# Patient Record
Sex: Female | Born: 1945 | Race: White | Hispanic: No | Marital: Married | Smoking: Never smoker
Health system: Southern US, Community
[De-identification: ages and names within clinical notes are randomized; demographics above are authoritative.]

## PROBLEM LIST (undated history)

## (undated) DIAGNOSIS — M1990 Unspecified osteoarthritis, unspecified site: Secondary | ICD-10-CM

## (undated) DIAGNOSIS — F0390 Unspecified dementia without behavioral disturbance: Secondary | ICD-10-CM

## (undated) DIAGNOSIS — I5031 Acute diastolic (congestive) heart failure: Secondary | ICD-10-CM

## (undated) DIAGNOSIS — T7840XA Allergy, unspecified, initial encounter: Secondary | ICD-10-CM

## (undated) DIAGNOSIS — R600 Localized edema: Secondary | ICD-10-CM

## (undated) DIAGNOSIS — J309 Allergic rhinitis, unspecified: Secondary | ICD-10-CM

## (undated) DIAGNOSIS — I10 Essential (primary) hypertension: Secondary | ICD-10-CM

## (undated) DIAGNOSIS — E079 Disorder of thyroid, unspecified: Secondary | ICD-10-CM

## (undated) DIAGNOSIS — I351 Nonrheumatic aortic (valve) insufficiency: Secondary | ICD-10-CM

## (undated) DIAGNOSIS — J9602 Acute respiratory failure with hypercapnia: Principal | ICD-10-CM

## (undated) DIAGNOSIS — H40009 Preglaucoma, unspecified, unspecified eye: Secondary | ICD-10-CM

## (undated) DIAGNOSIS — J9601 Acute respiratory failure with hypoxia: Principal | ICD-10-CM

## (undated) DIAGNOSIS — E669 Obesity, unspecified: Secondary | ICD-10-CM

## (undated) DIAGNOSIS — E119 Type 2 diabetes mellitus without complications: Secondary | ICD-10-CM

## (undated) DIAGNOSIS — M25569 Pain in unspecified knee: Secondary | ICD-10-CM

## (undated) DIAGNOSIS — I359 Nonrheumatic aortic valve disorder, unspecified: Secondary | ICD-10-CM

## (undated) DIAGNOSIS — I509 Heart failure, unspecified: Secondary | ICD-10-CM

## (undated) DIAGNOSIS — I428 Other cardiomyopathies: Secondary | ICD-10-CM

## (undated) DIAGNOSIS — R7303 Prediabetes: Secondary | ICD-10-CM

## (undated) DIAGNOSIS — I739 Peripheral vascular disease, unspecified: Secondary | ICD-10-CM

## (undated) DIAGNOSIS — R0609 Other forms of dyspnea: Secondary | ICD-10-CM

## (undated) DIAGNOSIS — C801 Malignant (primary) neoplasm, unspecified: Secondary | ICD-10-CM

## (undated) HISTORY — DX: Nonrheumatic aortic (valve) insufficiency: I35.1

## (undated) HISTORY — DX: Type 2 diabetes mellitus without complications: E11.9

## (undated) HISTORY — DX: Unspecified dementia without behavioral disturbance: F03.90

## (undated) HISTORY — DX: Essential (primary) hypertension: I10

## (undated) HISTORY — DX: Acute respiratory failure with hypercapnia: J96.02

## (undated) HISTORY — DX: Allergy, unspecified, initial encounter: T78.40XA

## (undated) HISTORY — DX: Localized edema: R60.0

## (undated) HISTORY — DX: Disorder of thyroid, unspecified: E07.9

## (undated) HISTORY — DX: Nonrheumatic aortic valve disorder, unspecified: I35.9

## (undated) HISTORY — DX: Acute respiratory failure with hypoxia: J96.01

## (undated) HISTORY — DX: Allergic rhinitis, unspecified: J30.9

## (undated) HISTORY — DX: Other forms of dyspnea: R06.09

## (undated) HISTORY — DX: Heart failure, unspecified: I50.9

## (undated) HISTORY — DX: Obesity, unspecified: E66.9

## (undated) HISTORY — DX: Pain in unspecified knee: M25.569

## (undated) HISTORY — DX: Acute diastolic (congestive) heart failure: I50.31

## (undated) HISTORY — DX: Prediabetes: R73.03

## (undated) HISTORY — DX: Preglaucoma, unspecified, unspecified eye: H40.009

---

## 2001-06-12 ENCOUNTER — Other Ambulatory Visit: Admission: RE | Admit: 2001-06-12 | Discharge: 2001-06-12 | Payer: Self-pay | Admitting: Family Medicine

## 2005-01-03 ENCOUNTER — Ambulatory Visit: Payer: Self-pay | Admitting: Family Medicine

## 2005-02-15 ENCOUNTER — Ambulatory Visit: Payer: Self-pay | Admitting: Family Medicine

## 2005-02-22 ENCOUNTER — Ambulatory Visit: Payer: Self-pay | Admitting: Family Medicine

## 2005-02-22 ENCOUNTER — Other Ambulatory Visit: Admission: RE | Admit: 2005-02-22 | Discharge: 2005-02-22 | Payer: Self-pay | Admitting: Family Medicine

## 2005-02-26 ENCOUNTER — Ambulatory Visit: Payer: Self-pay

## 2005-03-06 ENCOUNTER — Ambulatory Visit: Payer: Self-pay

## 2006-04-17 ENCOUNTER — Ambulatory Visit: Payer: Self-pay | Admitting: Family Medicine

## 2006-04-24 ENCOUNTER — Encounter: Payer: Self-pay | Admitting: Family Medicine

## 2006-04-24 ENCOUNTER — Other Ambulatory Visit: Admission: RE | Admit: 2006-04-24 | Discharge: 2006-04-24 | Payer: Self-pay | Admitting: Family Medicine

## 2006-04-24 ENCOUNTER — Ambulatory Visit: Payer: Self-pay | Admitting: Family Medicine

## 2007-06-24 ENCOUNTER — Ambulatory Visit: Payer: Self-pay | Admitting: Family Medicine

## 2007-06-24 LAB — CONVERTED CEMR LAB
BUN: 14 mg/dL (ref 6–23)
Basophils Relative: 0.2 % (ref 0.0–1.0)
Bilirubin Urine: NEGATIVE
Bilirubin, Direct: 0.2 mg/dL (ref 0.0–0.3)
CO2: 30 meq/L (ref 19–32)
Cholesterol: 176 mg/dL (ref 0–200)
Creatinine,U: 63.5 mg/dL
GFR calc Af Amer: 109 mL/min
Glucose, Bld: 122 mg/dL — ABNORMAL HIGH (ref 70–99)
HDL: 37 mg/dL — ABNORMAL LOW (ref >39.0)
Hemoglobin: 14.3 g/dL (ref 12.0–15.0)
Ketones, urine, test strip: NEGATIVE
Lymphocytes Relative: 21.6 % (ref 12.0–46.0)
Microalb Creat Ratio: 3.1 mg/g (ref 0.0–30.0)
Microalb, Ur: 0.2 mg/dL (ref 0.0–1.9)
Monocytes Absolute: 0.4 10*3/uL (ref 0.2–0.7)
Monocytes Relative: 8.7 % (ref 3.0–11.0)
Neutro Abs: 3.3 10*3/uL (ref 1.4–7.7)
Neutrophils Relative %: 65.8 % (ref 43.0–77.0)
Nitrite: NEGATIVE
Potassium: 4.1 meq/L (ref 3.5–5.1)
Sodium: 145 meq/L (ref 135–145)
Total Protein: 6.9 g/dL (ref 6.0–8.3)
VLDL: 30 mg/dL (ref 0–40)
pH: 5.5

## 2007-07-01 ENCOUNTER — Encounter: Payer: Self-pay | Admitting: Family Medicine

## 2007-07-01 ENCOUNTER — Other Ambulatory Visit: Admission: RE | Admit: 2007-07-01 | Discharge: 2007-07-01 | Payer: Self-pay | Admitting: Family Medicine

## 2007-07-01 ENCOUNTER — Ambulatory Visit: Payer: Self-pay | Admitting: Family Medicine

## 2007-07-01 DIAGNOSIS — I10 Essential (primary) hypertension: Secondary | ICD-10-CM

## 2007-07-01 DIAGNOSIS — J309 Allergic rhinitis, unspecified: Secondary | ICD-10-CM | POA: Insufficient documentation

## 2007-07-01 DIAGNOSIS — E669 Obesity, unspecified: Secondary | ICD-10-CM

## 2007-07-01 DIAGNOSIS — I359 Nonrheumatic aortic valve disorder, unspecified: Secondary | ICD-10-CM

## 2007-07-01 DIAGNOSIS — E0590 Thyrotoxicosis, unspecified without thyrotoxic crisis or storm: Secondary | ICD-10-CM | POA: Insufficient documentation

## 2007-07-01 HISTORY — DX: Nonrheumatic aortic valve disorder, unspecified: I35.9

## 2007-07-01 HISTORY — DX: Obesity, unspecified: E66.9

## 2007-07-01 HISTORY — DX: Essential (primary) hypertension: I10

## 2007-07-01 HISTORY — DX: Allergic rhinitis, unspecified: J30.9

## 2007-07-03 ENCOUNTER — Telehealth: Payer: Self-pay | Admitting: Family Medicine

## 2009-01-04 ENCOUNTER — Telehealth: Payer: Self-pay | Admitting: Family Medicine

## 2009-02-08 ENCOUNTER — Ambulatory Visit: Payer: Self-pay | Admitting: Family Medicine

## 2009-02-08 LAB — CONVERTED CEMR LAB
ALT: 17 units/L (ref 0–35)
AST: 22 units/L (ref 0–37)
Alkaline Phosphatase: 74 units/L (ref 39–117)
BUN: 13 mg/dL (ref 6–23)
Basophils Absolute: 0 10*3/uL (ref 0.0–0.1)
Blood in Urine, dipstick: NEGATIVE
Calcium: 9.2 mg/dL (ref 8.4–10.5)
Creatinine,U: 37 mg/dL
Eosinophils Relative: 3.3 % (ref 0.0–5.0)
GFR calc non Af Amer: 106.93 mL/min (ref >60)
HCT: 39.5 % (ref 36.0–46.0)
HDL: 43.8 mg/dL (ref >39.00)
Hemoglobin: 13.9 g/dL (ref 12.0–15.0)
LDL Cholesterol: 115 mg/dL — ABNORMAL HIGH (ref 0–99)
Lymphocytes Relative: 22.6 % (ref 12.0–46.0)
Lymphs Abs: 1 10*3/uL (ref 0.7–4.0)
Microalb Creat Ratio: 8.1 mg/g (ref 0.0–30.0)
Monocytes Relative: 7.9 % (ref 3.0–12.0)
Platelets: 156 10*3/uL (ref 150.0–400.0)
Potassium: 4 meq/L (ref 3.5–5.1)
Protein, U semiquant: NEGATIVE
RDW: 12.4 % (ref 11.5–14.6)
Sodium: 142 meq/L (ref 135–145)
TSH: 0.98 microintl units/mL (ref 0.35–5.50)
Total Bilirubin: 0.8 mg/dL (ref 0.3–1.2)
Urobilinogen, UA: 0.2
VLDL: 20.6 mg/dL (ref 0.0–40.0)
WBC: 4.4 10*3/uL — ABNORMAL LOW (ref 4.5–10.5)

## 2009-02-17 ENCOUNTER — Ambulatory Visit: Payer: Self-pay | Admitting: Family Medicine

## 2009-02-17 ENCOUNTER — Encounter: Payer: Self-pay | Admitting: Family Medicine

## 2009-02-17 ENCOUNTER — Other Ambulatory Visit: Admission: RE | Admit: 2009-02-17 | Discharge: 2009-02-17 | Payer: Self-pay | Admitting: Family Medicine

## 2010-03-09 ENCOUNTER — Telehealth: Payer: Self-pay | Admitting: Family Medicine

## 2010-04-06 ENCOUNTER — Other Ambulatory Visit: Admission: RE | Admit: 2010-04-06 | Discharge: 2010-04-06 | Payer: Self-pay | Admitting: Family Medicine

## 2010-04-06 ENCOUNTER — Ambulatory Visit: Payer: Self-pay | Admitting: Family Medicine

## 2010-04-06 DIAGNOSIS — M25569 Pain in unspecified knee: Secondary | ICD-10-CM

## 2010-04-06 HISTORY — DX: Pain in unspecified knee: M25.569

## 2010-09-16 LAB — CONVERTED CEMR LAB
Albumin: 4.6 g/dL (ref 3.5–5.2)
Alkaline Phosphatase: 73 units/L (ref 39–117)
BUN: 14 mg/dL (ref 6–23)
Basophils Absolute: 0 10*3/uL (ref 0.0–0.1)
CO2: 29 meq/L (ref 19–32)
Calcium: 9.9 mg/dL (ref 8.4–10.5)
Cholesterol: 187 mg/dL (ref 0–200)
Creatinine, Ser: 0.6 mg/dL (ref 0.4–1.2)
Eosinophils Absolute: 0 10*3/uL (ref 0.0–0.7)
GFR calc non Af Amer: 106.54 mL/min (ref >60)
Glucose, Bld: 122 mg/dL — ABNORMAL HIGH (ref 70–99)
HDL: 42.5 mg/dL (ref >39.00)
Hemoglobin: 15.1 g/dL — ABNORMAL HIGH (ref 12.0–15.0)
Lymphocytes Relative: 14.6 % (ref 12.0–46.0)
MCHC: 35.2 g/dL (ref 30.0–36.0)
Monocytes Relative: 7.5 % (ref 3.0–12.0)
Neutro Abs: 5.1 10*3/uL (ref 1.4–7.7)
Platelets: 204 10*3/uL (ref 150.0–400.0)
RDW: 13.3 % (ref 11.5–14.6)
TSH: 0.99 microintl units/mL (ref 0.35–5.50)
Total Bilirubin: 0.7 mg/dL (ref 0.3–1.2)
Triglycerides: 176 mg/dL — ABNORMAL HIGH (ref 0.0–149.0)
VLDL: 35.2 mg/dL (ref 0.0–40.0)

## 2010-09-18 NOTE — Assessment & Plan Note (Signed)
Summary: emp/pt fasting/cjr   Vital Signs:  Patient profile:   65 year old female Menstrual status:  postmenopausal Height:      63 inches Weight:      198 pounds BMI:     35.20 Temp:     98.4 degrees F oral BP sitting:   170 / 100  (left arm) Cuff size:   regular  Vitals Entered By: Kern Reap CMA Duncan Dull) (April 06, 2010 10:36 AM) CC: cpx Is Patient Diabetic? No Pain Assessment Patient in pain? no        CC:  cpx.  History of Present Illness: Charlene Rubio is a 65 year old, married female, nonsmoker, who comes in today for evaluation of multiple issues.  She has underlying hypertension, for which she takes enalapril 80 mg daily, hydrochlorothiazide, 25 mg daily.  She also takes an 81-mg baby aspirin.it's very difficult to get a coherent history.  She keeps jumping from one topic to the next  She takes Claritin 10 mg daily for allergic rhinitis and uses Premarin vaginal cream twice weekly for vaginal dryness.  Tetanus 2001.  Booster today....... decline seasonal flu shot, Pneumovax 2010, colonoscopy 2005.  She declines any further screening colonoscopies because the friend had a perforation.  She gets routine eye care.  Dental care.  Checks her breasts monthly, but declines any mammography.  Last mammogram 2002.  She is now having trouble with swelling of her left leg more than her right. she states that she fell 6 weeks ago on a piece of concrete and injured her knee.  No history of DVT.  Her husband is also concerned about her hearing.  She declines any hearing evaluation.  BP at home 125/75.  BP here 170 over hundred.  She states she hates to coming here.  She gets nervous and her blood pressure goes up.  Here for Medicare AWV:  1.   Risk factors based on Past M, S, F history:reviewed.  No change except for noted above 2.   Physical Activities: ...Marland KitchenMarland KitchenMarland Kitchenno routine exercise 3.   Depression/mood: ........mood good.  No depression 4.   Hearing: .........Marland Kitchenhusband says she can hear and  she declines.  Hearing evaluation 5.   ADL's: .......unchanged 6.   Fall Risk: reviewed none 7.   Home Safety: reviewed normal 8.   Height, weight, &visual acuity:height weight, normal 9.   Counseling: diet, exercise, weight loss audiogram.  Annual mammography 10.   Labs ordered based on risk factors: done today 11.           Referral Coordination.........none indicated 12.           Care Plan......Marland Kitchenreviewed in detail 13.            Cognitive Assessment .......normal mentation  Allergies: No Known Drug Allergies  Past History:  Past medical, surgical, family and social histories (including risk factors) reviewed, and no changes noted (except as noted below).  Past Medical History: Reviewed history from 07/01/2007 and no changes required. Allergic rhinitis Hyperthyroidism aortic insufficiency obesity postmenopausal vaginal dryness childbirth x 3  Family History: Reviewed history from 07/01/2007 and no changes required. Family History of CAD Female 1st degree relative <60 Family History Hypertension  Social History: Reviewed history from 07/01/2007 and no changes required. Married Never Smoked Alcohol use-no Drug use-no Regular exercise-no  Review of Systems      See HPI  Physical Exam  General:  Well-developed,well-nourished,in no acute distress; alert,appropriate and cooperative throughout examination Head:  Normocephalic and atraumatic without obvious abnormalities. No  apparent alopecia or balding. Eyes:  No corneal or conjunctival inflammation noted. EOMI. Perrla. Funduscopic exam benign, without hemorrhages, exudates or papilledema. Vision grossly normal. Ears:  External ear exam shows no significant lesions or deformities.  Otoscopic examination reveals clear canals, tympanic membranes are intact bilaterally without bulging, retraction, inflammation or discharge. Hearing is grossly normal bilaterally. Nose:  External nasal examination shows no deformity or  inflammation. Nasal mucosa are pink and moist without lesions or exudates. Mouth:  Oral mucosa and oropharynx without lesions or exudates.  Teeth in good repair. Neck:  No deformities, masses, or tenderness noted. Chest Wall:  No deformities, masses, or tenderness noted. Breasts:  No mass, nodules, thickening, tenderness, bulging, retraction, inflamation, nipple discharge or skin changes noted.   Lungs:  cardiac exam negative, except for slight AI murmur, grade 2 Heart:  Normal rate and regular rhythm. S1 and S2 normal without gallop, murmur, click, rub or other extra sounds. Abdomen:  Bowel sounds positive,abdomen soft and non-tender without masses, organomegaly or hernias noted. Rectal:  No external abnormalities noted. Normal sphincter tone. No rectal masses or tenderness. Genitalia:  Pelvic Exam:        External: normal female genitalia without lesions or masses        Vagina: normal without lesions or masses        Cervix: normal without lesions or masses        Adnexa: normal bimanual exam without masses or fullness        Uterus: normal by palpation        Pap smear: performed Msk:  No deformity or scoliosis noted of thoracic or lumbar spine.   Pulses:  arterial pulses normal.  There is 2+ edema left leg, trace in the right.  Unable to bend her left knee Extremities:  No clubbing, cyanosis, edema, or deformity noted with normal full range of motion of all joints.   Neurologic:  No cranial nerve deficits noted. Station and gait are normal. Plantar reflexes are down-going bilaterally. DTRs are symmetrical throughout. Sensory, motor and coordinative functions appear intact. Skin:  Intact without suspicious lesions or rashes Cervical Nodes:  No lymphadenopathy noted Axillary Nodes:  No palpable lymphadenopathy Inguinal Nodes:  No significant adenopathy Psych:  difficulty to get a coherent history.  She keeps jumping from one topic to the neck   Impression & Recommendations:  Problem #  1:  OBESITY, UNSPECIFIED (ICD-278.00) Assessment Unchanged  Orders: Venipuncture (14782) TLB-Lipid Panel (80061-LIPID) TLB-BMP (Basic Metabolic Panel-BMET) (80048-METABOL) TLB-CBC Platelet - w/Differential (85025-CBCD) TLB-Hepatic/Liver Function Pnl (80076-HEPATIC) TLB-TSH (Thyroid Stimulating Hormone) (95621-HYQ) Prescription Created Electronically [phone]) Medicare -1st Annual Wellness Visit [phone]) Urinalysis-dipstick only (Medicare patient) (52841LK)  Problem # 2:  HYPERTENSION NEC (ICD-997.91) Assessment: Improved  Orders: Venipuncture (44010) TLB-Lipid Panel (80061-LIPID) TLB-BMP (Basic Metabolic Panel-BMET) (80048-METABOL) TLB-CBC Platelet - w/Differential (85025-CBCD) TLB-Hepatic/Liver Function Pnl (80076-HEPATIC) TLB-TSH (Thyroid Stimulating Hormone) (27253-GUY) Prescription Created Electronically [phone]) Medicare -1st Annual Wellness Visit [phone]) Urinalysis-dipstick only (Medicare patient) (63875IE)  Problem # 3:  AORTIC INSUFFICIENCY (ICD-424.1) Assessment: Unchanged  Her updated medication list for this problem includes:    Aspir-low 81 Mg Tbec (Aspirin) ..... Once daily  Orders: Venipuncture (33295) TLB-Lipid Panel (80061-LIPID) TLB-BMP (Basic Metabolic Panel-BMET) (80048-METABOL) TLB-CBC Platelet - w/Differential (85025-CBCD) TLB-Hepatic/Liver Function Pnl (80076-HEPATIC) TLB-TSH (Thyroid Stimulating Hormone) (18841-YSA) Prescription Created Electronically [phone]) Medicare -1st Annual Wellness Visit [phone]) Urinalysis-dipstick only (Medicare patient) ([id number])  Problem # 4:  PHYSICAL EXAMINATION (ICD-V70.0) Assessment: Unchanged  Orders: Venipuncture (20254) TLB-Lipid Panel (80061-LIPID) TLB-BMP (Basic Metabolic Panel-BMET) (80048-METABOL)  TLB-CBC Platelet - w/Differential (85025-CBCD) TLB-Hepatic/Liver Function Pnl (80076-HEPATIC) TLB-TSH (Thyroid Stimulating Hormone) ([phone]) Prescription Created Electronically [phone]) Medicare -1st Annual  Wellness Visit [phone]) Urinalysis-dipstick only (Medicare patient) (78295AO)  Problem # 5:  KNEE PAIN, LEFT (ICD-719.46) Assessment: New  Her updated medication list for this problem includes:    Aspir-low 81 Mg Tbec (Aspirin) ..... Once daily  Orders: Venipuncture (13086) TLB-Lipid Panel (80061-LIPID) TLB-BMP (Basic Metabolic Panel-BMET) (80048-METABOL) TLB-CBC Platelet - w/Differential (85025-CBCD) TLB-Hepatic/Liver Function Pnl (80076-HEPATIC) TLB-TSH (Thyroid Stimulating Hormone) (57846-NGE) Prescription Created Electronically [phone]) Medicare -1st Annual Wellness Visit [phone]) Urinalysis-dipstick only (Medicare patient) (01027OZ)  Complete Medication List: 1)  Quinapril Hcl 40 Mg Tabs (Quinapril hcl) .... 2 by mouth qam 2)  Hydrochlorothiazide 25 Mg Tabs (Hydrochlorothiazide) .... Once daily 3)  Premarin 0.625 Mg/gm Crea (Estrogens, conjugated) .... Q wk 4)  Aspir-low 81 Mg Tbec (Aspirin) .... Once daily 5)  Claritin 10 Mg Tabs (Loratadine) .... Once daily as needed  Patient Instructions: 1)  call today to set up for your mammogram. 2)  Call Dr. Derrill Kay for evaluation of your left knee. 3)  Return to see me in one month for follow-up. 4)  Check your blood pressure weekly. 5)  Walk 30 minutes daily 6)  Take an Aspirin every day. Prescriptions: PREMARIN 0.625 MG/GM  CREA (ESTROGENS, CONJUGATED) Q WK  #3 tubes x 4   Entered and Authorized by:   Roderick Pee MD   Signed by:   Roderick Pee MD on 04/06/2010   Method used:   Electronically to        [facility]. [phone]* (retail)       [address]       [place], [place]  [address]       Ph: [phone]       Fax: [phone]   RxID:   [id number] HYDROCHLOROTHIAZIDE 25 MG  TABS (HYDROCHLOROTHIAZIDE) once daily  #100 x 3   Entered and Authorized by:   Roderick Pee MD   Signed by:   Roderick Pee MD on 04/06/2010   Method used:   Electronically to        [facility]. [phone]* (retail)       [address]       [place], [place]  [address]       Ph: [phone]       Fax: [phone]   RxID:   [id number] QUINAPRIL HCL 40 MG  TABS (QUINAPRIL HCL) 2 by mouth qam  #200 x 3   Entered and Authorized by:   Roderick Pee MD   Signed by:   Roderick Pee MD on 04/06/2010   Method used:   Electronically to        [facility]. [phone]* (retail)       [address]       [place], [place]  [address]       Ph: [phone]       Fax: [phone]   RxID:   [id number]     Appended Document: Orders Update    Clinical Lists Changes  Orders: Added new Service order of Venipuncture (44315) - Signed Added new Service order of Specimen Handling (40086) - Signed      Appended Document: emp/pt fasting/cjr     Allergies: No Known Drug Allergies  Complete Medication List: 1)  Quinapril Hcl 40 Mg Tabs (Quinapril hcl) .... 2 by mouth qam 2)  Hydrochlorothiazide 25 Mg Tabs (Hydrochlorothiazide) .... Once daily 3)  Premarin 0.625 Mg/gm Crea (Estrogens, conjugated) .... Q wk 4)  Aspir-low 81 Mg Tbec (Aspirin) .... Once daily 5)  Claritin 10 Mg Tabs (Loratadine) .... Once daily as needed  Other Orders: Tdap => 13yrs IM (16109) Admin 1st Vaccine (60454) EKG w/ Interpretation (93000)    Immunizations Administered:  Tetanus Vaccine:    Vaccine Type: Tdap    Site: right deltoid    Mfr: GlaxoSmithKline    Dose: 0.5 ml    Route: IM    Given by: Kern Reap CMA (AAMA)    Exp. Date: 06/07/2012    Lot #: [id number]    VIS given: 07/07/07 version given April 06, 2010.    Physician counseled: yes  Appended Document: emp/pt fasting/cjr     Allergies: No Known Drug Allergies   Complete Medication List: 1)  Quinapril Hcl 40 Mg Tabs (Quinapril hcl) .... 2 by mouth qam 2)  Hydrochlorothiazide 25 Mg Tabs (Hydrochlorothiazide) .... Once daily 3)  Premarin 0.625  Mg/gm Crea (Estrogens, conjugated) .... Q wk 4)  Aspir-low 81 Mg Tbec (Aspirin) .... Once daily 5)  Claritin 10 Mg Tabs (Loratadine) .... Once daily as needed   Laboratory Results   Urine Tests  Date/Time Received: April 06, 2010   Routine Urinalysis   Color: yellow Appearance: Clear Glucose: negative   (Normal Range: Negative) Bilirubin: negative   (Normal Range: Negative) Ketone: negative   (Normal Range: Negative) Spec. Gravity: 1.015   (Normal Range: 1.003-1.035) Blood: trace-lysed   (Normal Range: Negative) pH: 5.0   (Normal Range: 5.0-8.0) Protein: negative   (Normal Range: Negative) Urobilinogen: 0.2   (Normal Range: 0-1) Nitrite: negative   (Normal Range: Negative) Leukocyte Esterace: moderate   (Normal Range: Negative)    Comments: Kern Reap CMA Duncan Dull)  April 06, 2010 5:12 PM

## 2010-09-18 NOTE — Progress Notes (Signed)
Summary: Pt sch cpx for 04/06/10. Req partial refill Quinapril and HCTZ  Phone Note Call from Patient Call back at Home Phone [phone]   Caller: Patient Summary of Call: Pt has a cpx sch for 04/06/10 at 10:30am and is req partial refill of Quinapril 50mg  and HCTZ 25mg , to last until her appt date. Pls call in partial to [facility] [address]. Pls do this today. Pt out of meds.     Initial call taken by: Lucy Antigua,  March 09, 2010 10:39 AM    Prescriptions: HYDROCHLOROTHIAZIDE 25 MG  TABS (HYDROCHLOROTHIAZIDE) once daily  #30 x 0   Entered by:   Kern Reap CMA (AAMA)   Authorized by:   Roderick Pee MD   Signed by:   Kern Reap CMA (AAMA) on 03/09/2010   Method used:   Electronically to        [facility] [phone]* (retail)       [address]       [place], [place]  [address]       Ph: [phone]       Fax: [phone]   RxID:   [id number] QUINAPRIL HCL 40 MG  TABS (QUINAPRIL HCL) 2 by mouth qam  #60 x 0   Entered by:   Kern Reap CMA (AAMA)   Authorized by:   Roderick Pee MD   Signed by:   Kern Reap CMA (AAMA) on 03/09/2010   Method used:   Electronically to        [facility] [phone]* (retail)       [address]       [place], [place]  [address]       Ph: [phone]       Fax: [phone]   RxID:   [id number]

## 2011-06-09 ENCOUNTER — Other Ambulatory Visit: Payer: Self-pay | Admitting: Family Medicine

## 2011-06-10 ENCOUNTER — Telehealth: Payer: Self-pay | Admitting: Family Medicine

## 2011-06-10 MED ORDER — QUINAPRIL HCL 40 MG PO TABS: 40.0000 mg | ORAL_TABLET | Freq: Every day | ORAL | Status: DC

## 2011-06-10 MED ORDER — HYDROCHLOROTHIAZIDE 25 MG PO TABS: 25.0000 mg | ORAL_TABLET | Freq: Every day | ORAL | Status: DC

## 2011-06-10 NOTE — Telephone Encounter (Signed)
Pt is scheduled for her physical on 08/19/11 and will be out of meds before then. Pt requesting refill on quinapril (ACCUPRIL) 40 MG tablet and hydrochlorothiazide (HYDRODIURIL) 25 MG tablet.  [facility] and [address]

## 2011-08-08 ENCOUNTER — Encounter: Payer: Self-pay | Admitting: Family Medicine

## 2011-08-16 ENCOUNTER — Encounter: Payer: Self-pay | Admitting: Family Medicine

## 2011-08-19 ENCOUNTER — Ambulatory Visit (INDEPENDENT_AMBULATORY_CARE_PROVIDER_SITE_OTHER): Payer: Medicare Other | Admitting: Family Medicine

## 2011-08-19 ENCOUNTER — Other Ambulatory Visit (HOSPITAL_COMMUNITY)
Admission: RE | Admit: 2011-08-19 | Discharge: 2011-08-19 | Disposition: A | Discharge status: Home / Self Care | Payer: Medicare Other | Source: Ambulatory Visit | Attending: Family Medicine | Admitting: Family Medicine

## 2011-08-19 ENCOUNTER — Encounter: Payer: Self-pay | Admitting: Family Medicine

## 2011-08-19 DIAGNOSIS — IMO0002 Reserved for concepts with insufficient information to code with codable children: Secondary | ICD-10-CM

## 2011-08-19 DIAGNOSIS — J309 Allergic rhinitis, unspecified: Secondary | ICD-10-CM

## 2011-08-19 DIAGNOSIS — Z0000 Encounter for general adult medical examination without abnormal findings: Secondary | ICD-10-CM

## 2011-08-19 DIAGNOSIS — Z23 Encounter for immunization: Secondary | ICD-10-CM

## 2011-08-19 DIAGNOSIS — Z01419 Encounter for gynecological examination (general) (routine) without abnormal findings: Secondary | ICD-10-CM | POA: Insufficient documentation

## 2011-08-19 DIAGNOSIS — I10 Essential (primary) hypertension: Secondary | ICD-10-CM

## 2011-08-19 DIAGNOSIS — E0590 Thyrotoxicosis, unspecified without thyrotoxic crisis or storm: Secondary | ICD-10-CM

## 2011-08-19 DIAGNOSIS — Z136 Encounter for screening for cardiovascular disorders: Secondary | ICD-10-CM

## 2011-08-19 LAB — TSH: TSH: 1.45 u[IU]/mL (ref 0.35–5.50)

## 2011-08-19 LAB — POCT URINALYSIS DIPSTICK
Glucose, UA: NEGATIVE
Ketones, UA: NEGATIVE
Protein, UA: NEGATIVE
Urobilinogen, UA: 0.2

## 2011-08-19 LAB — CBC WITH DIFFERENTIAL/PLATELET
Basophils Relative: 0.7 % (ref 0.0–3.0)
Eosinophils Absolute: 0.1 10*3/uL (ref 0.0–0.7)
Eosinophils Relative: 1.8 % (ref 0.0–5.0)
Lymphocytes Relative: 18 % (ref 12.0–46.0)
MCV: 93 fl (ref 78.0–100.0)
Monocytes Absolute: 0.4 10*3/uL (ref 0.1–1.0)
Neutrophils Relative %: 72 % (ref 43.0–77.0)
Platelets: 200 10*3/uL (ref 150.0–400.0)
RBC: 4.75 Mil/uL (ref 3.87–5.11)
WBC: 5.9 10*3/uL (ref 4.5–10.5)

## 2011-08-19 LAB — BASIC METABOLIC PANEL
BUN: 21 mg/dL (ref 6–23)
CO2: 24 mEq/L (ref 19–32)
Chloride: 103 mEq/L (ref 96–112)
Creatinine, Ser: 0.7 mg/dL (ref 0.4–1.2)
Potassium: 3.6 mEq/L (ref 3.5–5.1)

## 2011-08-19 LAB — HEPATIC FUNCTION PANEL
ALT: 19 U/L (ref 0–35)
AST: 23 U/L (ref 0–37)
Total Bilirubin: 0.7 mg/dL (ref 0.3–1.2)
Total Protein: 7.8 g/dL (ref 6.0–8.3)

## 2011-08-19 LAB — LIPID PANEL
Cholesterol: 193 mg/dL (ref 0–200)
HDL: 49 mg/dL (ref >39.00)
Triglycerides: 127 mg/dL (ref 0.0–149.0)
VLDL: 25.4 mg/dL (ref 0.0–40.0)

## 2011-08-19 MED ORDER — HYDROCHLOROTHIAZIDE 25 MG PO TABS: 25.0000 mg | ORAL_TABLET | Freq: Every day | ORAL | Status: DC

## 2011-08-19 MED ORDER — ESTROGENS, CONJUGATED 0.625 MG/GM VA CREA: 1.0000 g | TOPICAL_CREAM | Freq: Every day | VAGINAL | Status: DC

## 2011-08-19 MED ORDER — QUINAPRIL HCL 40 MG PO TABS: 40.0000 mg | ORAL_TABLET | Freq: Every day | ORAL | Status: DC

## 2011-08-19 NOTE — Patient Instructions (Signed)
Continue your current medications.  Check your blood pressure daily in the morning and return in two weeks with the data and the device.  Remember to stay on a salt free diet and walk 30 minutes daily

## 2011-08-19 NOTE — Progress Notes (Signed)
Subjective:    Patient ID: Charlene Rubio, female    DOB: [birth year], 65 y.o.   MRN: [id number]  HPI Jakaylah is a 65 year old, married female, nonsmoker, who comes in today for a Medicare wellness examination because of a history of hypertension, allergic rhinitis postmenopausal vaginal dryness, and a number of other issues.  Her blood pressure today is 200 over hundred.  Her pulse is a hundred and regular.  She states that when she comes in the office.  She gets nervous.  Her blood pressure goes up and her blood pressure at home is normal.  I then asked her what her blood pressure was at home.  She says she hasn't checked it in over a year.  She states she takes her medications on a regular basis.  She uses Premarin vaginal cream once or twice monthly for vaginal dryness.  Her antihypertensive medications are Hyzaar thiazide 25 mg daily, Accupril 40 mg daily.  She takes Claritin OTC for allergic rhinitis and a baby aspirin.  She states she has soreness on left side of her face and she had dental work done a year ago.  Referred back to her dentist for reevaluation.  Seasonal flu shot today, Pneumovax 2010, tetanus, 2011, information given on shingles.  She does not check her breasts on a monthly basis as and a mammogram in over 10 years.  She states she does not get mammograms because she doesn't feel lump.  I explained to her that screening mammography.  Will find breast cancer before, you can feel it and that we still recommend a yearly mammogram.  It is very difficult to try to get her history because when we ask her a question she Jumps  from one topic to another  She does not get regular eye care, states her hearing is normal, gets regular dental care, activities of daily living, sedentary.  She does not exercise on a regular basis.  Cognitive function and her husband does all her finances.  Home health safety reviewed.  New issues identified, no guns in the house, she does not have a  healthcare power of attorney.  No living will.  I suggested that they do that   Review of Systems  Constitutional: Negative.   HENT: Negative.   Eyes: Negative.   Respiratory: Negative.   Cardiovascular: Negative.   Gastrointestinal: Negative.   Genitourinary: Negative.   Musculoskeletal: Negative.   Neurological: Negative.   Hematological: Negative.   Psychiatric/Behavioral: Negative.        Objective:   Physical Exam  Constitutional: She appears well-developed and well-nourished.  HENT:  Head: Normocephalic and atraumatic.  Right Ear: External ear normal.  Left Ear: External ear normal.  Nose: Nose normal.  Mouth/Throat: Oropharynx is clear and moist.  Eyes: EOM are normal. Pupils are equal, round, and reactive to light.  Neck: Normal range of motion. Neck supple. No thyromegaly present.  Cardiovascular: Normal rate, regular rhythm, normal heart sounds and intact distal pulses.  Exam reveals no gallop and no friction rub.   No murmur heard. Pulmonary/Chest: Effort normal and breath sounds normal.  Abdominal: Soft. Bowel sounds are normal. She exhibits no distension and no mass. There is no tenderness. There is no rebound.  Genitourinary: Vagina normal and uterus normal. Guaiac negative stool. No vaginal discharge found.  Musculoskeletal: Normal range of motion.  Lymphadenopathy:    She has no cervical adenopathy.  Neurological: She is alert. She has normal reflexes. No cranial nerve deficit. She exhibits normal muscle tone.  Coordination normal.  Skin: Skin is warm and dry.  Psychiatric: She has a normal mood and affect. Her behavior is normal. Judgment and thought content normal.       Difficult to obtain a history because she jumps from one topic to another          Assessment & Plan:  Healthy female.  Hypertension, not at goal continue current medication.  BP check.  Q.a.m. Follow-up in two weeks.  Allergic rhinitis.  Continue current  medication.  Postmenopausal vaginal dryness.  Use the Premarin vaginal cream twice weekly.  I recommend she get routine eye care, we do see her dentist, get a mammogram on her regular basis, begin an exercise program

## 2011-10-30 ENCOUNTER — Other Ambulatory Visit (INDEPENDENT_AMBULATORY_CARE_PROVIDER_SITE_OTHER): Payer: Medicare Other

## 2011-10-30 DIAGNOSIS — E119 Type 2 diabetes mellitus without complications: Secondary | ICD-10-CM

## 2011-10-30 LAB — BASIC METABOLIC PANEL
BUN: 15 mg/dL (ref 6–23)
CO2: 27 mEq/L (ref 19–32)
Calcium: 10 mg/dL (ref 8.4–10.5)
GFR: 81.96 mL/min (ref >60.00)
Glucose, Bld: 156 mg/dL — ABNORMAL HIGH (ref 70–99)
Potassium: 3.5 mEq/L (ref 3.5–5.1)

## 2012-08-07 ENCOUNTER — Other Ambulatory Visit: Payer: Self-pay | Admitting: *Deleted

## 2012-08-07 DIAGNOSIS — IMO0002 Reserved for concepts with insufficient information to code with codable children: Secondary | ICD-10-CM

## 2012-08-07 MED ORDER — HYDROCHLOROTHIAZIDE 25 MG PO TABS: 25.0000 mg | ORAL_TABLET | Freq: Every day | ORAL | Status: DC

## 2012-09-30 ENCOUNTER — Other Ambulatory Visit: Payer: Self-pay | Admitting: *Deleted

## 2012-09-30 DIAGNOSIS — IMO0002 Reserved for concepts with insufficient information to code with codable children: Secondary | ICD-10-CM

## 2012-09-30 MED ORDER — QUINAPRIL HCL 40 MG PO TABS: 40.0000 mg | ORAL_TABLET | Freq: Every day | ORAL | Status: DC

## 2012-09-30 MED ORDER — HYDROCHLOROTHIAZIDE 25 MG PO TABS: 25.0000 mg | ORAL_TABLET | Freq: Every day | ORAL | Status: DC

## 2012-10-21 ENCOUNTER — Encounter: Payer: Medicare Other | Admitting: Family Medicine

## 2012-12-11 ENCOUNTER — Other Ambulatory Visit: Payer: Self-pay | Admitting: *Deleted

## 2012-12-11 DIAGNOSIS — IMO0002 Reserved for concepts with insufficient information to code with codable children: Secondary | ICD-10-CM

## 2012-12-11 MED ORDER — QUINAPRIL HCL 40 MG PO TABS: 40.0000 mg | ORAL_TABLET | Freq: Every day | ORAL | Status: DC

## 2012-12-23 ENCOUNTER — Ambulatory Visit (INDEPENDENT_AMBULATORY_CARE_PROVIDER_SITE_OTHER): Payer: Medicare Other | Admitting: Family Medicine

## 2012-12-23 ENCOUNTER — Encounter: Payer: Self-pay | Admitting: Family Medicine

## 2012-12-23 VITALS — BP 160/110 | Temp 98.4°F | Ht 62.5 in | Wt 194.0 lb

## 2012-12-23 DIAGNOSIS — IMO0002 Reserved for concepts with insufficient information to code with codable children: Secondary | ICD-10-CM

## 2012-12-23 DIAGNOSIS — Z0000 Encounter for general adult medical examination without abnormal findings: Secondary | ICD-10-CM

## 2012-12-23 DIAGNOSIS — I359 Nonrheumatic aortic valve disorder, unspecified: Secondary | ICD-10-CM

## 2012-12-23 DIAGNOSIS — H918X9 Other specified hearing loss, unspecified ear: Secondary | ICD-10-CM

## 2012-12-23 DIAGNOSIS — H6120 Impacted cerumen, unspecified ear: Secondary | ICD-10-CM

## 2012-12-23 DIAGNOSIS — I10 Essential (primary) hypertension: Secondary | ICD-10-CM

## 2012-12-23 DIAGNOSIS — Z23 Encounter for immunization: Secondary | ICD-10-CM

## 2012-12-23 DIAGNOSIS — H6123 Impacted cerumen, bilateral: Secondary | ICD-10-CM

## 2012-12-23 DIAGNOSIS — E669 Obesity, unspecified: Secondary | ICD-10-CM

## 2012-12-23 DIAGNOSIS — J309 Allergic rhinitis, unspecified: Secondary | ICD-10-CM

## 2012-12-23 LAB — BASIC METABOLIC PANEL
BUN: 15 mg/dL (ref 6–23)
CO2: 24 mEq/L (ref 19–32)
Calcium: 9.9 mg/dL (ref 8.4–10.5)
Creatinine, Ser: 0.6 mg/dL (ref 0.4–1.2)

## 2012-12-23 LAB — CBC WITH DIFFERENTIAL/PLATELET
Basophils Absolute: 0 10*3/uL (ref 0.0–0.1)
Eosinophils Absolute: 0 10*3/uL (ref 0.0–0.7)
MCHC: 34.5 g/dL (ref 30.0–36.0)
MCV: 91.4 fl (ref 78.0–100.0)
Monocytes Absolute: 0.5 10*3/uL (ref 0.1–1.0)
Neutrophils Relative %: 79.6 % — ABNORMAL HIGH (ref 43.0–77.0)
Platelets: 188 10*3/uL (ref 150.0–400.0)
RDW: 12.6 % (ref 11.5–14.6)
WBC: 6.7 10*3/uL (ref 4.5–10.5)

## 2012-12-23 LAB — POCT URINALYSIS DIPSTICK
Urobilinogen, UA: 0.2
pH, UA: 5

## 2012-12-23 LAB — TSH: TSH: 1.01 u[IU]/mL (ref 0.35–5.50)

## 2012-12-23 MED ORDER — HYDROCHLOROTHIAZIDE 25 MG PO TABS: 25.0000 mg | ORAL_TABLET | Freq: Every day | ORAL | Status: DC

## 2012-12-23 MED ORDER — ESTROGENS, CONJUGATED 0.625 MG/GM VA CREA: 1.0000 g | TOPICAL_CREAM | Freq: Every day | VAGINAL | Status: DC

## 2012-12-23 MED ORDER — QUINAPRIL HCL 40 MG PO TABS: 40.0000 mg | ORAL_TABLET | Freq: Every day | ORAL | Status: DC

## 2012-12-23 NOTE — Patient Instructions (Signed)
Do a thorough breast exam monthly and call and get set up for your mammogram  Walk 30 minutes daily  We will set you up for a screening colonoscopy  Get a digital pump up blood pressure cuff,,,,,,,.omron........Marland Kitchenyou can purchase this off the Internet.......Marland Kitchen [employer]  Check your blood pressure daily in the morning  Return in 3 weeks for removal of the lesion we discussed and at that juncture bring a record of all your blood pressure readings and the new blood pressure device

## 2012-12-23 NOTE — Progress Notes (Signed)
  Subjective:    Patient ID: Charlene Rubio, female    DOB: 04-02-1946, 67 y.o.   MRN: [id number]  HPI Charlene Rubio is a 67 year old married female nonsmoker who comes in today for a Medicare wellness examination because of a history of allergic rhinitis, post menopausal vaginal dryness, mild hypertension  Her medications reviewed the been no changes. She takes over-the-counter Zyrtec or Claritin for allergic rhinitis  Her last Pap smear was 2 years ago normal. She uses Premarin vaginal cream for postmenopausal vaginal dryness. Recommend Pap smear every 3 years  She gets routine eye care, dental care, she states she checks her breasts monthly, never had a mammogram, never had a colonoscopy. She said years ago Dr. Idell Pickles used a short scope in the office. I explained her that that's a flexible sigmoidoscopy and not a colonoscopy. She denies any change in bowel habits rectal bleeding etc. Etc.  She gets a seasonal flu shot, Pneumovax 2010 booster today, tetanus 2011, information given on shingles.  She does not exercise on regular basis, home health safety reviewed no issues identified, no guns in the house, she does have a health care power of attorney and living well   Review of Systems  Constitutional: Negative.   HENT: Negative.   Eyes: Negative.   Respiratory: Negative.   Cardiovascular: Negative.   Gastrointestinal: Negative.   Genitourinary: Negative.   Musculoskeletal: Negative.   Neurological: Negative.   Psychiatric/Behavioral: Negative.        Objective:   Physical Exam  Constitutional: She appears well-developed and well-nourished.  HENT:  Head: Normocephalic and atraumatic.  Right Ear: External ear normal.  Left Ear: External ear normal.  Nose: Nose normal.  Mouth/Throat: Oropharynx is clear and moist.  Bilateral cerumen impactions  Eyes: EOM are normal. Pupils are equal, round, and reactive to light.  Neck: Normal range of motion. Neck supple. No thyromegaly present.   Cardiovascular: Normal rate, regular rhythm, normal heart sounds and intact distal pulses.  Exam reveals no gallop and no friction rub.   No murmur heard. No audible murmur  Pulmonary/Chest: Effort normal and breath sounds normal.  Abdominal: Soft. Bowel sounds are normal. She exhibits no distension and no mass. There is no tenderness. There is no rebound.  Genitourinary: Guaiac negative stool.  Rectal exam shows normal rectum. Digital rectal exam shows no couple masses brown stool guaiac negative  Bilateral breast exam normal  Musculoskeletal: Normal range of motion.  Lymphadenopathy:    She has no cervical adenopathy.  Neurological: She is alert. She has normal reflexes. No cranial nerve deficit. She exhibits normal muscle tone. Coordination normal.  Skin: Skin is warm and dry.  Total body skin exam normal except for a black lesion abdomen right upper quadrant return for removal  Psychiatric: She has a normal mood and affect. Her behavior is normal. Judgment and thought content normal.          Assessment & Plan:  Healthy female  Hypertension question at goal plan BP check daily followup in 2 weeks  Postmenopausal vaginal dryness continue Premarin vaginal cream  Allergic rhinitis Zyrtec 10 mg each bedtime  Recommend she get an annual mammogram and a colonoscopy

## 2013-01-12 ENCOUNTER — Encounter: Payer: Self-pay | Admitting: *Deleted

## 2013-12-23 ENCOUNTER — Other Ambulatory Visit: Payer: Self-pay | Admitting: Family Medicine

## 2014-06-27 ENCOUNTER — Encounter: Payer: Medicare Other | Admitting: Family Medicine

## 2014-06-27 DIAGNOSIS — Z0289 Encounter for other administrative examinations: Secondary | ICD-10-CM

## 2017-04-25 ENCOUNTER — Emergency Department (HOSPITAL_COMMUNITY): Payer: Medicare Other

## 2017-04-25 ENCOUNTER — Encounter: Payer: Self-pay | Admitting: Internal Medicine

## 2017-04-25 ENCOUNTER — Inpatient Hospital Stay (HOSPITAL_COMMUNITY)
Admission: EM | Admit: 2017-04-25 | Discharge: 2017-05-01 | DRG: 291 | Disposition: A | Discharge status: Skilled Nursing Facility | Payer: Medicare Other | Attending: Family Medicine | Admitting: Family Medicine

## 2017-04-25 ENCOUNTER — Ambulatory Visit (INDEPENDENT_AMBULATORY_CARE_PROVIDER_SITE_OTHER): Payer: Medicare Other | Admitting: Internal Medicine

## 2017-04-25 VITALS — BP 168/80 | HR 116 | Temp 98.2°F | Resp 20 | Ht 60.0 in | Wt 169.0 lb

## 2017-04-25 DIAGNOSIS — I359 Nonrheumatic aortic valve disorder, unspecified: Secondary | ICD-10-CM | POA: Diagnosis not present

## 2017-04-25 DIAGNOSIS — H40009 Preglaucoma, unspecified, unspecified eye: Secondary | ICD-10-CM | POA: Diagnosis not present

## 2017-04-25 DIAGNOSIS — Z7401 Bed confinement status: Secondary | ICD-10-CM | POA: Diagnosis not present

## 2017-04-25 DIAGNOSIS — E669 Obesity, unspecified: Secondary | ICD-10-CM | POA: Diagnosis present

## 2017-04-25 DIAGNOSIS — N179 Acute kidney failure, unspecified: Secondary | ICD-10-CM | POA: Diagnosis not present

## 2017-04-25 DIAGNOSIS — M21959 Unspecified acquired deformity of unspecified thigh: Secondary | ICD-10-CM | POA: Diagnosis not present

## 2017-04-25 DIAGNOSIS — I351 Nonrheumatic aortic (valve) insufficiency: Secondary | ICD-10-CM | POA: Diagnosis not present

## 2017-04-25 DIAGNOSIS — I110 Hypertensive heart disease with heart failure: Secondary | ICD-10-CM | POA: Diagnosis not present

## 2017-04-25 DIAGNOSIS — R609 Edema, unspecified: Secondary | ICD-10-CM | POA: Diagnosis not present

## 2017-04-25 DIAGNOSIS — I471 Supraventricular tachycardia: Secondary | ICD-10-CM | POA: Diagnosis not present

## 2017-04-25 DIAGNOSIS — J9811 Atelectasis: Secondary | ICD-10-CM | POA: Diagnosis not present

## 2017-04-25 DIAGNOSIS — R0609 Other forms of dyspnea: Secondary | ICD-10-CM | POA: Diagnosis not present

## 2017-04-25 DIAGNOSIS — R600 Localized edema: Secondary | ICD-10-CM

## 2017-04-25 DIAGNOSIS — I509 Heart failure, unspecified: Secondary | ICD-10-CM | POA: Diagnosis not present

## 2017-04-25 DIAGNOSIS — Z7189 Other specified counseling: Secondary | ICD-10-CM

## 2017-04-25 DIAGNOSIS — R918 Other nonspecific abnormal finding of lung field: Secondary | ICD-10-CM | POA: Diagnosis not present

## 2017-04-25 DIAGNOSIS — R627 Adult failure to thrive: Secondary | ICD-10-CM | POA: Diagnosis present

## 2017-04-25 DIAGNOSIS — E118 Type 2 diabetes mellitus with unspecified complications: Secondary | ICD-10-CM

## 2017-04-25 DIAGNOSIS — J9601 Acute respiratory failure with hypoxia: Secondary | ICD-10-CM | POA: Diagnosis present

## 2017-04-25 DIAGNOSIS — I340 Nonrheumatic mitral (valve) insufficiency: Secondary | ICD-10-CM | POA: Diagnosis not present

## 2017-04-25 DIAGNOSIS — F0390 Unspecified dementia without behavioral disturbance: Secondary | ICD-10-CM | POA: Diagnosis present

## 2017-04-25 DIAGNOSIS — E1122 Type 2 diabetes mellitus with diabetic chronic kidney disease: Secondary | ICD-10-CM | POA: Diagnosis present

## 2017-04-25 DIAGNOSIS — R0602 Shortness of breath: Secondary | ICD-10-CM | POA: Diagnosis not present

## 2017-04-25 DIAGNOSIS — E860 Dehydration: Secondary | ICD-10-CM | POA: Insufficient documentation

## 2017-04-25 DIAGNOSIS — N182 Chronic kidney disease, stage 2 (mild): Secondary | ICD-10-CM | POA: Diagnosis present

## 2017-04-25 DIAGNOSIS — I130 Hypertensive heart and chronic kidney disease with heart failure and stage 1 through stage 4 chronic kidney disease, or unspecified chronic kidney disease: Principal | ICD-10-CM | POA: Diagnosis present

## 2017-04-25 DIAGNOSIS — I5031 Acute diastolic (congestive) heart failure: Secondary | ICD-10-CM | POA: Diagnosis not present

## 2017-04-25 DIAGNOSIS — R410 Disorientation, unspecified: Secondary | ICD-10-CM | POA: Diagnosis not present

## 2017-04-25 DIAGNOSIS — E1165 Type 2 diabetes mellitus with hyperglycemia: Secondary | ICD-10-CM | POA: Diagnosis not present

## 2017-04-25 DIAGNOSIS — Z6833 Body mass index (BMI) 33.0-33.9, adult: Secondary | ICD-10-CM

## 2017-04-25 DIAGNOSIS — Z66 Do not resuscitate: Secondary | ICD-10-CM | POA: Diagnosis not present

## 2017-04-25 DIAGNOSIS — R262 Difficulty in walking, not elsewhere classified: Secondary | ICD-10-CM | POA: Diagnosis not present

## 2017-04-25 DIAGNOSIS — I5041 Acute combined systolic (congestive) and diastolic (congestive) heart failure: Secondary | ICD-10-CM | POA: Diagnosis present

## 2017-04-25 DIAGNOSIS — I10 Essential (primary) hypertension: Secondary | ICD-10-CM

## 2017-04-25 DIAGNOSIS — J9602 Acute respiratory failure with hypercapnia: Secondary | ICD-10-CM | POA: Diagnosis not present

## 2017-04-25 DIAGNOSIS — J9690 Respiratory failure, unspecified, unspecified whether with hypoxia or hypercapnia: Secondary | ICD-10-CM

## 2017-04-25 DIAGNOSIS — Z9119 Patient's noncompliance with other medical treatment and regimen: Secondary | ICD-10-CM

## 2017-04-25 DIAGNOSIS — Z9114 Patient's other noncompliance with medication regimen: Secondary | ICD-10-CM | POA: Diagnosis not present

## 2017-04-25 DIAGNOSIS — E876 Hypokalemia: Secondary | ICD-10-CM | POA: Diagnosis not present

## 2017-04-25 DIAGNOSIS — F064 Anxiety disorder due to known physiological condition: Secondary | ICD-10-CM | POA: Diagnosis present

## 2017-04-25 DIAGNOSIS — E079 Disorder of thyroid, unspecified: Secondary | ICD-10-CM | POA: Insufficient documentation

## 2017-04-25 DIAGNOSIS — R488 Other symbolic dysfunctions: Secondary | ICD-10-CM | POA: Diagnosis not present

## 2017-04-25 DIAGNOSIS — N6320 Unspecified lump in the left breast, unspecified quadrant: Secondary | ICD-10-CM | POA: Diagnosis not present

## 2017-04-25 DIAGNOSIS — R404 Transient alteration of awareness: Secondary | ICD-10-CM | POA: Insufficient documentation

## 2017-04-25 DIAGNOSIS — R0600 Dyspnea, unspecified: Secondary | ICD-10-CM

## 2017-04-25 DIAGNOSIS — Z23 Encounter for immunization: Secondary | ICD-10-CM

## 2017-04-25 DIAGNOSIS — R4589 Other symptoms and signs involving emotional state: Secondary | ICD-10-CM | POA: Insufficient documentation

## 2017-04-25 DIAGNOSIS — R51 Headache: Secondary | ICD-10-CM | POA: Diagnosis not present

## 2017-04-25 DIAGNOSIS — F418 Other specified anxiety disorders: Secondary | ICD-10-CM

## 2017-04-25 DIAGNOSIS — M6281 Muscle weakness (generalized): Secondary | ICD-10-CM | POA: Diagnosis not present

## 2017-04-25 DIAGNOSIS — Z91199 Patient's noncompliance with other medical treatment and regimen due to unspecified reason: Secondary | ICD-10-CM | POA: Insufficient documentation

## 2017-04-25 DIAGNOSIS — E119 Type 2 diabetes mellitus without complications: Secondary | ICD-10-CM

## 2017-04-25 DIAGNOSIS — D696 Thrombocytopenia, unspecified: Secondary | ICD-10-CM | POA: Diagnosis present

## 2017-04-25 DIAGNOSIS — I129 Hypertensive chronic kidney disease with stage 1 through stage 4 chronic kidney disease, or unspecified chronic kidney disease: Secondary | ICD-10-CM | POA: Diagnosis not present

## 2017-04-25 DIAGNOSIS — I82502 Chronic embolism and thrombosis of unspecified deep veins of left lower extremity: Secondary | ICD-10-CM | POA: Diagnosis not present

## 2017-04-25 DIAGNOSIS — R451 Restlessness and agitation: Secondary | ICD-10-CM | POA: Diagnosis not present

## 2017-04-25 HISTORY — DX: Other forms of dyspnea: R06.09

## 2017-04-25 HISTORY — DX: Heart failure, unspecified: I50.9

## 2017-04-25 HISTORY — DX: Acute respiratory failure with hypercapnia: J96.02

## 2017-04-25 HISTORY — DX: Localized edema: R60.0

## 2017-04-25 HISTORY — DX: Dyspnea, unspecified: R06.00

## 2017-04-25 HISTORY — DX: Type 2 diabetes mellitus without complications: E11.9

## 2017-04-25 HISTORY — DX: Acute respiratory failure with hypoxia: J96.01

## 2017-04-25 HISTORY — DX: Acute diastolic (congestive) heart failure: I50.31

## 2017-04-25 LAB — URINALYSIS, ROUTINE W REFLEX MICROSCOPIC
BACTERIA UA: NONE SEEN
Bilirubin Urine: NEGATIVE
Glucose, UA: >=500 mg/dL — AB
KETONES UR: NEGATIVE mg/dL
Leukocytes, UA: NEGATIVE
Nitrite: NEGATIVE
Specific Gravity, Urine: 1.022 (ref 1.005–1.030)
pH: 5 (ref 5.0–8.0)

## 2017-04-25 LAB — I-STAT ARTERIAL BLOOD GAS, ED
ACID-BASE DEFICIT: 2 mmol/L (ref 0.0–2.0)
BICARBONATE: 27.2 mmol/L (ref 20.0–28.0)
O2 Saturation: 100 %
PO2 ART: 471 mmHg — AB (ref 83.0–108.0)
TCO2: 29 mmol/L (ref 22–32)
pCO2 arterial: 65.2 mmHg (ref 32.0–48.0)
pH, Arterial: 7.229 — ABNORMAL LOW (ref 7.350–7.450)

## 2017-04-25 LAB — I-STAT VENOUS BLOOD GAS, ED
ACID-BASE EXCESS: 3 mmol/L — AB (ref 0.0–2.0)
BICARBONATE: 28.8 mmol/L — AB (ref 20.0–28.0)
O2 Saturation: 68 %
PO2 VEN: 36 mmHg (ref 32.0–45.0)
TCO2: 30 mmol/L (ref 22–32)
pCO2, Ven: 46.3 mmHg (ref 44.0–60.0)
pH, Ven: 7.401 (ref 7.250–7.430)

## 2017-04-25 LAB — CBC
HEMATOCRIT: 48.5 % — AB (ref 36.0–46.0)
HEMOGLOBIN: 16.1 g/dL — AB (ref 12.0–15.0)
MCH: 31.4 pg (ref 26.0–34.0)
MCHC: 33.2 g/dL (ref 30.0–36.0)
MCV: 94.7 fL (ref 78.0–100.0)
Platelets: 105 10*3/uL — ABNORMAL LOW (ref 150–400)
RBC: 5.12 MIL/uL — ABNORMAL HIGH (ref 3.87–5.11)
RDW: 15.4 % (ref 11.5–15.5)
WBC: 9.8 10*3/uL (ref 4.0–10.5)

## 2017-04-25 LAB — I-STAT TROPONIN, ED: TROPONIN I, POC: 0.31 ng/mL — AB (ref 0.00–0.08)

## 2017-04-25 LAB — POCT URINALYSIS DIPSTICK
BILIRUBIN UA: NEGATIVE
Glucose, UA: 250
KETONES UA: NEGATIVE
Nitrite, UA: NEGATIVE
PH UA: 6 (ref 5.0–8.0)
PROTEIN UA: 300
Spec Grav, UA: >=1.03 — AB (ref 1.010–1.025)
Urobilinogen, UA: 2 E.U./dL — AB

## 2017-04-25 LAB — COMPREHENSIVE METABOLIC PANEL
ALBUMIN: 3.9 g/dL (ref 3.5–5.0)
ALK PHOS: 63 U/L (ref 38–126)
ALT: 42 U/L (ref 14–54)
ANION GAP: 15 (ref 5–15)
AST: 33 U/L (ref 15–41)
BUN: 25 mg/dL — ABNORMAL HIGH (ref 6–20)
CALCIUM: 9 mg/dL (ref 8.9–10.3)
CHLORIDE: 105 mmol/L (ref 101–111)
CO2: 23 mmol/L (ref 22–32)
Creatinine, Ser: 1.22 mg/dL — ABNORMAL HIGH (ref 0.44–1.00)
GFR calc Af Amer: 50 mL/min — ABNORMAL LOW (ref >60)
GFR calc non Af Amer: 43 mL/min — ABNORMAL LOW (ref >60)
GLUCOSE: 428 mg/dL — AB (ref 65–99)
POTASSIUM: 3.7 mmol/L (ref 3.5–5.1)
SODIUM: 143 mmol/L (ref 135–145)
Total Bilirubin: 1.8 mg/dL — ABNORMAL HIGH (ref 0.3–1.2)
Total Protein: 6.1 g/dL — ABNORMAL LOW (ref 6.5–8.1)

## 2017-04-25 LAB — BRAIN NATRIURETIC PEPTIDE

## 2017-04-25 LAB — CREATININE, SERUM
CREATININE: 1.1 mg/dL — AB (ref 0.44–1.00)
GFR calc Af Amer: 57 mL/min — ABNORMAL LOW (ref >60)
GFR, EST NON AFRICAN AMERICAN: 49 mL/min — AB (ref >60)

## 2017-04-25 LAB — GLUCOSE, CAPILLARY: GLUCOSE-CAPILLARY: 310 mg/dL — AB (ref 65–99)

## 2017-04-25 LAB — I-STAT CG4 LACTIC ACID, ED: Lactic Acid, Venous: 3.12 mmol/L (ref 0.5–1.9)

## 2017-04-25 LAB — CBG MONITORING, ED: Glucose-Capillary: 386 mg/dL — ABNORMAL HIGH (ref 65–99)

## 2017-04-25 MED ORDER — SODIUM CHLORIDE 0.9 % IV SOLN
250.0000 mL | INTRAVENOUS | Status: DC | PRN
Start: 2017-04-25 — End: 2017-04-28
  Administered 2017-04-27: 250 mL via INTRAVENOUS

## 2017-04-25 MED ORDER — INSULIN ASPART 100 UNIT/ML ~~LOC~~ SOLN: 1.0000 [IU] | SUBCUTANEOUS | Status: DC

## 2017-04-25 MED ORDER — ASPIRIN 81 MG PO CHEW: 324.0000 mg | CHEWABLE_TABLET | Freq: Once | ORAL | Status: DC

## 2017-04-25 MED ORDER — FUROSEMIDE 10 MG/ML IJ SOLN
40.0000 mg | Freq: Once | INTRAMUSCULAR | Status: AC
  Administered 2017-04-25: 40 mg via INTRAVENOUS
  Filled 2017-04-25: qty 4

## 2017-04-25 MED ORDER — HYDRALAZINE HCL 20 MG/ML IJ SOLN: 10.0000 mg | INTRAMUSCULAR | Status: DC | PRN

## 2017-04-25 MED ORDER — DEXTROSE 5 % IV SOLN
500.0000 mg | Freq: Once | INTRAVENOUS | Status: AC
  Administered 2017-04-25: 500 mg via INTRAVENOUS
  Filled 2017-04-25 (×2): qty 500

## 2017-04-25 MED ORDER — ENOXAPARIN SODIUM 30 MG/0.3ML ~~LOC~~ SOLN
30.0000 mg | SUBCUTANEOUS | Status: DC
  Administered 2017-04-26 – 2017-04-28 (×3): 30 mg via SUBCUTANEOUS
  Filled 2017-04-25 (×3): qty 0.3

## 2017-04-25 MED ORDER — NITROGLYCERIN 2 % TD OINT
1.0000 [in_us] | TOPICAL_OINTMENT | Freq: Four times a day (QID) | TRANSDERMAL | Status: DC
  Administered 2017-04-25 – 2017-04-26 (×6): 1 [in_us] via TOPICAL
  Filled 2017-04-25: qty 30
  Filled 2017-04-25: qty 1

## 2017-04-25 MED ORDER — ASPIRIN 81 MG PO CHEW: 324.0000 mg | CHEWABLE_TABLET | ORAL | Status: AC

## 2017-04-25 MED ORDER — FUROSEMIDE 10 MG/ML IJ SOLN
40.0000 mg | Freq: Two times a day (BID) | INTRAMUSCULAR | Status: AC
Start: 2017-04-25 — End: 2017-04-30
  Administered 2017-04-25 – 2017-04-30 (×11): 40 mg via INTRAVENOUS
  Filled 2017-04-25 (×11): qty 4

## 2017-04-25 MED ORDER — ASPIRIN 300 MG RE SUPP
300.0000 mg | RECTAL | Status: AC
  Administered 2017-04-25: 300 mg via RECTAL
  Filled 2017-04-25: qty 1

## 2017-04-25 MED ORDER — SODIUM CHLORIDE 0.9 % IV BOLUS (SEPSIS)
1000.0000 mL | Freq: Once | INTRAVENOUS | Status: AC
  Administered 2017-04-25: 1000 mL via INTRAVENOUS

## 2017-04-25 MED ORDER — DEXTROSE 5 % IV SOLN
1.0000 g | Freq: Once | INTRAVENOUS | Status: AC
  Administered 2017-04-25: 1 g via INTRAVENOUS
  Filled 2017-04-25: qty 10

## 2017-04-25 MED ORDER — SODIUM CHLORIDE 0.9 % IV SOLN
INTRAVENOUS | Status: DC
  Administered 2017-04-26: 2.5 [IU]/h via INTRAVENOUS
  Filled 2017-04-25: qty 1

## 2017-04-25 NOTE — ED Notes (Signed)
Upon assessment, pt oriented to self but is thrashing in bed and visibly agitated by pulse ox probe and nasal cannula. 2L Matthews placed on pt due to O2 sat fluctuating between 90% and 96% on RA.

## 2017-04-25 NOTE — ED Provider Notes (Signed)
[facility] DEPT Provider Note   CSN: [id number] Arrival date & time: 04/25/17  1237     History   Chief Complaint Chief Complaint  Patient presents with  . Altered Mental Status    HPI Charlene Rubio is a 71 y.o. female.  HPI Family members report the patient has avoided medical care for many years. They report at baseline she has been healthy and active. However, over past years and months there have been signs of suspected dementia. Patient at times becomes confused or agitated. Patient has been very paranoid about dementia because her parents had Alzheimer's. They report this seems to distress her more. There was a dramatic change over the past couple of weeks. Patient has been having episodes of shortness of breath that were sporadic and suspected by family to be secondary to anxiety or panic. Recently however this is become much worse. They deny the patient is ever complained of any pain. She has not had episodes of vomiting or diarrhea. They have noted her to have a small persistent cough. As of today she's become much more confused than her baseline with associated agitation and dyspnea. They have at times suspected congestive heart failure as her parents had that. However, due to her extreme fear of hospitals of medical care, she has not had any formal diagnoses for a number of years. Past Medical History:  Diagnosis Date  . Allergy   . Aortic insufficiency   . Borderline diabetic   . Glaucoma suspect   . Hypertension   . Obesity   . Thyroid disease     Patient Active Problem List   Diagnosis Date Noted  . DOE (dyspnea on exertion) 04/25/2017  . Transient alteration of awareness 04/25/2017  . Bilateral leg edema 04/25/2017  . Acquired leg deformity 04/25/2017  . Thyroid disease 04/25/2017  . Noncompliance 04/25/2017  . Anxiety about health 04/25/2017  . Dehydration 04/25/2017  . CHF (congestive heart failure) ([place]) 04/25/2017  . Acute respiratory failure with  hypoxia and hypercapnia ([place]) 04/25/2017  . Acute diastolic (congestive) heart failure ([place]) 04/25/2017  . Diabetes mellitus ([place]) 04/25/2017  . Hearing loss secondary to cerumen impaction 12/23/2012  . KNEE PAIN, LEFT 04/06/2010  . Obesity, unspecified 07/01/2007  . Aortic valve disorder 07/01/2007  . ALLERGIC RHINITIS 07/01/2007  . Essential hypertension 07/01/2007    No past surgical history on file.  OB History    No data available       Home Medications    Prior to Admission medications   Not on File    Family History Family History  Problem Relation Age of Onset  . Alzheimer's disease Mother 69  . Alzheimer's disease Father 33  . Congestive Heart Failure Father   . Alcoholism Brother   . Alzheimer's disease Brother   . Dementia Brother   . Alcoholism Brother   . Diabetes Brother     Social History Social History  Substance Use Topics  . Smoking status: Never Smoker  . Smokeless tobacco: Never Used  . Alcohol use No     Allergies   Patient has no known allergies.   Review of Systems Review of Systems Level V caveat cannot obtain review systems due to dementia.  Physical Exam Updated Vital Signs BP (!) 113/58   Pulse 62   Temp 98.5 F (36.9 C) (Oral)   Resp 16   Ht 5' (1.524 m)   Wt 76.7 kg (169 lb)   SpO2 99%   BMI 33.01 kg/m  Physical Exam  Constitutional: She appears well-developed and well-nourished.  Patient is alert and agitated. She has tachypnea.  HENT:  Head: Normocephalic and atraumatic.  Nose: Nose normal.  Mouth/Throat: Oropharynx is clear and moist.  Eyes: Pupils are equal, round, and reactive to light. EOM are normal.  Neck: Neck supple.  Cardiovascular:  Tachycardia. No gross rub murmur gallop.  Pulmonary/Chest:  Tachypnea. Decreased breath sounds at bases. No gross wheezes.  Abdominal: Soft. She exhibits no distension. There is no tenderness. There is no guarding.  Musculoskeletal: Normal range of motion. She  exhibits edema. She exhibits no tenderness.  Neurological: She is alert. No cranial nerve deficit. She exhibits normal muscle tone.  The patient is alert but very confused. There does seem to be an element of baseline dementia. Confusion does not seem to be somnolence related. Patient is moving all 4 extremities. She is reaching and trying to remove monitors.  Skin: Skin is warm and dry. No rash noted.  Psychiatric:  Mood is anxious and agitated.     ED Treatments / Results  Labs (all labs ordered are listed, but only abnormal results are displayed) Labs Reviewed  COMPREHENSIVE METABOLIC PANEL - Abnormal; Notable for the following:       Result Value   Glucose, Bld 428 (*)    BUN 25 (*)    Creatinine, Ser 1.22 (*)    Total Protein 6.1 (*)    Total Bilirubin 1.8 (*)    GFR calc non Af Amer 43 (*)    GFR calc Af Amer 50 (*)    All other components within normal limits  CBC - Abnormal; Notable for the following:    RBC 5.12 (*)    Hemoglobin 16.1 (*)    HCT 48.5 (*)    Platelets 105 (*)    All other components within normal limits  URINALYSIS, ROUTINE W REFLEX MICROSCOPIC - Abnormal; Notable for the following:    Color, Urine AMBER (*)    APPearance HAZY (*)    Glucose, UA >=500 (*)    Hgb urine dipstick SMALL (*)    Protein, ur >=300 (*)    Squamous Epithelial / LPF 0-5 (*)    All other components within normal limits  BRAIN NATRIURETIC PEPTIDE - Abnormal; Notable for the following:    B Natriuretic Peptide >4,500.0 (*)    All other components within normal limits  I-STAT CG4 LACTIC ACID, ED - Abnormal; Notable for the following:    Lactic Acid, Venous 3.12 (*)    All other components within normal limits  I-STAT TROPONIN, ED - Abnormal; Notable for the following:    Troponin i, poc 0.31 (*)    All other components within normal limits  I-STAT VENOUS BLOOD GAS, ED - Abnormal; Notable for the following:    Bicarbonate 28.8 (*)    Acid-Base Excess 3.0 (*)    All other  components within normal limits  CBG MONITORING, ED - Abnormal; Notable for the following:    Glucose-Capillary 386 (*)    All other components within normal limits  I-STAT ARTERIAL BLOOD GAS, ED - Abnormal; Notable for the following:    pH, Arterial 7.229 (*)    pCO2 arterial 65.2 (*)    pO2, Arterial 471.0 (*)    All other components within normal limits  CULTURE, BLOOD (ROUTINE X 2)  CULTURE, BLOOD (ROUTINE X 2)  BLOOD GAS, VENOUS  DIFFERENTIAL  LIPASE, BLOOD  CBC  CREATININE, SERUM  CBC  BASIC METABOLIC PANEL  TSH  T4, FREE  T3, FREE    EKG  EKG Interpretation  Date/Time:  Friday April 25 2017 12:51:20 EDT Ventricular Rate:  97 PR Interval:  144 QRS Duration: 102 QT Interval:  350 QTC Calculation: 444 R Axis:   100 Text Interpretation:  Sinus rhythm with Premature supraventricular complexes and Fusion complexes Rightward axis ST & T wave abnormality, consider inferolateral ischemia Abnormal ECG agree. no STEMI Confirmed by Charlesetta Shanks [id number]) on 04/25/2017 6:37:32 PM       Radiology Dg Chest Port 1 View  Result Date: 04/25/2017 CLINICAL DATA:  Altered mental status for several weeks, increased agitation. EXAM: PORTABLE CHEST 1 VIEW COMPARISON:  None. FINDINGS: Patchy bilateral airspace opacities, most prominent at the left lung base. Heart size cannot be characterized as margins are obscured by the overlying airspace opacities. Atherosclerotic changes noted at the aortic arch. No acute or suspicious osseous finding. IMPRESSION: 1. Patchy bilateral airspace opacities, most likely pulmonary edema. 2. Denser opacity at the left lung base could represent pneumonia, atelectasis and/or small pleural effusion. 3.  Aortic atherosclerosis. Electronically Signed   By: Franki Cabot M.D.   On: 04/25/2017 18:03    Procedures Procedures (including critical care time) CRITICAL CARE Performed by: Charlesetta Shanks   Total critical care time: 60 minutes  Critical care time  was exclusive of separately billable procedures and treating other patients.  Critical care was necessary to treat or prevent imminent or life-threatening deterioration.  Critical care was time spent personally by me on the following activities: development of treatment plan with patient and/or surrogate as well as nursing, discussions with consultants, evaluation of patient's response to treatment, examination of patient, obtaining history from patient or surrogate, ordering and performing treatments and interventions, ordering and review of laboratory studies, ordering and review of radiographic studies, pulse oximetry and re-evaluation of patient's condition.  Medications Ordered in ED Medications  aspirin chewable tablet 324 mg (0 mg Oral Hold 04/25/17 2107)  nitroGLYCERIN (NITROGLYN) 2 % ointment 1 inch (1 inch Topical Given 04/25/17 2031)  0.9 %  sodium chloride infusion (not administered)  enoxaparin (LOVENOX) injection 30 mg (not administered)  furosemide (LASIX) injection 40 mg (40 mg Intravenous Given 04/25/17 2207)  hydrALAZINE (APRESOLINE) injection 10 mg (not administered)  insulin aspart (novoLOG) injection 1-3 Units (not administered)  sodium chloride 0.9 % bolus 1,000 mL (0 mLs Intravenous Stopped 04/25/17 1923)  cefTRIAXone (ROCEPHIN) 1 g in dextrose 5 % 50 mL IVPB (0 g Intravenous Stopped 04/25/17 1923)  azithromycin (ZITHROMAX) 500 mg in dextrose 5 % 250 mL IVPB (0 mg Intravenous Stopped 04/25/17 2009)  furosemide (LASIX) injection 40 mg (40 mg Intravenous Given 04/25/17 2031)  aspirin chewable tablet 324 mg ( Oral See Alternative 04/25/17 2207)    Or  aspirin suppository 300 mg (300 mg Rectal Given 04/25/17 2207)     Initial Impression / Assessment and Plan / ED Course  I have reviewed the triage vital signs and the nursing notes.  Pertinent labs & imaging results that were available during my care of the patient were reviewed by me and considered in my medical decision making (see  chart for details).    Consult: Dr. Oletta Darter intensivist will see patient in the emergency department.  Final Clinical Impressions(s) / ED Diagnoses   Final diagnoses:  Acute congestive heart failure, unspecified heart failure type ([place])  Acute respiratory failure with hypoxia ([facility])  Confusion   Patient presents as outlined above. Upon initial presentation high suspicion was for  sepsis with probable pneumonia. Chest x-ray however showed significant vascular congestion as well as mildly elevated troponin and significantly elevated BNP. With this, I suspect patient has hypoxic respiratory failure with congestive heart failure. Unclear if she may have had an MI earlier in the week or several weeks preceding as that is when family members were noticing them more acute in dramatic change. Although patient has tachypnea, with supplemental oxygen she maintains normal oxygen saturation. Although she is confused, her level of alertness is good. Patient has been placed on BiPAP with Lasix and aspirin administered. She has also been empirically treated for community-acquired pneumonia. Plan for admission to ICU. New Prescriptions New Prescriptions   No medications on file     Charlesetta Shanks, MD 04/25/17 2211

## 2017-04-25 NOTE — ED Notes (Signed)
Soft bilateral wrist restraints applied. Pulses 2+ bilaterally.

## 2017-04-25 NOTE — ED Notes (Addendum)
Dr. Johnney Killian notified of I-stat CG4, I-stat trop. and venous blood gas.

## 2017-04-25 NOTE — ED Notes (Signed)
ED Provider at bedside. 

## 2017-04-25 NOTE — ED Notes (Signed)
Bolus stopped by this nurse and Dr. Johnney Killian notified of BNP.

## 2017-04-25 NOTE — Progress Notes (Signed)
Patient ID: Charlene Rubio, female   DOB: [birth year], 71 y.o.   MRN: [id number]    Location:  [facility] Place of Service: OFFICE    Advanced Directive information  FULL CODE per spouse. She does not have a living will/HCPOA  Chief Complaint  Patient presents with  . Establish Care    New Patient establish care, SOB x 5-6 weeks (worse x 3 weeks). Possible UTI- strong odor and cognitive changes (unable to perform clean catch) , no pain or discomfort. Left leg twisted and swollen. Decreased appetite and positive depression screenings . Here with daughter-in law and husband   . Immunizations    Hold off on flu vaccine until acute concerns addressed  . Medication Refill    Not currently taking any medications 2011-2012 (medications on list is what patient was previous taken)    HPI:  71 yo female seen today as a new pt. Pt has not been seen by provider since 2014. Per family, pt is afraid of seeing a doctor as she does not want to be dx with any medical problems. She stopped taking BP meds some time ago. She has a hx AI but no recent 2D echo/cardiac f/u. She was independent of ADLs and driving up until 3 weeks ago when she began to have increased confusion, aphasia (1-2 word sentences; jabbering), sleeping all the time, difficulty walking, poor appetite and reduced po intake. She has a hx anxiety and has had increased agitation/paranoia/hallucinations. Previously, pt was adament about going to the doctor but has become unaware of surroundings in the last few days. Urine has strong odor and is dark in nature. She has DOE and orthopnea. Family states she has to lean forward to breathe. Spouse noticed LLE has become hard and skin changed in last several days. She fell about 4 yrs ago and may have broken left leg but refused to go to the hospital for evaluation. She has simply been walking around on it. (+) urinary/bowel incontinence. (+) FHx Alzheimer's in both parents. No HM done in last several yrs.   Pt is  a poor historian due to acuity of situation. Hx obtained from spouse and daughter-in-law. Discussed advanced directives. Pt has no living will or designated HCPOA. Spouse prefers FULL CODE at this time.  Past Medical History:  Diagnosis Date  . Allergy   . Aortic insufficiency   . Borderline diabetic   . Glaucoma suspect   . Hypertension   . Obesity   . Thyroid disease     No past surgical history on file.  Patient Care Team: [name], DO as PCP - General (Internal Medicine)  Social History   Social History  . Marital status: Married    Spouse name: N/A  . Number of children: N/A  . Years of education: N/A   Occupational History  . Not on file.   Social History Main Topics  . Smoking status: Never Smoker  . Smokeless tobacco: Never Used  . Alcohol use No  . Drug use: No  . Sexual activity: Not Currently   Other Topics Concern  . Not on file   Social History Narrative   Social History      Diet?       Do you drink/eat things with caffeine? Yes (minimal) coke      Marital status?          married  What year were you married? 1968      Do you live in a house, apartment, assisted living, condo, trailer, etc.? house      Is it one or more stories? one      How many persons live in your home? 2      Do you have any pets in your home? (please list) none      Highest level of education completed?      Current or past profession: preschool teacher/nanny      Do you exercise?         0                             Type & how often?      Advanced Directives      Do you have a living will?      Do you have a DNR form?                                  If not, do you want to discuss one?      Do you have signed POA/HPOA for forms?       Functional Status      Do you have difficulty bathing or dressing yourself?      Do you have difficulty preparing food or eating?       Do you have difficulty managing your medications?       Do you have difficulty managing your finances?      Do you have difficulty affording your medications?        reports that she has never smoked. She has never used smokeless tobacco. She reports that she does not drink alcohol or use drugs.  Family History  Problem Relation Age of Onset  . Alzheimer's disease Mother 62  . Alzheimer's disease Father 24  . Congestive Heart Failure Father   . Alcoholism Brother   . Alzheimer's disease Brother   . Dementia Brother   . Alcoholism Brother   . Diabetes Brother    Family Status  Relation Status  . Mother Deceased  . Father Deceased  . Brother Alive  . Brother Alive  . Brother Deceased  . Son Alive  . Son Alive  . Son Alive    Immunization History  Administered Date(s) Administered  . Influenza Split 08/19/2011  . Influenza Whole 07/01/2007  . Pneumococcal Polysaccharide-23 02/17/2009, 12/23/2012  . Td 08/20/1999, 04/06/2010    No Known Allergies  Medications: Patient's Medications  New Prescriptions   No medications on file  Previous Medications   No medications on file  Modified Medications   No medications on file  Discontinued Medications   ASPIRIN 81 MG TABLET    Take 160 mg by mouth daily.     CETIRIZINE (ZYRTEC) 10 MG TABLET    Take 10 mg by mouth daily.     CONJUGATED ESTROGENS (PREMARIN) VAGINAL CREAM    Place 0.5 Applicatorfuls vaginally daily.   HYDROCHLOROTHIAZIDE (HYDRODIURIL) 25 MG TABLET    TAKE 1 TABLET BY MOUTH EVERY DAY   LORATADINE (CLARITIN) 10 MG TABLET    Take 10 mg by mouth daily.     QUINAPRIL (ACCUPRIL) 40 MG TABLET    TAKE 1 TABLET BY MOUTH EVERY DAY    Review of Systems  Unable to perform ROS: Acuity of condition  Constitutional:  Positive for appetite change (loss) and unexpected weight change (wt loss).  HENT: Positive for hearing loss and trouble swallowing.        Dry mouth  Eyes: Positive for visual disturbance.       Glaucoma  Respiratory: Positive for cough and shortness of  breath.   Cardiovascular: Positive for leg swelling.  Genitourinary:       Strong urine odor  Musculoskeletal: Positive for arthralgias and joint swelling (with stiffness).  Skin: Positive for color change (especially LLE).  Neurological: Positive for weakness and numbness.  Psychiatric/Behavioral: Positive for agitation, confusion, dysphoric mood and hallucinations. The patient is nervous/anxious.        Memory loss  per new pt packet  Vitals:   04/25/17 1107  BP: (!) 168/80  Pulse: (!) 116  Resp: 20  Temp: 98.2 F (36.8 C)  SpO2: 94%  Weight: 169 lb (76.7 kg)  Height: 5' (1.524 m)   Body mass index is 33.01 kg/m.  Physical Exam  Constitutional: She appears well-developed and well-nourished.  HENT:  MM dry; no oral thrush  Eyes: Pupils are equal, round, and reactive to light. Right eye exhibits no discharge. Left eye exhibits no discharge. No scleral icterus.  Neck: Neck supple. No JVD present. Carotid bruit is not present. Thyromegaly present.  Cardiovascular: Regular rhythm, intact distal pulses and normal pulses.  Tachycardia present.  Exam reveals distant heart sounds.   Murmur heard.  Systolic murmur is present with a grade of 1/6  +2 pitting L>RLE edema; left leg warm to touch with chronic venous stasis changes b/l; no calf TTP b/l but palpable left cord.  Pulmonary/Chest: Accessory muscle usage present. Tachypnea noted. She is in respiratory distress. She has decreased breath sounds (at base b/l). She has no wheezes. She has no rhonchi. She has rales (inspiratory).  Abdominal: Soft. Bowel sounds are normal. She exhibits distension. She exhibits no fluid wave, no abdominal bruit, no ascites and no mass. There is no hepatomegaly. There is tenderness in the right upper quadrant. There is no rigidity, no rebound, no guarding and no CVA tenderness. No hernia.  Musculoskeletal: She exhibits edema and deformity (medial left leg). She exhibits no tenderness.  Lymphadenopathy:     She has no cervical adenopathy.  Neurological: She is alert. No cranial nerve deficit.  Skin: Skin is warm and dry. Rash (b/l LE) noted.  Psychiatric: Her mood appears anxious. Her speech is rapid and/or pressured. She is agitated. Thought content is paranoid. Cognition and memory are impaired.     Labs reviewed: Office Visit on 04/25/2017  Component Date Value Ref Range Status  . Color, UA 04/25/2017 Dark Yellow   Final  . Clarity, UA 04/25/2017 Cloudy   Final  . Glucose, UA 04/25/2017 250   Final   Could be related to dark color of urine   . Bilirubin, UA 04/25/2017 Neg   Final  . Ketones, UA 04/25/2017 Neg   Final  . Spec Grav, UA 04/25/2017 >=1.030* 1.010 - 1.025 Final  . Blood, UA 04/25/2017 Moderate   Final  . pH, UA 04/25/2017 6.0  5.0 - 8.0 Final  . Protein, UA 04/25/2017 300   Final  . Urobilinogen, UA 04/25/2017 2.0* 0.2 or 1.0 E.U./dL Final  . Nitrite, UA 04/25/2017 Neg   Final  . Leukocytes, UA 04/25/2017 Moderate (2+)* Negative Final   Unable to obtain a clean catch     No results found.   Assessment/Plan   ICD-10-CM   1. DOE (dyspnea  on exertion) - ? Etiology but need to r/o pneumonia, effusion, CHF R06.09   2. Transient alteration of awareness - r/o stroke/mass R40.4 POC Urinalysis Dipstick  3. Bilateral leg edema R60.0   4. Acquired leg deformity M21.959    left; traumatic several yrs ago 2/2 fall  5. Aortic valve disorder I35.9    AI hx  6. Essential hypertension - uncontrolled I10   7. Thyroid disease E07.9   8. Noncompliance - of medication/medical advice Z91.19   9. Anxiety about health F41.8   10. Dehydration - as s/o high specific gravity on UA E86.0   11.    Advanced directives counseling/discussion   Z71.89  S/w charge nurse, Janett Billow, at [facility]. Pt sent to ED by private vehicle per family request for further eval and mx.  Pt will f/u after d/c  Marvie Calender S. Perlie Gold  [facility] [address] [place], [place] [address] [phone] Cell (Monday-Friday 8 AM - 5 PM) [phone] After 5 PM and follow prompts

## 2017-04-25 NOTE — ED Notes (Signed)
Intensivist at bedside.

## 2017-04-25 NOTE — ED Triage Notes (Signed)
Pt here from PCP for altered mental status for several weeks. Pt has had rapid decline in mental status over last 2 weeks with increased agitation. Pt has been having sx of dementia and alzheimers for several years now. Pt has been incontinent recently which is new. VSS. Pt oriented to self and place, disoriented to time.

## 2017-04-25 NOTE — ED Notes (Signed)
Sent label to main lab to add on diff and lipase.

## 2017-04-25 NOTE — ED Notes (Addendum)
Dr. Johnney Killian notified of pt condition. EDP by the bedside. Will have Katie, RN assist this RN with placing an IV due to pt agitation.

## 2017-04-25 NOTE — ED Notes (Signed)
Dr. Colvin Caroli updated to pt condition and notified of lactic and troponin value. Awaiting BNP results.

## 2017-04-25 NOTE — ED Notes (Signed)
Pt alert and able to follow certain commands. Pt alert to self and situation, able to communicate and answer appropriately. Pt with family at the bedside. Per family, pt is terrified of the doctor. Pt with nasal cannula blowing 3L O2 in her mouth. Pt breathing primarily through her mouth and becomes agitated if [place] is in her nose.

## 2017-04-25 NOTE — ED Notes (Signed)
57mls noted on bladder scan

## 2017-04-25 NOTE — H&P (Signed)
PULMONARY / CRITICAL CARE MEDICINE   Name: Charlene Rubio MRN: [id number] DOB: 03-18-1946    ADMISSION DATE:  04/25/2017 CONSULTATION DATE:  04/25/2017 REFERRING MD:  ED CHIEF COMPLAINT:  Shortness of breath  HISTORY OF PRESENT ILLNESS:   71 yr old lady with suspected dementia, HTN not treated who is coming in with shortness of breath. As per husband she has been short of breath for 3 weeks progressing with increase lower limb swelling. Over the last few days she has been bed bound and not able to walk from shortness of breath. He states that she has severe anxiety from doctors and she is worried that she will be diagnosed with dementia and his husband has noticed that she has been very forgetful. She had no history of fever, chills, cough or sputum. She was diagnosed with HTN a while ago but refused to use her medicines.  In the ED she was found to have bilateral infiltrates/pulm edema and severe hyperglycemia. Patient is being put on BIPAP. She is lethargic but is easily arousable. Not participating her history .    PAST MEDICAL HISTORY :  She  has a past medical history of Allergy; Aortic insufficiency; Borderline diabetic; Glaucoma suspect; Hypertension; Obesity; and Thyroid disease.  PAST SURGICAL HISTORY: She  has no past surgical history on file.  No Known Allergies  No current facility-administered medications on file prior to encounter.    No current outpatient prescriptions on file prior to encounter.    FAMILY HISTORY:  Her indicated that her mother is deceased. She indicated that her father is deceased. She indicated that two of her three brothers are alive. She indicated that all of her three sons are alive.    SOCIAL HISTORY: She  reports that she has never smoked. She has never used smokeless tobacco. She reports that she does not drink alcohol or use drugs.  REVIEW OF SYSTEMS:   Could not be obtained due to AMS   SUBJECTIVE:  See above   VITAL SIGNS: BP 108/62    Pulse 85   Temp 98.5 F (36.9 C) (Oral)   Resp 16   Ht 5' (1.524 m)   Wt 76.7 kg (169 lb)   SpO2 100%   BMI 33.01 kg/m   HEMODYNAMICS:    VENTILATOR SETTINGS:    INTAKE / OUTPUT: No intake/output data recorded.  PHYSICAL EXAMINATION: General:  Obese not  In distress  Neuro: lethargic, wakes up and moves all extremities  HEENT:  Moist  Cardiovascular:  Normal heart sounds no murmurs  Lungs:  Bilateral coarse crackles  Abdomen:  Soft no tenderness  Musculoskeletal:  Bilateral edema  Skin:  No rash  LABS:  BMET  Recent Labs Lab 04/25/17 1310  NA 143  K 3.7  CL 105  CO2 23  BUN 25*  CREATININE 1.22*  GLUCOSE 428*    Electrolytes  Recent Labs Lab 04/25/17 1310  CALCIUM 9.0    CBC  Recent Labs Lab 04/25/17 1310  WBC 9.8  HGB 16.1*  HCT 48.5*  PLT 105*    Coag's No results for input(s): APTT, INR in the last 168 hours.  Sepsis Markers  Recent Labs Lab 04/25/17 1825  LATICACIDVEN 3.12*    ABG  Recent Labs Lab 04/25/17 2026  PHART 7.229*  PCO2ART 65.2*  PO2ART 471.0*    Liver Enzymes  Recent Labs Lab 04/25/17 1310  AST 33  ALT 42  ALKPHOS 63  BILITOT 1.8*  ALBUMIN 3.9    Cardiac  Enzymes No results for input(s): TROPONINI, PROBNP in the last 168 hours.  Glucose  Recent Labs Lab 04/25/17 1911  GLUCAP 386*    Imaging Dg Chest Port 1 View  Result Date: 04/25/2017 CLINICAL DATA:  Altered mental status for several weeks, increased agitation. EXAM: PORTABLE CHEST 1 VIEW COMPARISON:  None. FINDINGS: Patchy bilateral airspace opacities, most prominent at the left lung base. Heart size cannot be characterized as margins are obscured by the overlying airspace opacities. Atherosclerotic changes noted at the aortic arch. No acute or suspicious osseous finding. IMPRESSION: 1. Patchy bilateral airspace opacities, most likely pulmonary edema. 2. Denser opacity at the left lung base could represent pneumonia, atelectasis and/or small  pleural effusion. 3.  Aortic atherosclerosis. Electronically Signed   By: Franki Cabot M.D.   On: 04/25/2017 18:03     STUDIES:  CXr bilateral pulm edema    ASSESSMENT / PLAN:  Acute hypoxemic hypercapnic respiratory failure requiring non invasive mechanical ventilation  Newly diagnosed DM with severe hyperglycemia  Acute kidney injury Dementia Obesity    Plan: - IV lasix q12hr - BIPAP - NPO - foley cath and in/out - DVT prophylaxis  - DNR as per discussion with the family  - start insulin sliding scale - hydralazine PRN - repeat ABg - Echo in the morning  - ASA - thyroid function      FAMILY  - Updates: discussed with husband and son bedside and agree that sh would not want CPR or life support. Patient is DNR   - Inter-disciplinary family meet or Palliative Care meeting due by: 05/02/2017    Pulmonary and [place] Pager: [phone]  04/25/2017, 8:43 PM

## 2017-04-25 NOTE — Patient Instructions (Signed)
GO TO THE ER NOW  Follow up after discharge

## 2017-04-25 NOTE — Progress Notes (Signed)
cbg 310.  ICU hyperglycemia phase 2 initiated per order for BS >250.

## 2017-04-26 ENCOUNTER — Inpatient Hospital Stay (HOSPITAL_COMMUNITY): Payer: Medicare Other

## 2017-04-26 DIAGNOSIS — I351 Nonrheumatic aortic (valve) insufficiency: Secondary | ICD-10-CM

## 2017-04-26 DIAGNOSIS — I340 Nonrheumatic mitral (valve) insufficiency: Secondary | ICD-10-CM

## 2017-04-26 DIAGNOSIS — J9602 Acute respiratory failure with hypercapnia: Secondary | ICD-10-CM

## 2017-04-26 DIAGNOSIS — I5031 Acute diastolic (congestive) heart failure: Secondary | ICD-10-CM

## 2017-04-26 DIAGNOSIS — J9601 Acute respiratory failure with hypoxia: Secondary | ICD-10-CM

## 2017-04-26 LAB — GLUCOSE, CAPILLARY
GLUCOSE-CAPILLARY: 321 mg/dL — AB (ref 65–99)
GLUCOSE-CAPILLARY: 80 mg/dL (ref 65–99)
GLUCOSE-CAPILLARY: 109 mg/dL — AB (ref 65–99)
GLUCOSE-CAPILLARY: 88 mg/dL (ref 65–99)
Glucose-Capillary: 282 mg/dL — ABNORMAL HIGH (ref 65–99)
Glucose-Capillary: 158 mg/dL — ABNORMAL HIGH (ref 65–99)
Glucose-Capillary: 115 mg/dL — ABNORMAL HIGH (ref 65–99)
Glucose-Capillary: 84 mg/dL (ref 65–99)
Glucose-Capillary: 73 mg/dL (ref 65–99)
Glucose-Capillary: 88 mg/dL (ref 65–99)
Glucose-Capillary: 100 mg/dL — ABNORMAL HIGH (ref 65–99)

## 2017-04-26 LAB — BLOOD CULTURE ID PANEL (REFLEXED)
ACINETOBACTER BAUMANNII: NOT DETECTED
CANDIDA ALBICANS: NOT DETECTED
CANDIDA KRUSEI: NOT DETECTED
CANDIDA PARAPSILOSIS: NOT DETECTED
Candida glabrata: NOT DETECTED
Candida tropicalis: NOT DETECTED
Carbapenem resistance: NOT DETECTED
ENTEROBACTER CLOACAE COMPLEX: NOT DETECTED
ENTEROBACTERIACEAE SPECIES: NOT DETECTED
ESCHERICHIA COLI: NOT DETECTED
Enterococcus species: NOT DETECTED
Haemophilus influenzae: NOT DETECTED
KLEBSIELLA OXYTOCA: NOT DETECTED
KLEBSIELLA PNEUMONIAE: NOT DETECTED
Listeria monocytogenes: NOT DETECTED
Methicillin resistance: NOT DETECTED
NEISSERIA MENINGITIDIS: NOT DETECTED
PSEUDOMONAS AERUGINOSA: NOT DETECTED
Proteus species: NOT DETECTED
STAPHYLOCOCCUS AUREUS BCID: NOT DETECTED
STAPHYLOCOCCUS SPECIES: DETECTED — AB
STREPTOCOCCUS AGALACTIAE: NOT DETECTED
STREPTOCOCCUS PNEUMONIAE: NOT DETECTED
STREPTOCOCCUS SPECIES: NOT DETECTED
Serratia marcescens: NOT DETECTED
Streptococcus pyogenes: NOT DETECTED
Vancomycin resistance: NOT DETECTED

## 2017-04-26 LAB — CBC
HCT: 47 % — ABNORMAL HIGH (ref 36.0–46.0)
HEMATOCRIT: 48.8 % — AB (ref 36.0–46.0)
Hemoglobin: 16 g/dL — ABNORMAL HIGH (ref 12.0–15.0)
Hemoglobin: 15 g/dL (ref 12.0–15.0)
MCH: 31.2 pg (ref 26.0–34.0)
MCH: 30.7 pg (ref 26.0–34.0)
MCHC: 32.8 g/dL (ref 30.0–36.0)
MCHC: 31.9 g/dL (ref 30.0–36.0)
MCV: 95.1 fL (ref 78.0–100.0)
MCV: 96.1 fL (ref 78.0–100.0)
PLATELETS: 81 10*3/uL — AB (ref 150–400)
Platelets: 86 10*3/uL — ABNORMAL LOW (ref 150–400)
RBC: 5.13 MIL/uL — ABNORMAL HIGH (ref 3.87–5.11)
RBC: 4.89 MIL/uL (ref 3.87–5.11)
RDW: 15.4 % (ref 11.5–15.5)
RDW: 15.4 % (ref 11.5–15.5)
WBC: 9.9 10*3/uL (ref 4.0–10.5)
WBC: 8.6 10*3/uL (ref 4.0–10.5)

## 2017-04-26 LAB — BASIC METABOLIC PANEL
ANION GAP: 11 (ref 5–15)
ANION GAP: 10 (ref 5–15)
BUN: 25 mg/dL — ABNORMAL HIGH (ref 6–20)
BUN: 23 mg/dL — AB (ref 6–20)
CALCIUM: 8.5 mg/dL — AB (ref 8.9–10.3)
CO2: 28 mmol/L (ref 22–32)
CO2: 32 mmol/L (ref 22–32)
CREATININE: 0.99 mg/dL (ref 0.44–1.00)
Calcium: 8.4 mg/dL — ABNORMAL LOW (ref 8.9–10.3)
Chloride: 105 mmol/L (ref 101–111)
Chloride: 103 mmol/L (ref 101–111)
Creatinine, Ser: 1.11 mg/dL — ABNORMAL HIGH (ref 0.44–1.00)
GFR calc Af Amer: 56 mL/min — ABNORMAL LOW (ref >60)
GFR calc Af Amer: >60 mL/min (ref >60)
GFR calc non Af Amer: 48 mL/min — ABNORMAL LOW (ref >60)
GFR, EST NON AFRICAN AMERICAN: 56 mL/min — AB (ref >60)
GLUCOSE: 145 mg/dL — AB (ref 65–99)
GLUCOSE: 121 mg/dL — AB (ref 65–99)
Potassium: 3.2 mmol/L — ABNORMAL LOW (ref 3.5–5.1)
Potassium: 3.5 mmol/L (ref 3.5–5.1)
SODIUM: 144 mmol/L (ref 135–145)
Sodium: 145 mmol/L (ref 135–145)

## 2017-04-26 LAB — ECHOCARDIOGRAM COMPLETE
Height: 60 in
Weight: 2927.71 oz

## 2017-04-26 LAB — HEMOGLOBIN A1C
Hgb A1c MFr Bld: 7.8 % — ABNORMAL HIGH (ref 4.8–5.6)
Mean Plasma Glucose: 177.16 mg/dL

## 2017-04-26 LAB — TROPONIN I: TROPONIN I: 0.37 ng/mL — AB (ref <0.03)

## 2017-04-26 LAB — TSH: TSH: 2.447 u[IU]/mL (ref 0.350–4.500)

## 2017-04-26 LAB — MRSA PCR SCREENING: MRSA BY PCR: NEGATIVE

## 2017-04-26 LAB — T4, FREE: Free T4: 1.09 ng/dL (ref 0.61–1.12)

## 2017-04-26 MED ORDER — INSULIN GLARGINE 100 UNIT/ML ~~LOC~~ SOLN
10.0000 [IU] | SUBCUTANEOUS | Status: DC
  Administered 2017-04-26: 10 [IU] via SUBCUTANEOUS
  Filled 2017-04-26 (×3): qty 0.1

## 2017-04-26 MED ORDER — CHLORHEXIDINE GLUCONATE 0.12 % MT SOLN
15.0000 mL | Freq: Two times a day (BID) | OROMUCOSAL | Status: DC
  Administered 2017-04-26 – 2017-04-30 (×10): 15 mL via OROMUCOSAL
  Filled 2017-04-26 (×11): qty 15

## 2017-04-26 MED ORDER — POTASSIUM CHLORIDE 20 MEQ/15ML (10%) PO SOLN: 40.0000 meq | Freq: Once | ORAL | Status: DC

## 2017-04-26 MED ORDER — POTASSIUM CHLORIDE 10 MEQ/100ML IV SOLN
10.0000 meq | INTRAVENOUS | Status: AC
  Administered 2017-04-26 (×4): 10 meq via INTRAVENOUS
  Filled 2017-04-26 (×4): qty 100

## 2017-04-26 MED ORDER — FAMOTIDINE IN NACL 20-0.9 MG/50ML-% IV SOLN
20.0000 mg | INTRAVENOUS | Status: DC
  Administered 2017-04-26 – 2017-04-28 (×3): 20 mg via INTRAVENOUS
  Filled 2017-04-26 (×3): qty 50

## 2017-04-26 MED ORDER — LIVING WELL WITH DIABETES BOOK
Freq: Once | Status: AC
  Administered 2017-04-26: 19:00:00
  Filled 2017-04-26: qty 1

## 2017-04-26 MED ORDER — ORAL CARE MOUTH RINSE
15.0000 mL | Freq: Two times a day (BID) | OROMUCOSAL | Status: DC
  Administered 2017-04-26 – 2017-04-30 (×6): 15 mL via OROMUCOSAL

## 2017-04-26 MED ORDER — INSULIN ASPART 100 UNIT/ML ~~LOC~~ SOLN
2.0000 [IU] | SUBCUTANEOUS | Status: DC
  Administered 2017-04-27: 6 [IU] via SUBCUTANEOUS
  Administered 2017-04-27 (×2): 4 [IU] via SUBCUTANEOUS

## 2017-04-26 NOTE — Progress Notes (Signed)
PHARMACY - PHYSICIAN COMMUNICATION CRITICAL VALUE ALERT - BLOOD CULTURE IDENTIFICATION (BCID)  Results for orders placed or performed during the hospital encounter of 04/25/17  Blood Culture ID Panel (Reflexed) (Collected: 04/25/2017  5:50 PM)  Result Value Ref Range   Enterococcus species NOT DETECTED NOT DETECTED   Vancomycin resistance NOT DETECTED NOT DETECTED   Listeria monocytogenes NOT DETECTED NOT DETECTED   Staphylococcus species DETECTED (A) NOT DETECTED   Staphylococcus aureus NOT DETECTED NOT DETECTED   Methicillin resistance NOT DETECTED NOT DETECTED   Streptococcus species NOT DETECTED NOT DETECTED   Streptococcus agalactiae NOT DETECTED NOT DETECTED   Streptococcus pneumoniae NOT DETECTED NOT DETECTED   Streptococcus pyogenes NOT DETECTED NOT DETECTED   Acinetobacter baumannii NOT DETECTED NOT DETECTED   Enterobacteriaceae species NOT DETECTED NOT DETECTED   Enterobacter cloacae complex NOT DETECTED NOT DETECTED   Escherichia coli NOT DETECTED NOT DETECTED   Klebsiella oxytoca NOT DETECTED NOT DETECTED   Klebsiella pneumoniae NOT DETECTED NOT DETECTED   Proteus species NOT DETECTED NOT DETECTED   Serratia marcescens NOT DETECTED NOT DETECTED   Carbapenem resistance NOT DETECTED NOT DETECTED   Haemophilus influenzae NOT DETECTED NOT DETECTED   Neisseria meningitidis NOT DETECTED NOT DETECTED   Pseudomonas aeruginosa NOT DETECTED NOT DETECTED   Candida albicans NOT DETECTED NOT DETECTED   Candida glabrata NOT DETECTED NOT DETECTED   Candida krusei NOT DETECTED NOT DETECTED   Candida parapsilosis NOT DETECTED NOT DETECTED   Candida tropicalis NOT DETECTED NOT DETECTED    Name of physician (or Provider) Contacted: Sood  Changes to prescribed antibiotics required: Possible contamination, if no clinical signs of infection would hold off on antibiotics until cultures are finalized  Disa Riedlinger, Rande Lawman 04/26/2017  2:02 PM

## 2017-04-26 NOTE — Progress Notes (Signed)
Inpatient Diabetes Program Recommendations  AACE/ADA: New Consensus Statement on Inpatient Glycemic Control (2015)  Target Ranges:  Prepandial:   less than 140 mg/dL      Peak postprandial:   less than 180 mg/dL (1-2 hours)      Critically ill patients:  140 - 180 mg/dL  Results for Charlene Rubio, Charlene Rubio (MRN [id number]) as of 04/26/2017 09:01  Ref. Range 04/25/2017 13:10  Glucose Latest Ref Range: 65 - 99 mg/dL 428 (H)    Results for Charlene Rubio, Charlene Rubio (MRN [id number]) as of 04/26/2017 09:01  Ref. Range 04/25/2017 19:11 04/25/2017 23:46 04/26/2017 01:12 04/26/2017 02:11 04/26/2017 04:05 04/26/2017 05:13 04/26/2017 06:18 04/26/2017 07:19 04/26/2017 08:22  Glucose-Capillary Latest Ref Range: 65 - 99 mg/dL 386 (H) 310 (H) 321 (H) 282 (H) 158 (H) 115 (H) 84 73 80  Results for Charlene Rubio, Charlene Rubio (MRN [id number]) as of 04/26/2017 09:01  Ref. Range 10/30/2011 08:30  Hemoglobin A1C Latest Ref Range: 4.6 - 6.5 % 6.4   Review of Glycemic Control  Diabetes history: No Outpatient Diabetes medications: NA Current orders for Inpatient glycemic control: IV insulin drip per ICU Glycemic Control Phase 2  Inpatient Diabetes Program Recommendations: Insulin - IV drip/GlucoStabilizer: Received page from Earlie Server, [place] regarding transitioning off IV insulin to SQ insulin. Discussed Phase 3 if ICU Glycemic Control order set and RN placed new orders per ICU Glycemic Control protocol. HgbA1C: Please add on an A1C to blood in lab (if available) to evaluate glycemic control over the past 2-3 months.  NOTE: Received page from RN caring for patient regarding transitioning from IV to SQ insulin. Discussed ICU Glycemic Control order set (Phase 2 and 3). Patient will be transition to Phase 3 (SQ insulin). Noted initial glucose 428 mg/dl on 04/25/17. No A1C has been ordered at this time. Request A1C be ordered. Last A1C in the chart was 6.4% on 3/132013.  Per H&P, patient is being newly dx with DM and will need to be educated by bedside RNs. Ordered: Living Well with  Diabetes booklet, DM education videos, RD consult, patient education by bedside RNs. Diabetes Coordinator is not on campus over the weekend but available by pager daily from 8am-5pm for questions or concerns.   Thanks, Barnie Alderman, RN, MSN, CDE Diabetes Coordinator Inpatient Diabetes Program [phone] (Team Pager from 8am to 5pm)

## 2017-04-26 NOTE — Progress Notes (Signed)
Notified Brian at [facility] of pt's potassium of 3.2.  Will continue to monitor.

## 2017-04-26 NOTE — ED Notes (Signed)
Hand mitts placed on pt R hand due to pt attempting to pull off O2 and monitor cables. Pt other hand left out of hand mitt, daughter requesting to hold pt's hand.

## 2017-04-26 NOTE — ED Notes (Signed)
RT called regarding ABG and BiPap

## 2017-04-26 NOTE — Progress Notes (Signed)
[place] Progress Note Patient Name: Charlene Rubio DOB: 1946/04/14 MRN: [id number]   Date of Service  04/26/2017  HPI/Events of Note  Low K  eICU Interventions  replaced     Intervention Category Minor Interventions: Electrolytes abnormality - evaluation and management  Mauri Brooklyn, P 04/26/2017, 7:03 AM

## 2017-04-26 NOTE — ED Notes (Signed)
Pt not alert enough to safely chew ordered 324 ASA. Will hold at this time.

## 2017-04-26 NOTE — ED Notes (Addendum)
Soft restraints removed by this RN. Pt no longer compromising care by attempting to remove IV/cables. Soft restraints left on the back of the stretcher in the event Anderson Malta, RN on 2H needs to use medical restraints for pt safety. No discoloration or redness noted to pt wrists. Pulses 2+ BUE.

## 2017-04-26 NOTE — Progress Notes (Signed)
  Echocardiogram 2D Echocardiogram has been performed.  Taylar Hartsough 04/26/2017, 8:46 AM

## 2017-04-26 NOTE — Progress Notes (Signed)
Initial Nutrition Assessment  DOCUMENTATION CODES:   Obesity unspecified  INTERVENTION:   -Discussed diabetic diet with husband; "Carbohydrate Counting for People with Diabetes" handout provided to pt's husband. Follow-up and provide further education as needed   -Recommend advancing diet as tolerated now that pt off BiPap  -Recommend addition of nutritional supplement once diet advanced   NUTRITION DIAGNOSIS:   Inadequate oral intake related to acute illness as evidenced by NPO status.  GOAL:   Patient will meet greater than or equal to 90% of their needs  MONITOR:   Diet advancement, PO intake, Labs, Weight trends  REASON FOR ASSESSMENT:   Consult Diet education  ASSESSMENT:   71 yo female admitted with progressive SOB over the past 3 weeks with increased LE swelling with acute respiratory failure, CHF requiring BiPap; AKI, AMS. Pt with hx of suspected dementia, HTN, borderline DM   Husband and son at bedside. Pt pleasantly confused on visit today.  Off BiPap  Husband reports pt's appetite had been poor for 2 weeks with pt barely eating anything. Prior to this, pt eating better but variable and not very healthy foods. Husband reports she would mostly only eat fast food/food out or junk food. For instance, husband would fix a broiled meat with sides and pt would not eat that but would eat some nachos. If he made chicken salad she would not eat it but would go out and get a hamburger  Family is unsure if pt has lost any weight recently  Nutrition-Focused physical exam completed. Findings are no fat depletion, no muscle depletion, and moderate edema.   Labs: HgbA1c 7.8, Creatinine 1.11, potassium 3.2 Meds: lasix, ss novolog, lantus, KCl  Diet Order:  Diet NPO time specified Except for: Sips with Meds  Skin:  Reviewed, no issues  Last BM:  9/7  Height:   Ht Readings from Last 1 Encounters:  04/25/17 5' (1.524 m)    Weight:   Wt Readings from Last 1  Encounters:  04/26/17 182 lb 15.7 oz (83 kg)    Ideal Body Weight:  45.6 kg  BMI:  Body mass index is 35.74 kg/m.  Estimated Nutritional Needs:   Kcal:  6203-5597 kcals  Protein:  83-93 g  Fluid:  >/= 1.5 L  EDUCATION NEEDS:   Education needs addressed  Kerman Passey MS, RD, LDN [phone] Pager  [phone] Weekend/On-Call Pager

## 2017-04-26 NOTE — Progress Notes (Signed)
[facility] / CRITICAL CARE MEDICINE   Name: Charlene Rubio MRN: [id number] DOB: [birth year]    ADMISSION DATE:  04/25/2017 CONSULTATION DATE: 04/25/2017   REFERRING MD:  Charlesetta Shanks  CHIEF COMPLAINT: Shortness of breath  HISTORY OF PRESENT ILLNESS:   71 yr old lady with suspected dementia, HTN not treated who is coming in with shortness of breath. As per husband she has been short of breath for 3 weeks progressing with increase lower limb swelling. Over the last few days she has been bed bound and not able to walk from shortness of breath. He states that she has severe anxiety from doctors and she is worried that she will be diagnosed with dementia and his husband has noticed that she has been very forgetful. She had no history of fever, chills, cough or sputum. She was diagnosed with HTN a while ago but refused to use her medicines.  In the ED she was found to have bilateral infiltrates/pulm edema and severe hyperglycemia. Patient is being put on BIPAP. She is lethargic but is easily arousable. Not participating her history .  PAST MEDICAL HISTORY :  She  has a past medical history of Allergy; Aortic insufficiency; Borderline diabetic; Glaucoma suspect; Hypertension; Obesity; and Thyroid disease.   SUBJECTIVE:    VITAL SIGNS: BP (!) 96/53   Pulse 63   Temp (!) 97.4 F (36.3 C) (Axillary)   Resp 16   Ht 5' (1.524 m)   Wt 182 lb 15.7 oz (83 kg)   SpO2 100%   BMI 35.74 kg/m   HEMODYNAMICS:    VENTILATOR SETTINGS: FiO2 (%):  [60 %] 60 %  INTAKE / OUTPUT: I/O last 3 completed shifts: In: 1111 [I.V.:61; IV Piggyback:1050] Out: 0   PHYSICAL EXAMINATION: General:  Obese elderly female on BiPAP, confused, not following commands, appears older than states age Neuro:  Lethargic, awakens to call of name, not following commands HEENT:  Atraumatic, normocephalic, BiPAP mask, MM pink and moist Cardiovascular: S1, S2, RRR, No MRG Lungs: Bilateral coarse crackles with wheezing noted   Abdomen:   Obese, non-tender, non-distended Musculoskeletal: Bilateral edema, no obvious deformities Skin:Thin, frail, no rash, lesions, bruising  LABS:  BMET  Recent Labs Lab 04/25/17 1310 04/25/17 2306 04/26/17 0455  NA 143  --  144  K 3.7  --  3.2*  CL 105  --  105  CO2 23  --  28  BUN 25*  --  25*  CREATININE 1.22* 1.10* 1.11*  GLUCOSE 428*  --  145*    Electrolytes  Recent Labs Lab 04/25/17 1310 04/26/17 0455  CALCIUM 9.0 8.4*    CBC  Recent Labs Lab 04/25/17 1310 04/25/17 2306 04/26/17 0455  WBC 9.8 9.9 8.6  HGB 16.1* 16.0* 15.0  HCT 48.5* 48.8* 47.0*  PLT 105* 86* 81*    Coag's No results for input(s): APTT, INR in the last 168 hours.  Sepsis Markers  Recent Labs Lab 04/25/17 1825  LATICACIDVEN 3.12*    ABG  Recent Labs Lab 04/25/17 2026  PHART 7.229*  PCO2ART 65.2*  PO2ART 471.0*    Liver Enzymes  Recent Labs Lab 04/25/17 1310  AST 33  ALT 42  ALKPHOS 63  BILITOT 1.8*  ALBUMIN 3.9    Cardiac Enzymes No results for input(s): TROPONINI, PROBNP in the last 168 hours.  Glucose  Recent Labs Lab 04/25/17 2346 04/26/17 0112 04/26/17 0211 04/26/17 0405 04/26/17 0513 04/26/17 0618  GLUCAP 310* 321* 282* 158* 115* 84    Imaging  Dg Chest Port 1 View  Result Date: 04/26/2017 CLINICAL DATA:  Short of breath EXAM: PORTABLE CHEST 1 VIEW COMPARISON:  04/25/2017 FINDINGS: Progression of diffuse bilateral airspace disease compatible with worsening pulmonary edema. Progression of left effusion and left lower lobe atelectasis. IMPRESSION: Worsening heart failure with increase in edema and left effusion. Progression of left lower lobe atelectasis. Electronically Signed   By: Franchot Gallo M.D.   On: 04/26/2017 08:11   Dg Chest Port 1 View  Result Date: 04/25/2017 CLINICAL DATA:  Altered mental status for several weeks, increased agitation. EXAM: PORTABLE CHEST 1 VIEW COMPARISON:  None. FINDINGS: Patchy bilateral airspace  opacities, most prominent at the left lung base. Heart size cannot be characterized as margins are obscured by the overlying airspace opacities. Atherosclerotic changes noted at the aortic arch. No acute or suspicious osseous finding. IMPRESSION: 1. Patchy bilateral airspace opacities, most likely pulmonary edema. 2. Denser opacity at the left lung base could represent pneumonia, atelectasis and/or small pleural effusion. 3.  Aortic atherosclerosis. Electronically Signed   By: Franki Cabot M.D.   On: 04/25/2017 18:03     STUDIES:  9/7 CXR>> Bilateral pulmonary edema  CULTURES: 9/7>> Blood x 2  ANTIBIOTICS: Azithro 9/7>> Rocephin 9/7>>  SIGNIFICANT EVENTS: 9/7 Admission  LINES/TUBES: PIV 9/7>>  DISCUSSION: 71 yr old lady with suspected dementia, HTN not treated who is coming in with shortness of breath for 3 weeks, progressing with increase lower limb swelling. Bed bound due to shortness of breath.She has severe anxiety per husband  and she is worried that she will be diagnosed with dementia as she is very forgetful. She had no history of fever, chills, cough or sputum. She was diagnosed with HTN but is non-compliant with medications.CXR on admission shows pulmonary edema. Additionally she is hyperglycemic. She was placed on BiPAP, treated with Lasix, and placed on an insulin gtt . Per her husband, she would not want CPR or life support. She was made a DNR, with hope to manage medically.CXR on admission  indicates heart failure. BNP on admission was > 4500.  ASSESSMENT / PLAN:  PULMONARY A:Acute hypoxemic hypercapnic respiratory failure requiring non invasive mechanical ventilation  CXR 9/8>> Worsening heart failure with increase in edema and left effusion. Progression of left lower lobe atelectasis. P:  Place on BiPap  Titrate FIO2 / Oxygen for saturations > 94% Titrate IPAP for volumes > 400 cc CXR daily Albuterol prn Continue Lasix 40 IV Q 12 as renal function  allows  CARDIOVASCULAR A: Acute Heart Failure ( Not previously diagnosed) SVT ( Short Runs)  P: Telemetry monitoring  Maintain MAP > 65 BNP Echo 9/8 am 12 Lead EKG prn Troponin Lasix as above as renal function allows CXR 9/9 Apresoline prn SBP> 150 Nitro Paste  RENAL A:   AKI Hypokalemia P:   Trend BMET Mag Phos Replete Electrolytes as needed ( K+ repleted 9/8) Avoid Nephrotoxic drugs Maintain renal perfusion Strict I&O   GASTROINTESTINAL A:   No Acute Issues P:   NPO for now [id number] Q 24    HEMATOLOGIC A:   Thrombocytopenia P:  Trend CBC Transfuse for HGB <7 DVT prophylaxis: PAS Hose Consider discontinuing Lovenox   INFECTIOUS A:   No Acute issues P:   Trend fever/ WBC Curve Consider  PCT/Lactate Cultures as above ABX as above Follow micro for results Narrow ABX therapy once sensitivities available  ENDOCRINE A:   Hyperglycemia Suspect new diagnosis DM   Obesity P:  Insulin gtt per Phase 3  order set Transition when able to SSI CBG Q 1 while on insulin gtt SSI per phase 3  HGB A1C  NEUROLOGIC A:   ? Dementia AMS P:   RASS goal: 0 to 1 Neuro Checks per unit protocol Minimize sedation    FAMILY  - Updates: No family at bedside to provide with an update 9/8  - Inter-disciplinary family meet or Palliative Care meeting due by:  9/14/ 2018   Magdalen Spatz, AGACNP-BC [place] Pager: [phone]  04/26/2017, 8:18 AM

## 2017-04-27 ENCOUNTER — Inpatient Hospital Stay (HOSPITAL_COMMUNITY): Payer: Medicare Other

## 2017-04-27 ENCOUNTER — Encounter (HOSPITAL_COMMUNITY): Payer: Self-pay

## 2017-04-27 LAB — BASIC METABOLIC PANEL
ANION GAP: 10 (ref 5–15)
BUN: 18 mg/dL (ref 6–20)
CALCIUM: 8.2 mg/dL — AB (ref 8.9–10.3)
CHLORIDE: 98 mmol/L — AB (ref 101–111)
CO2: 34 mmol/L — ABNORMAL HIGH (ref 22–32)
Creatinine, Ser: 0.95 mg/dL (ref 0.44–1.00)
GFR calc Af Amer: >60 mL/min (ref >60)
GFR, EST NON AFRICAN AMERICAN: 58 mL/min — AB (ref >60)
Glucose, Bld: 195 mg/dL — ABNORMAL HIGH (ref 65–99)
POTASSIUM: 3.4 mmol/L — AB (ref 3.5–5.1)
SODIUM: 142 mmol/L (ref 135–145)

## 2017-04-27 LAB — COMPREHENSIVE METABOLIC PANEL
ALBUMIN: 2.8 g/dL — AB (ref 3.5–5.0)
ALK PHOS: 46 U/L (ref 38–126)
ALT: 34 U/L (ref 14–54)
ANION GAP: 12 (ref 5–15)
AST: 30 U/L (ref 15–41)
BUN: 20 mg/dL (ref 6–20)
CALCIUM: 7.9 mg/dL — AB (ref 8.9–10.3)
CO2: 32 mmol/L (ref 22–32)
CREATININE: 0.88 mg/dL (ref 0.44–1.00)
Chloride: 101 mmol/L (ref 101–111)
GFR calc Af Amer: >60 mL/min (ref >60)
GFR calc non Af Amer: >60 mL/min (ref >60)
GLUCOSE: 89 mg/dL (ref 65–99)
Potassium: 2.9 mmol/L — ABNORMAL LOW (ref 3.5–5.1)
Sodium: 145 mmol/L (ref 135–145)
Total Bilirubin: 1.3 mg/dL — ABNORMAL HIGH (ref 0.3–1.2)
Total Protein: 4.7 g/dL — ABNORMAL LOW (ref 6.5–8.1)

## 2017-04-27 LAB — CBC
HCT: 45.8 % (ref 36.0–46.0)
HEMOGLOBIN: 14.6 g/dL (ref 12.0–15.0)
MCH: 30.7 pg (ref 26.0–34.0)
MCHC: 31.9 g/dL (ref 30.0–36.0)
MCV: 96.4 fL (ref 78.0–100.0)
Platelets: 85 10*3/uL — ABNORMAL LOW (ref 150–400)
RBC: 4.75 MIL/uL (ref 3.87–5.11)
RDW: 15.4 % (ref 11.5–15.5)
WBC: 7.6 10*3/uL (ref 4.0–10.5)

## 2017-04-27 LAB — GLUCOSE, CAPILLARY
GLUCOSE-CAPILLARY: 91 mg/dL (ref 65–99)
GLUCOSE-CAPILLARY: 84 mg/dL (ref 65–99)
GLUCOSE-CAPILLARY: 182 mg/dL — AB (ref 65–99)
Glucose-Capillary: 92 mg/dL (ref 65–99)
Glucose-Capillary: 118 mg/dL — ABNORMAL HIGH (ref 65–99)
Glucose-Capillary: 218 mg/dL — ABNORMAL HIGH (ref 65–99)
Glucose-Capillary: 174 mg/dL — ABNORMAL HIGH (ref 65–99)

## 2017-04-27 MED ORDER — POTASSIUM CHLORIDE 10 MEQ/100ML IV SOLN
10.0000 meq | INTRAVENOUS | Status: DC
  Administered 2017-04-27 (×3): 10 meq via INTRAVENOUS
  Filled 2017-04-27 (×3): qty 100

## 2017-04-27 MED ORDER — MIDAZOLAM HCL 2 MG/2ML IJ SOLN
0.5000 mg | INTRAMUSCULAR | Status: DC | PRN
  Administered 2017-04-27 – 2017-04-28 (×2): 0.5 mg via INTRAVENOUS
  Filled 2017-04-27: qty 2

## 2017-04-27 MED ORDER — NITROGLYCERIN 2 % TD OINT
0.5000 [in_us] | TOPICAL_OINTMENT | Freq: Four times a day (QID) | TRANSDERMAL | Status: DC
  Administered 2017-04-27 – 2017-04-28 (×6): 0.5 [in_us] via TOPICAL
  Filled 2017-04-27: qty 30

## 2017-04-27 MED ORDER — MIDAZOLAM HCL 2 MG/2ML IJ SOLN
INTRAMUSCULAR | Status: AC
  Filled 2017-04-27: qty 2

## 2017-04-27 MED ORDER — LISINOPRIL 2.5 MG PO TABS
2.5000 mg | ORAL_TABLET | Freq: Every day | ORAL | Status: DC
  Administered 2017-04-27 – 2017-05-01 (×5): 2.5 mg via ORAL
  Filled 2017-04-27 (×6): qty 1

## 2017-04-27 MED ORDER — PNEUMOCOCCAL VAC POLYVALENT 25 MCG/0.5ML IJ INJ
0.5000 mL | INJECTION | INTRAMUSCULAR | Status: AC
  Administered 2017-04-29: 0.5 mL via INTRAMUSCULAR
  Filled 2017-04-27: qty 0.5

## 2017-04-27 MED ORDER — HALOPERIDOL LACTATE 5 MG/ML IJ SOLN
2.0000 mg | Freq: Four times a day (QID) | INTRAMUSCULAR | Status: DC | PRN
  Administered 2017-04-27: 2 mg via INTRAVENOUS
  Filled 2017-04-27: qty 1

## 2017-04-27 MED ORDER — POTASSIUM CHLORIDE CRYS ER 20 MEQ PO TBCR
40.0000 meq | EXTENDED_RELEASE_TABLET | Freq: Two times a day (BID) | ORAL | Status: AC
  Administered 2017-04-27 (×2): 40 meq via ORAL
  Filled 2017-04-27 (×2): qty 2

## 2017-04-27 NOTE — Progress Notes (Signed)
[place] Progress Note Patient Name: Charlene Rubio DOB: 1945-08-31 MRN: [id number]   Date of Service  04/27/2017  HPI/Events of Note  Nurse reports patient now more agitated and also fearful of physicians/medical care. No known allergies. Last EKG on 9/7 w/ normal QTc but reported calculated QT 0.5 today by RN off monitor. Currently normotensive on NTG paste at 1inch q6hr. Patient continuing to pull off her [place] oxygen and desaturating significantly despite mittens. Patient has remained off BiPAP.   eICU Interventions  1. Changing NTG paste to 0.5 in 2. EKG now 3. Considering Haldol vs Precedex IV to avoid restraints      Intervention Category Major Interventions: Arrhythmia - evaluation and management  Tera Partridge 04/27/2017, 12:07 AM

## 2017-04-27 NOTE — Evaluation (Signed)
Occupational Therapy Evaluation Patient Details Name: Charlene Rubio MRN: [id number] DOB: Mar 13, 1946 Today's Date: 04/27/2017    History of Present Illness 71 yr old lady with suspected dementia, HTN not treated admitted with shortness of breath and bil LE swelling.   Clinical Impression   Pt is a poor historian but reports she was managing ADL independently PTA. Currently pt requires mod assist +2 for functional mobility, min assist for seated UB ADL, and max assist for LB ADL. Pt presenting with cognitive impairments, generalized weakness, deconditioning, decreased activity tolerance impacting her independence and safety with ADL and functional mobility. Recommending SNF for follow up to maximize independence and safety with ADL and functional mobility. Pt would benefit from continued skilled OT to address established goals.    Follow Up Recommendations  SNF;Supervision/Assistance - 24 hour    Equipment Recommendations  Other (comment) (TBD)    Recommendations for Other Services       Precautions / Restrictions Precautions Precautions: Fall Restrictions Weight Bearing Restrictions: No      Mobility Bed Mobility Overal bed mobility: Needs Assistance Bed Mobility: Supine to Sit     Supine to sit: Mod assist     General bed mobility comments: Assist for trunk elevation and scooting hips with use of bed pad. Max cues for sequencing, initiation, and technique for bed mobility.  Transfers Overall transfer level: Needs assistance Equipment used: 2 person hand held assist Transfers: Sit to/from Stand Sit to Stand: Mod assist;+2 physical assistance;+2 safety/equipment         General transfer comment: Mod assist to boost up from EOB and for balance in standing. Cues for sequencing and safety    Balance Overall balance assessment: Needs assistance Sitting-balance support: Feet supported;No upper extremity supported Sitting balance-Leahy Scale: Fair     Standing balance  support: Single extremity supported;During functional activity Standing balance-Leahy Scale: Poor Standing balance comment: Mod assist for standing balance during peri care                           ADL either performed or assessed with clinical judgement   ADL Overall ADL's : Needs assistance/impaired Eating/Feeding: Set up;Sitting   Grooming: Set up;Supervision/safety;Sitting;Brushing hair   Upper Body Bathing: Minimal assistance;Sitting   Lower Body Bathing: Maximal assistance;Sit to/from stand Lower Body Bathing Details (indicate cue type and reason): Pt assisting with washing peri area in standing but still requires max assist overall Upper Body Dressing : Minimal assistance;Sitting Upper Body Dressing Details (indicate cue type and reason): to doff/don gown Lower Body Dressing: Maximal assistance;Sit to/from stand Lower Body Dressing Details (indicate cue type and reason): to don socks Toilet Transfer: Moderate assistance;+2 for physical assistance;+2 for safety/equipment;Ambulation Toilet Transfer Details (indicate cue type and reason): Simulated by transfer EOB to chair with very short distance mobility Toileting- Clothing Manipulation and Hygiene: Maximal assistance;Sit to/from stand       Functional mobility during ADLs: Moderate assistance;+2 for physical assistance;+2 for safety/equipment [facility])       Vision         Perception     Praxis      Pertinent Vitals/Pain Pain Assessment: No/denies pain     Hand Dominance Right   Extremity/Trunk Assessment Upper Extremity Assessment Upper Extremity Assessment: Generalized weakness   Lower Extremity Assessment Lower Extremity Assessment: Defer to PT evaluation       Communication Communication Communication: No difficulties   Cognition Arousal/Alertness: Awake/alert Behavior During Therapy: Flat affect Overall Cognitive  Status: No family/caregiver present to determine baseline cognitive  functioning Area of Impairment: Orientation;Attention;Memory;Following commands;Safety/judgement;Awareness;Problem solving                 Orientation Level: Disoriented to;Time;Situation Current Attention Level: Focused Memory: Decreased short-term memory Following Commands: Follows one step commands inconsistently;Follows one step commands with increased time Safety/Judgement: Decreased awareness of safety;Decreased awareness of deficits Awareness: Intellectual Problem Solving: Slow processing;Decreased initiation;Difficulty sequencing;Requires verbal cues;Requires tactile cues General Comments: Suspected hx of dementia; no family present to confirm baseline   General Comments  HR 141 max; SpO2 91% on RA with activity-reapplied 3L O2.    Exercises     Shoulder Instructions      Home Living Family/patient expects to be discharged to:: Skilled nursing facility Living Arrangements: Spouse/significant other Available Help at Discharge: Family Type of Home: House             Bathroom Shower/Tub: Tub/shower unit             Additional Comments: Pt provided information-unsure of accuracy      Prior Functioning/Environment Level of Independence: Independent        Comments: Pt is a poor historian; unsure of accuracy of information.        OT Problem List: Decreased strength;Decreased activity tolerance;Impaired balance (sitting and/or standing);Decreased cognition;Decreased safety awareness;Decreased knowledge of use of DME or AE;Decreased knowledge of precautions;Cardiopulmonary status limiting activity;Obesity;Increased edema      OT Treatment/Interventions: Self-care/ADL training;Therapeutic exercise;Energy conservation;DME and/or AE instruction;Therapeutic activities;Cognitive remediation/compensation;Patient/family education;Balance training    OT Goals(Current goals can be found in the care plan section) Acute Rehab OT Goals Patient Stated Goal: none  stated OT Goal Formulation: With patient Time For Goal Achievement: 05/11/17 Potential to Achieve Goals: Good ADL Goals Pt Will Perform Lower Body Bathing: with min assist;sit to/from stand Pt Will Perform Lower Body Dressing: with min assist;sit to/from stand Pt Will Transfer to Toilet: with min assist;ambulating;bedside commode Pt Will Perform Toileting - Clothing Manipulation and hygiene: with min assist;sit to/from stand  OT Frequency: Min 2X/week   Barriers to D/C:            Co-evaluation PT/OT/SLP Co-Evaluation/Treatment: Yes Reason for Co-Treatment: Necessary to address cognition/behavior during functional activity;For patient/therapist safety   OT goals addressed during session: ADL's and self-care      AM-PAC PT "6 Clicks" Daily Activity     Outcome Measure Help from another person eating meals?: None Help from another person taking care of personal grooming?: A Little Help from another person toileting, which includes using toliet, bedpan, or urinal?: A Lot Help from another person bathing (including washing, rinsing, drying)?: A Lot Help from another person to put on and taking off regular upper body clothing?: A Little Help from another person to put on and taking off regular lower body clothing?: A Lot 6 Click Score: 16   End of Session Equipment Utilized During Treatment: Gait belt;Oxygen Nurse Communication: Mobility status  Activity Tolerance: Patient tolerated treatment well Patient left: in chair;with call bell/phone within reach;with chair alarm set;with nursing/sitter in room  OT Visit Diagnosis: Unsteadiness on feet (R26.81);Other abnormalities of gait and mobility (R26.89);History of falling (Z91.81);Muscle weakness (generalized) (M62.81)                Time: 9242-6834 OT Time Calculation (min): 27 min Charges:  OT General Charges $OT Visit: 1 Visit OT Evaluation $OT Eval Moderate Complexity: 1 Mod G-Codes:     Pacey Altizer A. Ulice Brilliant, M.S.,  OTR/L Pager: [phone]  Binnie Kand 04/27/2017, 12:07 PM

## 2017-04-27 NOTE — NC FL2 (Signed)
[place] LEVEL OF CARE SCREENING TOOL     IDENTIFICATION  Patient Name: Charlene Rubio Birthdate: 1945/12/05 Sex: female Admission Date (Current Location): 04/25/2017  [facility] and [place] Number:  Herbalist and Address:  [facility], [place] [address] [place], [place] [address]      Provider Number: [id number]  Attending Physician Name and Address:  Chesley Mires, MD  Relative Name and Phone Number:       Current Level of Care: Hospital Recommended Level of Care: [place] Prior Approval Number:    Date Approved/Denied:   PASRR Number: [id number]  Discharge Plan: SNF    Current Diagnoses: Patient Active Problem List   Diagnosis Date Noted  . DOE (dyspnea on exertion) 04/25/2017  . Transient alteration of awareness 04/25/2017  . Bilateral leg edema 04/25/2017  . Acquired leg deformity 04/25/2017  . Thyroid disease 04/25/2017  . Noncompliance 04/25/2017  . Anxiety about health 04/25/2017  . Dehydration 04/25/2017  . CHF (congestive heart failure) ([place]) 04/25/2017  . Acute respiratory failure with hypoxia and hypercapnia ([place]) 04/25/2017  . Acute diastolic (congestive) heart failure ([place]) 04/25/2017  . Diabetes mellitus ([place]) 04/25/2017  . Hearing loss secondary to cerumen impaction 12/23/2012  . KNEE PAIN, LEFT 04/06/2010  . Obesity, unspecified 07/01/2007  . Aortic valve disorder 07/01/2007  . ALLERGIC RHINITIS 07/01/2007  . Essential hypertension 07/01/2007    Orientation RESPIRATION BLADDER Height & Weight     Self, Place  O2 ([place] 2L) External catheter Weight: 169 lb 1.5 oz (76.7 kg) Height:  5' (152.4 cm)  BEHAVIORAL SYMPTOMS/MOOD NEUROLOGICAL BOWEL NUTRITION STATUS       (unknown) Diet (cardiac)  AMBULATORY STATUS COMMUNICATION OF NEEDS Skin   Extensive Assist Verbally Normal                       Personal Care Assistance Level of Assistance  Bathing, Dressing Bathing Assistance:  Maximum assistance   Dressing Assistance: Maximum assistance     Functional Limitations Info             SPECIAL CARE FACTORS FREQUENCY  PT (By licensed PT), OT (By licensed OT)     PT Frequency: 5x/wk OT Frequency: 5x/wk            Contractures      Additional Factors Info  Code Status, Allergies Code Status Info: DNR Allergies Info: NKA           Current Medications (04/27/2017):  This is the current hospital active medication list Current Facility-Administered Medications  Medication Dose Route Frequency Provider Last Rate Last Dose  . 0.9 %  sodium chloride infusion  250 mL Intravenous PRN Aldean Jewett, MD 20 mL/hr at 04/27/17 0820 250 mL at 04/27/17 0820  . aspirin chewable tablet 324 mg  324 mg Oral Once Charlesetta Shanks, MD   Stopped at 04/25/17 2107  . chlorhexidine (PERIDEX) 0.12 % solution 15 mL  15 mL Mouth Rinse BID Aldean Jewett, MD   15 mL at 04/27/17 0930  . enoxaparin (LOVENOX) injection 30 mg  30 mg Subcutaneous Q24H Aldean Jewett, MD   30 mg at 04/27/17 0929  . famotidine (PEPCID) IVPB 20 mg premix  20 mg Intravenous Q24H Magdalen Spatz, NP   Stopped at 04/27/17 1000  . furosemide (LASIX) injection 40 mg  40 mg Intravenous Q12H Aldean Jewett, MD   40 mg at 04/27/17 0905  .  hydrALAZINE (APRESOLINE) injection 10 mg  10 mg Intravenous Q4H PRN Aljishi, Wael Z, MD      . insulin aspart (novoLOG) injection 2-6 Units  2-6 Units Subcutaneous Q4H Aljishi, Wael Z, MD      . insulin glargine (LANTUS) injection 10 Units  10 Units Subcutaneous Q24H Aldean Jewett, MD   Stopped at 04/27/17 0910  . lisinopril (PRINIVIL,ZESTRIL) tablet 2.5 mg  2.5 mg Oral Daily Magdalen Spatz, NP   2.5 mg at 04/27/17 0929  . MEDLINE mouth rinse  15 mL Mouth Rinse q12n4p Aljishi, Virgina Norfolk, MD   15 mL at 04/27/17 1230  . midazolam (VERSED) injection 0.5 mg  0.5 mg Intravenous Q4H PRN Raylene Miyamoto, MD   0.5 mg at 04/27/17 1433  . nitroGLYCERIN (NITROGLYN) 2 % ointment 0.5  inch  0.5 inch Topical Q6H Javier Glazier, MD   0.5 inch at 04/27/17 1230  . [START ON 04/28/2017] pneumococcal 23 valent vaccine (PNU-IMMUNE) injection 0.5 mL  0.5 mL Intramuscular Tomorrow-1000 Sood, Vineet, MD      . potassium chloride SA (K-DUR,KLOR-CON) CR tablet 40 mEq  40 mEq Oral BID Magdalen Spatz, NP   40 mEq at 04/27/17 2947     Discharge Medications: Please see discharge summary for a list of discharge medications.  Relevant Imaging Results:  Relevant Lab Results:   Additional Information SS#: [id number]  Geralynn Ochs, LCSW

## 2017-04-27 NOTE — Progress Notes (Signed)
[facility] ADULT ICU REPLACEMENT PROTOCOL FOR AM LAB REPLACEMENT ONLY  The patient does apply for the [facility] Adult ICU Electrolyte Replacment Protocol based on the criteria listed below:   1. Is GFR >/= 40 ml/min? Yes.    Patient's GFR today is >60 2. Is urine output >/= 0.5 ml/kg/hr for the last 6 hours? Yes.  Patient's UOP is 2.0 ml/kg/hr 3. Is BUN < 60 mg/dL? Yes.    Patient's BUN today is 20 4. Abnormal electrolyte(s): K+2.9 5. Ordered repletion with: Protocol 6. If a panic level lab has been reported, has the CCM MD in charge been notified? No..   Physician:  Rozelle Logan, [place] 04/27/2017 5:53 AM

## 2017-04-27 NOTE — Progress Notes (Signed)
[place] Progress Note Patient Name: Charlene Rubio DOB: 1945/11/19 MRN: [id number]   Date of Service  04/27/2017  HPI/Events of Note  Unable to obtain EKG due to combativeness. Patient with QT 474ms on monitor correlating with prior EKG. No known allergies.  eICU Interventions  1. Continuing QTc monitoring on telemetry 2. Haldol 2mg  IV q6hr prn agitation      Intervention Category Major Interventions: Delirium, psychosis, severe agitation - evaluation and management  Tera Partridge 04/27/2017, 12:14 AM

## 2017-04-27 NOTE — Progress Notes (Signed)
[facility] / CRITICAL CARE MEDICINE   Name: KRYSTINE PABST MRN: [id number] DOB: 16-Sep-1945    ADMISSION DATE:  04/25/2017 CONSULTATION DATE: 04/25/2017   REFERRING MD:  Charlesetta Shanks  CHIEF COMPLAINT: Shortness of breath  HISTORY OF PRESENT ILLNESS:   71 yr old lady with suspected dementia, HTN not treated admitted with shortness of breath. Per husband she has been short of breath for 3 weeks progressing with increase lower limb swelling. Over the last few days she has been bed bound and not able to walk from shortness of breath. He states that she has severe anxiety from doctors and she is worried that she will be diagnosed with dementia and her husband has noticed that she has been very forgetful. She had no history of fever, chills, cough or sputum. She was diagnosed with HTN a while ago but refused to use her medicines.  In the ED she was found to have bilateral infiltrates/pulm edema and severe hyperglycemia. She was treated with BiPAP. She remained  lethargic but was  easily arousable.   PAST MEDICAL HISTORY :  She  has a past medical history of Allergy; Aortic insufficiency; Borderline diabetic; Glaucoma suspect; Hypertension; Obesity; and Thyroid disease.   SUBJECTIVE:  Overnight issues with agitation/combativeness. QTc per 12 Lead < 0.5: Haldol started with Qtc monitoring. Rested well after. Remained off BiPap over night.She follows commands but does not know year, place or president.  VITAL SIGNS: BP 124/67   Pulse 83   Temp 97.6 F (36.4 C) (Oral)   Resp (!) 29   Ht 5' (1.524 m)   Wt 169 lb 1.5 oz (76.7 kg)   SpO2 94%   BMI 33.02 kg/m   HEMODYNAMICS:    VENTILATOR SETTINGS:    INTAKE / OUTPUT: I/O last 3 completed shifts: In: 1461 [I.V.:61; IV Piggyback:1400] Out: 64 [Urine:4600]  PHYSICAL EXAMINATION: General:  Obese, elderly female on 4 L [place], confused,  following commands, appears older than stated age Neuro:  Awake and alert, following commands,  confused HEENT:  Atraumatic, normocephalic, [place], MM pink and moist Cardiovascular: S1, S2, RRR, No MRG Lungs: Bilateral coarse , few crackles with fine wheezing noted  Abdomen:  Obese, non-tender, non-distended Musculoskeletal: Bilateral edema, no obvious deformities Skin:Thin, frail, no rash, lesions, bruising  LABS:  BMET  Recent Labs Lab 04/26/17 0455 04/26/17 1430 04/27/17 0337  NA 144 145 145  K 3.2* 3.5 2.9*  CL 105 103 101  CO2 28 32 32  BUN 25* 23* 20  CREATININE 1.11* 0.99 0.88  GLUCOSE 145* 121* 89    Electrolytes  Recent Labs Lab 04/26/17 0455 04/26/17 1430 04/27/17 0337  CALCIUM 8.4* 8.5* 7.9*    CBC  Recent Labs Lab 04/25/17 2306 04/26/17 0455 04/27/17 0337  WBC 9.9 8.6 7.6  HGB 16.0* 15.0 14.6  HCT 48.8* 47.0* 45.8  PLT 86* 81* 85*    Coag's No results for input(s): APTT, INR in the last 168 hours.  Sepsis Markers  Recent Labs Lab 04/25/17 1825  LATICACIDVEN 3.12*    ABG  Recent Labs Lab 04/25/17 2026  PHART 7.229*  PCO2ART 65.2*  PO2ART 471.0*    Liver Enzymes  Recent Labs Lab 04/25/17 1310 04/27/17 0337  AST 33 30  ALT 42 34  ALKPHOS 63 46  BILITOT 1.8* 1.3*  ALBUMIN 3.9 2.8*    Cardiac Enzymes  Recent Labs Lab 04/26/17 0943  TROPONINI 0.37*    Glucose  Recent Labs Lab 04/26/17 1113 04/26/17 1523 04/26/17 2044 04/27/17  0104 04/27/17 0401 04/27/17 0737  GLUCAP 100* 109* 88 91 84 92    Imaging Dg Chest Port 1 View  Result Date: 04/27/2017 CLINICAL DATA:  Respiratory failure EXAM: PORTABLE CHEST 1 VIEW COMPARISON:  92,018 FINDINGS: Bilateral airspace disease unchanged. Left lower lobe atelectasis and effusion unchanged. Small right effusion. IMPRESSION: No significant interval change. Probable heart failure with pulmonary edema and pleural effusions. Electronically Signed   By: Franchot Gallo M.D.   On: 04/27/2017 07:19     STUDIES:  9/7 CXR>> Bilateral pulmonary edema 9/8 Echo>> EF 02-54%,  Systolic function severely reduced,Grade 1 diastolic dysfunction, Aortic valve >> moderate regurgitation, MV>> Mild regurg, TV>> Mild regurg, PAP>> 15mm Hg CULTURES: 9/7>> Blood x 2  ANTIBIOTICS: Azithro 9/7>> Rocephin 9/7>>  SIGNIFICANT EVENTS: 9/7 Admission  LINES/TUBES: PIV 9/7>>  DISCUSSION: 71 yr old lady with suspected dementia, HTN not treated who is coming in with shortness of breath for 3 weeks, progressing with increase lower limb swelling. Bed bound due to shortness of breath.She has severe anxiety per husband  and she is worried that she will be diagnosed with dementia as she is very forgetful. She had no history of fever, chills, cough or sputum. She was diagnosed with HTN but is non-compliant with medications.CXR on admission shows pulmonary edema. Additionally she is hyperglycemic. She was placed on BiPAP, treated with Lasix, and placed on an insulin gtt . Per her husband, she would not want CPR or life support. She was made a DNR, with hope to manage medically.CXR on admission  indicates heart failure. BNP on admission was > 4500.  ASSESSMENT / PLAN:  PULMONARY A:Acute hypoxemic hypercapnic respiratory failure requiring non invasive mechanical ventilation  CXR 9/8>> Worsening heart failure with increase in edema and left effusion. Progression of left lower lobe atelectasis. CXR 9/9>> HF with effusions P:  BiPAP prn  Titrate FIO2 / Oxygen for saturations > 94% Titrate IPAP for volumes > 400 cc CXR daily Albuterol prn Continue Lasix 40 IV Q 12 as renal function allows Mobilize Aggressive pulmonary toilet  CARDIOVASCULAR A: Acute Heart Failure ( Not previously diagnosed) SVT ( Short Runs) Echo 9/8>> EF 25-30% Net neg>> 4 L last 24 hours P: Start low dose Lisinopril for HF Consider adding BB 9/10 if tolerates ACEI Telemetry monitoring  Maintain MAP > 65 BNP 9/10 12 Lead EKG prn Troponin Lasix as above as renal function allows CXR 9/9 Apresoline prn SBP>  150 Nitro Paste QTc monitoring( Haldol)  RENAL A:   AKI Hypokalemia P:   Trend BMET Mag Phos Replete Electrolytes as needed ( K+ repleted 9/8, 9/9) Avoid Nephrotoxic drugs Maintain renal perfusion Strict I&O   GASTROINTESTINAL A:   No Acute Issues P:   NPO except sips for meds YHC:WCBJSE Q 24    HEMATOLOGIC A:   Thrombocytopenia.. Slowly improving P:  Trend CBC Transfuse for HGB <7 DVT prophylaxis: PAS Hose Consider discontinuing Lovenox   INFECTIOUS A:   No Acute issues Afebrile P:   Trend fever/ WBC Curve Consider  PCT/Lactate Cultures as above ABX as above Follow micro for results Consider discontinuing antibiotics  ENDOCRINE A:   Hyperglycemia Suspect new diagnosis DM   Obesity HGB A1C 7.8 P:  Insulin gtt d/c'd 9/8 CBG Q 4 SSI per phase 3  HGB A1C  NEUROLOGIC A:   ? Dementia AMS P:   RASS goal: 0 to 1 Neuro Checks per unit protocol Minimize sedation Haldol as needed for agitation.. QTc monitoring    FAMILY  -  Updates: No family at bedside to provide with an update 9/9  Will place PT/OT consults as patient will benefit. Social Work consult as most likely will need SNF placement once ready for DC. Husband is exhausted.  - Inter-disciplinary family meet or Palliative Care meeting due by:  9/14/ 2018   Magdalen Spatz, AGACNP-BC [place] Pager: [phone]  04/27/2017, 7:50 AM   STAFF NOTE: I, Merrie Roof, MD FACP have personally reviewed patient's available data, including medical history, events of note, physical examination and test results as part of my evaluation. I have discussed with resident/NP and other care providers such as pharmacist, RN and RRT. In addition, I personally evaluated patient and elicited key findings of: awake, smiling, mild RR elevation, crackles improved, JVD down but remains, edema present 2 plus , pcxr which I reviewed shows bilateral pulm  edema sligh better aerated today, she presents with NEW sys CHF, she responded well to lasix , renal function tolerated well, she does not need NIMV any further as she has improved resp status, dc NIMV, keep tele, keep lasix, supp k , lytes in am , kvo, would avoid haldol with new chf and risk torsades, keep tele, needs cards assessmentn in am, may need long acting insulin, I updated th ept  Lavon Paganini. Titus Mould, MD, [place] Pgr: [facility] 04/27/2017 9:44 AM

## 2017-04-27 NOTE — Progress Notes (Signed)
Dr. Ashok Cordia notified of pt's continuing to pull off nasal cannula with resulting low oxygen levels.  Pt confused and refuses EKG per order.  [place] notified.  QT calculation made visible on bedside monitor for Dr. Ashok Cordia to view.  Orders received.  Will continue to closely monitor.

## 2017-04-27 NOTE — Progress Notes (Signed)
[place] Progress Note Patient Name: Charlene Rubio DOB: 1945-10-30 MRN: [id number]   Date of Service  04/27/2017  HPI/Events of Note  Camera check on patient. Over 1 hour post IV haldol. Sleeping comfortably with mittens in place & on nasal cannula. No distress. Saturation 98% with resp rate showing normal work of breathing. BP & HR stable.  eICU Interventions  Continuing Haldol IV prn     Intervention Category Major Interventions: Delirium, psychosis, severe agitation - evaluation and management  Tera Partridge 04/27/2017, 1:57 AM

## 2017-04-27 NOTE — Evaluation (Signed)
Physical Therapy Evaluation Patient Details Name: Charlene Rubio MRN: [id number] DOB: [birth year] Today's Date: 04/27/2017   History of Present Illness  71 yr old lady with suspected dementia, HTN not treated admitted with shortness of breath and bil LE swelling.  Clinical Impression  Pt admitted with above diagnosis. Pt currently with functional limitations due to the deficits listed below (see PT Problem List). Per pt report, she was independent prior to admission; This will need to be verified, as she is a poor historian; Presents with weakness, decr functional capacity, and decr cognition;  Pt will benefit from skilled PT to increase their independence and safety with mobility to allow discharge to the venue listed below.       Follow Up Recommendations SNF    Equipment Recommendations  Rolling walker with 5" wheels;3in1 (PT)    Recommendations for Other Services       Precautions / Restrictions Precautions Precautions: Fall Restrictions Weight Bearing Restrictions: No      Mobility  Bed Mobility Overal bed mobility: Needs Assistance Bed Mobility: Supine to Sit     Supine to sit: Mod assist     General bed mobility comments: Assist for trunk elevation and scooting hips with use of bed pad. Max cues for sequencing, initiation, and technique for bed mobility.  Transfers Overall transfer level: Needs assistance Equipment used: 2 person hand held assist Transfers: Sit to/from Stand Sit to Stand: Mod assist;+2 physical assistance;+2 safety/equipment         General transfer comment: Mod assist to boost up from EOB and for balance in standing. Cues for sequencing and safety  Ambulation/Gait Ambulation/Gait assistance: Min assist;+2 safety/equipment;+2 physical assistance Ambulation Distance (Feet): 4 Feet Assistive device: 2 person hand held assist Gait Pattern/deviations: Decreased step length - right;Decreased step length - left     General Gait Details: Cues to  self-monitor for activity tolerance  Stairs            Wheelchair Mobility    Modified Rankin (Stroke Patients Only)       Balance Overall balance assessment: Needs assistance Sitting-balance support: Feet supported;No upper extremity supported Sitting balance-Leahy Scale: Fair     Standing balance support: Single extremity supported;During functional activity Standing balance-Leahy Scale: Poor Standing balance comment: Mod assist for standing balance during peri care                             Pertinent Vitals/Pain Pain Assessment: No/denies pain    Home Living Family/patient expects to be discharged to:: Skilled nursing facility Living Arrangements: Spouse/significant other Available Help at Discharge: Family Type of Home: House           Additional Comments: Pt provided information-unsure of accuracy    Prior Function Level of Independence: Independent         Comments: Pt is a poor historian; unsure of accuracy of information.     Hand Dominance   Dominant Hand: Right    Extremity/Trunk Assessment   Upper Extremity Assessment Upper Extremity Assessment: Defer to OT evaluation    Lower Extremity Assessment Lower Extremity Assessment: Generalized weakness       Communication   Communication: No difficulties  Cognition Arousal/Alertness: Awake/alert Behavior During Therapy: Flat affect Overall Cognitive Status: No family/caregiver present to determine baseline cognitive functioning Area of Impairment: Orientation;Attention;Memory;Following commands;Safety/judgement;Awareness;Problem solving                 Orientation Level: Disoriented to;Time;Situation Current  Attention Level: Focused Memory: Decreased short-term memory Following Commands: Follows one step commands inconsistently;Follows one step commands with increased time Safety/Judgement: Decreased awareness of safety;Decreased awareness of deficits Awareness:  Intellectual Problem Solving: Slow processing;Decreased initiation;Difficulty sequencing;Requires verbal cues;Requires tactile cues General Comments: Suspected hx of dementia; no family present to confirm baseline      General Comments General comments (skin integrity, edema, etc.): HR 141 max; SpO2 91% on RA with activity-reapplied 3L O2.    Exercises     Assessment/Plan    PT Assessment Patient needs continued PT services  PT Problem List Decreased strength;Decreased activity tolerance;Decreased balance;Decreased mobility;Decreased coordination;Decreased cognition;Decreased knowledge of use of DME;Decreased safety awareness;Decreased knowledge of precautions;Cardiopulmonary status limiting activity       PT Treatment Interventions DME instruction;Gait training;Functional mobility training;Therapeutic activities;Therapeutic exercise;Balance training;Neuromuscular re-education;Cognitive remediation;Patient/family education    PT Goals (Current goals can be found in the Care Plan section)  Acute Rehab PT Goals Patient Stated Goal: none stated PT Goal Formulation: Patient unable to participate in goal setting Time For Goal Achievement: 05/11/17 Potential to Achieve Goals: Good    Frequency Min 2X/week   Barriers to discharge        Co-evaluation PT/OT/SLP Co-Evaluation/Treatment: Yes Reason for Co-Treatment: Necessary to address cognition/behavior during functional activity;For patient/therapist safety PT goals addressed during session: Mobility/safety with mobility OT goals addressed during session: ADL's and self-care       AM-PAC PT "6 Clicks" Daily Activity  Outcome Measure Difficulty turning over in bed (including adjusting bedclothes, sheets and blankets)?: A Lot Difficulty moving from lying on back to sitting on the side of the bed? : A Lot Difficulty sitting down on and standing up from a chair with arms (e.g., wheelchair, bedside commode, etc,.)?: A Lot Help  needed moving to and from a bed to chair (including a wheelchair)?: A Lot Help needed walking in hospital room?: A Lot Help needed climbing 3-5 steps with a railing? : A Lot 6 Click Score: 12    End of Session Equipment Utilized During Treatment: Gait belt Activity Tolerance: Patient tolerated treatment well Patient left: in chair;with call bell/phone within reach;with chair alarm set Nurse Communication: Mobility status PT Visit Diagnosis: Unsteadiness on feet (R26.81);Other abnormalities of gait and mobility (R26.89);Muscle weakness (generalized) (M62.81)    Time: 7902-4097 PT Time Calculation (min) (ACUTE ONLY): 28 min   Charges:   PT Evaluation $PT Eval Moderate Complexity: 1 Mod     PT G Codes:        Roney Marion, PT  Acute Rehabilitation Services Pager [phone] Office [phone]   [place] 04/27/2017, 2:50 PM

## 2017-04-27 NOTE — Progress Notes (Signed)
Patient extremely agitated and getting combative.  Dr. Titus Mould made aware.  Order received for .5mg  Versed IV every 4 hours as needed for agitation.  Patient finally calm with Versed.  Will continue to monitor.

## 2017-04-27 NOTE — Progress Notes (Signed)
[name] Progress Note Patient Name: Charlene Rubio DOB: 1946/04/30 MRN: [id number]   Date of Service  04/27/2017  HPI/Events of Note  Delirium - Patient has episodes of becoming extremely combative requiring Versed IV. Bedside nurse not comfortable with transfer to telemetry bed.   eICU Interventions  Will change transfer to stepdown bed.      Intervention Category Major Interventions: Delirium, psychosis, severe agitation - evaluation and management  Sommer,Steven Eugene 04/27/2017, 4:32 PM

## 2017-04-27 NOTE — Progress Notes (Signed)
[facility] notified of pts potassium level of 2.9.  Will await orders and continue to monitor carefully.

## 2017-04-28 ENCOUNTER — Inpatient Hospital Stay (HOSPITAL_COMMUNITY): Payer: Medicare Other

## 2017-04-28 LAB — CBC
HEMATOCRIT: 47.2 % — AB (ref 36.0–46.0)
HEMOGLOBIN: 15.5 g/dL — AB (ref 12.0–15.0)
MCH: 31.3 pg (ref 26.0–34.0)
MCHC: 32.8 g/dL (ref 30.0–36.0)
MCV: 95.4 fL (ref 78.0–100.0)
Platelets: 114 10*3/uL — ABNORMAL LOW (ref 150–400)
RBC: 4.95 MIL/uL (ref 3.87–5.11)
RDW: 15.4 % (ref 11.5–15.5)
WBC: 9.3 10*3/uL (ref 4.0–10.5)

## 2017-04-28 LAB — PHOSPHORUS: PHOSPHORUS: 3.5 mg/dL (ref 2.5–4.6)

## 2017-04-28 LAB — GLUCOSE, CAPILLARY
GLUCOSE-CAPILLARY: 179 mg/dL — AB (ref 65–99)
GLUCOSE-CAPILLARY: 197 mg/dL — AB (ref 65–99)
GLUCOSE-CAPILLARY: 91 mg/dL (ref 65–99)
Glucose-Capillary: 233 mg/dL — ABNORMAL HIGH (ref 65–99)
Glucose-Capillary: 70 mg/dL (ref 65–99)
Glucose-Capillary: 105 mg/dL — ABNORMAL HIGH (ref 65–99)
Glucose-Capillary: 124 mg/dL — ABNORMAL HIGH (ref 65–99)
Glucose-Capillary: 316 mg/dL — ABNORMAL HIGH (ref 65–99)

## 2017-04-28 LAB — CULTURE, BLOOD (ROUTINE X 2): SPECIAL REQUESTS: ADEQUATE

## 2017-04-28 LAB — BASIC METABOLIC PANEL
ANION GAP: 10 (ref 5–15)
BUN: 22 mg/dL — ABNORMAL HIGH (ref 6–20)
CHLORIDE: 98 mmol/L — AB (ref 101–111)
CO2: 32 mmol/L (ref 22–32)
Calcium: 8 mg/dL — ABNORMAL LOW (ref 8.9–10.3)
Creatinine, Ser: 0.88 mg/dL (ref 0.44–1.00)
GFR calc non Af Amer: >60 mL/min (ref >60)
Glucose, Bld: 68 mg/dL (ref 65–99)
Potassium: 3.6 mmol/L (ref 3.5–5.1)
SODIUM: 140 mmol/L (ref 135–145)

## 2017-04-28 LAB — T3, FREE: T3, Free: 2.1 pg/mL (ref 2.0–4.4)

## 2017-04-28 LAB — BRAIN NATRIURETIC PEPTIDE: B NATRIURETIC PEPTIDE 5: 1411.7 pg/mL — AB (ref 0.0–100.0)

## 2017-04-28 LAB — MAGNESIUM: MAGNESIUM: 1.8 mg/dL (ref 1.7–2.4)

## 2017-04-28 MED ORDER — ASPIRIN EC 81 MG PO TBEC
81.0000 mg | DELAYED_RELEASE_TABLET | Freq: Every day | ORAL | Status: DC
  Administered 2017-04-28 – 2017-05-01 (×4): 81 mg via ORAL
  Filled 2017-04-28 (×4): qty 1

## 2017-04-28 MED ORDER — ENOXAPARIN SODIUM 40 MG/0.4ML ~~LOC~~ SOLN
40.0000 mg | SUBCUTANEOUS | Status: DC
  Administered 2017-04-29 – 2017-04-30 (×2): 40 mg via SUBCUTANEOUS
  Filled 2017-04-28 (×2): qty 0.4

## 2017-04-28 MED ORDER — METOPROLOL TARTRATE 12.5 MG HALF TABLET
12.5000 mg | ORAL_TABLET | Freq: Two times a day (BID) | ORAL | Status: DC
  Administered 2017-04-28 (×2): 12.5 mg via ORAL
  Filled 2017-04-28 (×3): qty 1

## 2017-04-28 MED ORDER — INSULIN ASPART 100 UNIT/ML ~~LOC~~ SOLN
0.0000 [IU] | Freq: Three times a day (TID) | SUBCUTANEOUS | Status: DC
  Administered 2017-04-29: 2 [IU] via SUBCUTANEOUS
  Administered 2017-04-29 – 2017-05-01 (×3): 1 [IU] via SUBCUTANEOUS
  Administered 2017-05-01: 3 [IU] via SUBCUTANEOUS

## 2017-04-28 MED ORDER — DEXTROSE 50 % IV SOLN
INTRAVENOUS | Status: AC
  Filled 2017-04-28: qty 50

## 2017-04-28 MED ORDER — ISOSORBIDE MONONITRATE ER 30 MG PO TB24
30.0000 mg | ORAL_TABLET | Freq: Every day | ORAL | Status: DC
  Administered 2017-04-28: 30 mg via ORAL
  Filled 2017-04-28 (×2): qty 1

## 2017-04-28 MED ORDER — INSULIN ASPART 100 UNIT/ML ~~LOC~~ SOLN
0.0000 [IU] | Freq: Every day | SUBCUTANEOUS | Status: DC
  Administered 2017-04-28: 4 [IU] via SUBCUTANEOUS
  Administered 2017-04-29 – 2017-04-30 (×2): 2 [IU] via SUBCUTANEOUS

## 2017-04-28 NOTE — Progress Notes (Signed)
Removed green safety mittens while patient is resting.

## 2017-04-28 NOTE — Care Management Note (Signed)
Case Management Note  Patient Details  Name: Charlene Rubio MRN: [id number] Date of Birth: 1946-06-12  Subjective/Objective:  From home with spouse,   with progressive SOB over the past 3 weeks with increased LE swelling with acute respiratory failure, CHF requiring BiPap; AKI, AMS. Pt with hx of suspected dementia, HTN, borderline DM                 Action/Plan: NCM will follow along with CSW for dc needs.  Expected Discharge Date:                  Expected Discharge Plan:  Skilled Nursing Facility  In-House Referral:  Clinical Social Work  Discharge planning Services  CM Consult  Post Acute Care Choice:    Choice offered to:     DME Arranged:    DME Agency:     HH Arranged:    [place] Agency:     Status of Service:  In process, will continue to follow  If discussed at Long Length of Stay Meetings, dates discussed:    Additional Comments:  Zenon Mayo, RN 04/28/2017, 11:23 AM

## 2017-04-28 NOTE — Progress Notes (Signed)
[facility] TEAM 1 - Stepdown/ICU TEAM  JULEY GIOVANETTI  XBM:[id number] DOB: 1945-12-12 DOA: 04/25/2017 PCP: [name], DO    Brief Narrative:  71yo F with suspected dementia and HTN who was admitted with 3 weeks of progressive shortness of breath and LE swelling.   In the ED she was found to have pulm edema and severe hyperglycemia. She was treated with BiPAP.   Significant Events: 9/7 admit 9/8 TTE - EF 25-30% - grade 1 DD - mod AoV regurg - mild MV regurg  9/10 [facility] assumed care   Subjective: Pt is alert but confused and unable to provide a reliable hx.  She doe not appear to be in acute resp distress, nor does she appear to have uncontrolled pain.    Assessment & Plan:  Acute hypoxic rep failure w/ pulmonary edema  Has required intermittent BIPAP support - wean off O2 as able - appears to be stabilizing quickly w/ diuresis   Newly diagnosed severe systolic CHF - EF 02-72% No WMA on TTE suggesting probable HTN etiology - she would not be a candidate for aggressive tx given her rapidly progressing dementia, therefore I feel Cardiology consult will not be of benefit to her - titrate meds to find optimal care - dry weight not yet established Filed Weights   04/25/17 2330 04/26/17 0402 04/27/17 0403  Weight: 83 kg (182 lb 15.7 oz) 83 kg (182 lb 15.7 oz) 76.7 kg (169 lb 1.5 oz)    Acute kidney injury  Resolved w/ diuresis and improvement in hemodynamics  Recent Labs Lab 04/26/17 0455 04/26/17 1430 04/27/17 0337 04/27/17 1517 04/28/17 0218  CREATININE 1.11* 0.99 0.88 0.95 0.88   Newly diagnosed DM A1c 7.8 - cont SSI for now - attempt to transition to orals when intake more consistent   Thrombocytopenia  Follow trend   Uncontrolled HTN BP has responded well to medical tx - follow trend w/ ongoing diuresis   Acute delirium - Progressive Dementia  Husband reports a hx of progressive cognitive decline dating back 5-6 years - 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  DVT prophylaxis: lovenox  Code Status: NO CODE - DNR Family Communication: spoke w/ husband at length  Disposition Plan: transfer to tele bed - PT/OT - pt has no SNF benefit so she will have to go home w/ husband   Consultants:  PCCM  Antimicrobials:  Azithro 9/7  Rocephin 9/7   Objective: Blood pressure 106/61, pulse 66, temperature 97.7 F (36.5 C), temperature source Oral, resp. rate (!) 25, height 5' (1.524 m), weight 76.7 kg (169 lb 1.5 oz), SpO2 96 %.  Intake/Output Summary (Last 24 hours) at 04/28/17 1153 Last data filed at 04/28/17 1054  Gross per 24 hour  Intake             1130 ml  Output             2200 ml  Net            -1070 ml   Filed Weights   04/25/17 2330 04/26/17 0402 04/27/17 0403  Weight: 83 kg (182 lb 15.7 oz) 83 kg (182 lb 15.7 oz) 76.7 kg (169 lb 1.5 oz)    Examination: General: No acute respiratory distress Lungs: fine crackles diffusely - no wheezing  Cardiovascular: Regular rate and rhythm without murmur gallop  or rub normal S1 and S2 Abdomen: Nontender, nondistended, soft, bowel sounds positive, no rebound, no ascites, no appreciable mass Extremities: 1+ edema B LE   CBC:  Recent Labs Lab 04/25/17 1310 04/25/17 2306 04/26/17 0455 04/27/17 0337 04/28/17 0218  WBC 9.8 9.9 8.6 7.6 9.3  HGB 16.1* 16.0* 15.0 14.6 15.5*  HCT 48.5* 48.8* 47.0* 45.8 47.2*  MCV 94.7 95.1 96.1 96.4 95.4  PLT 105* 86* 81* 85* 124*   Basic Metabolic Panel:  Recent Labs Lab 04/26/17 0455 04/26/17 1430 04/27/17 0337 04/27/17 1517 04/28/17 0218  NA 144 145 145 142 140  K 3.2* 3.5 2.9* 3.4* 3.6  CL 105 103 101 98* 98*  CO2 28 32 32 34* 32  GLUCOSE 145* 121* 89 195* 68  BUN 25* 23* 20 18 22*  CREATININE 1.11* 0.99 0.88 0.95 0.88  CALCIUM 8.4* 8.5* 7.9* 8.2* 8.0*  MG  --   --   --   --  1.8  PHOS  --   --   --   --  3.5    GFR: Estimated Creatinine Clearance: 52.9 mL/min (by C-G formula based on SCr of 0.88 mg/dL).  Liver Function Tests:  Recent Labs Lab 04/25/17 1310 04/27/17 0337  AST 33 30  ALT 42 34  ALKPHOS 63 46  BILITOT 1.8* 1.3*  PROT 6.1* 4.7*  ALBUMIN 3.9 2.8*    Cardiac Enzymes:  Recent Labs Lab 04/26/17 0943  TROPONINI 0.37*    HbA1C: Hgb A1c MFr Bld  Date/Time Value Ref Range Status  04/26/2017 02:30 PM 7.8 (H) 4.8 - 5.6 % Final    Comment:    (NOTE) Pre diabetes:          5.7%-6.4% Diabetes:              >6.4% Glycemic control for   <7.0% adults with diabetes   10/30/2011 08:30 AM 6.4 4.6 - 6.5 % Final    Comment:    Glycemic Control Guidelines for People with Diabetes:Non Diabetic:  <6%Goal of Therapy: <7%Additional Action Suggested:  >8%     CBG:  Recent Labs Lab 04/27/17 1959 04/27/17 2327 04/28/17 0315 04/28/17 0437 04/28/17 0821  GLUCAP 218* 174* 70 91 105*    Recent Results (from the past 240 hour(s))  Culture, blood (routine x 2)     Status: Abnormal   Collection Time: 04/25/17  5:50 PM  Result Value Ref Range Status   Specimen Description BLOOD RIGHT WRIST  Final   Special Requests   Final    BOTTLES DRAWN AEROBIC AND ANAEROBIC Blood Culture adequate volume   Culture  Setup Time   Final    GRAM POSITIVE COCCI IN CLUSTERS AEROBIC BOTTLE ONLY CRITICAL RESULT CALLED TO, READ BACK BY AND VERIFIED WITH: R RUMBARGER,PHARMD AT 1355 04/26/17 BY L BENFIELD    Culture (A)  Final    STAPHYLOCOCCUS SPECIES (COAGULASE NEGATIVE) THE SIGNIFICANCE OF ISOLATING THIS ORGANISM FROM A SINGLE SET OF BLOOD CULTURES WHEN MULTIPLE SETS ARE DRAWN IS UNCERTAIN. PLEASE NOTIFY THE MICROBIOLOGY DEPARTMENT WITHIN ONE WEEK IF SPECIATION AND SENSITIVITIES ARE REQUIRED.    Report Status 04/28/2017 FINAL  Final  Blood Culture ID Panel (Reflexed)     Status: Abnormal   Collection Time: 04/25/17  5:50 PM  Result Value Ref Range Status   Enterococcus species NOT DETECTED NOT  DETECTED Final   Vancomycin resistance NOT DETECTED NOT DETECTED Final   Listeria monocytogenes NOT DETECTED NOT DETECTED Final   Staphylococcus species DETECTED (A) NOT  DETECTED Final    Comment: CRITICAL RESULT CALLED TO, READ BACK BY AND VERIFIED WITH: R RUMBARGER,PHARMD AT 1155 04/26/17 BY L BENFIELD    Staphylococcus aureus NOT DETECTED NOT DETECTED Final   Methicillin resistance NOT DETECTED NOT DETECTED Final   Streptococcus species NOT DETECTED NOT DETECTED Final   Streptococcus agalactiae NOT DETECTED NOT DETECTED Final   Streptococcus pneumoniae NOT DETECTED NOT DETECTED Final   Streptococcus pyogenes NOT DETECTED NOT DETECTED Final   Acinetobacter baumannii NOT DETECTED NOT DETECTED Final   Enterobacteriaceae species NOT DETECTED NOT DETECTED Final   Enterobacter cloacae complex NOT DETECTED NOT DETECTED Final   Escherichia coli NOT DETECTED NOT DETECTED Final   Klebsiella oxytoca NOT DETECTED NOT DETECTED Final   Klebsiella pneumoniae NOT DETECTED NOT DETECTED Final   Proteus species NOT DETECTED NOT DETECTED Final   Serratia marcescens NOT DETECTED NOT DETECTED Final   Carbapenem resistance NOT DETECTED NOT DETECTED Final   Haemophilus influenzae NOT DETECTED NOT DETECTED Final   Neisseria meningitidis NOT DETECTED NOT DETECTED Final   Pseudomonas aeruginosa NOT DETECTED NOT DETECTED Final   Candida albicans NOT DETECTED NOT DETECTED Final   Candida glabrata NOT DETECTED NOT DETECTED Final   Candida krusei NOT DETECTED NOT DETECTED Final   Candida parapsilosis NOT DETECTED NOT DETECTED Final   Candida tropicalis NOT DETECTED NOT DETECTED Final  Culture, blood (routine x 2)     Status: None (Preliminary result)   Collection Time: 04/25/17  6:12 PM  Result Value Ref Range Status   Specimen Description BLOOD LEFT ANTECUBITAL  Final   Special Requests   Final    BOTTLES DRAWN AEROBIC AND ANAEROBIC Blood Culture adequate volume   Culture NO GROWTH 2 DAYS  Final   Report  Status PENDING  Incomplete  MRSA PCR Screening     Status: None   Collection Time: 04/25/17 10:52 PM  Result Value Ref Range Status   MRSA by PCR NEGATIVE NEGATIVE Final    Comment:        The GeneXpert MRSA Assay (FDA approved for NASAL specimens only), is one component of a comprehensive MRSA colonization surveillance program. It is not intended to diagnose MRSA infection nor to guide or monitor treatment for MRSA infections.      Scheduled Meds: . aspirin  324 mg Oral Once  . chlorhexidine  15 mL Mouth Rinse BID  . dextrose      . enoxaparin (LOVENOX) injection  30 mg Subcutaneous Q24H  . furosemide  40 mg Intravenous Q12H  . insulin aspart  2-6 Units Subcutaneous Q4H  . insulin glargine  10 Units Subcutaneous Q24H  . lisinopril  2.5 mg Oral Daily  . mouth rinse  15 mL Mouth Rinse q12n4p  . nitroGLYCERIN  0.5 inch Topical Q6H  . pneumococcal 23 valent vaccine  0.5 mL Intramuscular Tomorrow-1000     LOS: 3 days   Cherene Altes, MD [facility] Office  [phone] Pager - Text Page per Shea Evans as per below:  On-Call/Text Page:      [email or web address]      password [email or web address]  If 7PM-7AM, please contact night-coverage [email or web address] Password [email or web address] 04/28/2017, 11:53 AM

## 2017-04-28 NOTE — Progress Notes (Signed)
Results for SHIORI, ADCOX (MRN [id number]) as of 04/28/2017 09:20  Ref. Range 04/27/2017 23:27 04/28/2017 03:15 04/28/2017 04:37 04/28/2017 08:21  Glucose-Capillary Latest Ref Range: 65 - 99 mg/dL 174 (H) 70 91 105 (H)  Noted that blood sugars have been less than 100 mg/dl this am.  Recommend discontinuing Lantus 10 units daily.  Continue Novolog 2-6 units every 4 hours per ICU hyperglycemia protocol.  Will continue to follow blood sugars while in the hospital.   Harvel Ricks RN BSN CDE Diabetes Coordinator Pager: [phone]  8am-5pm

## 2017-04-29 ENCOUNTER — Inpatient Hospital Stay (HOSPITAL_COMMUNITY): Payer: Medicare Other

## 2017-04-29 LAB — CBC
HCT: 44.1 % (ref 36.0–46.0)
HEMOGLOBIN: 14.3 g/dL (ref 12.0–15.0)
MCH: 31.3 pg (ref 26.0–34.0)
MCHC: 32.4 g/dL (ref 30.0–36.0)
MCV: 96.5 fL (ref 78.0–100.0)
PLATELETS: 102 10*3/uL — AB (ref 150–400)
RBC: 4.57 MIL/uL (ref 3.87–5.11)
RDW: 15.7 % — ABNORMAL HIGH (ref 11.5–15.5)
WBC: 6.3 10*3/uL (ref 4.0–10.5)

## 2017-04-29 LAB — GLUCOSE, CAPILLARY
GLUCOSE-CAPILLARY: 118 mg/dL — AB (ref 65–99)
GLUCOSE-CAPILLARY: 214 mg/dL — AB (ref 65–99)
Glucose-Capillary: 140 mg/dL — ABNORMAL HIGH (ref 65–99)
Glucose-Capillary: 163 mg/dL — ABNORMAL HIGH (ref 65–99)

## 2017-04-29 LAB — BASIC METABOLIC PANEL
ANION GAP: 7 (ref 5–15)
BUN: 22 mg/dL — ABNORMAL HIGH (ref 6–20)
CHLORIDE: 96 mmol/L — AB (ref 101–111)
CO2: 35 mmol/L — ABNORMAL HIGH (ref 22–32)
CREATININE: 0.91 mg/dL (ref 0.44–1.00)
Calcium: 7.8 mg/dL — ABNORMAL LOW (ref 8.9–10.3)
GFR calc non Af Amer: >60 mL/min (ref >60)
Glucose, Bld: 149 mg/dL — ABNORMAL HIGH (ref 65–99)
POTASSIUM: 3.4 mmol/L — AB (ref 3.5–5.1)
SODIUM: 138 mmol/L (ref 135–145)

## 2017-04-29 MED ORDER — ISOSORBIDE MONONITRATE ER 30 MG PO TB24
15.0000 mg | ORAL_TABLET | Freq: Every day | ORAL | Status: DC
  Administered 2017-04-29 – 2017-05-01 (×3): 15 mg via ORAL
  Filled 2017-04-29 (×2): qty 1

## 2017-04-29 MED ORDER — CARVEDILOL 3.125 MG PO TABS
3.1250 mg | ORAL_TABLET | Freq: Two times a day (BID) | ORAL | Status: DC
  Administered 2017-04-29 – 2017-05-01 (×3): 3.125 mg via ORAL
  Filled 2017-04-29 (×4): qty 1

## 2017-04-29 NOTE — Progress Notes (Signed)
Patient d/c peripheral IV. Patient also removed oxygen. Oxygen saturations WNL on room air. Dr Erlinda Hong paged to see if we can leave IV out as patient continues to attempt to remove devices.

## 2017-04-29 NOTE — Clinical Social Work Note (Signed)
Clinical Social Work Assessment  Patient Details  Name: Charlene Rubio MRN: [id number] Date of Birth: 15-Jun-1946  Date of referral:  04/29/17               Reason for consult:  Facility Placement, Discharge Planning                Permission sought to share information with:  Facility Sport and exercise psychologist, Family Supports Permission granted to share information::  Yes, Verbal Permission Granted  Name::     [name]  Agency::  SNF's  Relationship::  Husband  Contact Information:  [phone]  Housing/Transportation Living arrangements for the past 2 months:  [place] of Information:  Medical Team, Spouse Patient Interpreter Needed:  None Criminal Activity/Legal Involvement Pertinent to Current Situation/Hospitalization:  No - Comment as needed Significant Relationships:  Adult Children, Spouse Lives with:  Spouse Do you feel safe going back to the place where you live?  Yes Need for family participation in patient care:  Yes (Comment)  Care giving concerns:  PT recommending SNF once medically stable for discharge.   Social Worker assessment / plan:  Patient oriented to self only. Spouse at bedside. CSW introduced role and explained that PT recommendations would be discussed. Patient's husband is agreeable to SNF placement. He does not have a preference of facility. SNF list provided for review. Patient's husband concerned and wants information on assistance at home after rehab. CSW explained that the social worker at the SNF will assist him with arranging home health. Patient's husband states that he and the patient cared for her parents at home that had Alzheimer's 25 years ago. No further concerns. CSW encouraged patient's husband to contact CSW as needed. CSW will continue to follow patient and her husband for support and facilitate discharge to SNF once medically stable.  Employment status:  Retired Nurse, adult PT Recommendations:   [place] / Referral to community resources:  [place]  Patient/Family's Response to care:  Patient oriented to self only. Patient's husband agreeable to SNF placement. Patient's family supportive and involved in patient's care. Patient's husband appreciated social work intervention.  Patient/Family's Understanding of and Emotional Response to Diagnosis, Current Treatment, and Prognosis:  Patient oriented to self only. Patient's husband has a good understanding of the reason for admission and her need for rehab prior to returning home. Patient's husband is very happy with hospital care that they are receiving now as well as in the past.  Emotional Assessment Appearance:  Appears stated age Attitude/Demeanor/Rapport:  Unable to Assess, Other (Pleasant) Affect (typically observed):  Unable to Assess, Pleasant Orientation:  Oriented to Self Alcohol / Substance use:  Never Used Psych involvement (Current and /or in the community):  No (Comment)  Discharge Needs  Concerns to be addressed:  Care Coordination Readmission within the last 30 days:  No Current discharge risk:  Cognitively Impaired, Dependent with Mobility Barriers to Discharge:  Continued Medical Work up   Candie Chroman, LCSW 04/29/2017, 12:38 PM

## 2017-04-29 NOTE — Progress Notes (Signed)
Physical Therapy Treatment Patient Details Name: Charlene Rubio MRN: [id number] DOB: 02-Jan-1946 Today's Date: 04/29/2017    History of Present Illness 71 yr old lady with suspected dementia, HTN not treated admitted with shortness of breath and bil LE swelling.    PT Comments    Patient continues to demonstrate cognitive deficits and limited mobility increasing risk for falls. Pt demonstrated some word finding difficulties during session. Pt required mod A (+2 for safety) for ambulation with RW. Pt unable to navigate hallway or room without assistance to direct RW or +2 HHA. Continue to progress as tolerated with anticipated d/c to SNF for further skilled PT services.    Follow Up Recommendations  SNF     Equipment Recommendations  Rolling walker with 5" wheels;3in1 (PT)    Recommendations for Other Services       Precautions / Restrictions Precautions Precautions: Fall    Mobility  Bed Mobility Overal bed mobility: Needs Assistance Bed Mobility: Supine to Sit     Supine to sit: Mod assist     General bed mobility comments: assist to elevate trunk into sitting and bring hips toward EOB; use of chuck pad; verbal and tactile cues for sequencing  Transfers Overall transfer level: Needs assistance Equipment used: Rolling walker (2 wheeled) Transfers: Sit to/from Stand Sit to Stand: Mod assist;+2 physical assistance         General transfer comment: assist to power up into standing; cues for safe hand placement and use of AD; assist to balance upon stand  Ambulation/Gait Ambulation/Gait assistance: +2 physical assistance;Mod assist Ambulation Distance (Feet): 50 Feet Assistive device: 2 person hand held assist;Rolling walker (2 wheeled) Gait Pattern/deviations: Decreased step length - right;Decreased step length - left;Trunk flexed Gait velocity: decreased   General Gait Details: verbal and tactile cues for posture/forward gaze and increased bilat step lengths; assist  for balance and management of RW; pt unable to navigate objects in room or hallway    Stairs            Wheelchair Mobility    Modified Rankin (Stroke Patients Only)       Balance Overall balance assessment: Needs assistance Sitting-balance support: Feet supported;No upper extremity supported Sitting balance-Leahy Scale: Fair     Standing balance support: During functional activity;Bilateral upper extremity supported Standing balance-Leahy Scale: Poor                              Cognition Arousal/Alertness: Awake/alert Behavior During Therapy: Restless Overall Cognitive Status: No family/caregiver present to determine baseline cognitive functioning Area of Impairment: Orientation;Attention;Memory;Following commands;Safety/judgement;Awareness;Problem solving                 Orientation Level: Disoriented to;Time;Situation;Place Current Attention Level: Focused Memory: Decreased short-term memory Following Commands: Follows one step commands inconsistently;Follows one step commands with increased time Safety/Judgement: Decreased awareness of safety;Decreased awareness of deficits Awareness: Intellectual Problem Solving: Slow processing;Decreased initiation;Difficulty sequencing;Requires verbal cues;Requires tactile cues        Exercises      General Comments        Pertinent Vitals/Pain Pain Assessment: No/denies pain    Home Living                      Prior Function            PT Goals (current goals can now be found in the care plan section) Acute Rehab PT Goals PT Goal Formulation:  Patient unable to participate in goal setting Time For Goal Achievement: 05/11/17 Potential to Achieve Goals: Good Progress towards PT goals: Progressing toward goals    Frequency    Min 2X/week      PT Plan Current plan remains appropriate    Co-evaluation              AM-PAC PT "6 Clicks" Daily Activity  Outcome Measure   Difficulty turning over in bed (including adjusting bedclothes, sheets and blankets)?: A Lot Difficulty moving from lying on back to sitting on the side of the bed? : Unable Difficulty sitting down on and standing up from a chair with arms (e.g., wheelchair, bedside commode, etc,.)?: Unable Help needed moving to and from a bed to chair (including a wheelchair)?: A Lot Help needed walking in hospital room?: A Lot Help needed climbing 3-5 steps with a railing? : A Lot 6 Click Score: 10    End of Session Equipment Utilized During Treatment: Gait belt Activity Tolerance: Patient tolerated treatment well Patient left: in chair;with call bell/phone within reach;with chair alarm set;with nursing/sitter in room Nurse Communication: Mobility status PT Visit Diagnosis: Unsteadiness on feet (R26.81);Other abnormalities of gait and mobility (R26.89);Muscle weakness (generalized) (M62.81)     Time: 2563-8937 PT Time Calculation (min) (ACUTE ONLY): 33 min  Charges:  $Gait Training: 8-22 mins $Therapeutic Activity: 8-22 mins                    G Codes:       Earney Navy, PTA Pager: [phone]     Darliss Cheney 04/29/2017, 4:23 PM

## 2017-04-29 NOTE — Progress Notes (Signed)
PROGRESS NOTE  Charlene Rubio YFV:[id number] DOB: Mar 08, 1946 DOA: 04/25/2017 PCP: [name], DO  Brief Summary:  71 yr old lady with suspected dementia, HTN not treated who is coming in with shortness of breath. As per husband she has been short of breath for 3 weeks progressing with increase lower limb swelling. Over the last few days she has been bed bound and not able to walk from shortness of breath. He states that she has severe anxiety from doctors and she is worried that she will be diagnosed with dementia and his husband has noticed that she has been very forgetful. She had no history of fever, chills, cough or sputum. She was diagnosed with HTN a while ago but refused to use her medicines.  In the ED she was found to have bilateral infiltrates/pulm edema and severe hyperglycemia. Patient is being put on BIPAP. She is lethargic but is easily arousable. Not participating her history .    Per husband, patient has not seen a physician for the last 7yrs, she does not take any meds at home for many yrs. Husband has noticed patient started to have memory issues, but report patient still drives 4weeks prior to coming to the hospital.   Significant Events: 9/7 admitted to ICU on bipap 9/8 TTE - EF 25-30% - grade 1 DD - mod AoV regurg - mild MV regurg  9/10 [facility] assumed care   HPI/Recap of past 24 hours:  Patient is demented only oriented to person Husband at bedside  Assessment/Plan: Principal Problem:   Acute diastolic (congestive) heart failure (HCC) Active Problems:   Obesity, unspecified   Essential hypertension   Noncompliance   CHF (congestive heart failure) ([place])   Acute respiratory failure with hypoxia and hypercapnia ([place])   Diabetes mellitus ([place])   Acute hypoxic rep failure w/ pulmonary edema /chf ef 25-30% -Has required intermittent BIPAP support - wean off O2 as able - appears to be stabilizing quickly w/ diuresis  -No WMA on TTE suggesting probable HTN etiology -  she would not be a candidate for aggressive tx given her rapidly progressing dementia,  -husband is not interested in aggressive measures -cardiology was not consulted.  -continue diuresis as long as bp and renal function allows - currently on lasix, coreg, imdur, lisinopril,  titrate meds to find optimal care - wean oxygen, remain edematous, dry weight not yet established  Bilateral lower extremity edema:  Venous doppler ordered to r/o DVT Continue lasix  AKI on CKDII: ua no infection. Cr 1.22 on admission, cr 0.9 today. Renal dosing meds  Thrombocytopenia:  plt nadir at 81 improving   New diagnosis of diabetes: a1c 7.8 Glucose 428 on admission Started on On ssi here Am fasting blood sugar range from 68-195 last three days  Confusion: progressive dementia Husband report patient still drive 4weeks ago, but he noticed signs of confusion and progressive cognitive decline for the last few years CT head /rpr/ammonia ordered  Breast mass: left breast, nontender, no erythema, about 6cm in diameter, I have discussed with husband, husband is not interested in aggressive intervention. He is not interested on biopsy.   FTT, snf placement.  Code Status: DNR  Family Communication: patient and husband at bedside  Disposition Plan: SNF ( husband is agreeable), social worker is aware   Consultants:  Critical care admission, transferred to hospitalist team one  On 9/10.   Procedures:  bipap  Antibiotics:  none   Objective: BP 120/61 (BP Location: Right Arm)  Pulse 70   Temp 98.6 F (37 C) (Axillary)   Resp 20   Ht 5' (1.524 m)   Wt 77.6 kg (171 lb 1.6 oz)   SpO2 97%   BMI 33.42 kg/m   Intake/Output Summary (Last 24 hours) at 04/29/17 0905 Last data filed at 04/28/17 2122  Gross per 24 hour  Intake              770 ml  Output                0 ml  Net              770 ml   Filed Weights   04/26/17 0402 04/27/17 0403 04/29/17 0611  Weight: 83 kg (182 lb 15.7  oz) 76.7 kg (169 lb 1.5 oz) 77.6 kg (171 lb 1.6 oz)    Exam: Patient is examined daily including today on 04/29/2017, exams remain the same as of yesterday except that has changed    General:  Chronically ill, demented, only oriented to person, calm and cooperative  Cardiovascular: RRR  Respiratory: CTABL  Abdomen: Soft/ND/NT, positive BS  Musculoskeletal: bilateral pitting edema, generalized weakness, not able to life legs against gravity   Neuro: alert, oriented to person only   Data Reviewed: Basic Metabolic Panel:  Recent Labs Lab 04/26/17 1430 04/27/17 0337 04/27/17 1517 04/28/17 0218 04/29/17 0533  NA 145 145 142 140 138  K 3.5 2.9* 3.4* 3.6 3.4*  CL 103 101 98* 98* 96*  CO2 32 32 34* 32 35*  GLUCOSE 121* 89 195* 68 149*  BUN 23* 20 18 22* 22*  CREATININE 0.99 0.88 0.95 0.88 0.91  CALCIUM 8.5* 7.9* 8.2* 8.0* 7.8*  MG  --   --   --  1.8  --   PHOS  --   --   --  3.5  --    Liver Function Tests:  Recent Labs Lab 04/25/17 1310 04/27/17 0337  AST 33 30  ALT 42 34  ALKPHOS 63 46  BILITOT 1.8* 1.3*  PROT 6.1* 4.7*  ALBUMIN 3.9 2.8*   No results for input(s): LIPASE, AMYLASE in the last 168 hours. No results for input(s): AMMONIA in the last 168 hours. CBC:  Recent Labs Lab 04/25/17 2306 04/26/17 0455 04/27/17 0337 04/28/17 0218 04/29/17 0533  WBC 9.9 8.6 7.6 9.3 6.3  HGB 16.0* 15.0 14.6 15.5* 14.3  HCT 48.8* 47.0* 45.8 47.2* 44.1  MCV 95.1 96.1 96.4 95.4 96.5  PLT 86* 81* 85* 114* 102*   Cardiac Enzymes:    Recent Labs Lab 04/26/17 0943  TROPONINI 0.37*   BNP (last 3 results)  Recent Labs  04/25/17 1812 04/28/17 0219  BNP >4,500.0* 1,411.7*    ProBNP (last 3 results) No results for input(s): PROBNP in the last 8760 hours.  CBG:  Recent Labs Lab 04/28/17 1214 04/28/17 1715 04/28/17 2102 04/28/17 2356 04/29/17 0740  GLUCAP 124* 197* 316* 179* 140*    Recent Results (from the past 240 hour(s))  Culture, blood (routine x  2)     Status: Abnormal   Collection Time: 04/25/17  5:50 PM  Result Value Ref Range Status   Specimen Description BLOOD RIGHT WRIST  Final   Special Requests   Final    BOTTLES DRAWN AEROBIC AND ANAEROBIC Blood Culture adequate volume   Culture  Setup Time   Final    GRAM POSITIVE COCCI IN CLUSTERS AEROBIC BOTTLE ONLY CRITICAL RESULT CALLED TO, READ BACK BY AND VERIFIED  WITH: R RUMBARGER,PHARMD AT 9562 04/26/17 BY L BENFIELD    Culture (A)  Final    STAPHYLOCOCCUS SPECIES (COAGULASE NEGATIVE) THE SIGNIFICANCE OF ISOLATING THIS ORGANISM FROM A SINGLE SET OF BLOOD CULTURES WHEN MULTIPLE SETS ARE DRAWN IS UNCERTAIN. PLEASE NOTIFY THE MICROBIOLOGY DEPARTMENT WITHIN ONE WEEK IF SPECIATION AND SENSITIVITIES ARE REQUIRED.    Report Status 04/28/2017 FINAL  Final  Blood Culture ID Panel (Reflexed)     Status: Abnormal   Collection Time: 04/25/17  5:50 PM  Result Value Ref Range Status   Enterococcus species NOT DETECTED NOT DETECTED Final   Vancomycin resistance NOT DETECTED NOT DETECTED Final   Listeria monocytogenes NOT DETECTED NOT DETECTED Final   Staphylococcus species DETECTED (A) NOT DETECTED Final    Comment: CRITICAL RESULT CALLED TO, READ BACK BY AND VERIFIED WITH: R RUMBARGER,PHARMD AT 1155 04/26/17 BY L BENFIELD    Staphylococcus aureus NOT DETECTED NOT DETECTED Final   Methicillin resistance NOT DETECTED NOT DETECTED Final   Streptococcus species NOT DETECTED NOT DETECTED Final   Streptococcus agalactiae NOT DETECTED NOT DETECTED Final   Streptococcus pneumoniae NOT DETECTED NOT DETECTED Final   Streptococcus pyogenes NOT DETECTED NOT DETECTED Final   Acinetobacter baumannii NOT DETECTED NOT DETECTED Final   Enterobacteriaceae species NOT DETECTED NOT DETECTED Final   Enterobacter cloacae complex NOT DETECTED NOT DETECTED Final   Escherichia coli NOT DETECTED NOT DETECTED Final   Klebsiella oxytoca NOT DETECTED NOT DETECTED Final   Klebsiella pneumoniae NOT DETECTED NOT  DETECTED Final   Proteus species NOT DETECTED NOT DETECTED Final   Serratia marcescens NOT DETECTED NOT DETECTED Final   Carbapenem resistance NOT DETECTED NOT DETECTED Final   Haemophilus influenzae NOT DETECTED NOT DETECTED Final   Neisseria meningitidis NOT DETECTED NOT DETECTED Final   Pseudomonas aeruginosa NOT DETECTED NOT DETECTED Final   Candida albicans NOT DETECTED NOT DETECTED Final   Candida glabrata NOT DETECTED NOT DETECTED Final   Candida krusei NOT DETECTED NOT DETECTED Final   Candida parapsilosis NOT DETECTED NOT DETECTED Final   Candida tropicalis NOT DETECTED NOT DETECTED Final  Culture, blood (routine x 2)     Status: None (Preliminary result)   Collection Time: 04/25/17  6:12 PM  Result Value Ref Range Status   Specimen Description BLOOD LEFT ANTECUBITAL  Final   Special Requests   Final    BOTTLES DRAWN AEROBIC AND ANAEROBIC Blood Culture adequate volume   Culture NO GROWTH 3 DAYS  Final   Report Status PENDING  Incomplete  MRSA PCR Screening     Status: None   Collection Time: 04/25/17 10:52 PM  Result Value Ref Range Status   MRSA by PCR NEGATIVE NEGATIVE Final    Comment:        The GeneXpert MRSA Assay (FDA approved for NASAL specimens only), is one component of a comprehensive MRSA colonization surveillance program. It is not intended to diagnose MRSA infection nor to guide or monitor treatment for MRSA infections.      Studies: No results found.  Scheduled Meds: . aspirin EC  81 mg Oral Daily  . chlorhexidine  15 mL Mouth Rinse BID  . enoxaparin (LOVENOX) injection  40 mg Subcutaneous Q24H  . furosemide  40 mg Intravenous Q12H  . insulin aspart  0-5 Units Subcutaneous QHS  . insulin aspart  0-9 Units Subcutaneous TID WC  . isosorbide mononitrate  30 mg Oral Daily  . lisinopril  2.5 mg Oral Daily  . mouth rinse  15  mL Mouth Rinse q12n4p  . metoprolol tartrate  12.5 mg Oral BID  . pneumococcal 23 valent vaccine  0.5 mL Intramuscular  Tomorrow-1000    Continuous Infusions:   Time spent: 35 mins I have personally reviewed and interpreted on  04/29/2017  daily labs, tele strips, imagings as discussed above under data review session and assessment and plans.  I reviewed all nursing notes, pharmacy notes, consultant notes,  vitals, pertinent old records  I have discussed plan of care as described above with RN , patient and family on 04/29/2017   Luetta Piazza MD, PhD  [facility] Pager [phone]. If 7PM-7AM, please contact night-coverage at [email or web address], password [facility] 04/29/2017, 9:05 AM  LOS: 4 days

## 2017-04-29 NOTE — Care Management Important Message (Signed)
Important Message  Patient Details  Name: Charlene Rubio MRN: [id number] Date of Birth: Dec 09, 1945   Medicare Important Message Given:  Yes    [name] [name] 04/29/2017, 11:08 AM

## 2017-04-29 NOTE — Clinical Social Work Placement (Signed)
   CLINICAL SOCIAL WORK PLACEMENT  NOTE  Date:  04/29/2017  Patient Details  Name: Charlene Rubio MRN: [id number] Date of Birth: 1946/01/01  Clinical Social Work is seeking post-discharge placement for this patient at the [place] level of care (*CSW will initial, date and re-position this form in  chart as items are completed):  Yes   Patient/family provided with [facility] Work Department's list of facilities offering this level of care within the geographic area requested by the patient (or if unable, by the patient's family).  Yes   Patient/family informed of their freedom to choose among providers that offer the needed level of care, that participate in Medicare, Medicaid or managed care program needed by the patient, have an available bed and are willing to accept the patient.  Yes   Patient/family informed of [facility]'s ownership interest in [facility] and [facility], as well as of the fact that they are under no obligation to receive care at these facilities.  PASRR submitted to EDS on 04/27/17     PASRR number received on 04/27/17     Existing PASRR number confirmed on       FL2 transmitted to all facilities in geographic area requested by pt/family on 04/27/17     FL2 transmitted to all facilities within larger geographic area on       Patient informed that his/her managed care company has contracts with or will negotiate with certain facilities, including the following:            Patient/family informed of bed offers received.  Patient chooses bed at       Physician recommends and patient chooses bed at      Patient to be transferred to   on  .  Patient to be transferred to facility by       Patient family notified on   of transfer.  Name of family member notified:        PHYSICIAN Please sign DNR     Additional Comment:    _______________________________________________ Candie Chroman, LCSW 04/29/2017, 12:42  PM

## 2017-04-29 NOTE — Progress Notes (Signed)
Patient removing equipment, green safety mittens reapplied.

## 2017-04-30 ENCOUNTER — Inpatient Hospital Stay (HOSPITAL_COMMUNITY): Payer: Medicare Other

## 2017-04-30 DIAGNOSIS — F0390 Unspecified dementia without behavioral disturbance: Secondary | ICD-10-CM

## 2017-04-30 DIAGNOSIS — Z9119 Patient's noncompliance with other medical treatment and regimen: Secondary | ICD-10-CM

## 2017-04-30 DIAGNOSIS — I509 Heart failure, unspecified: Secondary | ICD-10-CM

## 2017-04-30 DIAGNOSIS — R609 Edema, unspecified: Secondary | ICD-10-CM

## 2017-04-30 DIAGNOSIS — I10 Essential (primary) hypertension: Secondary | ICD-10-CM

## 2017-04-30 HISTORY — DX: Unspecified dementia, unspecified severity, without behavioral disturbance, psychotic disturbance, mood disturbance, and anxiety: F03.90

## 2017-04-30 LAB — CBC
HCT: 49.8 % — ABNORMAL HIGH (ref 36.0–46.0)
HEMOGLOBIN: 16.7 g/dL — AB (ref 12.0–15.0)
MCH: 32.2 pg (ref 26.0–34.0)
MCHC: 33.5 g/dL (ref 30.0–36.0)
MCV: 96.1 fL (ref 78.0–100.0)
Platelets: 98 10*3/uL — ABNORMAL LOW (ref 150–400)
RBC: 5.18 MIL/uL — AB (ref 3.87–5.11)
RDW: 15.7 % — ABNORMAL HIGH (ref 11.5–15.5)
WBC: 9.1 10*3/uL (ref 4.0–10.5)

## 2017-04-30 LAB — HEPATIC FUNCTION PANEL
ALT: 28 U/L (ref 14–54)
AST: 29 U/L (ref 15–41)
Albumin: 3 g/dL — ABNORMAL LOW (ref 3.5–5.0)
Alkaline Phosphatase: 45 U/L (ref 38–126)
Bilirubin, Direct: 0.3 mg/dL (ref 0.1–0.5)
Indirect Bilirubin: 1 mg/dL — ABNORMAL HIGH (ref 0.3–0.9)
TOTAL PROTEIN: 5.1 g/dL — AB (ref 6.5–8.1)
Total Bilirubin: 1.3 mg/dL — ABNORMAL HIGH (ref 0.3–1.2)

## 2017-04-30 LAB — GLUCOSE, CAPILLARY
GLUCOSE-CAPILLARY: 140 mg/dL — AB (ref 65–99)
GLUCOSE-CAPILLARY: 191 mg/dL — AB (ref 65–99)
Glucose-Capillary: 147 mg/dL — ABNORMAL HIGH (ref 65–99)
Glucose-Capillary: 221 mg/dL — ABNORMAL HIGH (ref 65–99)

## 2017-04-30 LAB — AMMONIA: AMMONIA: 36 umol/L — AB (ref 9–35)

## 2017-04-30 LAB — BASIC METABOLIC PANEL
Anion gap: 9 (ref 5–15)
BUN: 21 mg/dL — ABNORMAL HIGH (ref 6–20)
CHLORIDE: 96 mmol/L — AB (ref 101–111)
CO2: 33 mmol/L — ABNORMAL HIGH (ref 22–32)
CREATININE: 0.89 mg/dL (ref 0.44–1.00)
Calcium: 8.2 mg/dL — ABNORMAL LOW (ref 8.9–10.3)
GFR calc non Af Amer: >60 mL/min (ref >60)
Glucose, Bld: 133 mg/dL — ABNORMAL HIGH (ref 65–99)
Potassium: 3.9 mmol/L (ref 3.5–5.1)
SODIUM: 138 mmol/L (ref 135–145)

## 2017-04-30 LAB — CULTURE, BLOOD (ROUTINE X 2)
CULTURE: NO GROWTH
SPECIAL REQUESTS: ADEQUATE

## 2017-04-30 LAB — MAGNESIUM: MAGNESIUM: 2 mg/dL (ref 1.7–2.4)

## 2017-04-30 LAB — RPR: RPR Ser Ql: NONREACTIVE

## 2017-04-30 LAB — VITAMIN B12: VITAMIN B 12: 338 pg/mL (ref 180–914)

## 2017-04-30 LAB — FOLATE: FOLATE: 12 ng/mL (ref >5.9)

## 2017-04-30 MED ORDER — FUROSEMIDE 40 MG PO TABS
40.0000 mg | ORAL_TABLET | Freq: Every day | ORAL | Status: DC
  Administered 2017-05-01: 40 mg via ORAL
  Filled 2017-04-30: qty 1

## 2017-04-30 MED ORDER — APIXABAN 5 MG PO TABS: 5.0000 mg | ORAL_TABLET | Freq: Two times a day (BID) | ORAL | Status: DC

## 2017-04-30 MED ORDER — APIXABAN 5 MG PO TABS
10.0000 mg | ORAL_TABLET | Freq: Two times a day (BID) | ORAL | Status: DC
  Administered 2017-04-30 – 2017-05-01 (×3): 10 mg via ORAL
  Filled 2017-04-30 (×3): qty 2

## 2017-04-30 MED ORDER — PREMIER PROTEIN SHAKE
11.0000 [oz_av] | ORAL | Status: DC
  Administered 2017-04-30: 11 [oz_av] via ORAL
  Filled 2017-04-30 (×3): qty 325.31

## 2017-04-30 MED ORDER — METFORMIN HCL ER 500 MG PO TB24
500.0000 mg | ORAL_TABLET | Freq: Two times a day (BID) | ORAL | Status: DC
  Administered 2017-04-30 – 2017-05-01 (×2): 500 mg via ORAL
  Filled 2017-04-30 (×2): qty 1

## 2017-04-30 NOTE — Progress Notes (Signed)
PROGRESS NOTE  ENDYA AUSTIN OMV:[id number] DOB: 1946-05-09 DOA: 04/25/2017 PCP: [name], DO  Brief Summary:  71 yr old lady with suspected dementia, HTN not treated who is coming in with shortness of breath. As per husband she has been short of breath for 3 weeks progressing with increase lower limb swelling. Over the last few days she has been bed bound and not able to walk from shortness of breath. He states that she has severe anxiety from doctors and she is worried that she will be diagnosed with dementia and his husband has noticed that she has been very forgetful. She had no history of fever, chills, cough or sputum. She was diagnosed with HTN a while ago but refused to use her medicines.  In the ED she was found to have bilateral infiltrates/pulm edema and severe hyperglycemia. Patient is being put on BIPAP. She is lethargic but is easily arousable. Not participating her history .    Per husband, patient has not seen a physician for the last 38yrs, she does not take any meds at home for many yrs. Husband has noticed patient started to have memory issues, but report patient still drives 4weeks prior to coming to the hospital.   Significant Events: 9/7 admitted to ICU on bipap 9/8 TTE - EF 25-30% - grade 1 DD - mod AoV regurg - mild MV regurg  9/10 TRH assumed care   HPI/Recap of past 24 hours:  Pt without complaints today.    Assessment/Plan: Principal Problem:   Acute diastolic (congestive) heart failure (HCC) Active Problems:   Obesity, unspecified   Essential hypertension   Noncompliance   CHF (congestive heart failure) (HCC)   Acute respiratory failure with hypoxia and hypercapnia (HCC)   Diabetes mellitus ([place])  Acute hypoxic rep failure w/ pulmonary edema /chf ef 25-30% -Has required intermittent BIPAP support - wean off O2 as able - appears to be stabilizing quickly w/ diuresis  -No WMA on TTE suggesting probable HTN etiology - she would not be a candidate for  aggressive tx given her rapidly progressing dementia  -husband is not interested in aggressive measures -outpatient cardiology follow up  -continue diuresis as long as bp and renal function allows - currently on lasix, coreg, imdur, lisinopril,  titrate meds to find optimal care - wean oxygen - start oral lasix tomorrow.   Bilateral lower extremity edema:  Venous doppler ordered to r/o DVT Continue lasix  AKI on CKDII: ua no infection. Cr 1.22 on admission, cr trending down.  Renal dosing meds  Thrombocytopenia:  plt nadir at 81 Improving  Chronic DVT LLE - eliquis per pharmacy, TED Hoses, anticoagulate for at least 3 months.   New diagnosis of diabetes: a1c 7.8 Glucose 428 on admission Started on On ssi here Am fasting blood sugar range from 68-195 last three days Start oral glucophage XR with supper  Confusion: progressive dementia Husband report patient still drive 4weeks ago, but he noticed signs of confusion and progressive cognitive decline for the last few years CT head with chronic microvascular changes.   Left Breast mass: left breast, nontender, no erythema, about 6 cm in diameter, I have discussed with husband, husband is not interested in aggressive intervention. He is not interested on biopsy. Follow up with PCP after discharge.    FTT, snf placement.  Code Status: DNR  Family Communication: patient and husband at bedside  Disposition Plan: SNF ( husband is agreeable), social worker is aware   Consultants:  Critical care  admission, transferred to hospitalist team one  On 9/10.   Procedures:  bipap  Antibiotics:  none   Objective: BP 129/67 (BP Location: Right Arm)   Pulse 72   Temp 98.1 F (36.7 C) (Oral)   Resp 18   Ht 5' (1.524 m)   Wt 73.3 kg (161 lb 9.6 oz) Comment: bed; rn states ok to weigh pt in bed  SpO2 96%   BMI 31.56 kg/m   Intake/Output Summary (Last 24 hours) at 04/30/17 1120 Last data filed at 04/30/17 0940  Gross per 24  hour  Intake              720 ml  Output             2350 ml  Net            -1630 ml   Filed Weights   04/27/17 0403 04/29/17 0611 04/30/17 0629  Weight: 76.7 kg (169 lb 1.5 oz) 77.6 kg (171 lb 1.6 oz) 73.3 kg (161 lb 9.6 oz)    Exam: Patient is examined daily including today on 04/30/2017, exams remain the same as of yesterday except that has changed    General:  Chronically ill, demented, only oriented to person, calm and cooperative  Cardiovascular: RRR  Respiratory: CTABL  Abdomen: Soft/ND/NT, positive BS  Musculoskeletal: bilateral pitting edema, generalized weakness, not able to life legs against gravity   Neuro: alert, oriented to person only   Data Reviewed: Basic Metabolic Panel:  Recent Labs Lab 04/27/17 0337 04/27/17 1517 04/28/17 0218 04/29/17 0533 04/30/17 0525  NA 145 142 140 138 138  K 2.9* 3.4* 3.6 3.4* 3.9  CL 101 98* 98* 96* 96*  CO2 32 34* 32 35* 33*  GLUCOSE 89 195* 68 149* 133*  BUN 20 18 22* 22* 21*  CREATININE 0.88 0.95 0.88 0.91 0.89  CALCIUM 7.9* 8.2* 8.0* 7.8* 8.2*  MG  --   --  1.8  --  2.0  PHOS  --   --  3.5  --   --    Liver Function Tests:  Recent Labs Lab 04/25/17 1310 04/27/17 0337 04/30/17 0525  AST 33 30 29  ALT 42 34 28  ALKPHOS 63 46 45  BILITOT 1.8* 1.3* 1.3*  PROT 6.1* 4.7* 5.1*  ALBUMIN 3.9 2.8* 3.0*   No results for input(s): LIPASE, AMYLASE in the last 168 hours.  Recent Labs Lab 04/30/17 0525  AMMONIA 36*   CBC:  Recent Labs Lab 04/26/17 0455 04/27/17 0337 04/28/17 0218 04/29/17 0533 04/30/17 0525  WBC 8.6 7.6 9.3 6.3 9.1  HGB 15.0 14.6 15.5* 14.3 16.7*  HCT 47.0* 45.8 47.2* 44.1 49.8*  MCV 96.1 96.4 95.4 96.5 96.1  PLT 81* 85* 114* 102* 98*   Cardiac Enzymes:    Recent Labs Lab 04/26/17 0943  TROPONINI 0.37*   BNP (last 3 results)  Recent Labs  04/25/17 1812 04/28/17 0219  BNP >4,500.0* 1,411.7*    ProBNP (last 3 results) No results for input(s): PROBNP in the last 8760  hours.  CBG:  Recent Labs Lab 04/29/17 0740 04/29/17 1143 04/29/17 1709 04/29/17 2114 04/30/17 0816  GLUCAP 140* 118* 163* 214* 140*    Recent Results (from the past 240 hour(s))  Culture, blood (routine x 2)     Status: Abnormal   Collection Time: 04/25/17  5:50 PM  Result Value Ref Range Status   Specimen Description BLOOD RIGHT WRIST  Final   Special Requests   Final  BOTTLES DRAWN AEROBIC AND ANAEROBIC Blood Culture adequate volume   Culture  Setup Time   Final    GRAM POSITIVE COCCI IN CLUSTERS AEROBIC BOTTLE ONLY CRITICAL RESULT CALLED TO, READ BACK BY AND VERIFIED WITH: R RUMBARGER,PHARMD AT 1224 04/26/17 BY L BENFIELD    Culture (A)  Final    STAPHYLOCOCCUS SPECIES (COAGULASE NEGATIVE) THE SIGNIFICANCE OF ISOLATING THIS ORGANISM FROM A SINGLE SET OF BLOOD CULTURES WHEN MULTIPLE SETS ARE DRAWN IS UNCERTAIN. PLEASE NOTIFY THE MICROBIOLOGY DEPARTMENT WITHIN ONE WEEK IF SPECIATION AND SENSITIVITIES ARE REQUIRED.    Report Status 04/28/2017 FINAL  Final  Blood Culture ID Panel (Reflexed)     Status: Abnormal   Collection Time: 04/25/17  5:50 PM  Result Value Ref Range Status   Enterococcus species NOT DETECTED NOT DETECTED Final   Vancomycin resistance NOT DETECTED NOT DETECTED Final   Listeria monocytogenes NOT DETECTED NOT DETECTED Final   Staphylococcus species DETECTED (A) NOT DETECTED Final    Comment: CRITICAL RESULT CALLED TO, READ BACK BY AND VERIFIED WITH: R RUMBARGER,PHARMD AT 1155 04/26/17 BY L BENFIELD    Staphylococcus aureus NOT DETECTED NOT DETECTED Final   Methicillin resistance NOT DETECTED NOT DETECTED Final   Streptococcus species NOT DETECTED NOT DETECTED Final   Streptococcus agalactiae NOT DETECTED NOT DETECTED Final   Streptococcus pneumoniae NOT DETECTED NOT DETECTED Final   Streptococcus pyogenes NOT DETECTED NOT DETECTED Final   Acinetobacter baumannii NOT DETECTED NOT DETECTED Final   Enterobacteriaceae species NOT DETECTED NOT DETECTED  Final   Enterobacter cloacae complex NOT DETECTED NOT DETECTED Final   Escherichia coli NOT DETECTED NOT DETECTED Final   Klebsiella oxytoca NOT DETECTED NOT DETECTED Final   Klebsiella pneumoniae NOT DETECTED NOT DETECTED Final   Proteus species NOT DETECTED NOT DETECTED Final   Serratia marcescens NOT DETECTED NOT DETECTED Final   Carbapenem resistance NOT DETECTED NOT DETECTED Final   Haemophilus influenzae NOT DETECTED NOT DETECTED Final   Neisseria meningitidis NOT DETECTED NOT DETECTED Final   Pseudomonas aeruginosa NOT DETECTED NOT DETECTED Final   Candida albicans NOT DETECTED NOT DETECTED Final   Candida glabrata NOT DETECTED NOT DETECTED Final   Candida krusei NOT DETECTED NOT DETECTED Final   Candida parapsilosis NOT DETECTED NOT DETECTED Final   Candida tropicalis NOT DETECTED NOT DETECTED Final  Culture, blood (routine x 2)     Status: None (Preliminary result)   Collection Time: 04/25/17  6:12 PM  Result Value Ref Range Status   Specimen Description BLOOD LEFT ANTECUBITAL  Final   Special Requests   Final    BOTTLES DRAWN AEROBIC AND ANAEROBIC Blood Culture adequate volume   Culture NO GROWTH 4 DAYS  Final   Report Status PENDING  Incomplete  MRSA PCR Screening     Status: None   Collection Time: 04/25/17 10:52 PM  Result Value Ref Range Status   MRSA by PCR NEGATIVE NEGATIVE Final    Comment:        The GeneXpert MRSA Assay (FDA approved for NASAL specimens only), is one component of a comprehensive MRSA colonization surveillance program. It is not intended to diagnose MRSA infection nor to guide or monitor treatment for MRSA infections.      Studies: Ct Head Wo Contrast  Result Date: 04/29/2017 CLINICAL DATA:  71 y/o  F; increase forgetfulness and lethargy. EXAM: CT HEAD WITHOUT CONTRAST TECHNIQUE: Contiguous axial images were obtained from the base of the skull through the vertex without intravenous contrast. COMPARISON:  None.  FINDINGS: Brain: No evidence  of acute infarction, hemorrhage, hydrocephalus, extra-axial collection or mass lesion/mass effect. Moderate chronic microvascular ischemic changes of white matter parenchymal volume loss of the brain. Small chronic lacunar infarcts within the cerebellar hemispheres bilaterally, the left thalamus, and the right caudate head. Vascular: Extensive calcific atherosclerosis of carotid siphons. Skull: Normal. Negative for fracture or focal lesion. Sinuses/Orbits: No acute finding. Other: None. IMPRESSION: 1. No acute intracranial abnormality. 2. Moderate chronic microvascular ischemic changes and parenchymal volume loss of the brain. Small chronic lacunar infarcts in cerebellum and basal ganglia. Electronically Signed   By: Kristine Garbe M.D.   On: 04/29/2017 22:45   Scheduled Meds: . aspirin EC  81 mg Oral Daily  . carvedilol  3.125 mg Oral BID WC  . chlorhexidine  15 mL Mouth Rinse BID  . enoxaparin (LOVENOX) injection  40 mg Subcutaneous Q24H  . furosemide  40 mg Intravenous Q12H  . furosemide  40 mg Oral Daily  . insulin aspart  0-5 Units Subcutaneous QHS  . insulin aspart  0-9 Units Subcutaneous TID WC  . isosorbide mononitrate  15 mg Oral Daily  . lisinopril  2.5 mg Oral Daily  . mouth rinse  15 mL Mouth Rinse q12n4p   Continuous Infusions:  Time spent: 35 mins I have personally reviewed and interpreted on  04/30/2017  daily labs, tele strips, imagings as discussed above under data review session and assessment and plans.  I reviewed all nursing notes, pharmacy notes, consultant notes,  vitals, pertinent old records  I have discussed plan of care as described above with RN , patient and family on 04/30/2017  Irwin Brakeman MD  [facility] Pager [phone]. If 7PM-7AM, please contact night-coverage at [email or web address], password [facility] 04/30/2017, 11:20 AM  LOS: 5 days

## 2017-04-30 NOTE — Discharge Instructions (Addendum)
Information on my medicine - ELIQUIS (apixaban)  Why was Eliquis prescribed for you? Eliquis was prescribed to treat blood clots that may have been found in the veins of your legs (deep vein thrombosis) or in your lungs (pulmonary embolism) and to reduce the risk of them occurring again.  What do You need to know about Eliquis ? The starting dose is 10 mg (two 5 mg tablets) taken TWICE daily for the FIRST SEVEN (7) DAYS, then on 9/19  the dose is reduced to ONE 5 mg tablet taken TWICE daily.  Eliquis may be taken with or without food.   Try to take the dose about the same time in the morning and in the evening. If you have difficulty swallowing the tablet whole please discuss with your pharmacist how to take the medication safely.  Take Eliquis exactly as prescribed and DO NOT stop taking Eliquis without talking to the doctor who prescribed the medication.  Stopping may increase your risk of developing a new blood clot.  Refill your prescription before you run out.  After discharge, you should have regular check-up appointments with your healthcare provider that is prescribing your Eliquis.    What do you do if you miss a dose? If a dose of ELIQUIS is not taken at the scheduled time, take it as soon as possible on the same day and twice-daily administration should be resumed. The dose should not be doubled to make up for a missed dose.  Important Safety Information A possible side effect of Eliquis is bleeding. You should call your healthcare provider right away if you experience any of the following: ? Bleeding from an injury or your nose that does not stop. ? Unusual colored urine (red or dark brown) or unusual colored stools (red or black). ? Unusual bruising for unknown reasons. ? A serious fall or if you hit your head (even if there is no bleeding).  Some medicines may interact with Eliquis and might increase your risk of bleeding or clotting while on Eliquis. To help avoid  this, consult your healthcare provider or pharmacist prior to using any new prescription or non-prescription medications, including herbals, vitamins, non-steroidal anti-inflammatory drugs (NSAIDs) and supplements.  This website has more information on Eliquis (apixaban): http://www.eliquis.com/eliquis/home

## 2017-04-30 NOTE — Progress Notes (Signed)
Short period of SVT. Non sustained, patient sleep and easily aroused.

## 2017-04-30 NOTE — Clinical Social Work Note (Addendum)
Husband not at bedside. Unit staff will notify CSW when he arrives. Will provide bed offers and discuss SNF decision at that time.  Dayton Scrape, [place] [phone]  10:31 am Bed offers provided to patient and her husband. He has chosen [facility] and prefers to transport her there in his Lucianne Lei. CSW left voicemail for admissions coordinator. Awaiting call back.  Dayton Scrape, [place] [phone]  10:57 am [facility] has a bed today depending on how early discharge orders and summary are put in. CSW paged MD to notify.  Dayton Scrape, CSW [phone]  11:32 am Per MD, patient has DVT and discharge will be postponed until tomorrow. [facility] admissions coordinator notified and stated they should still be able to take her tomorrow.  Dayton Scrape, [place]

## 2017-04-30 NOTE — Progress Notes (Signed)
ANTICOAGULATION CONSULT NOTE - Initial Consult  Pharmacy Consult for Eliquis Indication: DVT  No Known Allergies  Patient Measurements: Height: 5' (152.4 cm) Weight: 161 lb 9.6 oz (73.3 kg) (bed; rn states ok to weigh pt in bed) IBW/kg (Calculated) : 45.5  Assessment: 71 yo F admitted with SOB and lower extremity edema. Dopplers on 9/12 found possible chronic DVT. Starting Eliquis for treatment. Hgb ok, plts low at 98.  Goal of Therapy:  Monitor platelets by anticoagulation protocol: Yes   Plan:  Stop Lovenox Start Eliquis 10mg  PO BID x 7 days, then switch to 5mg  PO BID Monitor CBC, s/s of bleed  Elenor Quinones, PharmD, [facility] Clinical Pharmacist Pager [phone] 04/30/2017 11:28 AM

## 2017-04-30 NOTE — Plan of Care (Signed)
Problem: Education: Goal: Knowledge of [facility] Education information/materials will improve Outcome: Not Met (add Reason) Patient confused  Problem: Health Behavior/Discharge Planning: Goal: Ability to manage health-related needs will improve Outcome: Progressing Family involved with managing patient's healthcare.

## 2017-04-30 NOTE — Progress Notes (Signed)
*  PRELIMINARY RESULTS* Vascular Ultrasound Bilateral lower extremity venous duplex has been completed.  Preliminary findings: The visualized veins of the right upper extremity appear negative for deep vein thrombosis.  Findings are suggestive of chronic deep vein thrombosis in the left distal femoral, popliteal and calf veins of the left lower extremity.  Somewhat difficult exam due to patient positioning and cooperation.  Preliminary results called to patients nurse, Melva, @ 11:05. Charlene Rubio Jasten Guyette 04/30/2017, 11:01 AM

## 2017-04-30 NOTE — Progress Notes (Signed)
Nutrition Follow-up  DOCUMENTATION CODES:   Obesity unspecified  INTERVENTION:   Provide Premier Protein Q24 hours, each supplement provides 160kcal and 30g protein.   Diabetes education provided to husband  NUTRITION DIAGNOSIS:   Inadequate oral intake related to acute illness as evidenced by NPO status.  Pt upgraded to carb modified/heart healthy diet  GOAL:   Patient will meet greater than or equal to 90% of their needs  Meeting  MONITOR:   Diet advancement, PO intake, Labs, Weight trends  REASON FOR ASSESSMENT:   Consult Diet education  ASSESSMENT:   71 yo female admitted with progressive SOB over the past 3 weeks with increased LE swelling with acute respiratory failure, CHF requiring BiPap; AKI, AMS. Pt with hx of suspected dementia, HTN, borderline DM  Spoke with husband at bedside who reports pt's appetite back to baseline. Meal completions charted as 100% average for last 8 meals. Wt noted to be down 8 lbs since admission most likely fluid related.   RD provided further diet education to husband and went over "Carbohydrate Counting for People with Diabetes" handout from the Academy of Nutrition and Dietetics. Discussed different food groups and their effects on blood sugar, emphasizing carbohydrate-containing foods. Provided list of carbohydrates and recommended serving sizes of common foods.Discussed importance of controlled and consistent carbohydrate intake throughout the day. Provided examples of ways to balance meals/snacks and encouraged intake of high-fiber, whole grain complex carbohydrates. Teach back method used.  Medications reviewed and include: lasix, SSI Labs reviewed: Cl 96 (L) CBG 133-149 BUN 21 (H) Albumin 3.0 (L)   Diet Order:  Diet heart healthy/carb modified Room service appropriate? Yes; Fluid consistency: Thin  Skin:  Reviewed, no issues  Last BM:  04/28/17  Height:   Ht Readings from Last 1 Encounters:  04/25/17 5' (1.524 m)     Weight:   Wt Readings from Last 1 Encounters:  04/30/17 161 lb 9.6 oz (73.3 kg)    Ideal Body Weight:  45.6 kg  BMI:  Body mass index is 31.56 kg/m.  Estimated Nutritional Needs:   Kcal:  5284-1324 kcals  Protein:  83-93 g  Fluid:  >/= 1.5 L  EDUCATION NEEDS:   Education needs addressed  [place], LDN Clinical Nutrition Pager # [phone]

## 2017-05-01 DIAGNOSIS — E118 Type 2 diabetes mellitus with unspecified complications: Secondary | ICD-10-CM | POA: Diagnosis not present

## 2017-05-01 DIAGNOSIS — I82502 Chronic embolism and thrombosis of unspecified deep veins of left lower extremity: Secondary | ICD-10-CM | POA: Diagnosis not present

## 2017-05-01 DIAGNOSIS — I129 Hypertensive chronic kidney disease with stage 1 through stage 4 chronic kidney disease, or unspecified chronic kidney disease: Secondary | ICD-10-CM | POA: Diagnosis not present

## 2017-05-01 DIAGNOSIS — I825Z2 Chronic embolism and thrombosis of unspecified deep veins of left distal lower extremity: Secondary | ICD-10-CM | POA: Diagnosis not present

## 2017-05-01 DIAGNOSIS — I5031 Acute diastolic (congestive) heart failure: Secondary | ICD-10-CM | POA: Diagnosis not present

## 2017-05-01 DIAGNOSIS — N182 Chronic kidney disease, stage 2 (mild): Secondary | ICD-10-CM | POA: Diagnosis not present

## 2017-05-01 DIAGNOSIS — R262 Difficulty in walking, not elsewhere classified: Secondary | ICD-10-CM | POA: Diagnosis not present

## 2017-05-01 DIAGNOSIS — Z13220 Encounter for screening for lipoid disorders: Secondary | ICD-10-CM | POA: Diagnosis not present

## 2017-05-01 DIAGNOSIS — N179 Acute kidney failure, unspecified: Secondary | ICD-10-CM | POA: Diagnosis not present

## 2017-05-01 DIAGNOSIS — R600 Localized edema: Secondary | ICD-10-CM | POA: Diagnosis not present

## 2017-05-01 DIAGNOSIS — R488 Other symbolic dysfunctions: Secondary | ICD-10-CM | POA: Diagnosis not present

## 2017-05-01 DIAGNOSIS — I420 Dilated cardiomyopathy: Secondary | ICD-10-CM | POA: Diagnosis not present

## 2017-05-01 DIAGNOSIS — I825Y2 Chronic embolism and thrombosis of unspecified deep veins of left proximal lower extremity: Secondary | ICD-10-CM | POA: Diagnosis not present

## 2017-05-01 DIAGNOSIS — I359 Nonrheumatic aortic valve disorder, unspecified: Secondary | ICD-10-CM | POA: Diagnosis not present

## 2017-05-01 DIAGNOSIS — Z23 Encounter for immunization: Secondary | ICD-10-CM | POA: Diagnosis not present

## 2017-05-01 DIAGNOSIS — I509 Heart failure, unspecified: Secondary | ICD-10-CM | POA: Diagnosis not present

## 2017-05-01 DIAGNOSIS — J9602 Acute respiratory failure with hypercapnia: Secondary | ICD-10-CM | POA: Diagnosis not present

## 2017-05-01 DIAGNOSIS — D696 Thrombocytopenia, unspecified: Secondary | ICD-10-CM | POA: Diagnosis not present

## 2017-05-01 DIAGNOSIS — J9601 Acute respiratory failure with hypoxia: Secondary | ICD-10-CM | POA: Diagnosis not present

## 2017-05-01 DIAGNOSIS — I10 Essential (primary) hypertension: Secondary | ICD-10-CM | POA: Diagnosis not present

## 2017-05-01 DIAGNOSIS — F0390 Unspecified dementia without behavioral disturbance: Secondary | ICD-10-CM

## 2017-05-01 DIAGNOSIS — M6281 Muscle weakness (generalized): Secondary | ICD-10-CM | POA: Diagnosis not present

## 2017-05-01 LAB — GLUCOSE, CAPILLARY
GLUCOSE-CAPILLARY: 144 mg/dL — AB (ref 65–99)
Glucose-Capillary: 237 mg/dL — ABNORMAL HIGH (ref 65–99)

## 2017-05-01 MED ORDER — POTASSIUM CHLORIDE ER 20 MEQ PO TBCR: 20.0000 meq | EXTENDED_RELEASE_TABLET | Freq: Every day | ORAL | Status: DC

## 2017-05-01 MED ORDER — FUROSEMIDE 40 MG PO TABS: 40.0000 mg | ORAL_TABLET | Freq: Every day | ORAL | Status: DC

## 2017-05-01 MED ORDER — LISINOPRIL 2.5 MG PO TABS: 2.5000 mg | ORAL_TABLET | Freq: Every day | ORAL | Status: DC

## 2017-05-01 MED ORDER — METFORMIN HCL ER 500 MG PO TB24: 500.0000 mg | ORAL_TABLET | Freq: Two times a day (BID) | ORAL | Status: DC

## 2017-05-01 MED ORDER — CARVEDILOL 3.125 MG PO TABS: 3.1250 mg | ORAL_TABLET | Freq: Two times a day (BID) | ORAL | Status: DC

## 2017-05-01 MED ORDER — ASPIRIN 81 MG PO TBEC: 81.0000 mg | DELAYED_RELEASE_TABLET | Freq: Every day | ORAL | Status: DC

## 2017-05-01 MED ORDER — ISOSORBIDE MONONITRATE ER 30 MG PO TB24: 15.0000 mg | ORAL_TABLET | Freq: Every day | ORAL | Status: DC

## 2017-05-01 MED ORDER — APIXABAN 5 MG PO TABS: ORAL_TABLET | ORAL | Status: DC

## 2017-05-01 NOTE — Clinical Social Work Placement (Signed)
   CLINICAL SOCIAL WORK PLACEMENT  NOTE  Date:  05/01/2017  Patient Details  Name: Charlene Rubio MRN: [id number] Date of Birth: 1945/11/18  Clinical Social Work is seeking post-discharge placement for this patient at the [place] level of care (*CSW will initial, date and re-position this form in  chart as items are completed):  Yes   Patient/family provided with [facility] Work Department's list of facilities offering this level of care within the geographic area requested by the patient (or if unable, by the patient's family).  Yes   Patient/family informed of their freedom to choose among providers that offer the needed level of care, that participate in Medicare, Medicaid or managed care program needed by the patient, have an available bed and are willing to accept the patient.  Yes   Patient/family informed of [facility]'s ownership interest in [facility] and [facility], as well as of the fact that they are under no obligation to receive care at these facilities.  PASRR submitted to EDS on 04/27/17     PASRR number received on 04/27/17     Existing PASRR number confirmed on       FL2 transmitted to all facilities in geographic area requested by pt/family on 04/27/17     FL2 transmitted to all facilities within larger geographic area on       Patient informed that his/her managed care company has contracts with or will negotiate with certain facilities, including the following:        Yes   Patient/family informed of bed offers received.  Patient chooses bed at [facility]     Physician recommends and patient chooses bed at      Patient to be transferred to [facility] on 05/01/17.  Patient to be transferred to facility by Husband to transport     Patient family notified on 05/01/17 of transfer.  Name of family member notified:  [name]     PHYSICIAN Please prepare prescriptions     Additional  Comment:    _______________________________________________ Candie Chroman, LCSW 05/01/2017, 2:16 PM

## 2017-05-01 NOTE — Progress Notes (Signed)
Occupational Therapy Treatment Patient Details Name: Charlene Rubio MRN: [id number] DOB: Oct 01, 1945 Today's Date: 05/01/2017    History of present illness 71 yr old lady with suspected dementia, HTN not treated admitted with shortness of breath and bil LE swelling.   OT comments  Pt willing to work with OT and very pleasant.    Follow Up Recommendations  SNF;Supervision/Assistance - 24 hour    Equipment Recommendations  Other (comment) (TBD)    Recommendations for Other Services      Precautions / Restrictions Precautions Precautions: (P) Fall       Mobility Bed Mobility Overal bed mobility: (P) Needs Assistance Bed Mobility: (P) Supine to Sit     Supine to sit: (P) Min guard     General bed mobility comments: pt in chair  Transfers Overall transfer level: Needs assistance Equipment used: Rolling walker (2 wheeled) Transfers: Sit to/from Stand Sit to Stand: Max assist         General transfer comment: assist to power up into standing    Balance Overall balance assessment: (P) Needs assistance Sitting-balance support: (P) Feet supported;No upper extremity supported Sitting balance-Leahy Scale: (P) Fair     Standing balance support: (P) During functional activity;Bilateral upper extremity supported Standing balance-Leahy Scale: (P) Poor Standing balance comment: (P) Mod assist for standing balance during peri care                           ADL either performed or assessed with clinical judgement   ADL                       Lower Body Dressing: Maximal assistance;Sit to/from stand;+2 for physical assistance;Total assistance   Toilet Transfer: RW;Maximal assistance Toilet Transfer Details (indicate cue type and reason): pt sitting in recliner and needed max A to stand.  Recliner is a bit low ,once pt in standing able to stand with min A with a posterior lean. Pt did have trouble transitioning to sitting from standing.  Pt needed hand held  A to initiate transition Toileting- Clothing Manipulation and Hygiene: Maximal assistance;Sit to/from stand               Vision Patient Visual Report: No change from baseline            Cognition Arousal/Alertness: Awake/alert Behavior During Therapy: Restless Overall Cognitive Status: History of cognitive impairments - at baseline Area of Impairment: Orientation;Attention;Memory;Following commands;Safety/judgement;Awareness;Problem solving                 Orientation Level: Disoriented to;Time;Situation;Place Current Attention Level: Focused Memory: Decreased short-term memory Following Commands: Follows one step commands inconsistently;Follows one step commands with increased time Safety/Judgement: Decreased awareness of safety;Decreased awareness of deficits Awareness: Intellectual Problem Solving: Slow processing;Decreased initiation;Difficulty sequencing;Requires verbal cues;Requires tactile cues                     Pertinent Vitals/ Pain       Pain Assessment: (P) No/denies pain         Frequency  Min 2X/week        Progress Toward Goals  OT Goals(current goals can now be found in the care plan section)  Progress towards OT goals: Progressing toward goals     Plan Discharge plan remains appropriate    Co-evaluation                 AM-PAC PT "6 Clicks" Daily  Activity     Outcome Measure   Help from another person eating meals?: None Help from another person taking care of personal grooming?: A Little Help from another person toileting, which includes using toliet, bedpan, or urinal?: A Lot Help from another person bathing (including washing, rinsing, drying)?: A Lot Help from another person to put on and taking off regular upper body clothing?: A Little Help from another person to put on and taking off regular lower body clothing?: A Lot 6 Click Score: 16    End of Session Equipment Utilized During Treatment: Gait  belt;Oxygen  OT Visit Diagnosis: Unsteadiness on feet (R26.81);Other abnormalities of gait and mobility (R26.89);History of falling (Z91.81);Muscle weakness (generalized) (M62.81)   Activity Tolerance Patient tolerated treatment well   Patient Left in chair;with call bell/phone within reach;with chair alarm set   Nurse Communication Mobility status        Time: 1300-1315 OT Time Calculation (min): 15 min  Charges: OT General Charges $OT Visit: 1 Visit OT Treatments $Self Care/Home Management : 8-22 mins  [place], [place]   Betsy Pries 05/01/2017, 1:33 PM

## 2017-05-01 NOTE — Progress Notes (Signed)
[facility] paged informed that patient had 8 beats of Vtach asymptomatic was asleep easily aroused no complaint of chest pain bp 96/44 HR 65. Will continue to monitor. Arthor Captain LPN

## 2017-05-01 NOTE — Discharge Summary (Signed)
Physician Discharge Summary  Charlene Rubio ZOX:[id number] DOB: [birth year] DOA: 04/25/2017  PCP: [name], DO  Admit date: 04/25/2017 Discharge date: 05/01/2017  Admitted From: Home  Disposition: SNF   Recommendations for Outpatient Follow-up:  1. Follow up with PCP in 1 weeks 2. Follow up with cardiologist in 2 weeks 3. Eliquis take 10 mg BID thru 9/18, then on 9/19 take 5 mg po BID.  4. Monitor for bleeding on eliquis therapy.   5. Please monitor blood sugars at least 2 times per day 6. Please obtain BMP/CBC in one week 7. Please further discuss evaluation of left breast mass, at this time husband declined biopsy   Discharge Condition: STABLE   CODE STATUS: DNR    Brief Hospitalization Summary: Please see all hospital notes, images, labs for full details of the hospitalization. HISTORY OF PRESENT ILLNESS:   71 yr old lady with suspected dementia, HTN not treated who is coming in with shortness of breath. As per husband she has been short of breath for 3 weeks progressing with increase lower limb swelling. Over the last few days she has been bed bound and not able to walk from shortness of breath. He states that she has severe anxiety from doctors and she is worried that she will be diagnosed with dementia and his husband has noticed that she has been very forgetful. She had no history of fever, chills, cough or sputum. She was diagnosed with HTN a while ago but refused to use her medicines.  In the ED she was found to have bilateral infiltrates/pulm edema and severe hyperglycemia. Patient is being put on BIPAP. She is lethargic but is easily arousable. Not participating her history .    Brief Summary:  71 yr old lady with suspected dementia, HTN not treated who is coming in with shortness of breath. As per husband she has been short of breath for 3 weeks progressing with increase lower limb swelling. Over the last few days she has been bed bound and not able to walk from shortness of  breath. He states that she has severe anxiety from doctors and she is worried that she will be diagnosed with dementia and his husband has noticed that she has been very forgetful. She had no history of fever, chills, cough or sputum. She was diagnosed with HTN a while ago but refused to use her medicines.  In the ED she was found to have bilateral infiltrates/pulm edema and severe hyperglycemia. Patient is being put on BIPAP. She is lethargic but is easily arousable. Not participating her history .   Per husband, patient has not seen a physician for the last 75yrs, she does not take any meds at home for many yrs. Husband has noticed patient started to have memory issues, but report patient still drives 4weeks prior to coming to the hospital.   Significant Events: 9/7 admitted to ICU on bipap 9/8 TTE - EF 25-30% - grade 1 DD - mod AoV regurg - mild MV regurg  9/10 [facility] assumed care   HPI/Recap of past 24 hours:  Pt without complaints today.    Assessment/Plan: Principal Problem:   Acute diastolic (congestive) heart failure (HCC) Active Problems:   Obesity, unspecified   Essential hypertension   Noncompliance   CHF (congestive heart failure) (HCC)   Acute respiratory failure with hypoxia and hypercapnia   Diabetes mellitus ([place])  Acute hypoxic rep failure w/ pulmonary edema /chf ef 25-30% -Has required intermittent BIPAP support - wean off O2 as  able - appears to be stabilizing quickly w/ diuresis  -No WMA on TTE suggesting probable HTN etiology - she would not be a candidate for aggressive tx given her rapidly progressing dementia  -husband is not interested in aggressive measures -outpatient cardiology follow up  -continue diuresis as long as bp and renal function allows - currently on lasix, coreg, imdur, lisinopril,  titrate meds to find optimal care  - started oral lasix   Bilateral lower extremity edema:  Venous doppler ordered to r/o DVT Continue lasix  AKI on  CKDII: ua no infection. Cr 1.22 on admission, cr trending down.  Renal dosing meds  Thrombocytopenia:  plt nadir at 81 Improving at 98. No bleeding, recheck CBC in 1 week.   Chronic DVT LLE - eliquis per pharmacy, TED [place], anticoagulate for at least 3 months. Follow up outpatient with PCP to further manage, likely could come off anticoagulation in 3 months but will defer to PCP.    New diagnosis of diabetes: a1c 7.8 Glucose 428 on admission Started on On ssi here Am fasting blood sugar range from 68-195 last three days Started oral glucophage XR BID with meals.  Confusion: progressive dementia Husband report patient still drive 4weeks ago, but he noticed signs of confusion and progressive cognitive decline for the last few years CT head with chronic microvascular changes.   Left Breast mass: left breast, nontender, no erythema, about 6 cm in diameter, I have discussed with husband, husband is not interested in aggressive intervention. He is not interested on biopsy. Follow up with PCP after discharge.    FTT, snf placement.  Code Status: DNR  Family Communication: patient and husband at bedside  Disposition Plan: SNF ( husband is agreeable), social worker arranged   Consultants:  Critical care admission, transferred to hospitalist team one on 9/10.   Procedures:  bipap  Antibiotics:  none  Discharge Diagnoses:  Principal Problem:   Acute diastolic (congestive) heart failure (HCC) Active Problems:   Obesity, unspecified   Essential hypertension   Noncompliance   CHF (congestive heart failure) (HCC)   Acute respiratory failure with hypoxia and hypercapnia (HCC)   Diabetes mellitus ([place])   Dementia  Discharge Instructions: Discharge Instructions    Ambulatory referral to Cardiology    Complete by:  As directed    Ambulatory referral to Neurology    Complete by:  As directed    An appointment is requested in approximately: 4 weeks   Call MD  for:  difficulty breathing, headache or visual disturbances    Complete by:  As directed    Call MD for:  extreme fatigue    Complete by:  As directed    Call MD for:  persistant nausea and vomiting    Complete by:  As directed    Call MD for:  severe uncontrolled pain    Complete by:  As directed    Diet - low sodium heart healthy    Complete by:  As directed    Increase activity slowly    Complete by:  As directed      Allergies as of 05/01/2017   No Known Allergies     Medication List    TAKE these medications   apixaban 5 MG Tabs tablet Commonly known as:  ELIQUIS Take 2 tabs po BID the 9/18, then on 9/19 take 1 tab po BID   aspirin 81 MG EC tablet Take 1 tablet (81 mg total) by mouth daily.   carvedilol 3.125 MG  tablet Commonly known as:  COREG Take 1 tablet (3.125 mg total) by mouth 2 (two) times daily with a meal.   furosemide 40 MG tablet Commonly known as:  LASIX Take 1 tablet (40 mg total) by mouth daily.   isosorbide mononitrate 30 MG 24 hr tablet Commonly known as:  IMDUR Take 0.5 tablets (15 mg total) by mouth daily.   lisinopril 2.5 MG tablet Commonly known as:  PRINIVIL,ZESTRIL Take 1 tablet (2.5 mg total) by mouth daily.   metFORMIN 500 MG 24 hr tablet Commonly known as:  GLUCOPHAGE-XR Take 1 tablet (500 mg total) by mouth 2 (two) times daily with a meal.            Discharge Care Instructions        Start     Ordered   05/02/17 0000  aspirin EC 81 MG EC tablet  Daily     05/01/17 1241   05/02/17 0000  furosemide (LASIX) 40 MG tablet  Daily     05/01/17 1241   05/02/17 0000  isosorbide mononitrate (IMDUR) 30 MG 24 hr tablet  Daily     05/01/17 1241   05/02/17 0000  lisinopril (PRINIVIL,ZESTRIL) 2.5 MG tablet  Daily     05/01/17 1241   05/01/17 0000  apixaban (ELIQUIS) 5 MG TABS tablet     05/01/17 1241   05/01/17 0000  carvedilol (COREG) 3.125 MG tablet  2 times daily with meals     05/01/17 1241   05/01/17 0000  metFORMIN  (GLUCOPHAGE-XR) 500 MG 24 hr tablet  2 times daily with meals     05/01/17 1241   05/01/17 0000  Increase activity slowly     05/01/17 1241   05/01/17 0000  Diet - low sodium heart healthy     05/01/17 1241   05/01/17 0000  Call MD for:  extreme fatigue     05/01/17 1241   05/01/17 0000  Call MD for:  severe uncontrolled pain     05/01/17 1241   05/01/17 0000  Call MD for:  persistant nausea and vomiting     05/01/17 1241   05/01/17 0000  Call MD for:  difficulty breathing, headache or visual disturbances     05/01/17 1241   04/30/17 0000  Ambulatory referral to Neurology    Comments:  An appointment is requested in approximately: 4 weeks   04/30/17 1133   04/30/17 0000  Ambulatory referral to Cardiology     04/30/17 1133      Contact information for follow-up providers    [name], DO. Schedule an appointment as soon as possible for a visit in 2 week(s).   Specialty:  Internal Medicine Contact information: [place] [phone]        [facility]. Schedule an appointment as soon as possible for a visit in 1 month(s).   Contact information: [place], [place] [facility]. Schedule an appointment as soon as possible for a visit in 3 week(s).   Specialty:  Cardiology Why:  Hospital Follow Up CHF Contact information: [address] [place] [phone]           Contact information for after-discharge care    Destination    [facility] Follow up.   Specialty:  [place] information: [address] [place]  [place] [phone]                 No Known Allergies Current Discharge Medication List    START taking these medications   Details  apixaban (ELIQUIS) 5 MG TABS tablet Take 2 tabs po BID the 9/18, then on 9/19 take  1 tab po BID Qty: 60 tablet    aspirin EC 81 MG EC tablet Take 1 tablet (81 mg total) by mouth daily.    carvedilol (COREG) 3.125 MG tablet Take 1 tablet (3.125 mg total) by mouth 2 (two) times daily with a meal.    furosemide (LASIX) 40 MG tablet Take 1 tablet (40 mg total) by mouth daily. Qty: 30 tablet    isosorbide mononitrate (IMDUR) 30 MG 24 hr tablet Take 0.5 tablets (15 mg total) by mouth daily.    lisinopril (PRINIVIL,ZESTRIL) 2.5 MG tablet Take 1 tablet (2.5 mg total) by mouth daily.    metFORMIN (GLUCOPHAGE-XR) 500 MG 24 hr tablet Take 1 tablet (500 mg total) by mouth 2 (two) times daily with a meal.        Procedures/Studies: Ct Head Wo Contrast  Result Date: 04/29/2017 CLINICAL DATA:  71 y/o  F; increase forgetfulness and lethargy. EXAM: CT HEAD WITHOUT CONTRAST TECHNIQUE: Contiguous axial images were obtained from the base of the skull through the vertex without intravenous contrast. COMPARISON:  None. FINDINGS: Brain: No evidence of acute infarction, hemorrhage, hydrocephalus, extra-axial collection or mass lesion/mass effect. Moderate chronic microvascular ischemic changes of white matter parenchymal volume loss of the brain. Small chronic lacunar infarcts within the cerebellar hemispheres bilaterally, the left thalamus, and the right caudate head. Vascular: Extensive calcific atherosclerosis of carotid siphons. Skull: Normal. Negative for fracture or focal lesion. Sinuses/Orbits: No acute finding. Other: None. IMPRESSION: 1. No acute intracranial abnormality. 2. Moderate chronic microvascular ischemic changes and parenchymal volume loss of the brain. Small chronic lacunar infarcts in cerebellum and basal ganglia. Electronically Signed   By: Kristine Garbe M.D.   On: 04/29/2017 22:45   Dg Chest Port 1 View  Result Date: 04/28/2017 CLINICAL DATA:  Shortness of breath, acute respiratory failure EXAM: PORTABLE CHEST 1 VIEW COMPARISON:  04/27/2017 FINDINGS:  Cardiomegaly with mild interstitial edema. Suspected trace right and small left pleural effusions. No pneumothorax. IMPRESSION: Cardiomegaly with mild interstitial edema, unchanged. Bilateral pleural effusions, left greater than right. Electronically Signed   By: Julian Hy M.D.   On: 04/28/2017 07:20   Dg Chest Port 1 View  Result Date: 04/27/2017 CLINICAL DATA:  Respiratory failure EXAM: PORTABLE CHEST 1 VIEW COMPARISON:  92,018 FINDINGS: Bilateral airspace disease unchanged. Left lower lobe atelectasis and effusion unchanged. Small right effusion. IMPRESSION: No significant interval change. Probable heart failure with pulmonary edema and pleural effusions. Electronically Signed   By: Franchot Gallo M.D.   On: 04/27/2017 07:19   Dg Chest Port 1 View  Result Date: 04/26/2017 CLINICAL DATA:  Short of breath EXAM: PORTABLE CHEST 1 VIEW COMPARISON:  04/25/2017 FINDINGS: Progression of diffuse bilateral airspace disease compatible with worsening pulmonary edema. Progression of left effusion and left lower lobe atelectasis. IMPRESSION: Worsening heart failure with increase in edema and left effusion. Progression of left lower lobe atelectasis. Electronically Signed   By: Franchot Gallo M.D.   On: 04/26/2017 08:11   Dg Chest Port 1 View  Result Date: 04/25/2017 CLINICAL DATA:  Altered mental status for several weeks, increased agitation. EXAM: PORTABLE CHEST 1 VIEW COMPARISON:  None. FINDINGS: Patchy bilateral airspace opacities, most  prominent at the left lung base. Heart size cannot be characterized as margins are obscured by the overlying airspace opacities. Atherosclerotic changes noted at the aortic arch. No acute or suspicious osseous finding. IMPRESSION: 1. Patchy bilateral airspace opacities, most likely pulmonary edema. 2. Denser opacity at the left lung base could represent pneumonia, atelectasis and/or small pleural effusion. 3.  Aortic atherosclerosis. Electronically Signed   By: Franki Cabot  M.D.   On: 04/25/2017 18:03      Subjective: PT without complaints.    Discharge Exam: Vitals:   05/01/17 0600 05/01/17 1217  BP: (!) 92/42 117/67  Pulse: 64 84  Resp: 15 18  Temp: 97.9 F (36.6 C) 98 F (36.7 C)  SpO2: 97% 98%   Vitals:   04/30/17 2107 05/01/17 0127 05/01/17 0600 05/01/17 1217  BP: (!) 107/53 (!) 96/44 (!) 92/42 117/67  Pulse: 73 65 64 84  Resp: 16  15 18   Temp: 98 F (36.7 C)  97.9 F (36.6 C) 98 F (36.7 C)  TempSrc: Oral  Oral Oral  SpO2: 99%  97% 98%  Weight:   71.8 kg (158 lb 3.2 oz)   Height:       General:  Chronically ill, demented, only oriented to person, calm and cooperative  Cardiovascular: RRR  Respiratory: CTABL  Abdomen: Soft/ND/NT, positive BS  Musculoskeletal: bilateral pitting edema, generalized weakness, not able to life legs against gravity   Neuro: alert, oriented to person only    The results of significant diagnostics from this hospitalization (including imaging, microbiology, ancillary and laboratory) are listed below for reference.     Microbiology: Recent Results (from the past 240 hour(s))  Culture, blood (routine x 2)     Status: Abnormal   Collection Time: 04/25/17  5:50 PM  Result Value Ref Range Status   Specimen Description BLOOD RIGHT WRIST  Final   Special Requests   Final    BOTTLES DRAWN AEROBIC AND ANAEROBIC Blood Culture adequate volume   Culture  Setup Time   Final    GRAM POSITIVE COCCI IN CLUSTERS AEROBIC BOTTLE ONLY CRITICAL RESULT CALLED TO, READ BACK BY AND VERIFIED WITH: R RUMBARGER,PHARMD AT 0347 04/26/17 BY L BENFIELD    Culture (A)  Final    STAPHYLOCOCCUS SPECIES (COAGULASE NEGATIVE) THE SIGNIFICANCE OF ISOLATING THIS ORGANISM FROM A SINGLE SET OF BLOOD CULTURES WHEN MULTIPLE SETS ARE DRAWN IS UNCERTAIN. PLEASE NOTIFY THE MICROBIOLOGY DEPARTMENT WITHIN ONE WEEK IF SPECIATION AND SENSITIVITIES ARE REQUIRED.    Report Status 04/28/2017 FINAL  Final  Blood Culture ID Panel (Reflexed)      Status: Abnormal   Collection Time: 04/25/17  5:50 PM  Result Value Ref Range Status   Enterococcus species NOT DETECTED NOT DETECTED Final   Vancomycin resistance NOT DETECTED NOT DETECTED Final   Listeria monocytogenes NOT DETECTED NOT DETECTED Final   Staphylococcus species DETECTED (A) NOT DETECTED Final    Comment: CRITICAL RESULT CALLED TO, READ BACK BY AND VERIFIED WITH: R RUMBARGER,PHARMD AT 1155 04/26/17 BY L BENFIELD    Staphylococcus aureus NOT DETECTED NOT DETECTED Final   Methicillin resistance NOT DETECTED NOT DETECTED Final   Streptococcus species NOT DETECTED NOT DETECTED Final   Streptococcus agalactiae NOT DETECTED NOT DETECTED Final   Streptococcus pneumoniae NOT DETECTED NOT DETECTED Final   Streptococcus pyogenes NOT DETECTED NOT DETECTED Final   Acinetobacter baumannii NOT DETECTED NOT DETECTED Final   Enterobacteriaceae species NOT DETECTED NOT DETECTED Final   Enterobacter cloacae complex NOT DETECTED NOT  DETECTED Final   Escherichia coli NOT DETECTED NOT DETECTED Final   Klebsiella oxytoca NOT DETECTED NOT DETECTED Final   Klebsiella pneumoniae NOT DETECTED NOT DETECTED Final   Proteus species NOT DETECTED NOT DETECTED Final   Serratia marcescens NOT DETECTED NOT DETECTED Final   Carbapenem resistance NOT DETECTED NOT DETECTED Final   Haemophilus influenzae NOT DETECTED NOT DETECTED Final   Neisseria meningitidis NOT DETECTED NOT DETECTED Final   Pseudomonas aeruginosa NOT DETECTED NOT DETECTED Final   Candida albicans NOT DETECTED NOT DETECTED Final   Candida glabrata NOT DETECTED NOT DETECTED Final   Candida krusei NOT DETECTED NOT DETECTED Final   Candida parapsilosis NOT DETECTED NOT DETECTED Final   Candida tropicalis NOT DETECTED NOT DETECTED Final  Culture, blood (routine x 2)     Status: None   Collection Time: 04/25/17  6:12 PM  Result Value Ref Range Status   Specimen Description BLOOD LEFT ANTECUBITAL  Final   Special Requests   Final    BOTTLES  DRAWN AEROBIC AND ANAEROBIC Blood Culture adequate volume   Culture NO GROWTH 5 DAYS  Final   Report Status 04/30/2017 FINAL  Final  MRSA PCR Screening     Status: None   Collection Time: 04/25/17 10:52 PM  Result Value Ref Range Status   MRSA by PCR NEGATIVE NEGATIVE Final    Comment:        The GeneXpert MRSA Assay (FDA approved for NASAL specimens only), is one component of a comprehensive MRSA colonization surveillance program. It is not intended to diagnose MRSA infection nor to guide or monitor treatment for MRSA infections.      Labs: BNP (last 3 results)  Recent Labs  04/25/17 1812 04/28/17 0219  BNP >4,500.0* 6,606.3*   Basic Metabolic Panel:  Recent Labs Lab 04/27/17 0337 04/27/17 1517 04/28/17 0218 04/29/17 0533 04/30/17 0525  NA 145 142 140 138 138  K 2.9* 3.4* 3.6 3.4* 3.9  CL 101 98* 98* 96* 96*  CO2 32 34* 32 35* 33*  GLUCOSE 89 195* 68 149* 133*  BUN 20 18 22* 22* 21*  CREATININE 0.88 0.95 0.88 0.91 0.89  CALCIUM 7.9* 8.2* 8.0* 7.8* 8.2*  MG  --   --  1.8  --  2.0  PHOS  --   --  3.5  --   --    Liver Function Tests:  Recent Labs Lab 04/25/17 1310 04/27/17 0337 04/30/17 0525  AST 33 30 29  ALT 42 34 28  ALKPHOS 63 46 45  BILITOT 1.8* 1.3* 1.3*  PROT 6.1* 4.7* 5.1*  ALBUMIN 3.9 2.8* 3.0*   No results for input(s): LIPASE, AMYLASE in the last 168 hours.  Recent Labs Lab 04/30/17 0525  AMMONIA 36*   CBC:  Recent Labs Lab 04/26/17 0455 04/27/17 0337 04/28/17 0218 04/29/17 0533 04/30/17 0525  WBC 8.6 7.6 9.3 6.3 9.1  HGB 15.0 14.6 15.5* 14.3 16.7*  HCT 47.0* 45.8 47.2* 44.1 49.8*  MCV 96.1 96.4 95.4 96.5 96.1  PLT 81* 85* 114* 102* 98*   Cardiac Enzymes:  Recent Labs Lab 04/26/17 0943  TROPONINI 0.37*   BNP: Invalid input(s): POCBNP CBG:  Recent Labs Lab 04/30/17 1155 04/30/17 1600 04/30/17 2105 05/01/17 0725 05/01/17 1132  GLUCAP 147* 191* 221* 144* 237*   D-Dimer No results for input(s): DDIMER in  the last 72 hours. Hgb A1c No results for input(s): HGBA1C in the last 72 hours. Lipid Profile No results for input(s): CHOL, HDL, LDLCALC, TRIG,  CHOLHDL, LDLDIRECT in the last 72 hours. Thyroid function studies No results for input(s): TSH, T4TOTAL, T3FREE, THYROIDAB in the last 72 hours.  Invalid input(s): FREET3 Anemia work up  Recent Labs  04/30/17 0525  VITAMINB12 338  FOLATE 12.0   Urinalysis    Component Value Date/Time   COLORURINE AMBER (A) 04/25/2017 1918   APPEARANCEUR HAZY (A) 04/25/2017 1918   LABSPEC 1.022 04/25/2017 1918   PHURINE 5.0 04/25/2017 1918   GLUCOSEU >=500 (A) 04/25/2017 1918   HGBUR SMALL (A) 04/25/2017 1918   HGBUR negative 02/08/2009 1007   BILIRUBINUR NEGATIVE 04/25/2017 1918   BILIRUBINUR Neg 04/25/2017 1132   KETONESUR NEGATIVE 04/25/2017 1918   PROTEINUR >=300 (A) 04/25/2017 1918   UROBILINOGEN 2.0 (A) 04/25/2017 1132   UROBILINOGEN 0.2 02/08/2009 1007   NITRITE NEGATIVE 04/25/2017 1918   LEUKOCYTESUR NEGATIVE 04/25/2017 1918   Sepsis Labs Invalid input(s): PROCALCITONIN,  WBC,  LACTICIDVEN Microbiology Recent Results (from the past 240 hour(s))  Culture, blood (routine x 2)     Status: Abnormal   Collection Time: 04/25/17  5:50 PM  Result Value Ref Range Status   Specimen Description BLOOD RIGHT WRIST  Final   Special Requests   Final    BOTTLES DRAWN AEROBIC AND ANAEROBIC Blood Culture adequate volume   Culture  Setup Time   Final    GRAM POSITIVE COCCI IN CLUSTERS AEROBIC BOTTLE ONLY CRITICAL RESULT CALLED TO, READ BACK BY AND VERIFIED WITH: R RUMBARGER,PHARMD AT 8413 04/26/17 BY L BENFIELD    Culture (A)  Final    STAPHYLOCOCCUS SPECIES (COAGULASE NEGATIVE) THE SIGNIFICANCE OF ISOLATING THIS ORGANISM FROM A SINGLE SET OF BLOOD CULTURES WHEN MULTIPLE SETS ARE DRAWN IS UNCERTAIN. PLEASE NOTIFY THE MICROBIOLOGY DEPARTMENT WITHIN ONE WEEK IF SPECIATION AND SENSITIVITIES ARE REQUIRED.    Report Status 04/28/2017 FINAL  Final  Blood  Culture ID Panel (Reflexed)     Status: Abnormal   Collection Time: 04/25/17  5:50 PM  Result Value Ref Range Status   Enterococcus species NOT DETECTED NOT DETECTED Final   Vancomycin resistance NOT DETECTED NOT DETECTED Final   Listeria monocytogenes NOT DETECTED NOT DETECTED Final   Staphylococcus species DETECTED (A) NOT DETECTED Final    Comment: CRITICAL RESULT CALLED TO, READ BACK BY AND VERIFIED WITH: R RUMBARGER,PHARMD AT 1155 04/26/17 BY L BENFIELD    Staphylococcus aureus NOT DETECTED NOT DETECTED Final   Methicillin resistance NOT DETECTED NOT DETECTED Final   Streptococcus species NOT DETECTED NOT DETECTED Final   Streptococcus agalactiae NOT DETECTED NOT DETECTED Final   Streptococcus pneumoniae NOT DETECTED NOT DETECTED Final   Streptococcus pyogenes NOT DETECTED NOT DETECTED Final   Acinetobacter baumannii NOT DETECTED NOT DETECTED Final   Enterobacteriaceae species NOT DETECTED NOT DETECTED Final   Enterobacter cloacae complex NOT DETECTED NOT DETECTED Final   Escherichia coli NOT DETECTED NOT DETECTED Final   Klebsiella oxytoca NOT DETECTED NOT DETECTED Final   Klebsiella pneumoniae NOT DETECTED NOT DETECTED Final   Proteus species NOT DETECTED NOT DETECTED Final   Serratia marcescens NOT DETECTED NOT DETECTED Final   Carbapenem resistance NOT DETECTED NOT DETECTED Final   Haemophilus influenzae NOT DETECTED NOT DETECTED Final   Neisseria meningitidis NOT DETECTED NOT DETECTED Final   Pseudomonas aeruginosa NOT DETECTED NOT DETECTED Final   Candida albicans NOT DETECTED NOT DETECTED Final   Candida glabrata NOT DETECTED NOT DETECTED Final   Candida krusei NOT DETECTED NOT DETECTED Final   Candida parapsilosis NOT DETECTED NOT DETECTED Final  Candida tropicalis NOT DETECTED NOT DETECTED Final  Culture, blood (routine x 2)     Status: None   Collection Time: 04/25/17  6:12 PM  Result Value Ref Range Status   Specimen Description BLOOD LEFT ANTECUBITAL  Final    Special Requests   Final    BOTTLES DRAWN AEROBIC AND ANAEROBIC Blood Culture adequate volume   Culture NO GROWTH 5 DAYS  Final   Report Status 04/30/2017 FINAL  Final  MRSA PCR Screening     Status: None   Collection Time: 04/25/17 10:52 PM  Result Value Ref Range Status   MRSA by PCR NEGATIVE NEGATIVE Final    Comment:        The GeneXpert MRSA Assay (FDA approved for NASAL specimens only), is one component of a comprehensive MRSA colonization surveillance program. It is not intended to diagnose MRSA infection nor to guide or monitor treatment for MRSA infections.    Time coordinating discharge: 39 mins  SIGNED:  Irwin Brakeman, MD  [facility] 05/01/2017, 12:44 PM Pager [phone]  If 7PM-7AM, please contact night-coverage [email or web address] Password [email or web address]

## 2017-05-01 NOTE — Progress Notes (Signed)
Physical Therapy Treatment Patient Details Name: Charlene Rubio MRN: [id number] DOB: 06/25/1946 Today's Date: 05/01/2017    History of Present Illness 71 yr old lady with suspected dementia, HTN not treated admitted with shortness of breath and bil LE swelling.    PT Comments    Patient tolerated increased activity this session and able to follow simple commands. Pt required +2 assist for safety with OOB. Continue to progress as tolerated with anticipated d/c to SNF for further skilled PT services.     Follow Up Recommendations  SNF     Equipment Recommendations  Rolling walker with 5" wheels;3in1 (PT)    Recommendations for Other Services       Precautions / Restrictions Precautions Precautions: Fall    Mobility  Bed Mobility Overal bed mobility: Needs Assistance Bed Mobility: Supine to Sit     Supine to sit: Min guard     General bed mobility comments: pt in chair  Transfers Overall transfer level: Needs assistance Equipment used: Rolling walker (2 wheeled) Transfers: Sit to/from Stand Sit to Stand: Max assist Stand pivot transfers: Mod assist;+2 safety/equipment       General transfer comment: assist to power up into standing  Ambulation/Gait Ambulation/Gait assistance: +2 physical assistance;Mod assist Ambulation Distance (Feet): 65 Feet Assistive device: Rolling walker (2 wheeled) Gait Pattern/deviations: Decreased step length - right;Decreased step length - left;Trunk flexed;Step-through pattern Gait velocity: decreased   General Gait Details: verbal and visual cues required for safe use of AD, increased bilat step lengths, and multimodal cues for posture and forward gaze; assist to steady and to manage RW   Stairs            Wheelchair Mobility    Modified Rankin (Stroke Patients Only)       Balance Overall balance assessment: Needs assistance Sitting-balance support: Feet supported;No upper extremity supported Sitting balance-Leahy  Scale: Fair     Standing balance support: During functional activity;Bilateral upper extremity supported Standing balance-Leahy Scale: Poor                              Cognition Arousal/Alertness: Awake/alert Behavior During Therapy: Restless Overall Cognitive Status: History of cognitive impairments - at baseline Area of Impairment: Orientation;Attention;Memory;Following commands;Safety/judgement;Awareness;Problem solving                 Orientation Level: Disoriented to;Time;Situation;Place Current Attention Level: Focused Memory: Decreased short-term memory Following Commands: Follows one step commands inconsistently;Follows one step commands with increased time Safety/Judgement: Decreased awareness of safety;Decreased awareness of deficits Awareness: Intellectual Problem Solving: Slow processing;Decreased initiation;Difficulty sequencing;Requires verbal cues;Requires tactile cues        Exercises      General Comments        Pertinent Vitals/Pain Pain Assessment: No/denies pain    Home Living                      Prior Function            PT Goals (current goals can now be found in the care plan section) Acute Rehab PT Goals PT Goal Formulation: Patient unable to participate in goal setting Time For Goal Achievement: 05/11/17 Potential to Achieve Goals: Good Progress towards PT goals: Progressing toward goals    Frequency    Min 2X/week      PT Plan Current plan remains appropriate    Co-evaluation  AM-PAC PT "6 Clicks" Daily Activity  Outcome Measure  Difficulty turning over in bed (including adjusting bedclothes, sheets and blankets)?: A Lot Difficulty moving from lying on back to sitting on the side of the bed? : Unable Difficulty sitting down on and standing up from a chair with arms (e.g., wheelchair, bedside commode, etc,.)?: Unable Help needed moving to and from a bed to chair (including a  wheelchair)?: A Lot Help needed walking in hospital room?: A Lot Help needed climbing 3-5 steps with a railing? : A Lot 6 Click Score: 10    End of Session Equipment Utilized During Treatment: Gait belt Activity Tolerance: Patient tolerated treatment well Patient left: in chair;with call bell/phone within reach;with chair alarm set Nurse Communication: Mobility status PT Visit Diagnosis: Unsteadiness on feet (R26.81);Other abnormalities of gait and mobility (R26.89);Muscle weakness (generalized) (M62.81)     Time: 5189-8421 PT Time Calculation (min) (ACUTE ONLY): 34 min  Charges:  $Gait Training: 8-22 mins $Therapeutic Activity: 8-22 mins                    G Codes:       Earney Navy, PTA Pager: [phone]     Darliss Cheney 05/01/2017, 1:35 PM

## 2017-05-01 NOTE — Clinical Social Work Note (Signed)
CSW facilitated patient discharge including contacting patient family and facility to confirm patient discharge plans. Clinical information faxed to facility and family agreeable with plan. Patient's husband will transport by car to [facility]. RN to call report prior to discharge [phone]).  CSW will sign off for now as social work intervention is no longer needed. Please consult [place] again if new needs arise.  Dayton Scrape, [place]

## 2017-05-01 NOTE — Clinical Social Work Note (Signed)
SNF admissions coordinator will contact CSW when patient's room is clean. CSW notified patient's husband and Therapist, sports.  Dayton Scrape, [place]

## 2017-05-02 ENCOUNTER — Non-Acute Institutional Stay (SKILLED_NURSING_FACILITY): Payer: Medicare Other | Admitting: Internal Medicine

## 2017-05-02 ENCOUNTER — Encounter: Payer: Self-pay | Admitting: Internal Medicine

## 2017-05-02 DIAGNOSIS — N6320 Unspecified lump in the left breast, unspecified quadrant: Secondary | ICD-10-CM

## 2017-05-02 DIAGNOSIS — E118 Type 2 diabetes mellitus with unspecified complications: Secondary | ICD-10-CM

## 2017-05-02 DIAGNOSIS — J9602 Acute respiratory failure with hypercapnia: Secondary | ICD-10-CM

## 2017-05-02 DIAGNOSIS — I5031 Acute diastolic (congestive) heart failure: Secondary | ICD-10-CM | POA: Diagnosis not present

## 2017-05-02 DIAGNOSIS — D696 Thrombocytopenia, unspecified: Secondary | ICD-10-CM

## 2017-05-02 DIAGNOSIS — R627 Adult failure to thrive: Secondary | ICD-10-CM

## 2017-05-02 DIAGNOSIS — I825Y2 Chronic embolism and thrombosis of unspecified deep veins of left proximal lower extremity: Secondary | ICD-10-CM | POA: Diagnosis not present

## 2017-05-02 DIAGNOSIS — N179 Acute kidney failure, unspecified: Secondary | ICD-10-CM

## 2017-05-02 DIAGNOSIS — N182 Chronic kidney disease, stage 2 (mild): Secondary | ICD-10-CM

## 2017-05-02 DIAGNOSIS — F0390 Unspecified dementia without behavioral disturbance: Secondary | ICD-10-CM

## 2017-05-02 DIAGNOSIS — J9601 Acute respiratory failure with hypoxia: Secondary | ICD-10-CM

## 2017-05-02 NOTE — Progress Notes (Signed)
: Provider:  Noah Delaine. Sheppard Coil, MD Location:  [facility] Room Number: [place]:  SNF [phone])  PCP: [name], DO Patient Care Team: [name], DO as PCP - General (Internal Medicine)  Extended Emergency Contact Information Primary Emergency Contact: Laureano,Chris Address: [address]          [place], [place] [address] Johnnette Litter of [place] Phone: [phone] Mobile Phone: [phone] Relation: Son Secondary Emergency Contact: Lauretta Chester Address: [place] [address] Johnnette Litter of [place] Phone: [phone] Mobile Phone: [phone] Relation: Spouse     Allergies: Patient has no known allergies.  Chief Complaint  Patient presents with  . New Admit To SNF    following hospitalization 04/25/17 to 2/70/62 acute diastolic (congestive) heart failure    HPI: Patient is 71 y.o. female with suspected dementia, hypertension, not treats head who presented [facility] emergency department with shortness of breath the past 3 weeks and progressive lower limb swelling. Over the last few days she been bedbound and unable to walk. Per husband is giving history she has severe anxiety with physicians and is worried that she'll be diagnosed with dementia; her husband has noted she's been very forgetful. She has no history of fever, chills, cough, sputum,. She was diagnosed with hypertension a while ago but refuses take her medications. In the emergency department she was found to have bilateral infiltrates/pulmonary edema and severe hyperglycemia and was placed on BiPAP. Patient was admitted to [facility] from 9/7-13 for acute on chronic congestive heart failure, acute kidney injury on chronic kidney disease stage II and a newly diagnosed left lower DVT which will be treated with Eliquis. Hospital course was further, located by finding of thrombocytopenia with a platelet nadir of 81, a new diagnosis  of diabetes with a hemoglobin A1c of 7.8, progressive dementia and confusion, and a left breast mass about 6 cm in diameter which she patient's husband is not interested in having biopsied. It was deemed that patient had been failing to thrive and skilled nursing facility placement was recommended. Since patient had not been to a doctor in the past 5 years there are no chronic problems to follow except for her hypertension which is now being treated which she would had been noncompliant with; otherwise all stated problems are new. Patient is admitted to skilled nursing facility with generalized weakness and failure to thrive up for OT/PT and to sort out all of patient's various and sundry new medical diagnoses.   Past Medical History:  Diagnosis Date  . Acute diastolic (congestive) heart failure ([place]) 04/25/2017  . Acute respiratory failure with hypoxia and hypercapnia ([place]) 04/25/2017  . ALLERGIC RHINITIS 07/01/2007   Qualifier: Diagnosis of  By: Sherren Mocha MD, Jory Ee   . Allergy   . Aortic insufficiency   . Aortic valve disorder 07/01/2007   Qualifier: Diagnosis of  By: Sherren Mocha MD, Dellis Filbert A   . Bilateral leg edema 04/25/2017  . Borderline diabetic   . CHF (congestive heart failure) ([place]) 04/25/2017  . Dementia 04/30/2017  . Diabetes mellitus ([place]) 04/25/2017  . DOE (dyspnea on exertion) 04/25/2017  . Essential hypertension 07/01/2007   Qualifier: Diagnosis of  By: Sherren Mocha MD, Jory Ee Glaucoma suspect   . Hypertension   . KNEE PAIN, LEFT 04/06/2010   Qualifier: Diagnosis of  By: Sherren Mocha MD, Jory Ee   . Obesity   .  Obesity, unspecified 07/01/2007   Qualifier: Diagnosis of  By: Sherren Mocha MD, Jory Ee   . Thyroid disease     History reviewed. No pertinent surgical history.  Allergies as of 05/02/2017   No Known Allergies     Medication List       Accurate as of 05/02/17 11:20 AM. Always use your most recent med list.          apixaban 5 MG Tabs tablet Commonly known as:  ELIQUIS Take 2 tabs po  BID the 9/18, then on 9/19 take 1 tab po BID   aspirin 81 MG EC tablet Take 1 tablet (81 mg total) by mouth daily.   carvedilol 3.125 MG tablet Commonly known as:  COREG Take 1 tablet (3.125 mg total) by mouth 2 (two) times daily with a meal.   furosemide 40 MG tablet Commonly known as:  LASIX Take 1 tablet (40 mg total) by mouth daily.   isosorbide mononitrate 30 MG 24 hr tablet Commonly known as:  IMDUR Take 0.5 tablets (15 mg total) by mouth daily.   lisinopril 2.5 MG tablet Commonly known as:  PRINIVIL,ZESTRIL Take 1 tablet (2.5 mg total) by mouth daily.   metFORMIN 500 MG 24 hr tablet Commonly known as:  GLUCOPHAGE-XR Take 1 tablet (500 mg total) by mouth 2 (two) times daily with a meal.       No orders of the defined types were placed in this encounter.   Immunization History  Administered Date(s) Administered  . Influenza Split 08/19/2011  . Influenza Whole 07/01/2007  . PPD Test 05/01/2017  . Pneumococcal Polysaccharide-23 02/17/2009, 12/23/2012, 04/29/2017  . Td 08/20/1999, 04/06/2010    Social History  Substance Use Topics  . Smoking status: Never Smoker  . Smokeless tobacco: Never Used  . Alcohol use No    Family history is   Family History  Problem Relation Age of Onset  . Alzheimer's disease Mother 54  . Alzheimer's disease Father 15  . Congestive Heart Failure Father   . Alcoholism Brother   . Alzheimer's disease Brother   . Dementia Brother   . Alcoholism Brother   . Diabetes Brother       Review of Systems  DATA OBTAINED: from Nurse GENERAL:  no fevers, fatigue, appetite changes SKIN: No itching, or rash EYES: No eye pain, redness, discharge EARS: No earache, tinnitus, change in hearing NOSE: No congestion, drainage or bleeding  MOUTH/THROAT: No mouth or tooth pain, No sore throat RESPIRATORY: No cough, wheezing, SOB CARDIAC: No chest pain, palpitations, lower extremity edema  GI: No abdominal pain, No N/V/D or constipation, No  heartburn or reflux  GU: No dysuria, frequency or urgency, or incontinence  MUSCULOSKELETAL: No unrelieved bone/joint pain NEUROLOGIC: No headache, dizziness or focal weakness PSYCHIATRIC: No c/o anxiety or sadness   Vitals:   05/02/17 1055  BP: 110/65  Pulse: 80  Resp: 18  Temp: 98.4 F (36.9 C)    SpO2 Readings from Last 1 Encounters:  05/01/17 98%   Body mass index is 33.01 kg/m.     Physical Exam  GENERAL APPEARANCE: Alert, conversant,  No acute distress.  SKIN: No diaphoresis rash HEAD: Normocephalic, atraumatic  EYES: Conjunctiva/lids clear. Pupils round, reactive. EOMs intact.  EARS: External exam WNL, canals clear. Hearing grossly normal.  NOSE: No deformity or discharge.  MOUTH/THROAT: Lips w/o lesions  RESPIRATORY: Breathing is even, unlabored. Lung sounds are clear   CARDIOVASCULAR: Heart RRR no murmurs, rubs or gallops. No peripheral edema.  GASTROINTESTINAL: Abdomen is soft, non-tender, not --------------------------------------------------------------------------------------------------distended w/ normal bowel sounds. GENITOURINARY: Bladder non tender, not distended  MUSCULOSKELETAL: No abnormal joints or musculature NEUROLOGIC:  Cranial nerves 2-12 grossly intact. Moves all extremities  PSYCHIATRIC: Mood and affect appropriate to situation, no behavioral issues  Patient Active Problem List   Diagnosis Date Noted  . Dementia 04/30/2017  . DOE (dyspnea on exertion) 04/25/2017  . Transient alteration of awareness 04/25/2017  . Bilateral leg edema 04/25/2017  . Acquired leg deformity 04/25/2017  . Thyroid disease 04/25/2017  . Noncompliance 04/25/2017  . Anxiety about health 04/25/2017  . Dehydration 04/25/2017  . CHF (congestive heart failure) ([place]) 04/25/2017  . Acute respiratory failure with hypoxia and hypercapnia ([place]) 04/25/2017  . Acute diastolic (congestive) heart failure ([place]) 04/25/2017  . Diabetes mellitus ([place]) 04/25/2017  . Hearing loss  secondary to cerumen impaction 12/23/2012  . KNEE PAIN, LEFT 04/06/2010  . Obesity, unspecified 07/01/2007  . Aortic valve disorder 07/01/2007  . ALLERGIC RHINITIS 07/01/2007  . Essential hypertension 07/01/2007      Labs reviewed: Basic Metabolic Panel:    Component Value Date/Time   NA 138 04/30/2017 0525   K 3.9 04/30/2017 0525   CL 96 (L) 04/30/2017 0525   CO2 33 (H) 04/30/2017 0525   GLUCOSE 133 (H) 04/30/2017 0525   BUN 21 (H) 04/30/2017 0525   CREATININE 0.89 04/30/2017 0525   CALCIUM 8.2 (L) 04/30/2017 0525   PROT 5.1 (L) 04/30/2017 0525   ALBUMIN 3.0 (L) 04/30/2017 0525   AST 29 04/30/2017 0525   ALT 28 04/30/2017 0525   ALKPHOS 45 04/30/2017 0525   BILITOT 1.3 (H) 04/30/2017 0525   GFRNONAA >60 04/30/2017 0525   GFRAA >60 04/30/2017 0525     Recent Labs  04/28/17 0218 04/29/17 0533 04/30/17 0525  NA 140 138 138  K 3.6 3.4* 3.9  CL 98* 96* 96*  CO2 32 35* 33*  GLUCOSE 68 149* 133*  BUN 22* 22* 21*  CREATININE 0.88 0.91 0.89  CALCIUM 8.0* 7.8* 8.2*  MG 1.8  --  2.0  PHOS 3.5  --   --    Liver Function Tests:  Recent Labs  04/25/17 1310 04/27/17 0337 04/30/17 0525  AST 33 30 29  ALT 42 34 28  ALKPHOS 63 46 45  BILITOT 1.8* 1.3* 1.3*  PROT 6.1* 4.7* 5.1*  ALBUMIN 3.9 2.8* 3.0*   No results for input(s): LIPASE, AMYLASE in the last 8760 hours.  Recent Labs  04/30/17 0525  AMMONIA 36*   CBC:  Recent Labs  04/28/17 0218 04/29/17 0533 04/30/17 0525  WBC 9.3 6.3 9.1  HGB 15.5* 14.3 16.7*  HCT 47.2* 44.1 49.8*  MCV 95.4 96.5 96.1  PLT 114* 102* 98*   Lipid No results for input(s): CHOL, HDL, LDLCALC, TRIG in the last 8760 hours.  Cardiac Enzymes:  Recent Labs  04/26/17 0943  TROPONINI 0.37*   BNP:  Recent Labs  04/25/17 1812 04/28/17 0219  BNP >4,500.0* 1,411.7*   Lab Results  Component Value Date   MICROALBUR 0.3 02/08/2009   Lab Results  Component Value Date   HGBA1C 7.8 (H) 04/26/2017   Lab Results    Component Value Date   TSH 2.447 04/26/2017   Lab Results  Component Value Date   VITAMINB12 338 04/30/2017   Lab Results  Component Value Date   FOLATE 12.0 04/30/2017   No results found for: IRON, TIBC, FERRITIN  Imaging and Procedures obtained prior to SNF admission: Dg Chest  Port 1 View  Result Date: 04/26/2017 CLINICAL DATA:  Short of breath EXAM: PORTABLE CHEST 1 VIEW COMPARISON:  04/25/2017 FINDINGS: Progression of diffuse bilateral airspace disease compatible with worsening pulmonary edema. Progression of left effusion and left lower lobe atelectasis. IMPRESSION: Worsening heart failure with increase in edema and left effusion. Progression of left lower lobe atelectasis. Electronically Signed   By: Franchot Gallo M.D.   On: 04/26/2017 08:11   Dg Chest Port 1 View  Result Date: 04/25/2017 CLINICAL DATA:  Altered mental status for several weeks, increased agitation. EXAM: PORTABLE CHEST 1 VIEW COMPARISON:  None. FINDINGS: Patchy bilateral airspace opacities, most prominent at the left lung base. Heart size cannot be characterized as margins are obscured by the overlying airspace opacities. Atherosclerotic changes noted at the aortic arch. No acute or suspicious osseous finding. IMPRESSION: 1. Patchy bilateral airspace opacities, most likely pulmonary edema. 2. Denser opacity at the left lung base could represent pneumonia, atelectasis and/or small pleural effusion. 3.  Aortic atherosclerosis. Electronically Signed   By: Franki Cabot M.D.   On: 04/25/2017 18:03     Not all labs, radiology exams or other studies done during hospitalization come through on my EPIC note; however they are reviewed by me.    Assessment and Plan  ACUTE HYPOXIC RESPIRATORY FAILURE/ACUTE DIASTOLIC CHF-EF 19-62%; required BiPAP intermittently; TTE-no vegetations, hypertension and probable etiology, she would not be candidate for aggressive treatment given her progressive dementia; patient started on oral  Lasix for discharge to skilled nursing SNF - continue O2 nasal cannula, wean as able; continue Coreg 3.125 mg by mouth twice a day, Lasix 40 mg daily Imdur 15 mg daily and lisinopril 2.5 mg by mouth daily  AK I on CK D2-is no infection; creatinine 1.200 mention, 0.89 on discharge SNF-will follow-up BMP  CHRONIC DVT LLE-we will to  anticoagulated for at least 3 months SNF - induction dose of Eliquis 2 mg twice a day through 9/18 then 5 mg by mouth twice a day starting on 9/19  THROMBOCYTOPENIA-platelet nadir was 81; discharge platelets 98 SNF -will follow-up CBC  DIABETES MELLITUS TYPE II-NEW DIAGNOSIS-hemoglobin A1c 7.8, glucose 428 on admission; patient was on sliding scale insulin at the hospital but her a.m. fasting blood sugar ranged from 68-195 in the last 3 days, therefore parable or started on Glucophage SNF - continue metformin 24-hour 500 mg by mouth twice a day; will check fasting blood sugars daily  PROGRESSIVE DEMENTIA/CONFUSION/FTT-per husband progressive decline for the last 2 years but patient was driving up to 4 weeks ago; CT head with chronic microvascular changes SNF-routinely for Namenda or Aricept; supportive care  LEFT BREAST MASS-6 cm in diameter and nontender no erythema; discussed with husband who is not interested in aggressive intervention and he is not interested in a biopsy SNF - this patient would be a good candidate for palliative care; will discuss with family   Time spent greater than 45 minutes;> 50% of time with patient was spent reviewing records, labs, tests and studies, counseling and developing plan of care  Webb Silversmith D. Sheppard Coil, MD

## 2017-05-04 ENCOUNTER — Encounter: Payer: Self-pay | Admitting: Internal Medicine

## 2017-05-04 DIAGNOSIS — R627 Adult failure to thrive: Secondary | ICD-10-CM | POA: Insufficient documentation

## 2017-05-04 DIAGNOSIS — I82502 Chronic embolism and thrombosis of unspecified deep veins of left lower extremity: Secondary | ICD-10-CM | POA: Insufficient documentation

## 2017-05-04 DIAGNOSIS — N6320 Unspecified lump in the left breast, unspecified quadrant: Secondary | ICD-10-CM | POA: Insufficient documentation

## 2017-05-04 DIAGNOSIS — N179 Acute kidney failure, unspecified: Secondary | ICD-10-CM | POA: Insufficient documentation

## 2017-05-04 DIAGNOSIS — D696 Thrombocytopenia, unspecified: Secondary | ICD-10-CM | POA: Insufficient documentation

## 2017-05-04 DIAGNOSIS — N182 Chronic kidney disease, stage 2 (mild): Secondary | ICD-10-CM

## 2017-05-05 ENCOUNTER — Encounter: Payer: Self-pay | Admitting: Neurology

## 2017-05-06 LAB — CBC AND DIFFERENTIAL
HEMATOCRIT: 46 (ref 36–46)
HEMOGLOBIN: 14.7 (ref 12.0–16.0)
PLATELETS: 161 (ref 150–399)
WBC: 4.7

## 2017-05-06 LAB — BASIC METABOLIC PANEL
BUN: 17 (ref 4–21)
Creatinine: 0.6 (ref 0.5–1.1)
GLUCOSE: 135
Potassium: 4.5 (ref 3.4–5.3)
Sodium: 142 (ref 137–147)

## 2017-05-07 ENCOUNTER — Ambulatory Visit (INDEPENDENT_AMBULATORY_CARE_PROVIDER_SITE_OTHER): Payer: Medicare Other | Admitting: Cardiovascular Disease

## 2017-05-07 ENCOUNTER — Encounter: Payer: Self-pay | Admitting: Cardiovascular Disease

## 2017-05-07 VITALS — BP 126/62 | HR 74 | Ht 60.0 in | Wt 169.0 lb

## 2017-05-07 DIAGNOSIS — F0390 Unspecified dementia without behavioral disturbance: Secondary | ICD-10-CM

## 2017-05-07 DIAGNOSIS — I420 Dilated cardiomyopathy: Secondary | ICD-10-CM

## 2017-05-07 DIAGNOSIS — D696 Thrombocytopenia, unspecified: Secondary | ICD-10-CM

## 2017-05-07 DIAGNOSIS — Z136 Encounter for screening for cardiovascular disorders: Secondary | ICD-10-CM | POA: Diagnosis not present

## 2017-05-07 DIAGNOSIS — Z7901 Long term (current) use of anticoagulants: Secondary | ICD-10-CM

## 2017-05-07 DIAGNOSIS — I509 Heart failure, unspecified: Secondary | ICD-10-CM | POA: Diagnosis not present

## 2017-05-07 DIAGNOSIS — I359 Nonrheumatic aortic valve disorder, unspecified: Secondary | ICD-10-CM | POA: Diagnosis not present

## 2017-05-07 DIAGNOSIS — I825Z2 Chronic embolism and thrombosis of unspecified deep veins of left distal lower extremity: Secondary | ICD-10-CM

## 2017-05-07 DIAGNOSIS — Z13220 Encounter for screening for lipoid disorders: Secondary | ICD-10-CM

## 2017-05-07 MED ORDER — FUROSEMIDE 40 MG PO TABS: 60.0000 mg | ORAL_TABLET | Freq: Every day | ORAL | Status: DC

## 2017-05-07 MED ORDER — LISINOPRIL 5 MG PO TABS: 5.0000 mg | ORAL_TABLET | Freq: Every day | ORAL | Status: DC

## 2017-05-07 NOTE — Progress Notes (Signed)
Cardiology Office Note    Date:  05/13/2017   ID:  Charlene Rubio, DOB 1946-04-02, MRN [id number]  PCP:  [name], DO  Cardiologist:  [name], MD   New cardiology consultation per request of Irwin Brakeman, M.D.  History of Present Illness:  Charlene Rubio is a 71 y.o. female who is referred through the courtesy of Dr. [name] in follow-up of her recent hospitalization.  Charlene Rubio has not seen a physician for over the past 5 years and has not been on any medications.  Over this time.  She has been having issues with memory and has suspected dementia.  She has a prior history of hypertension, but this has not been treated.  She was recently admitted to [facility] on 04/25/2017 after having at least a three-week history of progressive lower extremity edema and increasing shortness of breath.  She was felt initially to have acute hypoxic respiratory failure with pulmonary edema and CHF.  She required initial BiPAP support and aggressive diuretic therapy.  An echo Doppler study was done during her hospitalization which showed an EF of 25-30%.  There was grade 1 diastolic dysfunction.  There was moderate aortic insufficiency, mild mitral insufficiency, mild tricuspid insufficiency and there was mild left atrial dilatation.  There was mild pulmonary hypertension.  A venous Doppler study did suggest a chronic DVT in the left lower extremity.  She was started on eliquis per pharmacy and support stockings.  She was significantly hyperglycemic on admission with a glucose of 428 and a hemoglobin A1c elevated at 7.8.  She was also found to have a non-tender breast mass.  She was discharged on 05/01/2017 on eloquence 5 mg twice a day, carvedilol 3.125 mg twice a day, furosemide 40 mg daily, isosorbide 15 mg, lisinopril 2.5 mg, and metformin 500 mg twice a day.  She is now referred for cardiology evaluation.  Presently, she denies chest pain.  She continues to have lower extremity edema.  She  is unaware of palpitations.  She denies presyncope or syncope.   Past Medical History:  Diagnosis Date  . Acute diastolic (congestive) heart failure ([place]) 04/25/2017  . Acute respiratory failure with hypoxia and hypercapnia ([place]) 04/25/2017  . ALLERGIC RHINITIS 07/01/2007   Qualifier: Diagnosis of  By: Sherren Mocha MD, Jory Ee   . Allergy   . Aortic insufficiency   . Aortic valve disorder 07/01/2007   Qualifier: Diagnosis of  By: Sherren Mocha MD, Dellis Filbert A   . Bilateral leg edema 04/25/2017  . Borderline diabetic   . CHF (congestive heart failure) ([place]) 04/25/2017  . Dementia 04/30/2017  . Diabetes mellitus ([place]) 04/25/2017  . DOE (dyspnea on exertion) 04/25/2017  . Essential hypertension 07/01/2007   Qualifier: Diagnosis of  By: Sherren Mocha MD, Jory Ee Glaucoma suspect   . Hypertension   . KNEE PAIN, LEFT 04/06/2010   Qualifier: Diagnosis of  By: Sherren Mocha MD, Jory Ee   . Obesity   . Obesity, unspecified 07/01/2007   Qualifier: Diagnosis of  By: Sherren Mocha MD, Jory Ee   . Thyroid disease     History reviewed. No pertinent surgical history.  Current Medications: Outpatient Medications Prior to Visit  Medication Sig Dispense Refill  . apixaban (ELIQUIS) 5 MG TABS tablet Take 2 tabs po BID the 9/18, then on 9/19 take 1 tab po BID 60 tablet   . aspirin EC 81 MG EC tablet Take 1 tablet (81 mg total) by mouth daily.    Marland Kitchen  carvedilol (COREG) 3.125 MG tablet Take 1 tablet (3.125 mg total) by mouth 2 (two) times daily with a meal.    . isosorbide mononitrate (IMDUR) 30 MG 24 hr tablet Take 0.5 tablets (15 mg total) by mouth daily.    . metFORMIN (GLUCOPHAGE-XR) 500 MG 24 hr tablet Take 1 tablet (500 mg total) by mouth 2 (two) times daily with a meal.    . furosemide (LASIX) 40 MG tablet Take 1 tablet (40 mg total) by mouth daily. 30 tablet   . lisinopril (PRINIVIL,ZESTRIL) 2.5 MG tablet Take 1 tablet (2.5 mg total) by mouth daily.     No facility-administered medications prior to visit.      Allergies:   Patient  has no known allergies.   Social History   Social History  . Marital status: Married    Spouse name: N/A  . Number of children: N/A  . Years of education: N/A   Occupational History  . retired Building control surveyor    Social History Main Topics  . Smoking status: Never Smoker  . Smokeless tobacco: Never Used  . Alcohol use No  . Drug use: No  . Sexual activity: Not Currently   Other Topics Concern  . None   Social History Narrative   Social History   Diet?    Do you drink/eat things with caffeine? Yes (minimal) coke   Marital status?          married                          What year were you married? 1968   Do you live in a house, apartment, assisted living, condo, trailer, etc.? house   Is it one or more stories? one   How many persons live in your home? 2   Do you have any pets in your home? (please list) none   Highest level of education completed?   Current or past profession: preschool teacher/nanny   Do you exercise?         0                             Type & how often?   Advanced Directives   Do you have a living will?   Do you have a DNR form?                                  If not, do you want to discuss one?   Do you have signed POA/HPOA for forms?    Functional Status   Do you have difficulty bathing or dressing yourself?   Do you have difficulty preparing food or eating?    Do you have difficulty managing your medications?   Do you have difficulty managing your finances?   Do you have difficulty affording your medications?    Admitted to [facility] 05/01/17   Never smoked   Alcohol none   DNR          Additional social history is notable that she is married for 50 years.  She has 3 children, 6 grandchildren.  There is no history of tobacco or alcohol use.  She does not exercise.  Family History:  The patient's family history includes Alcoholism in her brother and brother; Alzheimer's disease in her brother; Alzheimer's disease (age of onset: 109)  in  her father and mother; Congestive Heart Failure in her father; Dementia in her brother; Diabetes in her brother.   ROS General: Negative; No fevers, chills, or night sweats;  HEENT: Negative; No changes in vision or hearing, sinus congestion, difficulty swallowing Pulmonary: Shortness of breath with activity Cardiovascular: See history of present illness GI: Negative; No nausea, vomiting, diarrhea, or abdominal pain GU: Negative; No dysuria, hematuria, or difficulty voiding Musculoskeletal: Negative; no myalgias, joint pain, or weakness Hematologic/Oncology: Chronic DVT;  breast mass Endocrine: Negative; no heat/cold intolerance; no diabetes Neuro: Suspected dementia Skin: Negative; No rashes or skin lesions Psychiatric: Negative; No behavioral problems, depression Sleep: Negative; No snoring, daytime sleepiness, hypersomnolence, bruxism, restless legs, hypnogognic hallucinations, no cataplexy Other comprehensive 14 point system review is negative.   PHYSICAL EXAM:   VS:  BP 126/62   Pulse 74   Ht 5' (1.524 m)   Wt 169 lb (76.7 kg)   LMP  (LMP Unknown)   BMI 33.01 kg/m     Repeat blood pressure by me was 124/62 Wt Readings from Last 3 Encounters:  05/07/17 169 lb (76.7 kg)  05/02/17 169 lb (76.7 kg)  05/01/17 158 lb 3.2 oz (71.8 kg)    General: Alert, oriented, no distress.  Skin: normal turgor, no rashes, warm and dry HEENT: Normocephalic, atraumatic. Pupils equal round and reactive to light; sclera anicteric; extraocular muscles intact; Fundi No hemorrhages or exudates.  Disks flat Nose without nasal septal hypertrophy Mouth/Parynx benign; Mallinpatti scale 3 Neck: No JVD, no carotid bruits; normal carotid upstroke Lungs: Slightly decreased breath sounds at bases.; no wheezing or rales Chest wall: without tenderness to palpitation Heart: PMI not displaced, RRR, s1 s2 normal, 1/6 systolic murmur, 1/6 aortic diastolic murmur, no rubs, gallops, thrills, or heaves Abdomen:  soft, nontender; no hepatosplenomehaly, BS+; abdominal aorta nontender and not dilated by palpation. Back: no CVA tenderness Pulses 2+ Musculoskeletal: full range of motion, normal strength, no joint deformities Extremities: 2+ pitting edema in the left lower extremity 1+ on the right.  The edema is more tense on the left.  no clubbing cyanosis, Homan's sign negative  Neurologic: grossly nonfocal; Cranial nerves grossly wnl Psychologic: Flat affect   Studies/Labs Reviewed:   EKG:  EKG is ordered today.  ECG (independently read by me): normal sinus rhythm at 74 bpm.  Borderline LVH.  Nonspecific ST-T changes.  Baseline artifact.   Recent Labs: BMP Latest Ref Rng & Units 04/30/2017 04/29/2017 04/28/2017  Glucose 65 - 99 mg/dL 133(H) 149(H) 68  BUN 6 - 20 mg/dL 21(H) 22(H) 22(H)  Creatinine 0.44 - 1.00 mg/dL 0.89 0.91 0.88  Sodium 135 - 145 mmol/L 138 138 140  Potassium 3.5 - 5.1 mmol/L 3.9 3.4(L) 3.6  Chloride 101 - 111 mmol/L 96(L) 96(L) 98(L)  CO2 22 - 32 mmol/L 33(H) 35(H) 32  Calcium 8.9 - 10.3 mg/dL 8.2(L) 7.8(L) 8.0(L)     Hepatic Function Latest Ref Rng & Units 04/30/2017 04/27/2017 04/25/2017  Total Protein 6.5 - 8.1 g/dL 5.1(L) 4.7(L) 6.1(L)  Albumin 3.5 - 5.0 g/dL 3.0(L) 2.8(L) 3.9  AST 15 - 41 U/L 29 30 33  ALT 14 - 54 U/L 28 34 42  Alk Phosphatase 38 - 126 U/L 45 46 63  Total Bilirubin 0.3 - 1.2 mg/dL 1.3(H) 1.3(H) 1.8(H)  Bilirubin, Direct 0.1 - 0.5 mg/dL 0.3 - -    CBC Latest Ref Rng & Units 04/30/2017 04/29/2017 04/28/2017  WBC 4.0 - 10.5 K/uL 9.1 6.3 9.3  Hemoglobin 12.0 -  15.0 g/dL 16.7(H) 14.3 15.5(H)  Hematocrit 36.0 - 46.0 % 49.8(H) 44.1 47.2(H)  Platelets 150 - 400 K/uL 98(L) 102(L) 114(L)   Lab Results  Component Value Date   MCV 96.1 04/30/2017   MCV 96.5 04/29/2017   MCV 95.4 04/28/2017   Lab Results  Component Value Date   TSH 2.447 04/26/2017   Lab Results  Component Value Date   HGBA1C 7.8 (H) 04/26/2017     BNP    Component Value Date/Time     BNP 1,411.7 (H) 04/28/2017 0219    ProBNP No results found for: PROBNP   Lipid Panel     Component Value Date/Time   CHOL 193 08/19/2011 1038   TRIG 127.0 08/19/2011 1038   HDL 49.00 08/19/2011 1038   CHOLHDL 4 08/19/2011 1038   VLDL 25.4 08/19/2011 1038   LDLCALC 119 (H) 08/19/2011 1038     RADIOLOGY: Ct Head Wo Contrast  Result Date: 04/29/2017 CLINICAL DATA:  71 y/o  F; increase forgetfulness and lethargy. EXAM: CT HEAD WITHOUT CONTRAST TECHNIQUE: Contiguous axial images were obtained from the base of the skull through the vertex without intravenous contrast. COMPARISON:  None. FINDINGS: Brain: No evidence of acute infarction, hemorrhage, hydrocephalus, extra-axial collection or mass lesion/mass effect. Moderate chronic microvascular ischemic changes of white matter parenchymal volume loss of the brain. Small chronic lacunar infarcts within the cerebellar hemispheres bilaterally, the left thalamus, and the right caudate head. Vascular: Extensive calcific atherosclerosis of carotid siphons. Skull: Normal. Negative for fracture or focal lesion. Sinuses/Orbits: No acute finding. Other: None. IMPRESSION: 1. No acute intracranial abnormality. 2. Moderate chronic microvascular ischemic changes and parenchymal volume loss of the brain. Small chronic lacunar infarcts in cerebellum and basal ganglia. Electronically Signed   By: Kristine Garbe M.D.   On: 04/29/2017 22:45   Dg Chest Port 1 View  Result Date: 04/28/2017 CLINICAL DATA:  Shortness of breath, acute respiratory failure EXAM: PORTABLE CHEST 1 VIEW COMPARISON:  04/27/2017 FINDINGS: Cardiomegaly with mild interstitial edema. Suspected trace right and small left pleural effusions. No pneumothorax. IMPRESSION: Cardiomegaly with mild interstitial edema, unchanged. Bilateral pleural effusions, left greater than right. Electronically Signed   By: Julian Hy M.D.   On: 04/28/2017 07:20   Dg Chest Port 1 View  Result Date:  04/27/2017 CLINICAL DATA:  Respiratory failure EXAM: PORTABLE CHEST 1 VIEW COMPARISON:  92,018 FINDINGS: Bilateral airspace disease unchanged. Left lower lobe atelectasis and effusion unchanged. Small right effusion. IMPRESSION: No significant interval change. Probable heart failure with pulmonary edema and pleural effusions. Electronically Signed   By: Franchot Gallo M.D.   On: 04/27/2017 07:19   Dg Chest Port 1 View  Result Date: 04/26/2017 CLINICAL DATA:  Short of breath EXAM: PORTABLE CHEST 1 VIEW COMPARISON:  04/25/2017 FINDINGS: Progression of diffuse bilateral airspace disease compatible with worsening pulmonary edema. Progression of left effusion and left lower lobe atelectasis. IMPRESSION: Worsening heart failure with increase in edema and left effusion. Progression of left lower lobe atelectasis. Electronically Signed   By: Franchot Gallo M.D.   On: 04/26/2017 08:11   Dg Chest Port 1 View  Result Date: 04/25/2017 CLINICAL DATA:  Altered mental status for several weeks, increased agitation. EXAM: PORTABLE CHEST 1 VIEW COMPARISON:  None. FINDINGS: Patchy bilateral airspace opacities, most prominent at the left lung base. Heart size cannot be characterized as margins are obscured by the overlying airspace opacities. Atherosclerotic changes noted at the aortic arch. No acute or suspicious osseous finding. IMPRESSION: 1. Patchy bilateral airspace  opacities, most likely pulmonary edema. 2. Denser opacity at the left lung base could represent pneumonia, atelectasis and/or small pleural effusion. 3.  Aortic atherosclerosis. Electronically Signed   By: Franki Cabot M.D.   On: 04/25/2017 18:03     Additional studies/ records that were reviewed today include:  I reviewed the patient's recent hospitalization records.  ------------------------------------------------------------------- 04/26/2017 Echo Study Conclusions  - Left ventricle: The cavity size was mildly dilated. There was   mild concentric  hypertrophy. Systolic function was severely   reduced. The estimated ejection fraction was in the range of 25%   to 30%. Wall motion was normal; there were no regional wall   motion abnormalities. Doppler parameters are consistent with   abnormal left ventricular relaxation (grade 1 diastolic   dysfunction). - Aortic valve: There was moderate regurgitation. - Mitral valve: There was mild regurgitation. - Left atrium: The atrium was mildly dilated. - Tricuspid valve: There was mild regurgitation. - Pulmonary arteries: Systolic pressure was mildly increased. PA   peak pressure: 36 mm Hg (S). 04/26/2017 echo  ------------------------------------------------------------------- 04/30/2017 lower extremity venous Doppler Summary:  - No evidence of deep vein thrombosis involving the visualized   veins of the right lower extremity. - Findings consistent with chronic deep vein thrombosis involving   the left distal femoral, popliteal, posterior tibial and peroneal   veins of the left lower extremity. - Incidental findings are consistent with: a very small Baker&'s   Cyst on the right.  ASSESSMENT:    1. Congestive heart failure, unspecified HF chronicity, unspecified heart failure type ([place])   2. Aortic valve disorder   3. Encounter for lipid screening for cardiovascular disease   4. Dilated cardiomyopathy ([place])   5. 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 Anticoagulation adequate   7. Dementia without behavioral disturbance, unspecified dementia type   8. Thrombocytopenia [facility])      PLAN:  Charlene Rubio is a she has been felt to have progressive suspected dementia.  She had recently developed several week history of lower extremity edema and increasing shortness of breath.  She was found to have severe LV dysfunction with an EF of 25-30% with moderate AR, mild MR and TR, and mild pulmonary hypertension.  She was felt to have acute  combined systolic and diastolic heart failure.  She required initial BiPAP therapy was treated with diuretic therapy.  Because of chronic DVT.  She was started on anticoagulation with eliquis.  She is tolerating this without bleeding.  On exam today, she continues to have lower extremity edema.  Her ECG shows sinus rhythm without ectopy.  I am recommending increasing her furosemide to 40 mg twice a day for the next 3 days and then she will change to 60 mg daily.  We discussed the importance of sodium restriction.  With her cardiomyopathy I am  increasing lisinopril to 5 mg daily.  I have recommended support stockings.  With her LV dysfunction in several weeks I am recommending that she undergo a [place] study to evaluate for potential ischemic etiology to her LV dysfunction.  During her hospitalization she was diagnosed with diabetes and is now on metformin.  She also was noted to have a left breast mass and this will need to be followed up by her primary physician since initially she did not undergo biopsy.  In several weeks she will undergo follow-up Cmet, BNP, TSH, CBC, and lipid studies.  Laboratory and during  her hospitalization demonstrated thrombocytopenia.  I will see her in 6 weeks for reevaluation.   Medication Adjustments/Labs and Tests Ordered: Current medicines are reviewed at length with the patient today.  Concerns regarding medicines are outlined above.  Medication changes, Labs and Tests ordered today are listed in the Patient Instructions below. Patient Instructions  Medication Instructions:  INCREASE Lisinopril to 79m daily  INCREASE Lasix to 486m(1 tablet) two times daily x 3 days, then resume at 6074m1.5 tablet) daily  Labwork: Please return for FASTING labs (CMET, CBC, BNP, TSH, Lipids)-This can be done at our in office lab (M-F 8:30-4:30, closed for lunch 12:30-2, no appointment needed)  Testing/Procedures: Your physician has requested that you have a lexiscan  myoview. For further information please visit [email or web address] follow instruction sheet, as given.   Follow-Up: Your physician recommends that you schedule a follow-up appointment in: 6 weeks with Dr. KelClaiborne BillingsAny Other Special Instructions Will Be Listed Below (If Applicable).   DASH Eating Plan DASH stands for "Dietary Approaches to Stop Hypertension." The DASH eating plan is a healthy eating plan that has been shown to reduce high blood pressure (hypertension). It may also reduce your risk for type 2 diabetes, heart disease, and stroke. The DASH eating plan may also help with weight loss. What are tips for following this plan? General guidelines  Avoid eating more than 2,300 mg (milligrams) of salt (sodium) a day. If you have hypertension, you may need to reduce your sodium intake to 1,500 mg a day.  Limit alcohol intake to no more than 1 drink a day for nonpregnant women and 2 drinks a day for men. One drink equals 12 oz of beer, 5 oz of wine, or 1 oz of hard liquor.  Work with your health care provider to maintain a healthy body weight or to lose weight. Ask what an ideal weight is for you.  Get at least 30 minutes of exercise that causes your heart to beat faster (aerobic exercise) most days of the week. Activities may include walking, swimming, or biking.  Work with your health care provider or diet and nutrition specialist (dietitian) to adjust your eating plan to your individual calorie needs. Reading food labels  Check food labels for the amount of sodium per serving. Choose foods with less than 5 percent of the Daily Value of sodium. Generally, foods with less than 300 mg of sodium per serving fit into this eating plan.  To find whole grains, look for the word "whole" as the first word in the ingredient list. Shopping  Buy products labeled as "low-sodium" or "no salt added."  Buy fresh foods. Avoid canned foods and premade or frozen meals. Cooking  Avoid  adding salt when cooking. Use salt-free seasonings or herbs instead of table salt or sea salt. Check with your health care provider or pharmacist before using salt substitutes.  Do not fry foods. Cook foods using healthy methods such as baking, boiling, grilling, and broiling instead.  Cook with heart-healthy oils, such as olive, canola, soybean, or sunflower oil. Meal planning   Eat a balanced diet that includes: ? 5 or more servings of fruits and vegetables each day. At each meal, try to fill half of your plate with fruits and vegetables. ? Up to 6-8 servings of whole grains each day. ? Less than 6 oz of lean meat, poultry, or fish each day. A 3-oz serving of meat is about the same size as a deck of cards. One  egg equals 1 oz. ? 2 servings of low-fat dairy each day. ? A serving of nuts, seeds, or beans 5 times each week. ? Heart-healthy fats. Healthy fats called Omega-3 fatty acids are found in foods such as flaxseeds and coldwater fish, like sardines, salmon, and mackerel.  Limit how much you eat of the following: ? Canned or prepackaged foods. ? Food that is high in trans fat, such as fried foods. ? Food that is high in saturated fat, such as fatty meat. ? Sweets, desserts, sugary drinks, and other foods with added sugar. ? Full-fat dairy products.  Do not salt foods before eating.  Try to eat at least 2 vegetarian meals each week.  Eat more home-cooked food and less restaurant, buffet, and fast food.  When eating at a restaurant, ask that your food be prepared with less salt or no salt, if possible. What foods are recommended? The items listed may not be a complete list. Talk with your dietitian about what dietary choices are best for you. Grains Whole-grain or whole-wheat bread. Whole-grain or whole-wheat pasta. Brown rice. [place]row. Bulgur. Whole-grain and low-sodium cereals. Pita bread. Low-fat, low-sodium crackers. Whole-wheat flour tortillas. Vegetables Fresh or  frozen vegetables (raw, steamed, roasted, or grilled). Low-sodium or reduced-sodium tomato and vegetable juice. Low-sodium or reduced-sodium tomato sauce and tomato paste. Low-sodium or reduced-sodium canned vegetables. Fruits All fresh, dried, or frozen fruit. Canned fruit in natural juice (without added sugar). Meat and other protein foods Skinless chicken or [place]. Ground chicken or [place]. Pork with fat trimmed off. Fish and seafood. Egg whites. Dried beans, peas, or lentils. Unsalted nuts, nut butters, and seeds. Unsalted canned beans. Lean cuts of beef with fat trimmed off. Low-sodium, lean deli meat. Dairy Low-fat (1%) or fat-free (skim) milk. Fat-free, low-fat, or reduced-fat cheeses. Nonfat, low-sodium ricotta or cottage cheese. Low-fat or nonfat yogurt. Low-fat, low-sodium cheese. Fats and oils Soft margarine without trans fats. Vegetable oil. Low-fat, reduced-fat, or light mayonnaise and salad dressings (reduced-sodium). Canola, safflower, olive, soybean, and sunflower oils. Avocado. Seasoning and other foods Herbs. Spices. Seasoning mixes without salt. Unsalted popcorn and pretzels. Fat-free sweets. What foods are not recommended? The items listed may not be a complete list. Talk with your dietitian about what dietary choices are best for you. Grains Baked goods made with fat, such as croissants, muffins, or some breads. Dry pasta or rice meal packs. Vegetables Creamed or fried vegetables. Vegetables in a cheese sauce. Regular canned vegetables (not low-sodium or reduced-sodium). Regular canned tomato sauce and paste (not low-sodium or reduced-sodium). Regular tomato and vegetable juice (not low-sodium or reduced-sodium). [name]. Olives. Fruits Canned fruit in a light or heavy syrup. Fried fruit. Fruit in cream or butter sauce. Meat and other protein foods Fatty cuts of meat. Ribs. Fried meat. Berniece Salines. Sausage. Bologna and other processed lunch meats. Salami. Fatback. Hotdogs.  Bratwurst. Salted nuts and seeds. Canned beans with added salt. Canned or smoked fish. Whole eggs or egg yolks. Chicken or [place] with skin. Dairy Whole or 2% milk, cream, and half-and-half. Whole or full-fat cream cheese. Whole-fat or sweetened yogurt. Full-fat cheese. Nondairy creamers. Whipped toppings. Processed cheese and cheese spreads. Fats and oils Butter. Stick margarine. Lard. Shortening. Ghee. Bacon fat. Tropical oils, such as coconut, palm kernel, or palm oil. Seasoning and other foods Salted popcorn and pretzels. Onion salt, garlic salt, seasoned salt, table salt, and sea salt. Worcestershire sauce. Tartar sauce. Barbecue sauce. Teriyaki sauce. Soy sauce, including reduced-sodium. Steak sauce. Canned and packaged gravies. Fish  sauce. Oyster sauce. Cocktail sauce. Horseradish that you find on the shelf. Ketchup. Mustard. Meat flavorings and tenderizers. Bouillon cubes. Hot sauce and Tabasco sauce. Premade or packaged marinades. Premade or packaged taco seasonings. Relishes. Regular salad dressings. Where to find more information:  National Heart, Lung, and [place]: https://wilson-eaton.com/  American Heart Association: www.heart.org Summary  The DASH eating plan is a healthy eating plan that has been shown to reduce high blood pressure (hypertension). It may also reduce your risk for type 2 diabetes, heart disease, and stroke.  With the DASH eating plan, you should limit salt (sodium) intake to 2,300 mg a day. If you have hypertension, you may need to reduce your sodium intake to 1,500 mg a day.  When on the DASH eating plan, aim to eat more fresh fruits and vegetables, whole grains, lean proteins, low-fat dairy, and heart-healthy fats.  Work with your health care provider or diet and nutrition specialist (dietitian) to adjust your eating plan to your individual calorie needs. This information is not intended to replace advice given to you by your health care provider. Make sure you  discuss any questions you have with your health care provider. Document Released: 07/25/2011 Document Revised: 07/29/2016 Document Reviewed: 07/29/2016 Elsevier Interactive Patient Education  2017 [place].     How to Use Compression Stockings Compression stockings are elastic socks that squeeze the legs. They help to increase blood flow to the legs, decrease swelling in the legs, and reduce the chance of developing blood clots in the lower legs. Compression stockings are often used by people who:  Are recovering from surgery.  Have poor circulation in their legs.  Are prone to getting blood clots in their legs.  Have varicose veins.  Sit or stay in bed for long periods of time.  How to use compression stockings Before you put on your compression stockings:  Make sure that they are the correct size. If you do not know your size, ask your health care provider.  Make sure that they are clean, dry, and in good condition.  Check them for rips and tears. Do not put them on if they are ripped or torn.  Put your stockings on first thing in the morning, before you get out of bed. Keep them on for as long as your health care provider advises. When you are wearing your stockings:  Keep them as smooth as possible. Do not allow them to bunch up. It is especially important to prevent the stockings from bunching up around your toes or behind your knees.  Do not roll the stockings downward and leave them rolled down. This can decrease blood flow to your leg.  Change them right away if they become wet or dirty. 1.   When you take off your stockings, inspect your legs and feet. Anything that does not seem normal may require medical attention. Look for:  Open sores.  Red spots.  Swelling.  Information and tips  Do not stop wearing your compression stockings without talking to your health care provider first.  Wash your stockings every day with mild detergent in cold or warm  water. Do not use bleach. Air-dry your stockings or dry them in a clothes dryer on low heat.  Replace your stockings every 3-6 months.  If skin moisturizing is part of your treatment plan, apply lotion or cream at night so that your skin will be dry when you put on the stockings in the morning. It is harder to put the  stockings on when you have lotion on your legs or feet. Contact a health care provider if: Remove your stockings and seek medical care if:  You have a feeling of pins and needles in your feet or legs.  You have any new changes in your skin.  You have skin lesions that are getting worse.  You have swelling or pain that is getting worse.  Get help right away if:  You have numbness or tingling in your lower legs that does not get better right after you take the stockings off.  Your toes or feet become cold and blue.  You develop open sores or red spots on your legs that do not go away.  You see or feel a warm spot on your leg.  You have new swelling or soreness in your leg.  You are short of breath or you have chest pain for no reason.  You have a rapid or irregular heartbeat.  You feel light-headed or dizzy. This information is not intended to replace advice given to you by your health care provider. Make sure you discuss any questions you have with your health care provider. Document Released: 06/02/2009 Document Revised: 01/03/2016 Document Reviewed: 07/13/2014 Elsevier Interactive Patient Education  [employer].  If you need a refill on your cardiac medications before your next appointment, please call your pharmacy.      Signed, [name], MD  05/13/2017 8:51 AM    [facility] [address] [place], [place], [place]  [address] Phone: [phone]

## 2017-05-07 NOTE — Patient Instructions (Signed)
Medication Instructions:  INCREASE Lisinopril to 5mg  daily  INCREASE Lasix to 40mg  (1 tablet) two times daily x 3 days, then resume at 60mg  (1.5 tablet) daily  Labwork: Please return for FASTING labs (CMET, CBC, BNP, TSH, Lipids)-This can be done at our in office lab (M-F 8:30-4:30, closed for lunch 12:30-2, no appointment needed)  Testing/Procedures: Your physician has requested that you have a lexiscan myoview. For further information please visit [email or web address]. Please follow instruction sheet, as given.   Follow-Up: Your physician recommends that you schedule a follow-up appointment in: 6 weeks with Dr. Claiborne Billings   Any Other Special Instructions Will Be Listed Below (If Applicable).   DASH Eating Plan DASH stands for "Dietary Approaches to Stop Hypertension." The DASH eating plan is a healthy eating plan that has been shown to reduce high blood pressure (hypertension). It may also reduce your risk for type 2 diabetes, heart disease, and stroke. The DASH eating plan may also help with weight loss. What are tips for following this plan? General guidelines  Avoid eating more than 2,300 mg (milligrams) of salt (sodium) a day. If you have hypertension, you may need to reduce your sodium intake to 1,500 mg a day.  Limit alcohol intake to no more than 1 drink a day for nonpregnant women and 2 drinks a day for men. One drink equals 12 oz of beer, 5 oz of wine, or 1 oz of hard liquor.  Work with your health care provider to maintain a healthy body weight or to lose weight. Ask what an ideal weight is for you.  Get at least 30 minutes of exercise that causes your heart to beat faster (aerobic exercise) most days of the week. Activities may include walking, swimming, or biking.  Work with your health care provider or diet and nutrition specialist (dietitian) to adjust your eating plan to your individual calorie needs. Reading food labels  Check food labels for the amount of sodium per  serving. Choose foods with less than 5 percent of the Daily Value of sodium. Generally, foods with less than 300 mg of sodium per serving fit into this eating plan.  To find whole grains, look for the word "whole" as the first word in the ingredient list. Shopping  Buy products labeled as "low-sodium" or "no salt added."  Buy fresh foods. Avoid canned foods and premade or frozen meals. Cooking  Avoid adding salt when cooking. Use salt-free seasonings or herbs instead of table salt or sea salt. Check with your health care provider or pharmacist before using salt substitutes.  Do not fry foods. Cook foods using healthy methods such as baking, boiling, grilling, and broiling instead.  Cook with heart-healthy oils, such as olive, canola, soybean, or sunflower oil. Meal planning   Eat a balanced diet that includes: ? 5 or more servings of fruits and vegetables each day. At each meal, try to fill half of your plate with fruits and vegetables. ? Up to 6-8 servings of whole grains each day. ? Less than 6 oz of lean meat, poultry, or fish each day. A 3-oz serving of meat is about the same size as a deck of cards. One egg equals 1 oz. ? 2 servings of low-fat dairy each day. ? A serving of nuts, seeds, or beans 5 times each week. ? Heart-healthy fats. Healthy fats called Omega-3 fatty acids are found in foods such as flaxseeds and coldwater fish, like sardines, salmon, and mackerel.  Limit how much you eat of the following: ?  Canned or prepackaged foods. ? Food that is high in trans fat, such as fried foods. ? Food that is high in saturated fat, such as fatty meat. ? Sweets, desserts, sugary drinks, and other foods with added sugar. ? Full-fat dairy products.  Do not salt foods before eating.  Try to eat at least 2 vegetarian meals each week.  Eat more home-cooked food and less restaurant, buffet, and fast food.  When eating at a restaurant, ask that your food be prepared with less salt  or no salt, if possible. What foods are recommended? The items listed may not be a complete list. Talk with your dietitian about what dietary choices are best for you. Grains Whole-grain or whole-wheat bread. Whole-grain or whole-wheat pasta. Brown rice. [place]. Bulgur. Whole-grain and low-sodium cereals. Pita bread. Low-fat, low-sodium crackers. Whole-wheat flour tortillas. Vegetables Fresh or frozen vegetables (raw, steamed, roasted, or grilled). Low-sodium or reduced-sodium tomato and vegetable juice. Low-sodium or reduced-sodium tomato sauce and tomato paste. Low-sodium or reduced-sodium canned vegetables. Fruits All fresh, dried, or frozen fruit. Canned fruit in natural juice (without added sugar). Meat and other protein foods Skinless chicken or [place]. Ground chicken or [place]. Pork with fat trimmed off. Fish and seafood. Egg whites. Dried beans, peas, or lentils. Unsalted nuts, nut butters, and seeds. Unsalted canned beans. Lean cuts of beef with fat trimmed off. Low-sodium, lean deli meat. Dairy Low-fat (1%) or fat-free (skim) milk. Fat-free, low-fat, or reduced-fat cheeses. Nonfat, low-sodium ricotta or cottage cheese. Low-fat or nonfat yogurt. Low-fat, low-sodium cheese. Fats and oils Soft margarine without trans fats. Vegetable oil. Low-fat, reduced-fat, or light mayonnaise and salad dressings (reduced-sodium). Canola, safflower, olive, soybean, and sunflower oils. Avocado. Seasoning and other foods Herbs. Spices. Seasoning mixes without salt. Unsalted popcorn and pretzels. Fat-free sweets. What foods are not recommended? The items listed may not be a complete list. Talk with your dietitian about what dietary choices are best for you. Grains Baked goods made with fat, such as croissants, muffins, or some breads. Dry pasta or rice meal packs. Vegetables Creamed or fried vegetables. Vegetables in a cheese sauce. Regular canned vegetables (not low-sodium or reduced-sodium).  Regular canned tomato sauce and paste (not low-sodium or reduced-sodium). Regular tomato and vegetable juice (not low-sodium or reduced-sodium). Charlene Rubio. Olives. Fruits Canned fruit in a light or heavy syrup. Fried fruit. Fruit in cream or butter sauce. Meat and other protein foods Fatty cuts of meat. Ribs. Fried meat. Berniece Salines. Sausage. Bologna and other processed lunch meats. Salami. Fatback. Hotdogs. Bratwurst. Salted nuts and seeds. Canned beans with added salt. Canned or smoked fish. Whole eggs or egg yolks. Chicken or [place] with skin. Dairy Whole or 2% milk, cream, and half-and-half. Whole or full-fat cream cheese. Whole-fat or sweetened yogurt. Full-fat cheese. Nondairy creamers. Whipped toppings. Processed cheese and cheese spreads. Fats and oils Butter. Stick margarine. Lard. Shortening. Ghee. Bacon fat. Tropical oils, such as coconut, palm kernel, or palm oil. Seasoning and other foods Salted popcorn and pretzels. Onion salt, garlic salt, seasoned salt, table salt, and sea salt. Worcestershire sauce. Tartar sauce. Barbecue sauce. Teriyaki sauce. Soy sauce, including reduced-sodium. Steak sauce. Canned and packaged gravies. Fish sauce. Oyster sauce. Cocktail sauce. Horseradish that you find on the shelf. Ketchup. Mustard. Meat flavorings and tenderizers. Bouillon cubes. Hot sauce and Tabasco sauce. Premade or packaged marinades. Premade or packaged taco seasonings. Relishes. Regular salad dressings. Where to find more information:  National Heart, Lung, and [place]: https://wilson-eaton.com/  American Heart Association: www.heart.org Summary  The DASH eating plan is a healthy eating plan that has been shown to reduce high blood pressure (hypertension). It may also reduce your risk for type 2 diabetes, heart disease, and stroke.  With the DASH eating plan, you should limit salt (sodium) intake to 2,300 mg a day. If you have hypertension, you may need to reduce your sodium intake to 1,500 mg  a day.  When on the DASH eating plan, aim to eat more fresh fruits and vegetables, whole grains, lean proteins, low-fat dairy, and heart-healthy fats.  Work with your health care provider or diet and nutrition specialist (dietitian) to adjust your eating plan to your individual calorie needs. This information is not intended to replace advice given to you by your health care provider. Make sure you discuss any questions you have with your health care provider. Document Released: 07/25/2011 Document Revised: 07/29/2016 Document Reviewed: 07/29/2016 Elsevier Interactive Patient Education  2017 [place].     How to Use Compression Stockings Compression stockings are elastic socks that squeeze the legs. They help to increase blood flow to the legs, decrease swelling in the legs, and reduce the chance of developing blood clots in the lower legs. Compression stockings are often used by people who:  Are recovering from surgery.  Have poor circulation in their legs.  Are prone to getting blood clots in their legs.  Have varicose veins.  Sit or stay in bed for long periods of time.  How to use compression stockings Before you put on your compression stockings:  Make sure that they are the correct size. If you do not know your size, ask your health care provider.  Make sure that they are clean, dry, and in good condition.  Check them for rips and tears. Do not put them on if they are ripped or torn.  Put your stockings on first thing in the morning, before you get out of bed. Keep them on for as long as your health care provider advises. When you are wearing your stockings:  Keep them as smooth as possible. Do not allow them to bunch up. It is especially important to prevent the stockings from bunching up around your toes or behind your knees.  Do not roll the stockings downward and leave them rolled down. This can decrease blood flow to your leg.  Change them right away if they  become wet or dirty. 1.   When you take off your stockings, inspect your legs and feet. Anything that does not seem normal may require medical attention. Look for:  Open sores.  Red spots.  Swelling.  Information and tips  Do not stop wearing your compression stockings without talking to your health care provider first.  Wash your stockings every day with mild detergent in cold or warm water. Do not use bleach. Air-dry your stockings or dry them in a clothes dryer on low heat.  Replace your stockings every 3-6 months.  If skin moisturizing is part of your treatment plan, apply lotion or cream at night so that your skin will be dry when you put on the stockings in the morning. It is harder to put the stockings on when you have lotion on your legs or feet. Contact a health care provider if: Remove your stockings and seek medical care if:  You have a feeling of pins and needles in your feet or legs.  You have any new changes in your skin.  You have skin lesions that are getting worse.  You have swelling or pain that is getting worse.  Get help right away if:  You have numbness or tingling in your lower legs that does not get better right after you take the stockings off.  Your toes or feet become cold and blue.  You develop open sores or red spots on your legs that do not go away.  You see or feel a warm spot on your leg.  You have new swelling or soreness in your leg.  You are short of breath or you have chest pain for no reason.  You have a rapid or irregular heartbeat.  You feel light-headed or dizzy. This information is not intended to replace advice given to you by your health care provider. Make sure you discuss any questions you have with your health care provider. Document Released: 06/02/2009 Document Revised: 01/03/2016 Document Reviewed: 07/13/2014 Elsevier Interactive Patient Education  [employer].  If you need a refill on your cardiac medications  before your next appointment, please call your pharmacy.

## 2017-05-08 ENCOUNTER — Telehealth: Payer: Self-pay | Admitting: *Deleted

## 2017-05-08 ENCOUNTER — Other Ambulatory Visit: Payer: Self-pay | Admitting: *Deleted

## 2017-05-08 DIAGNOSIS — Z136 Encounter for screening for cardiovascular disorders: Secondary | ICD-10-CM

## 2017-05-08 DIAGNOSIS — I359 Nonrheumatic aortic valve disorder, unspecified: Secondary | ICD-10-CM

## 2017-05-08 DIAGNOSIS — I509 Heart failure, unspecified: Secondary | ICD-10-CM

## 2017-05-08 DIAGNOSIS — Z79899 Other long term (current) drug therapy: Secondary | ICD-10-CM

## 2017-05-08 DIAGNOSIS — Z13220 Encounter for screening for lipoid disorders: Secondary | ICD-10-CM

## 2017-05-08 DIAGNOSIS — I10 Essential (primary) hypertension: Secondary | ICD-10-CM

## 2017-05-08 DIAGNOSIS — Z7901 Long term (current) use of anticoagulants: Secondary | ICD-10-CM

## 2017-05-08 NOTE — Telephone Encounter (Signed)
Lab orders faxed to [facility] [phone]

## 2017-05-09 LAB — HEPATIC FUNCTION PANEL
ALK PHOS: 72 (ref 25–125)
ALT: 17 (ref 7–35)
AST: 16 (ref 13–35)
Bilirubin, Total: 0.5

## 2017-05-09 LAB — BASIC METABOLIC PANEL
BUN: 15 (ref 4–21)
CREATININE: 0.4 — AB (ref 0.5–1.1)
Glucose: 121
Potassium: 3.8 (ref 3.4–5.3)
Sodium: 144 (ref 137–147)

## 2017-05-09 LAB — LIPID PANEL
CHOLESTEROL: 170 (ref 0–200)
HDL: 52 (ref 35–70)
LDL Cholesterol: 95
Triglycerides: 117 (ref 40–160)

## 2017-05-09 LAB — TSH: TSH: 4.03 (ref 0.41–5.90)

## 2017-05-09 LAB — CBC AND DIFFERENTIAL
HCT: 44 (ref 36–46)
Hemoglobin: 14 (ref 12.0–16.0)
PLATELETS: 139 — AB (ref 150–399)
WBC: 5

## 2017-05-13 ENCOUNTER — Encounter: Payer: Self-pay | Admitting: Internal Medicine

## 2017-05-13 ENCOUNTER — Non-Acute Institutional Stay (SKILLED_NURSING_FACILITY): Payer: Medicare Other | Admitting: Internal Medicine

## 2017-05-13 DIAGNOSIS — I5031 Acute diastolic (congestive) heart failure: Secondary | ICD-10-CM | POA: Diagnosis not present

## 2017-05-13 DIAGNOSIS — F0390 Unspecified dementia without behavioral disturbance: Secondary | ICD-10-CM | POA: Diagnosis not present

## 2017-05-13 DIAGNOSIS — J9601 Acute respiratory failure with hypoxia: Secondary | ICD-10-CM

## 2017-05-13 DIAGNOSIS — E118 Type 2 diabetes mellitus with unspecified complications: Secondary | ICD-10-CM | POA: Diagnosis not present

## 2017-05-13 DIAGNOSIS — N6320 Unspecified lump in the left breast, unspecified quadrant: Secondary | ICD-10-CM | POA: Diagnosis not present

## 2017-05-13 DIAGNOSIS — I10 Essential (primary) hypertension: Secondary | ICD-10-CM

## 2017-05-13 DIAGNOSIS — N182 Chronic kidney disease, stage 2 (mild): Secondary | ICD-10-CM

## 2017-05-13 DIAGNOSIS — J9602 Acute respiratory failure with hypercapnia: Secondary | ICD-10-CM | POA: Diagnosis not present

## 2017-05-13 DIAGNOSIS — R600 Localized edema: Secondary | ICD-10-CM

## 2017-05-13 DIAGNOSIS — I825Y2 Chronic embolism and thrombosis of unspecified deep veins of left proximal lower extremity: Secondary | ICD-10-CM | POA: Diagnosis not present

## 2017-05-13 DIAGNOSIS — D696 Thrombocytopenia, unspecified: Secondary | ICD-10-CM | POA: Diagnosis not present

## 2017-05-13 DIAGNOSIS — R627 Adult failure to thrive: Secondary | ICD-10-CM

## 2017-05-13 DIAGNOSIS — N179 Acute kidney failure, unspecified: Secondary | ICD-10-CM | POA: Diagnosis not present

## 2017-05-13 NOTE — Progress Notes (Signed)
Location:  [facility] Room Number: [place]:  SNF [phone])  Provider: Noah Delaine. Sheppard Coil, MD  PCP: [name], DO Patient Care Team: [name], DO as PCP - General (Internal Medicine)  Extended Emergency Contact Information Primary Emergency Contact: Bonfield,Chris Address: [address]          [place], [place] [address] Johnnette Litter of [place] Phone: [phone] Mobile Phone: [phone] Relation: Son Secondary Emergency Contact: Haxton,David Address: [place] [address] Johnnette Litter of [place] Phone: [phone] Mobile Phone: [phone] Relation: Spouse  No Known Allergies  Chief Complaint  Patient presents with  . Discharge Note    discharge from SNF to home    HPI:  71 y.o. female with dementia, hypertension, who presented to [facility] emergency department shortness of breath for the last 3 weeks and progressive lower limb swelling. Patient have a diagnosed with hypertension a while ago but had refused to take her medications. Patient was admitted to [facility] from 9/7-13 for acute on chronic congestive heart failure, acute kidney injury on chronic kidney disease stage III, and newly diagnosed left lower DVT which will be treated with Eliquis. Hospital course was further complicated by thrombocytopenia with a platelet nadir of 81, a new diagnosis of diabetes mellitus with a hemoglobin A1c of 7.8, progressive dementia and confusion, and a left breast mass about 6 cm in diameter which patient's husband was not interested in having biopsy. He was in the patient had been failing to thrive in skilled nursing placement was recommended. Patient was admitted to skilled nursing facility with generalized weakness and failure to thrive for OT/PT and patient is now ready to be discharged to home.     Past Medical History:  Diagnosis Date  . Acute diastolic (congestive) heart failure  ([place]) 04/25/2017  . Acute respiratory failure with hypoxia and hypercapnia ([place]) 04/25/2017  . ALLERGIC RHINITIS 07/01/2007   Qualifier: Diagnosis of  By: Sherren Mocha MD, Jory Ee   . Allergy   . Aortic insufficiency   . Aortic valve disorder 07/01/2007   Qualifier: Diagnosis of  By: Sherren Mocha MD, Dellis Filbert A   . Bilateral leg edema 04/25/2017  . Borderline diabetic   . CHF (congestive heart failure) ([place]) 04/25/2017  . Dementia 04/30/2017  . Diabetes mellitus ([place]) 04/25/2017  . DOE (dyspnea on exertion) 04/25/2017  . Essential hypertension 07/01/2007   Qualifier: Diagnosis of  By: Sherren Mocha MD, Jory Ee Glaucoma suspect   . Hypertension   . KNEE PAIN, LEFT 04/06/2010   Qualifier: Diagnosis of  By: Sherren Mocha MD, Jory Ee   . Obesity   . Obesity, unspecified 07/01/2007   Qualifier: Diagnosis of  By: Sherren Mocha MD, Jory Ee   . Thyroid disease     History reviewed. No pertinent surgical history.   reports that she has never smoked. She has never used smokeless tobacco. She reports that she does not drink alcohol or use drugs. Social History   Social History  . Marital status: Married    Spouse name: N/A  . Number of children: N/A  . Years of education: N/A   Occupational History  . retired Building control surveyor    Social History Main Topics  . Smoking status: Never Smoker  . Smokeless tobacco: Never Used  . Alcohol use No  . Drug use: No  . Sexual activity: Not Currently   Other Topics Concern  .  Not on file   Social History Narrative   Social History   Diet?    Do you drink/eat things with caffeine? Yes (minimal) coke   Marital status?          married                          What year were you married? 1968   Do you live in a house, apartment, assisted living, condo, trailer, etc.? house   Is it one or more stories? one   How many persons live in your home? 2   Do you have any pets in your home? (please list) none   Highest level of education completed?   Current or past profession: preschool  teacher/nanny   Do you exercise?         0                             Type & how often?   Advanced Directives   Do you have a living will?   Do you have a DNR form?                                  If not, do you want to discuss one?   Do you have signed POA/HPOA for forms?    Functional Status   Do you have difficulty bathing or dressing yourself?   Do you have difficulty preparing food or eating?    Do you have difficulty managing your medications?   Do you have difficulty managing your finances?   Do you have difficulty affording your medications?    Admitted to [facility] 05/01/17   Never smoked   Alcohol none   DNR          Pertinent  Health Maintenance Due  Topic Date Due  . FOOT EXAM  12/22/1955  . OPHTHALMOLOGY EXAM  12/22/1955  . INFLUENZA VACCINE  06/19/2017 (Originally 03/19/2017)  . MAMMOGRAM  08/19/2017 (Originally 12/22/1994)  . DEXA SCAN  08/19/2017 (Originally 12/21/2009)  . COLONOSCOPY  08/19/2017 (Originally 12/22/1994)  . HEMOGLOBIN A1C  10/24/2017  . PNA vac Low Risk Adult (2 of 2 - PCV13) 04/29/2018    Medications: Allergies as of 05/13/2017   No Known Allergies     Medication List       Accurate as of 05/13/17  2:07 PM. Always use your most recent med list.          aspirin 81 MG EC tablet Take 1 tablet (81 mg total) by mouth daily.   carvedilol 3.125 MG tablet Commonly known as:  COREG Take 1 tablet (3.125 mg total) by mouth 2 (two) times daily with a meal.   ELIQUIS 5 MG Tabs tablet Generic drug:  apixaban Take 5 mg by mouth. Take one tablet daily   furosemide 40 MG tablet Commonly known as:  LASIX Take 1.5 tablets (60 mg total) by mouth daily.   isosorbide mononitrate 30 MG 24 hr tablet Commonly known as:  IMDUR Take 0.5 tablets (15 mg total) by mouth daily.   lisinopril 5 MG tablet Commonly known as:  PRINIVIL,ZESTRIL Take 1 tablet (5 mg total) by mouth daily.   metFORMIN 500 MG 24 hr tablet Commonly known as:   GLUCOPHAGE-XR Take 1 tablet (500 mg total) by mouth 2 (two) times  daily with a meal.            Discharge Care Instructions        Start     Ordered   05/13/17 0000  CBC and differential    Comments:  This external order was created through the Results Console.    05/13/17 1333   05/13/17 6378  Basic metabolic panel    Comments:  This external order was created through the Results Console.    05/13/17 1333   05/13/17 0000  CBC and differential    Comments:  This external order was created through the Results Console.    05/13/17 1337   05/13/17 5885  Basic metabolic panel    Comments:  This external order was created through the Results Console.    05/13/17 1337   05/13/17 0000  Lipid panel    Comments:  This external order was created through the Results Console.    05/13/17 1337   05/13/17 0000  Hepatic function panel    Comments:  This external order was created through the Results Console.    05/13/17 1337   05/13/17 0000  TSH    Comments:  This external order was created through the Results Console.    05/13/17 1337       Vitals:   05/13/17 1311  BP: 137/65  Pulse: 79  Resp: (!) 25  Temp: 97.7 F (36.5 C)  SpO2: 98%  Weight: 160 lb (72.6 kg)  Height: 5' (1.524 m)   Body mass index is 31.25 kg/m.  Physical Exam  GENERAL APPEARANCE: Alert,  No acute distress.  HEENT: Unremarkable. RESPIRATORY: Breathing is even, unlabored. Lung sounds are clear   CARDIOVASCULAR: Heart RRR no murmurs, rubs or gallops. + peripheral edema.  GASTROINTESTINAL: Abdomen is soft, non-tender, not distended w/ normal bowel sounds.  NEUROLOGIC: Cranial nerves 2-12 grossly intact. Moves all extremities   Labs reviewed: Basic Metabolic Panel:  Recent Labs  04/28/17 0218 04/29/17 0533 04/30/17 0525 05/06/17 05/09/17  NA 140 138 138 142 144  K 3.6 3.4* 3.9 4.5 3.8  CL 98* 96* 96*  --   --   CO2 32 35* 33*  --   --   GLUCOSE 68 149* 133*  --   --   BUN 22* 22* 21*  17 15  CREATININE 0.88 0.91 0.89 0.6 0.4*  CALCIUM 8.0* 7.8* 8.2*  --   --   MG 1.8  --  2.0  --   --   PHOS 3.5  --   --   --   --    Lab Results  Component Value Date   MICROALBUR 0.3 02/08/2009   Liver Function Tests:  Recent Labs  04/25/17 1310 04/27/17 0337 04/30/17 0525 05/09/17  AST 33 30 29 16   ALT 42 34 28 17  ALKPHOS 63 46 45 72  BILITOT 1.8* 1.3* 1.3*  --   PROT 6.1* 4.7* 5.1*  --   ALBUMIN 3.9 2.8* 3.0*  --    No results for input(s): LIPASE, AMYLASE in the last 8760 hours.  Recent Labs  04/30/17 0525  AMMONIA 36*   CBC:  Recent Labs  04/28/17 0218 04/29/17 0533 04/30/17 0525 05/06/17 05/09/17  WBC 9.3 6.3 9.1 4.7 5.0  HGB 15.5* 14.3 16.7* 14.7 14.0  HCT 47.2* 44.1 49.8* 46 44  MCV 95.4 96.5 96.1  --   --   PLT 114* 102* 98* 161 139*   Lipid  Recent Labs  05/09/17  CHOL 170  HDL 52  LDLCALC 95  TRIG 117   Cardiac Enzymes:  Recent Labs  04/26/17 0943  TROPONINI 0.37*   BNP:  Recent Labs  04/25/17 1812 04/28/17 0219  BNP >4,500.0* 1,411.7*   CBG:  Recent Labs  04/30/17 2105 05/01/17 0725 05/01/17 1132  GLUCAP 221* 144* 237*    Procedures and Imaging Studies During Stay: Ct Head Wo Contrast  Result Date: 04/29/2017 CLINICAL DATA:  71 y/o  F; increase forgetfulness and lethargy. EXAM: CT HEAD WITHOUT CONTRAST TECHNIQUE: Contiguous axial images were obtained from the base of the skull through the vertex without intravenous contrast. COMPARISON:  None. FINDINGS: Brain: No evidence of acute infarction, hemorrhage, hydrocephalus, extra-axial collection or mass lesion/mass effect. Moderate chronic microvascular ischemic changes of white matter parenchymal volume loss of the brain. Small chronic lacunar infarcts within the cerebellar hemispheres bilaterally, the left thalamus, and the right caudate head. Vascular: Extensive calcific atherosclerosis of carotid siphons. Skull: Normal. Negative for fracture or focal lesion.  Sinuses/Orbits: No acute finding. Other: None. IMPRESSION: 1. No acute intracranial abnormality. 2. Moderate chronic microvascular ischemic changes and parenchymal volume loss of the brain. Small chronic lacunar infarcts in cerebellum and basal ganglia. Electronically Signed   By: Kristine Garbe M.D.   On: 04/29/2017 22:45   Dg Chest Port 1 View  Result Date: 04/28/2017 CLINICAL DATA:  Shortness of breath, acute respiratory failure EXAM: PORTABLE CHEST 1 VIEW COMPARISON:  04/27/2017 FINDINGS: Cardiomegaly with mild interstitial edema. Suspected trace right and small left pleural effusions. No pneumothorax. IMPRESSION: Cardiomegaly with mild interstitial edema, unchanged. Bilateral pleural effusions, left greater than right. Electronically Signed   By: Julian Hy M.D.   On: 04/28/2017 07:20   Dg Chest Port 1 View  Result Date: 04/27/2017 CLINICAL DATA:  Respiratory failure EXAM: PORTABLE CHEST 1 VIEW COMPARISON:  92,018 FINDINGS: Bilateral airspace disease unchanged. Left lower lobe atelectasis and effusion unchanged. Small right effusion. IMPRESSION: No significant interval change. Probable heart failure with pulmonary edema and pleural effusions. Electronically Signed   By: Franchot Gallo M.D.   On: 04/27/2017 07:19   Dg Chest Port 1 View  Result Date: 04/26/2017 CLINICAL DATA:  Short of breath EXAM: PORTABLE CHEST 1 VIEW COMPARISON:  04/25/2017 FINDINGS: Progression of diffuse bilateral airspace disease compatible with worsening pulmonary edema. Progression of left effusion and left lower lobe atelectasis. IMPRESSION: Worsening heart failure with increase in edema and left effusion. Progression of left lower lobe atelectasis. Electronically Signed   By: Franchot Gallo M.D.   On: 04/26/2017 08:11   Dg Chest Port 1 View  Result Date: 04/25/2017 CLINICAL DATA:  Altered mental status for several weeks, increased agitation. EXAM: PORTABLE CHEST 1 VIEW COMPARISON:  None. FINDINGS: Patchy  bilateral airspace opacities, most prominent at the left lung base. Heart size cannot be characterized as margins are obscured by the overlying airspace opacities. Atherosclerotic changes noted at the aortic arch. No acute or suspicious osseous finding. IMPRESSION: 1. Patchy bilateral airspace opacities, most likely pulmonary edema. 2. Denser opacity at the left lung base could represent pneumonia, atelectasis and/or small pleural effusion. 3.  Aortic atherosclerosis. Electronically Signed   By: Franki Cabot M.D.   On: 04/25/2017 18:03    Assessment/Plan:   Acute respiratory failure with hypoxia and hypercapnia (HCC)  Acute diastolic (congestive) heart failure (HCC)  Chronic deep vein thrombosis (DVT) of proximal vein of left lower extremity ([place])  Essential hypertension  Type 2 diabetes mellitus with complication, without long-term current use  of insulin ([place])  Acute renal failure superimposed on stage 2 chronic kidney disease, unspecified acute renal failure type (HCC)  Bilateral leg edema  Breast mass, left  Failure to thrive in adult  Thrombocytopenia (HCC)  Dementia without behavioral disturbance, unspecified dementia type   Patient is being discharged with the following home health services:  OT/PT/nursing  Patient is being discharged with the following durable medical equipment:  None  Patient has been advised to f/u with their PCP in 1-2 weeks to bring them up to date on their rehab stay.  Social services at facility was responsible for arranging this appointment.  Pt was provided with a 30 day supply of prescriptions for medications and refills must be obtained from their PCP.  For controlled substances, a more limited supply may be provided adequate until PCP appointment only.  Medications have been reconciled.   Time spent greater than 30 minutes;> 50% of time with patient was spent reviewing records, labs, tests and studies, counseling and developing plan of  care  Noah Delaine. Sheppard Coil, MD

## 2017-05-14 ENCOUNTER — Other Ambulatory Visit: Payer: Self-pay | Admitting: *Deleted

## 2017-05-14 NOTE — Patient Outreach (Signed)
[facility]) Care Management  05/14/2017  JOE TANNEY 09/19/1945 [id number]   Met with patient and spouse at bedside of facility 05/13/17. Spouse reports he will be primary caregiver for patient upon discharge home.  He states that patient has new diagnosis and new medications because patient had not been seen by an MD in over 5 years and thus when she had problems they found a lot of issues.   RNCM reviewed in depth [facility] care management services. Spouse is interested to have information for future reference. He feels she could benefit but with getting home care services, he does not want to get overwhelmed or have patient overwhelmed.  Left [facility] brochure, 24 hour nurse line magnet and RNCM contact information with patient spouse.   RNCM let Marita [place], SW at facility know that patient is eligible for [facility] services and information left with patient spouse.  She will set up home care and any DME for patient.   Plan to sign off. Royetta Crochet. Laymond Purser, RN, BSN, [place] [phone]) Business Cell  [phone]) Toll Free Office

## 2017-05-16 DIAGNOSIS — I5033 Acute on chronic diastolic (congestive) heart failure: Secondary | ICD-10-CM | POA: Diagnosis not present

## 2017-05-16 DIAGNOSIS — M6281 Muscle weakness (generalized): Secondary | ICD-10-CM | POA: Diagnosis not present

## 2017-05-16 DIAGNOSIS — E1122 Type 2 diabetes mellitus with diabetic chronic kidney disease: Secondary | ICD-10-CM | POA: Diagnosis not present

## 2017-05-16 DIAGNOSIS — I824Y2 Acute embolism and thrombosis of unspecified deep veins of left proximal lower extremity: Secondary | ICD-10-CM | POA: Diagnosis not present

## 2017-05-16 DIAGNOSIS — F0390 Unspecified dementia without behavioral disturbance: Secondary | ICD-10-CM

## 2017-05-16 DIAGNOSIS — M25562 Pain in left knee: Secondary | ICD-10-CM

## 2017-05-16 DIAGNOSIS — I130 Hypertensive heart and chronic kidney disease with heart failure and stage 1 through stage 4 chronic kidney disease, or unspecified chronic kidney disease: Secondary | ICD-10-CM | POA: Diagnosis not present

## 2017-05-16 DIAGNOSIS — N182 Chronic kidney disease, stage 2 (mild): Secondary | ICD-10-CM | POA: Diagnosis not present

## 2017-05-19 DIAGNOSIS — I5033 Acute on chronic diastolic (congestive) heart failure: Secondary | ICD-10-CM | POA: Diagnosis not present

## 2017-05-19 DIAGNOSIS — M25562 Pain in left knee: Secondary | ICD-10-CM | POA: Diagnosis not present

## 2017-05-19 DIAGNOSIS — N182 Chronic kidney disease, stage 2 (mild): Secondary | ICD-10-CM | POA: Diagnosis not present

## 2017-05-19 DIAGNOSIS — Z7984 Long term (current) use of oral hypoglycemic drugs: Secondary | ICD-10-CM | POA: Diagnosis not present

## 2017-05-19 DIAGNOSIS — N63 Unspecified lump in unspecified breast: Secondary | ICD-10-CM | POA: Diagnosis not present

## 2017-05-19 DIAGNOSIS — I130 Hypertensive heart and chronic kidney disease with heart failure and stage 1 through stage 4 chronic kidney disease, or unspecified chronic kidney disease: Secondary | ICD-10-CM | POA: Diagnosis not present

## 2017-05-19 DIAGNOSIS — Z9181 History of falling: Secondary | ICD-10-CM | POA: Diagnosis not present

## 2017-05-19 DIAGNOSIS — I824Y2 Acute embolism and thrombosis of unspecified deep veins of left proximal lower extremity: Secondary | ICD-10-CM | POA: Diagnosis not present

## 2017-05-19 DIAGNOSIS — Z9114 Patient's other noncompliance with medication regimen: Secondary | ICD-10-CM | POA: Diagnosis not present

## 2017-05-19 DIAGNOSIS — Z7901 Long term (current) use of anticoagulants: Secondary | ICD-10-CM | POA: Diagnosis not present

## 2017-05-19 DIAGNOSIS — E1122 Type 2 diabetes mellitus with diabetic chronic kidney disease: Secondary | ICD-10-CM | POA: Diagnosis not present

## 2017-05-19 DIAGNOSIS — M6281 Muscle weakness (generalized): Secondary | ICD-10-CM | POA: Diagnosis not present

## 2017-05-20 ENCOUNTER — Encounter: Payer: Self-pay | Admitting: Internal Medicine

## 2017-05-20 ENCOUNTER — Ambulatory Visit (INDEPENDENT_AMBULATORY_CARE_PROVIDER_SITE_OTHER): Payer: Medicare Other | Admitting: Internal Medicine

## 2017-05-20 ENCOUNTER — Ambulatory Visit: Payer: Medicare Other | Admitting: Internal Medicine

## 2017-05-20 VITALS — BP 170/80 | HR 81 | Temp 98.2°F | Resp 16 | Ht 60.0 in | Wt 159.4 lb

## 2017-05-20 DIAGNOSIS — Z23 Encounter for immunization: Secondary | ICD-10-CM | POA: Diagnosis not present

## 2017-05-20 DIAGNOSIS — I359 Nonrheumatic aortic valve disorder, unspecified: Secondary | ICD-10-CM

## 2017-05-20 DIAGNOSIS — I825Y2 Chronic embolism and thrombosis of unspecified deep veins of left proximal lower extremity: Secondary | ICD-10-CM

## 2017-05-20 DIAGNOSIS — E118 Type 2 diabetes mellitus with unspecified complications: Secondary | ICD-10-CM | POA: Diagnosis not present

## 2017-05-20 DIAGNOSIS — I10 Essential (primary) hypertension: Secondary | ICD-10-CM

## 2017-05-20 DIAGNOSIS — I5022 Chronic systolic (congestive) heart failure: Secondary | ICD-10-CM

## 2017-05-20 DIAGNOSIS — R627 Adult failure to thrive: Secondary | ICD-10-CM | POA: Diagnosis not present

## 2017-05-20 DIAGNOSIS — N6320 Unspecified lump in the left breast, unspecified quadrant: Secondary | ICD-10-CM

## 2017-05-20 DIAGNOSIS — R600 Localized edema: Secondary | ICD-10-CM | POA: Diagnosis not present

## 2017-05-20 DIAGNOSIS — R413 Other amnesia: Secondary | ICD-10-CM | POA: Diagnosis not present

## 2017-05-20 NOTE — Progress Notes (Signed)
Patient ID: Charlene Rubio, female   DOB: August 13, 1946, 71 y.o.   MRN: [id number]    Location:  [facility] Place of Service: OFFICE  Chief Complaint  Patient presents with  . Hospitalization Follow-up    hospital and rehab follow up; flu shot    HPI:  71 yo female seen today following hospital stay for new Left breast mass, acute diastolic HF, FTT (albumin 2.8), dementia, AI, acute LLE DVT, AKI, thrombocytopenia, HTN. She rec'd pneumovax during hospital stay.CT head revealed mod chronic microvascular ischemic changes and parenchymal volume loss of brain; small chronic lacunar infarcts in cerebellum and basal ganglia. CXR initially showed patchy b/l airspace opacities most likely pulmonary edema; left lung base dense opacity; aortic atherosclerosis. 2D echo EF 25-30% with moderate AR, mild MR, mild LA dilated, PA pressure 36 mm Hg. LLE venous doppler US revealed chronic DVT of left distal femoral, popliteal, posterior tibial and peroneal veins; incidental very small right Baker's cyst. Cr 1.22-->0.89; K dropped 2.9-->3.9; albumin 2.8-->3; total bilirubin 1.8-->1.3; BNP >4500-->1411.7; CBG 428 initially but remained 120-->190s; Hgb 16.7; Hct 49.8; Plts dropped 81K-->98K; B12 level 338; folate 12; NH3 level 36 at d/c.  Today she reports no SOB, leg swelling improved. She saw cardiology Dr Georgina Peer and scheduled for stress test Oct 10th followed by routine appt in 6 weeks. No BRBPR, melena. No nausea or loss of appetite. She does not check BS at home. No low BS reactions. She reduced salt intake. She has a hard time keeping legs elevated when seated. Weight overall stable. She checks daily at home.   She is a poor historian due to memory loss. She does not even recall meeting me prior to hospitalization. Hx obtained from chart   Past Medical History:  Diagnosis Date  . Acute diastolic (congestive) heart failure ([place]) 04/25/2017  . Acute respiratory failure with hypoxia and hypercapnia ([place]) 04/25/2017  . ALLERGIC  RHINITIS 07/01/2007   Qualifier: Diagnosis of  By: Sherren Mocha MD, Jory Ee   . Allergy   . Aortic insufficiency   . Aortic valve disorder 07/01/2007   Qualifier: Diagnosis of  By: Sherren Mocha MD, Dellis Filbert A   . Bilateral leg edema 04/25/2017  . Borderline diabetic   . CHF (congestive heart failure) ([place]) 04/25/2017  . Dementia 04/30/2017  . Diabetes mellitus ([place]) 04/25/2017  . DOE (dyspnea on exertion) 04/25/2017  . Essential hypertension 07/01/2007   Qualifier: Diagnosis of  By: Sherren Mocha MD, Jory Ee Glaucoma suspect   . Hypertension   . KNEE PAIN, LEFT 04/06/2010   Qualifier: Diagnosis of  By: Sherren Mocha MD, Jory Ee   . Obesity   . Obesity, unspecified 07/01/2007   Qualifier: Diagnosis of  By: Sherren Mocha MD, Jory Ee   . Thyroid disease     History reviewed. No pertinent surgical history.  Patient Care Team: [name], DO as PCP - General (Internal Medicine)  Social History   Social History  . Marital status: Married    Spouse name: N/A  . Number of children: N/A  . Years of education: N/A   Occupational History  . retired Building control surveyor    Social History Main Topics  . Smoking status: Never Smoker  . Smokeless tobacco: Never Used  . Alcohol use No  . Drug use: No  . Sexual activity: Not Currently   Other Topics Concern  . Not on file   Social History Narrative   Social History   Diet?    Do you drink/eat things with caffeine?  Yes (minimal) coke   Marital status?          married                          What year were you married? 1968   Do you live in a house, apartment, assisted living, condo, trailer, etc.? house   Is it one or more stories? one   How many persons live in your home? 2   Do you have any pets in your home? (please list) none   Highest level of education completed?   Current or past profession: preschool teacher/nanny   Do you exercise?         0                             Type & how often?   Advanced Directives   Do you have a living will?   Do you have a DNR  form?                                  If not, do you want to discuss one?   Do you have signed POA/HPOA for forms?    Functional Status   Do you have difficulty bathing or dressing yourself?   Do you have difficulty preparing food or eating?    Do you have difficulty managing your medications?   Do you have difficulty managing your finances?   Do you have difficulty affording your medications?    Admitted to [facility] 05/01/17   Never smoked   Alcohol none   DNR           reports that she has never smoked. She has never used smokeless tobacco. She reports that she does not drink alcohol or use drugs.  Family History  Problem Relation Age of Onset  . Alzheimer's disease Mother 45  . Alzheimer's disease Father 102  . Congestive Heart Failure Father   . Alcoholism Brother   . Alzheimer's disease Brother   . Dementia Brother   . Alcoholism Brother   . Diabetes Brother    Family Status  Relation Status  . Mother Deceased  . Father Deceased  . Brother Alive  . Brother Alive  . Brother Deceased  . Son Alive  . Son Alive  . Son Alive     No Known Allergies  Medications: Patient's Medications  New Prescriptions   No medications on file  Previous Medications   APIXABAN (ELIQUIS) 5 MG TABS TABLET    Take 5 mg by mouth. Take one tablet daily   ASPIRIN EC 81 MG EC TABLET    Take 1 tablet (81 mg total) by mouth daily.   CARVEDILOL (COREG) 3.125 MG TABLET    Take 1 tablet (3.125 mg total) by mouth 2 (two) times daily with a meal.   FUROSEMIDE (LASIX) 40 MG TABLET    Take 1.5 tablets (60 mg total) by mouth daily.   ISOSORBIDE MONONITRATE (IMDUR) 30 MG 24 HR TABLET    Take 0.5 tablets (15 mg total) by mouth daily.   LISINOPRIL (PRINIVIL,ZESTRIL) 5 MG TABLET    Take 1 tablet (5 mg total) by mouth daily.   METFORMIN (GLUCOPHAGE-XR) 500 MG 24 HR TABLET    Take 1 tablet (500 mg total) by mouth 2 (two) times daily with  a meal.  Modified Medications   No medications on file   Discontinued Medications   No medications on file    Review of Systems  Unable to perform ROS: Other (memory loss)    Vitals:   05/20/17 1455  BP: (!) 170/80  Pulse: 81  Resp: 16  Temp: 98.2 F (36.8 C)  TempSrc: Oral  SpO2: 97%  Weight: 159 lb 6.4 oz (72.3 kg)  Height: 5' (1.524 m)   Body mass index is 31.13 kg/m.  Physical Exam  Constitutional: She appears well-developed.  Frail appearing in NAD  HENT:  Mouth/Throat: Oropharynx is clear and moist. No oropharyngeal exudate.  MMM; no oral thrush  Eyes: Pupils are equal, round, and reactive to light. No scleral icterus.  Neck: Neck supple. Carotid bruit is not present. No tracheal deviation present. No thyromegaly present.  Cardiovascular: Normal rate, regular rhythm and intact distal pulses.  Exam reveals no gallop and no friction rub.   Murmur heard.  Diastolic murmur is present with a grade of 2/6  Left +2 pitting LE edema with calf TTP and palpable  Cord; RLE +1 pitting edema with calf TTP  Pulmonary/Chest: Effort normal and breath sounds normal. No stridor. No respiratory distress. She has no wheezes. She has no rales. She exhibits no tenderness. Right breast exhibits no inverted nipple, no mass, no nipple discharge, no skin change and no tenderness. Left breast exhibits mass (plum sized, NT at 6 o'clock areolar). Left breast exhibits no inverted nipple, no nipple discharge, no skin change and no tenderness. Breasts are asymmetrical.  Abdominal: Soft. Normal appearance and bowel sounds are normal. She exhibits no distension and no mass. There is no hepatomegaly. There is no tenderness. There is no rigidity, no rebound and no guarding. No hernia.  Musculoskeletal: She exhibits edema.  Lymphadenopathy:    She has no cervical adenopathy.  Neurological: She is alert.  Skin: Skin is warm and dry. No rash noted.  Psychiatric: She has a normal mood and affect. Her behavior is normal. Thought content normal.     Labs  reviewed: Nursing Home on 05/13/2017  Component Date Value Ref Range Status  . Hemoglobin 05/06/2017 14.7  12.0 - 16.0 Final  . HCT 05/06/2017 46  36 - 46 Final  . Platelets 05/06/2017 161  150 - 399 Final  . WBC 05/06/2017 4.7   Final  . Glucose 05/06/2017 135   Final  . BUN 05/06/2017 17  4 - 21 Final  . Creatinine 05/06/2017 0.6  0.5 - 1.1 Final  . Potassium 05/06/2017 4.5  3.4 - 5.3 Final  . Sodium 05/06/2017 142  137 - 147 Final  . Hemoglobin 05/09/2017 14.0  12.0 - 16.0 Final  . HCT 05/09/2017 44  36 - 46 Final  . Platelets 05/09/2017 139* 150 - 399 Final  . WBC 05/09/2017 5.0   Final  . Glucose 05/09/2017 121   Final  . BUN 05/09/2017 15  4 - 21 Final  . Creatinine 05/09/2017 0.4* 0.5 - 1.1 Final  . Potassium 05/09/2017 3.8  3.4 - 5.3 Final  . Sodium 05/09/2017 144  137 - 147 Final  . Triglycerides 05/09/2017 117  40 - 160 Final  . Cholesterol 05/09/2017 170  0 - 200 Final  . HDL 05/09/2017 52  35 - 70 Final  . LDL Cholesterol 05/09/2017 95   Final  . Alkaline Phosphatase 05/09/2017 72  25 - 125 Final  . ALT 05/09/2017 17  7 - 35 Final  .  AST 05/09/2017 16  13 - 35 Final  . Bilirubin, Total 05/09/2017 0.5   Final  . TSH 05/09/2017 4.03  0.41 - 5.90 Final  Admission on 04/25/2017, Discharged on 05/01/2017  No results displayed because visit has over 200 results.    Office Visit on 04/25/2017  Component Date Value Ref Range Status  . Color, UA 04/25/2017 Dark Yellow   Final  . Clarity, UA 04/25/2017 Cloudy   Final  . Glucose, UA 04/25/2017 250   Final   Could be related to dark color of urine   . Bilirubin, UA 04/25/2017 Neg   Final  . Ketones, UA 04/25/2017 Neg   Final  . Spec Grav, UA 04/25/2017 >=1.030* 1.010 - 1.025 Final  . Blood, UA 04/25/2017 Moderate   Final  . pH, UA 04/25/2017 6.0  5.0 - 8.0 Final  . Protein, UA 04/25/2017 300   Final  . Urobilinogen, UA 04/25/2017 2.0* 0.2 or 1.0 E.U./dL Final  . Nitrite, UA 04/25/2017 Neg   Final  . Leukocytes, UA  04/25/2017 Moderate (2+)* Negative Final   Unable to obtain a clean catch     Ct Head Wo Contrast  Result Date: 04/29/2017 CLINICAL DATA:  71 y/o  F; increase forgetfulness and lethargy. EXAM: CT HEAD WITHOUT CONTRAST TECHNIQUE: Contiguous axial images were obtained from the base of the skull through the vertex without intravenous contrast. COMPARISON:  None. FINDINGS: Brain: No evidence of acute infarction, hemorrhage, hydrocephalus, extra-axial collection or mass lesion/mass effect. Moderate chronic microvascular ischemic changes of white matter parenchymal volume loss of the brain. Small chronic lacunar infarcts within the cerebellar hemispheres bilaterally, the left thalamus, and the right caudate head. Vascular: Extensive calcific atherosclerosis of carotid siphons. Skull: Normal. Negative for fracture or focal lesion. Sinuses/Orbits: No acute finding. Other: None. IMPRESSION: 1. No acute intracranial abnormality. 2. Moderate chronic microvascular ischemic changes and parenchymal volume loss of the brain. Small chronic lacunar infarcts in cerebellum and basal ganglia. Electronically Signed   By: Kristine Garbe M.D.   On: 04/29/2017 22:45   Dg Chest Port 1 View  Result Date: 04/28/2017 CLINICAL DATA:  Shortness of breath, acute respiratory failure EXAM: PORTABLE CHEST 1 VIEW COMPARISON:  04/27/2017 FINDINGS: Cardiomegaly with mild interstitial edema. Suspected trace right and small left pleural effusions. No pneumothorax. IMPRESSION: Cardiomegaly with mild interstitial edema, unchanged. Bilateral pleural effusions, left greater than right. Electronically Signed   By: Julian Hy M.D.   On: 04/28/2017 07:20   Dg Chest Port 1 View  Result Date: 04/27/2017 CLINICAL DATA:  Respiratory failure EXAM: PORTABLE CHEST 1 VIEW COMPARISON:  92,018 FINDINGS: Bilateral airspace disease unchanged. Left lower lobe atelectasis and effusion unchanged. Small right effusion. IMPRESSION: No significant  interval change. Probable heart failure with pulmonary edema and pleural effusions. Electronically Signed   By: Franchot Gallo M.D.   On: 04/27/2017 07:19   Dg Chest Port 1 View  Result Date: 04/26/2017 CLINICAL DATA:  Short of breath EXAM: PORTABLE CHEST 1 VIEW COMPARISON:  04/25/2017 FINDINGS: Progression of diffuse bilateral airspace disease compatible with worsening pulmonary edema. Progression of left effusion and left lower lobe atelectasis. IMPRESSION: Worsening heart failure with increase in edema and left effusion. Progression of left lower lobe atelectasis. Electronically Signed   By: Franchot Gallo M.D.   On: 04/26/2017 08:11   Dg Chest Port 1 View  Result Date: 04/25/2017 CLINICAL DATA:  Altered mental status for several weeks, increased agitation. EXAM: PORTABLE CHEST 1 VIEW COMPARISON:  None. FINDINGS: Patchy  bilateral airspace opacities, most prominent at the left lung base. Heart size cannot be characterized as margins are obscured by the overlying airspace opacities. Atherosclerotic changes noted at the aortic arch. No acute or suspicious osseous finding. IMPRESSION: 1. Patchy bilateral airspace opacities, most likely pulmonary edema. 2. Denser opacity at the left lung base could represent pneumonia, atelectasis and/or small pleural effusion. 3.  Aortic atherosclerosis. Electronically Signed   By: Franki Cabot M.D.   On: 04/25/2017 18:03     Assessment/Plan   ICD-10-CM   1. Left breast mass N63.20 MM Digital Diagnostic Unilat L  2. Chronic deep vein thrombosis (DVT) of proximal vein of left lower extremity (HCC) I82.5Y2   3. Bilateral leg edema R60.0   4. Type 2 diabetes mellitus with complication, without long-term current use of insulin (HCC) E11.8   5. Essential hypertension I10   6. Failure to thrive in adult R62.7   7. Memory loss or impairment R41.3   8. Chronic systolic congestive heart failure (HCC) I50.22   9. Aortic valve disorder I35.9   10. Need for immunization  against influenza Z23 Flu Vaccine QUAD 6+ mos PF IM (Fluarix Quad PF)    Will reck LLE venous doppler in 6 mos. If clot resolved, will consider stopping eliquis  WILL CALL WITH MAMMOGRAM APPT  Follow up with cardiology as scheduled  Continue home health PT and nurse  [facility] - please call eye provider to schedule appt  Will discuss glucometer to check Blood sugars at next office visit  Influenza vaccine given today  Will need Prevnar in September 2019  NO DRIVING RECOMMENDED AT THIS TIME  Follow up in 1 month for breast mass, memory impairment, LLE DVT, HF  Dayna Alia S. Perlie Gold  [facility] [address] [place], [place] [address] [phone] Cell (Monday-Friday 8 AM - 5 PM) [phone] After 5 PM and follow prompts

## 2017-05-20 NOTE — Patient Instructions (Addendum)
WILL CALL WITH MAMMOGRAM APPT  Follow up with cardiology as scheduled  Continue home health PT and nurse  [facility] - please call eye provider to schedule appt  Will discuss glucometer to check Blood sugars at next office visit  Influenza vaccine given today  Will need Prevnar in September 2019  Will need to repeat leg [place] in 6 mos to determine if clot resolved  NO DRIVING RECOMMENDED AT THIS TIME  Follow up in 1 month for breast mass, memory impairment, LLE DVT, HF

## 2017-05-21 DIAGNOSIS — I824Y2 Acute embolism and thrombosis of unspecified deep veins of left proximal lower extremity: Secondary | ICD-10-CM | POA: Diagnosis not present

## 2017-05-21 DIAGNOSIS — I5033 Acute on chronic diastolic (congestive) heart failure: Secondary | ICD-10-CM | POA: Diagnosis not present

## 2017-05-21 DIAGNOSIS — E1122 Type 2 diabetes mellitus with diabetic chronic kidney disease: Secondary | ICD-10-CM | POA: Diagnosis not present

## 2017-05-21 DIAGNOSIS — Z7984 Long term (current) use of oral hypoglycemic drugs: Secondary | ICD-10-CM | POA: Diagnosis not present

## 2017-05-21 DIAGNOSIS — Z7901 Long term (current) use of anticoagulants: Secondary | ICD-10-CM | POA: Diagnosis not present

## 2017-05-21 DIAGNOSIS — R413 Other amnesia: Secondary | ICD-10-CM | POA: Insufficient documentation

## 2017-05-21 DIAGNOSIS — M25562 Pain in left knee: Secondary | ICD-10-CM | POA: Diagnosis not present

## 2017-05-21 DIAGNOSIS — I130 Hypertensive heart and chronic kidney disease with heart failure and stage 1 through stage 4 chronic kidney disease, or unspecified chronic kidney disease: Secondary | ICD-10-CM | POA: Diagnosis not present

## 2017-05-21 DIAGNOSIS — N63 Unspecified lump in unspecified breast: Secondary | ICD-10-CM | POA: Diagnosis not present

## 2017-05-21 DIAGNOSIS — M6281 Muscle weakness (generalized): Secondary | ICD-10-CM | POA: Diagnosis not present

## 2017-05-21 DIAGNOSIS — Z9181 History of falling: Secondary | ICD-10-CM | POA: Diagnosis not present

## 2017-05-21 DIAGNOSIS — Z9114 Patient's other noncompliance with medication regimen: Secondary | ICD-10-CM | POA: Diagnosis not present

## 2017-05-21 DIAGNOSIS — N182 Chronic kidney disease, stage 2 (mild): Secondary | ICD-10-CM | POA: Diagnosis not present

## 2017-05-22 ENCOUNTER — Telehealth (HOSPITAL_COMMUNITY): Payer: Self-pay

## 2017-05-22 NOTE — Telephone Encounter (Signed)
Encounter complete. 

## 2017-05-23 DIAGNOSIS — Z7901 Long term (current) use of anticoagulants: Secondary | ICD-10-CM | POA: Diagnosis not present

## 2017-05-23 DIAGNOSIS — Z9181 History of falling: Secondary | ICD-10-CM | POA: Diagnosis not present

## 2017-05-23 DIAGNOSIS — I824Y2 Acute embolism and thrombosis of unspecified deep veins of left proximal lower extremity: Secondary | ICD-10-CM | POA: Diagnosis not present

## 2017-05-23 DIAGNOSIS — N182 Chronic kidney disease, stage 2 (mild): Secondary | ICD-10-CM | POA: Diagnosis not present

## 2017-05-23 DIAGNOSIS — Z7984 Long term (current) use of oral hypoglycemic drugs: Secondary | ICD-10-CM | POA: Diagnosis not present

## 2017-05-23 DIAGNOSIS — M6281 Muscle weakness (generalized): Secondary | ICD-10-CM | POA: Diagnosis not present

## 2017-05-23 DIAGNOSIS — M25562 Pain in left knee: Secondary | ICD-10-CM | POA: Diagnosis not present

## 2017-05-23 DIAGNOSIS — I5033 Acute on chronic diastolic (congestive) heart failure: Secondary | ICD-10-CM | POA: Diagnosis not present

## 2017-05-23 DIAGNOSIS — E1122 Type 2 diabetes mellitus with diabetic chronic kidney disease: Secondary | ICD-10-CM | POA: Diagnosis not present

## 2017-05-23 DIAGNOSIS — N63 Unspecified lump in unspecified breast: Secondary | ICD-10-CM | POA: Diagnosis not present

## 2017-05-23 DIAGNOSIS — I130 Hypertensive heart and chronic kidney disease with heart failure and stage 1 through stage 4 chronic kidney disease, or unspecified chronic kidney disease: Secondary | ICD-10-CM | POA: Diagnosis not present

## 2017-05-23 DIAGNOSIS — Z9114 Patient's other noncompliance with medication regimen: Secondary | ICD-10-CM | POA: Diagnosis not present

## 2017-05-26 DIAGNOSIS — I5033 Acute on chronic diastolic (congestive) heart failure: Secondary | ICD-10-CM | POA: Diagnosis not present

## 2017-05-26 DIAGNOSIS — N63 Unspecified lump in unspecified breast: Secondary | ICD-10-CM | POA: Diagnosis not present

## 2017-05-26 DIAGNOSIS — Z9181 History of falling: Secondary | ICD-10-CM | POA: Diagnosis not present

## 2017-05-26 DIAGNOSIS — I130 Hypertensive heart and chronic kidney disease with heart failure and stage 1 through stage 4 chronic kidney disease, or unspecified chronic kidney disease: Secondary | ICD-10-CM | POA: Diagnosis not present

## 2017-05-26 DIAGNOSIS — I824Y2 Acute embolism and thrombosis of unspecified deep veins of left proximal lower extremity: Secondary | ICD-10-CM | POA: Diagnosis not present

## 2017-05-26 DIAGNOSIS — Z7984 Long term (current) use of oral hypoglycemic drugs: Secondary | ICD-10-CM | POA: Diagnosis not present

## 2017-05-26 DIAGNOSIS — E1122 Type 2 diabetes mellitus with diabetic chronic kidney disease: Secondary | ICD-10-CM | POA: Diagnosis not present

## 2017-05-26 DIAGNOSIS — Z9114 Patient's other noncompliance with medication regimen: Secondary | ICD-10-CM | POA: Diagnosis not present

## 2017-05-26 DIAGNOSIS — M25562 Pain in left knee: Secondary | ICD-10-CM | POA: Diagnosis not present

## 2017-05-26 DIAGNOSIS — M6281 Muscle weakness (generalized): Secondary | ICD-10-CM | POA: Diagnosis not present

## 2017-05-26 DIAGNOSIS — N182 Chronic kidney disease, stage 2 (mild): Secondary | ICD-10-CM | POA: Diagnosis not present

## 2017-05-26 DIAGNOSIS — Z7901 Long term (current) use of anticoagulants: Secondary | ICD-10-CM | POA: Diagnosis not present

## 2017-05-27 DIAGNOSIS — E1122 Type 2 diabetes mellitus with diabetic chronic kidney disease: Secondary | ICD-10-CM | POA: Diagnosis not present

## 2017-05-27 DIAGNOSIS — N182 Chronic kidney disease, stage 2 (mild): Secondary | ICD-10-CM | POA: Diagnosis not present

## 2017-05-27 DIAGNOSIS — Z7901 Long term (current) use of anticoagulants: Secondary | ICD-10-CM | POA: Diagnosis not present

## 2017-05-27 DIAGNOSIS — Z9181 History of falling: Secondary | ICD-10-CM | POA: Diagnosis not present

## 2017-05-27 DIAGNOSIS — N63 Unspecified lump in unspecified breast: Secondary | ICD-10-CM | POA: Diagnosis not present

## 2017-05-27 DIAGNOSIS — M6281 Muscle weakness (generalized): Secondary | ICD-10-CM | POA: Diagnosis not present

## 2017-05-27 DIAGNOSIS — Z9114 Patient's other noncompliance with medication regimen: Secondary | ICD-10-CM | POA: Diagnosis not present

## 2017-05-27 DIAGNOSIS — M25562 Pain in left knee: Secondary | ICD-10-CM | POA: Diagnosis not present

## 2017-05-27 DIAGNOSIS — I5033 Acute on chronic diastolic (congestive) heart failure: Secondary | ICD-10-CM | POA: Diagnosis not present

## 2017-05-27 DIAGNOSIS — I130 Hypertensive heart and chronic kidney disease with heart failure and stage 1 through stage 4 chronic kidney disease, or unspecified chronic kidney disease: Secondary | ICD-10-CM | POA: Diagnosis not present

## 2017-05-27 DIAGNOSIS — Z7984 Long term (current) use of oral hypoglycemic drugs: Secondary | ICD-10-CM | POA: Diagnosis not present

## 2017-05-27 DIAGNOSIS — I824Y2 Acute embolism and thrombosis of unspecified deep veins of left proximal lower extremity: Secondary | ICD-10-CM | POA: Diagnosis not present

## 2017-05-28 ENCOUNTER — Ambulatory Visit (HOSPITAL_COMMUNITY)
Admission: RE | Admit: 2017-05-28 | Discharge: 2017-05-28 | Disposition: A | Discharge status: Home / Self Care | Payer: Medicare Other | Source: Ambulatory Visit | Attending: Internal Medicine | Admitting: Internal Medicine

## 2017-05-28 DIAGNOSIS — I359 Nonrheumatic aortic valve disorder, unspecified: Secondary | ICD-10-CM | POA: Diagnosis not present

## 2017-05-28 DIAGNOSIS — F0390 Unspecified dementia without behavioral disturbance: Secondary | ICD-10-CM | POA: Diagnosis not present

## 2017-05-28 DIAGNOSIS — Z8249 Family history of ischemic heart disease and other diseases of the circulatory system: Secondary | ICD-10-CM | POA: Diagnosis not present

## 2017-05-28 DIAGNOSIS — R627 Adult failure to thrive: Secondary | ICD-10-CM | POA: Insufficient documentation

## 2017-05-28 DIAGNOSIS — I509 Heart failure, unspecified: Secondary | ICD-10-CM | POA: Diagnosis not present

## 2017-05-28 DIAGNOSIS — R9439 Abnormal result of other cardiovascular function study: Secondary | ICD-10-CM | POA: Diagnosis not present

## 2017-05-28 DIAGNOSIS — E119 Type 2 diabetes mellitus without complications: Secondary | ICD-10-CM | POA: Diagnosis not present

## 2017-05-28 DIAGNOSIS — N182 Chronic kidney disease, stage 2 (mild): Secondary | ICD-10-CM | POA: Diagnosis not present

## 2017-05-28 DIAGNOSIS — I130 Hypertensive heart and chronic kidney disease with heart failure and stage 1 through stage 4 chronic kidney disease, or unspecified chronic kidney disease: Secondary | ICD-10-CM | POA: Diagnosis not present

## 2017-05-28 DIAGNOSIS — E1122 Type 2 diabetes mellitus with diabetic chronic kidney disease: Secondary | ICD-10-CM | POA: Diagnosis not present

## 2017-05-28 DIAGNOSIS — Z86718 Personal history of other venous thrombosis and embolism: Secondary | ICD-10-CM | POA: Insufficient documentation

## 2017-05-28 LAB — MYOCARDIAL PERFUSION IMAGING
CHL CUP NUCLEAR SDS: 4
CHL CUP NUCLEAR SSS: 4
LV dias vol: 213 mL (ref 46–106)
LV sys vol: 126 mL
NUC STRESS TID: 1.04
Peak HR: 92 {beats}/min
Rest HR: 72 {beats}/min
SRS: 0

## 2017-05-28 MED ORDER — TECHNETIUM TC 99M TETROFOSMIN IV KIT
10.4000 | PACK | Freq: Once | INTRAVENOUS | Status: AC | PRN
  Administered 2017-05-28: 10.4 via INTRAVENOUS
  Filled 2017-05-28: qty 11

## 2017-05-28 MED ORDER — TECHNETIUM TC 99M TETROFOSMIN IV KIT
28.2000 | PACK | Freq: Once | INTRAVENOUS | Status: AC | PRN
  Administered 2017-05-28: 28.2 via INTRAVENOUS
  Filled 2017-05-28: qty 29

## 2017-05-28 MED ORDER — REGADENOSON 0.4 MG/5ML IV SOLN
0.4000 mg | Freq: Once | INTRAVENOUS | Status: AC
  Administered 2017-05-28: 0.4 mg via INTRAVENOUS

## 2017-05-29 DIAGNOSIS — I5033 Acute on chronic diastolic (congestive) heart failure: Secondary | ICD-10-CM | POA: Diagnosis not present

## 2017-05-29 DIAGNOSIS — M25562 Pain in left knee: Secondary | ICD-10-CM | POA: Diagnosis not present

## 2017-05-29 DIAGNOSIS — M6281 Muscle weakness (generalized): Secondary | ICD-10-CM | POA: Diagnosis not present

## 2017-05-29 DIAGNOSIS — E1122 Type 2 diabetes mellitus with diabetic chronic kidney disease: Secondary | ICD-10-CM | POA: Diagnosis not present

## 2017-05-29 DIAGNOSIS — Z7984 Long term (current) use of oral hypoglycemic drugs: Secondary | ICD-10-CM | POA: Diagnosis not present

## 2017-05-29 DIAGNOSIS — N182 Chronic kidney disease, stage 2 (mild): Secondary | ICD-10-CM | POA: Diagnosis not present

## 2017-05-29 DIAGNOSIS — Z7901 Long term (current) use of anticoagulants: Secondary | ICD-10-CM | POA: Diagnosis not present

## 2017-05-29 DIAGNOSIS — Z9114 Patient's other noncompliance with medication regimen: Secondary | ICD-10-CM | POA: Diagnosis not present

## 2017-05-29 DIAGNOSIS — Z9181 History of falling: Secondary | ICD-10-CM | POA: Diagnosis not present

## 2017-05-29 DIAGNOSIS — I824Y2 Acute embolism and thrombosis of unspecified deep veins of left proximal lower extremity: Secondary | ICD-10-CM | POA: Diagnosis not present

## 2017-05-29 DIAGNOSIS — I130 Hypertensive heart and chronic kidney disease with heart failure and stage 1 through stage 4 chronic kidney disease, or unspecified chronic kidney disease: Secondary | ICD-10-CM | POA: Diagnosis not present

## 2017-05-29 DIAGNOSIS — N63 Unspecified lump in unspecified breast: Secondary | ICD-10-CM | POA: Diagnosis not present

## 2017-06-02 ENCOUNTER — Telehealth: Payer: Self-pay | Admitting: Internal Medicine

## 2017-06-02 DIAGNOSIS — E1122 Type 2 diabetes mellitus with diabetic chronic kidney disease: Secondary | ICD-10-CM | POA: Diagnosis not present

## 2017-06-02 DIAGNOSIS — N182 Chronic kidney disease, stage 2 (mild): Secondary | ICD-10-CM | POA: Diagnosis not present

## 2017-06-02 DIAGNOSIS — I824Y2 Acute embolism and thrombosis of unspecified deep veins of left proximal lower extremity: Secondary | ICD-10-CM | POA: Diagnosis not present

## 2017-06-02 DIAGNOSIS — I5033 Acute on chronic diastolic (congestive) heart failure: Secondary | ICD-10-CM | POA: Diagnosis not present

## 2017-06-02 DIAGNOSIS — Z9181 History of falling: Secondary | ICD-10-CM | POA: Diagnosis not present

## 2017-06-02 DIAGNOSIS — Z7901 Long term (current) use of anticoagulants: Secondary | ICD-10-CM | POA: Diagnosis not present

## 2017-06-02 DIAGNOSIS — Z7984 Long term (current) use of oral hypoglycemic drugs: Secondary | ICD-10-CM | POA: Diagnosis not present

## 2017-06-02 DIAGNOSIS — M6281 Muscle weakness (generalized): Secondary | ICD-10-CM | POA: Diagnosis not present

## 2017-06-02 DIAGNOSIS — I130 Hypertensive heart and chronic kidney disease with heart failure and stage 1 through stage 4 chronic kidney disease, or unspecified chronic kidney disease: Secondary | ICD-10-CM | POA: Diagnosis not present

## 2017-06-02 DIAGNOSIS — M25562 Pain in left knee: Secondary | ICD-10-CM | POA: Diagnosis not present

## 2017-06-02 DIAGNOSIS — Z9114 Patient's other noncompliance with medication regimen: Secondary | ICD-10-CM | POA: Diagnosis not present

## 2017-06-02 DIAGNOSIS — N63 Unspecified lump in unspecified breast: Secondary | ICD-10-CM | POA: Diagnosis not present

## 2017-06-02 NOTE — Telephone Encounter (Signed)
Langley Gauss, home health nurse, went out to see Mrs. Charlene Rubio.  I was covering the office while the electricity was out.  Pt and her husband had not had electricity until late last night and had been eating fast food for several days.  Her weight trended up 3 lbs since Langley Gauss saw her last Wednesday, but had gone down 0.2 lbs from yesterday.  Lungs were completely clear.  Legs had chronic 2+ edema, but no increase.  She had no shortness of breath and POX 99-100% walking around her home.  She is on scheduled lasix 60mg  daily.  B/c she was asymptomatic and due to above, no change in lasix was made at this time.  Langley Gauss counseled husband to get back to cooking so sodium in diet could be controlled and he was agreeable.  Nurse was just calling to let us know.  Appears pt follows with Dr. Claiborne Billings from cardiology, also.  I recommended that if weight continues to trend up or new symptoms unfold, that [facility] call us back for more orders.  Will send message to [name] as Juluis Rainier also.    Graylee Arutyunyan L. Taneika Choi, D.O. [facility] [address], [place] [address] Cell Phone (Mon-Fri 8am-5pm):  [phone] On Call:  [phone] & follow prompts after 5pm & weekends Office Phone:  [phone] Office Fax:  [phone]

## 2017-06-04 DIAGNOSIS — I5033 Acute on chronic diastolic (congestive) heart failure: Secondary | ICD-10-CM | POA: Diagnosis not present

## 2017-06-04 DIAGNOSIS — M25562 Pain in left knee: Secondary | ICD-10-CM | POA: Diagnosis not present

## 2017-06-04 DIAGNOSIS — Z9181 History of falling: Secondary | ICD-10-CM | POA: Diagnosis not present

## 2017-06-04 DIAGNOSIS — Z7984 Long term (current) use of oral hypoglycemic drugs: Secondary | ICD-10-CM | POA: Diagnosis not present

## 2017-06-04 DIAGNOSIS — E1122 Type 2 diabetes mellitus with diabetic chronic kidney disease: Secondary | ICD-10-CM | POA: Diagnosis not present

## 2017-06-04 DIAGNOSIS — Z7901 Long term (current) use of anticoagulants: Secondary | ICD-10-CM | POA: Diagnosis not present

## 2017-06-04 DIAGNOSIS — I130 Hypertensive heart and chronic kidney disease with heart failure and stage 1 through stage 4 chronic kidney disease, or unspecified chronic kidney disease: Secondary | ICD-10-CM | POA: Diagnosis not present

## 2017-06-04 DIAGNOSIS — M6281 Muscle weakness (generalized): Secondary | ICD-10-CM | POA: Diagnosis not present

## 2017-06-04 DIAGNOSIS — I824Y2 Acute embolism and thrombosis of unspecified deep veins of left proximal lower extremity: Secondary | ICD-10-CM | POA: Diagnosis not present

## 2017-06-04 DIAGNOSIS — N182 Chronic kidney disease, stage 2 (mild): Secondary | ICD-10-CM | POA: Diagnosis not present

## 2017-06-04 DIAGNOSIS — Z9114 Patient's other noncompliance with medication regimen: Secondary | ICD-10-CM | POA: Diagnosis not present

## 2017-06-04 DIAGNOSIS — N63 Unspecified lump in unspecified breast: Secondary | ICD-10-CM | POA: Diagnosis not present

## 2017-06-05 ENCOUNTER — Telehealth: Payer: Self-pay

## 2017-06-05 DIAGNOSIS — E1122 Type 2 diabetes mellitus with diabetic chronic kidney disease: Secondary | ICD-10-CM | POA: Diagnosis not present

## 2017-06-05 DIAGNOSIS — Z7901 Long term (current) use of anticoagulants: Secondary | ICD-10-CM | POA: Diagnosis not present

## 2017-06-05 DIAGNOSIS — Z9181 History of falling: Secondary | ICD-10-CM | POA: Diagnosis not present

## 2017-06-05 DIAGNOSIS — I824Y2 Acute embolism and thrombosis of unspecified deep veins of left proximal lower extremity: Secondary | ICD-10-CM | POA: Diagnosis not present

## 2017-06-05 DIAGNOSIS — M6281 Muscle weakness (generalized): Secondary | ICD-10-CM | POA: Diagnosis not present

## 2017-06-05 DIAGNOSIS — M25562 Pain in left knee: Secondary | ICD-10-CM | POA: Diagnosis not present

## 2017-06-05 DIAGNOSIS — N182 Chronic kidney disease, stage 2 (mild): Secondary | ICD-10-CM | POA: Diagnosis not present

## 2017-06-05 DIAGNOSIS — Z7984 Long term (current) use of oral hypoglycemic drugs: Secondary | ICD-10-CM | POA: Diagnosis not present

## 2017-06-05 DIAGNOSIS — Z9114 Patient's other noncompliance with medication regimen: Secondary | ICD-10-CM | POA: Diagnosis not present

## 2017-06-05 DIAGNOSIS — I130 Hypertensive heart and chronic kidney disease with heart failure and stage 1 through stage 4 chronic kidney disease, or unspecified chronic kidney disease: Secondary | ICD-10-CM | POA: Diagnosis not present

## 2017-06-05 DIAGNOSIS — I5033 Acute on chronic diastolic (congestive) heart failure: Secondary | ICD-10-CM | POA: Diagnosis not present

## 2017-06-05 DIAGNOSIS — N63 Unspecified lump in unspecified breast: Secondary | ICD-10-CM | POA: Diagnosis not present

## 2017-06-05 NOTE — Telephone Encounter (Signed)
Denis with [facility] called to inform Dr.Carter that patient's nursing services will end today and physical therapy services will continue.  FYI only, no action required

## 2017-06-06 ENCOUNTER — Other Ambulatory Visit: Payer: Self-pay | Admitting: Internal Medicine

## 2017-06-06 DIAGNOSIS — N6320 Unspecified lump in the left breast, unspecified quadrant: Secondary | ICD-10-CM

## 2017-06-09 DIAGNOSIS — E1122 Type 2 diabetes mellitus with diabetic chronic kidney disease: Secondary | ICD-10-CM | POA: Diagnosis not present

## 2017-06-09 DIAGNOSIS — M6281 Muscle weakness (generalized): Secondary | ICD-10-CM | POA: Diagnosis not present

## 2017-06-09 DIAGNOSIS — Z7901 Long term (current) use of anticoagulants: Secondary | ICD-10-CM | POA: Diagnosis not present

## 2017-06-09 DIAGNOSIS — I5033 Acute on chronic diastolic (congestive) heart failure: Secondary | ICD-10-CM | POA: Diagnosis not present

## 2017-06-09 DIAGNOSIS — M25562 Pain in left knee: Secondary | ICD-10-CM | POA: Diagnosis not present

## 2017-06-09 DIAGNOSIS — N63 Unspecified lump in unspecified breast: Secondary | ICD-10-CM | POA: Diagnosis not present

## 2017-06-09 DIAGNOSIS — I130 Hypertensive heart and chronic kidney disease with heart failure and stage 1 through stage 4 chronic kidney disease, or unspecified chronic kidney disease: Secondary | ICD-10-CM | POA: Diagnosis not present

## 2017-06-09 DIAGNOSIS — Z9181 History of falling: Secondary | ICD-10-CM | POA: Diagnosis not present

## 2017-06-09 DIAGNOSIS — N182 Chronic kidney disease, stage 2 (mild): Secondary | ICD-10-CM | POA: Diagnosis not present

## 2017-06-09 DIAGNOSIS — I824Y2 Acute embolism and thrombosis of unspecified deep veins of left proximal lower extremity: Secondary | ICD-10-CM | POA: Diagnosis not present

## 2017-06-09 DIAGNOSIS — Z9114 Patient's other noncompliance with medication regimen: Secondary | ICD-10-CM | POA: Diagnosis not present

## 2017-06-09 DIAGNOSIS — Z7984 Long term (current) use of oral hypoglycemic drugs: Secondary | ICD-10-CM | POA: Diagnosis not present

## 2017-06-11 ENCOUNTER — Ambulatory Visit (INDEPENDENT_AMBULATORY_CARE_PROVIDER_SITE_OTHER): Payer: Medicare Other | Admitting: Cardiovascular Disease

## 2017-06-11 ENCOUNTER — Encounter: Payer: Self-pay | Admitting: Cardiovascular Disease

## 2017-06-11 VITALS — BP 151/74 | HR 89 | Ht 60.0 in | Wt 157.0 lb

## 2017-06-11 DIAGNOSIS — D696 Thrombocytopenia, unspecified: Secondary | ICD-10-CM

## 2017-06-11 DIAGNOSIS — Z7984 Long term (current) use of oral hypoglycemic drugs: Secondary | ICD-10-CM | POA: Diagnosis not present

## 2017-06-11 DIAGNOSIS — E1122 Type 2 diabetes mellitus with diabetic chronic kidney disease: Secondary | ICD-10-CM | POA: Diagnosis not present

## 2017-06-11 DIAGNOSIS — Z9181 History of falling: Secondary | ICD-10-CM | POA: Diagnosis not present

## 2017-06-11 DIAGNOSIS — I130 Hypertensive heart and chronic kidney disease with heart failure and stage 1 through stage 4 chronic kidney disease, or unspecified chronic kidney disease: Secondary | ICD-10-CM | POA: Diagnosis not present

## 2017-06-11 DIAGNOSIS — M25562 Pain in left knee: Secondary | ICD-10-CM | POA: Diagnosis not present

## 2017-06-11 DIAGNOSIS — I5042 Chronic combined systolic (congestive) and diastolic (congestive) heart failure: Secondary | ICD-10-CM | POA: Diagnosis not present

## 2017-06-11 DIAGNOSIS — Z9114 Patient's other noncompliance with medication regimen: Secondary | ICD-10-CM | POA: Diagnosis not present

## 2017-06-11 DIAGNOSIS — I5033 Acute on chronic diastolic (congestive) heart failure: Secondary | ICD-10-CM | POA: Diagnosis not present

## 2017-06-11 DIAGNOSIS — I428 Other cardiomyopathies: Secondary | ICD-10-CM

## 2017-06-11 DIAGNOSIS — M6281 Muscle weakness (generalized): Secondary | ICD-10-CM | POA: Diagnosis not present

## 2017-06-11 DIAGNOSIS — F0390 Unspecified dementia without behavioral disturbance: Secondary | ICD-10-CM

## 2017-06-11 DIAGNOSIS — N182 Chronic kidney disease, stage 2 (mild): Secondary | ICD-10-CM | POA: Diagnosis not present

## 2017-06-11 DIAGNOSIS — Z5181 Encounter for therapeutic drug level monitoring: Secondary | ICD-10-CM

## 2017-06-11 DIAGNOSIS — I825Z2 Chronic embolism and thrombosis of unspecified deep veins of left distal lower extremity: Secondary | ICD-10-CM | POA: Diagnosis not present

## 2017-06-11 DIAGNOSIS — I824Y2 Acute embolism and thrombosis of unspecified deep veins of left proximal lower extremity: Secondary | ICD-10-CM | POA: Diagnosis not present

## 2017-06-11 DIAGNOSIS — Z7901 Long term (current) use of anticoagulants: Secondary | ICD-10-CM

## 2017-06-11 DIAGNOSIS — N63 Unspecified lump in unspecified breast: Secondary | ICD-10-CM | POA: Diagnosis not present

## 2017-06-11 MED ORDER — LISINOPRIL 10 MG PO TABS: 10.0000 mg | ORAL_TABLET | Freq: Every day | ORAL | 3 refills | Status: DC

## 2017-06-11 MED ORDER — CARVEDILOL 6.25 MG PO TABS: 6.2500 mg | ORAL_TABLET | Freq: Two times a day (BID) | ORAL | 3 refills | Status: DC

## 2017-06-11 MED ORDER — FUROSEMIDE 40 MG PO TABS: 60.0000 mg | ORAL_TABLET | Freq: Every day | ORAL | 3 refills | Status: DC

## 2017-06-11 MED ORDER — APIXABAN 5 MG PO TABS: 5.0000 mg | ORAL_TABLET | Freq: Two times a day (BID) | ORAL | 11 refills | Status: DC

## 2017-06-11 MED ORDER — METFORMIN HCL ER 500 MG PO TB24: 500.0000 mg | ORAL_TABLET | Freq: Two times a day (BID) | ORAL | 0 refills | Status: DC

## 2017-06-11 MED ORDER — ISOSORBIDE MONONITRATE ER 30 MG PO TB24: 15.0000 mg | ORAL_TABLET | Freq: Every day | ORAL | 3 refills | Status: DC

## 2017-06-11 NOTE — Patient Instructions (Addendum)
Medication Instructions:  INCREASE YOUR CARVEDILOL TO 6.25 MG TWICE A DAY   INCREASE YOUR LISINOPRIL TO 10 MG DAILY   Labwork: BMET IN 3 WEEKS  Testing/Procedures: NONE  Follow-Up: Your physician recommends that you schedule a follow-up appointment in: 3-4 MONTH OV   Any Other Special Instructions Will Be Listed Below (If Applicable). USE THE SUPPORT STOCKINGS DURING THE DAY   METFORMIN WAS REFILLED THIS TIME ONLY BUT REFILLS WILL NEED TO COME FROM YOUR PRIMARY CARE PHYSICIAN   If you need a refill on your cardiac medications before your next appointment, please call your pharmacy.

## 2017-06-11 NOTE — Progress Notes (Signed)
Cardiology Office Note    Date:  06/11/2017   ID:  Charlene Rubio, DOB 09-04-1945, MRN [id number]  PCP:  [name], DO  Cardiologist:  [name], MD   New cardiology consultation per request of Irwin Brakeman, M.D.  History of Present Illness:  Charlene Rubio is a 71 y.o. female who was referred through the courtesy of Dr. [name] in follow-up of her recent hospitalization.  I initially saw her on 05/07/2017.  She presents for follow-up evaluation.  Charlene Rubio has not seen a physician for over the past 5 years and has not been on any medications.  Over this time.  She has been having issues with memory and has suspected dementia.  She has a prior history of hypertension, but this has not been treated.  She was recently admitted to [facility] on 04/25/2017 after having at least a three-week history of progressive lower extremity edema and increasing shortness of breath.  She was felt initially to have acute hypoxic respiratory failure with pulmonary edema and CHF.  She required initial BiPAP support and aggressive diuretic therapy.  An echo Doppler study was done during her hospitalization which showed an EF of 25-30%.  There was grade 1 diastolic dysfunction.  There was moderate aortic insufficiency, mild mitral insufficiency, mild tricuspid insufficiency and there was mild left atrial dilatation.  There was mild pulmonary hypertension.  A venous Doppler study did suggest a chronic DVT in the left lower extremity.  She was started on eliquis per pharmacy and support stockings.  She was significantly hyperglycemic on admission with a glucose of 428 and a hemoglobin A1c elevated at 7.8.  She was also found to have a non-tender breast mass.  She was discharged on 05/01/2017 on eliquis 5 mg twice a day, carvedilol 3.125 mg twice a day, furosemide 40 mg daily, isosorbide 15 mg, lisinopril 2.5 mg, and metformin 500 mg twice a day.    When I saw her for initial evaluation.  She had  significant lower extremity edema and I recommended she change her furosemide to 40 mg twice a day for 3 days and then to 60 mg daily.  With her current myopathy.  I initiated lisinopril at 5 mg daily.  I recommended support stockings.  I scheduled her for [place] study to evaluate potential ischemic etiology to her LV dysfunction.  This was done on over 10 2018 and was intermediate risk due to an EF of 41%.  There was no significant perfusion abnormalities with only a small fixed mild basal inferior defect felt to be attenuation artifact.  There was no ischemia.  Over the past month, she denies any episodes of chest pain.  Her edema has improved but she continues to have leg swelling and his not been using support stockings.  She is scheduled to undergo imaging at the breast center in follow-up of her breast mask.  She will also be undergoing a new patient neurology evaluation due to her suspected dementia.  She denies chest pain or palpitations.  She denies PND, orthopnea.  She presents for evaluation.  Past Medical History:  Diagnosis Date  . Acute diastolic (congestive) heart failure ([place]) 04/25/2017  . Acute respiratory failure with hypoxia and hypercapnia ([place]) 04/25/2017  . ALLERGIC RHINITIS 07/01/2007   Qualifier: Diagnosis of  By: Sherren Mocha MD, Jory Ee   . Allergy   . Aortic insufficiency   . Aortic valve disorder 07/01/2007   Qualifier: Diagnosis of  By: Sherren Mocha MD, Dellis Filbert  A   . Bilateral leg edema 04/25/2017  . Borderline diabetic   . CHF (congestive heart failure) ([place]) 04/25/2017  . Dementia 04/30/2017  . Diabetes mellitus ([place]) 04/25/2017  . DOE (dyspnea on exertion) 04/25/2017  . Essential hypertension 07/01/2007   Qualifier: Diagnosis of  By: Sherren Mocha MD, Jory Ee Glaucoma suspect   . Hypertension   . KNEE PAIN, LEFT 04/06/2010   Qualifier: Diagnosis of  By: Sherren Mocha MD, Jory Ee   . Obesity   . Obesity, unspecified 07/01/2007   Qualifier: Diagnosis of  By: Sherren Mocha MD, Jory Ee   .  Thyroid disease     No past surgical history on file.  Current Medications: Outpatient Medications Prior to Visit  Medication Sig Dispense Refill  . aspirin EC 81 MG EC tablet Take 1 tablet (81 mg total) by mouth daily.    Marland Kitchen apixaban (ELIQUIS) 5 MG TABS tablet Take 5 mg by mouth. Take one tablet daily    . carvedilol (COREG) 3.125 MG tablet Take 1 tablet (3.125 mg total) by mouth 2 (two) times daily with a meal.    . furosemide (LASIX) 40 MG tablet Take 1.5 tablets (60 mg total) by mouth daily. 30 tablet   . isosorbide mononitrate (IMDUR) 30 MG 24 hr tablet Take 0.5 tablets (15 mg total) by mouth daily.    Marland Kitchen lisinopril (PRINIVIL,ZESTRIL) 5 MG tablet Take 1 tablet (5 mg total) by mouth daily.    . metFORMIN (GLUCOPHAGE-XR) 500 MG 24 hr tablet Take 1 tablet (500 mg total) by mouth 2 (two) times daily with a meal.     No facility-administered medications prior to visit.      Allergies:   Patient has no known allergies.   Social History   Social History  . Marital status: Married    Spouse name: N/A  . Number of children: N/A  . Years of education: N/A   Occupational History  . retired Building control surveyor    Social History Main Topics  . Smoking status: Never Smoker  . Smokeless tobacco: Never Used  . Alcohol use No  . Drug use: No  . Sexual activity: Not Currently   Other Topics Concern  . Not on file   Social History Narrative   Social History   Diet?    Do you drink/eat things with caffeine? Yes (minimal) coke   Marital status?          married                          What year were you married? 1968   Do you live in a house, apartment, assisted living, condo, trailer, etc.? house   Is it one or more stories? one   How many persons live in your home? 2   Do you have any pets in your home? (please list) none   Highest level of education completed?   Current or past profession: preschool teacher/nanny   Do you exercise?         0                             Type & how often?     Advanced Directives   Do you have a living will?   Do you have a DNR form?  If not, do you want to discuss one?   Do you have signed POA/HPOA for forms?    Functional Status   Do you have difficulty bathing or dressing yourself?   Do you have difficulty preparing food or eating?    Do you have difficulty managing your medications?   Do you have difficulty managing your finances?   Do you have difficulty affording your medications?    Admitted to [facility] 05/01/17   Never smoked   Alcohol none   DNR          Additional social history is notable that she is married for 50 years.  She has 3 children, 6 grandchildren.  There is no history of tobacco or alcohol use.  She does not exercise.  Family History:  The patient's family history includes Alcoholism in her brother and brother; Alzheimer's disease in her brother; Alzheimer's disease (age of onset: 74) in her father and mother; Congestive Heart Failure in her father; Dementia in her brother; Diabetes in her brother.   ROS General: Negative; No fevers, chills, or night sweats;  HEENT: Negative; No changes in vision or hearing, sinus congestion, difficulty swallowing Pulmonary: Shortness of breath with activity Cardiovascular: See history of present illness Continued leg swelling GI: Negative; No nausea, vomiting, diarrhea, or abdominal pain GU: Negative; No dysuria, hematuria, or difficulty voiding Musculoskeletal: Negative; no myalgias, joint pain, or weakness Hematologic/Oncology: Chronic DVT;  breast mass Endocrine: Negative; no heat/cold intolerance; no diabetes Neuro: Suspected dementia Skin: Negative; No rashes or skin lesions Psychiatric: Negative; No behavioral problems, depression Sleep: Negative; No snoring, daytime sleepiness, hypersomnolence, bruxism, restless legs, hypnogognic hallucinations, no cataplexy Other comprehensive 14 point system review is negative.   PHYSICAL  EXAM:   VS:  BP (!) 151/74   Pulse 89   Ht 5' (1.524 m)   Wt 157 lb (71.2 kg)   LMP  (LMP Unknown)   BMI 30.66 kg/m     Repeat blood pressure by me was 144/76  Wt Readings from Last 3 Encounters:  06/11/17 157 lb (71.2 kg)  05/28/17 169 lb (76.7 kg)  05/20/17 159 lb 6.4 oz (72.3 kg)    General: Alert, oriented, no distress.  Skin: normal turgor, no rashes, warm and dry HEENT: Normocephalic, atraumatic. Pupils equal round and reactive to light; sclera anicteric; extraocular muscles intact;  Nose without nasal septal hypertrophy Mouth/Parynx benign; Mallinpatti scale 3 Neck: No JVD, no carotid bruits; normal carotid upstroke Lungs: clear to ausculatation and percussion; no wheezing or rales Chest wall: without tenderness to palpitation Heart: PMI not displaced, RRR, s1 s2 normal, 1/6 systolic murmur, no diastolic murmur, no rubs, gallops, thrills, or heaves Abdomen: soft, nontender; no hepatosplenomehaly, BS+; abdominal aorta nontender and not dilated by palpation. Back: no CVA tenderness Pulses 2+ Musculoskeletal: full range of motion, normal strength, no joint deformities Extremities: 1.  A 2+ bilateral lower extremity edema extending to the pretibial region; no clubbing cyanosis, Homan's sign negative  Neurologic: grossly nonfocal; Cranial nerves grossly wnl Psychologic: Flat affect   Studies/Labs Reviewed:   EKG:  EKG is ordered today.  ECG (independently read by me): Normal sinus rhythm at 89 bpm.  LVH with repolarization changes.  QTc interval 455 ms.  05/13/2017 ECG (independently read by me): normal sinus rhythm at 74 bpm.  Borderline LVH.  Nonspecific ST-T changes.  Baseline artifact.   Recent Labs: BMP Latest Ref Rng & Units 05/09/2017 05/06/2017 04/30/2017  Glucose 65 - 99 mg/dL - - 133(H)  BUN 4 - _0 21(H)  Creatinine 0.5 - 1.1 0.4(A) 0.6 0.89  Sodium 137 - 147 144 142 138  Potassium 3.4 - 5.3 3.8 4.5 3.9  Chloride 101 - 111 mmol/L - - 96(L)  CO2 22 - 32  mmol/L - - 33(H)  Calcium 8.9 - 10.3 mg/dL - - 8.2(L)     Hepatic Function Latest Ref Rng & Units 05/09/2017 04/30/2017 04/27/2017  Total Protein 6.5 - 8.1 g/dL - 5.1(L) 4.7(L)  Albumin 3.5 - 5.0 g/dL - 3.0(L) 2.8(L)  AST 13 - 35 _1 ALT 7 - 35 17 28 34  Alk Phosphatase 25 - 125 72 45 46  Total Bilirubin 0.3 - 1.2 mg/dL - 1.3(H) 1.3(H)  Bilirubin, Direct 0.1 - 0.5 mg/dL - 0.3 -    CBC Latest Ref Rng & Units 05/09/2017 05/06/2017 04/30/2017  WBC - 5.0 4.7 9.1  Hemoglobin 12.0 - 16.0 14.0 14.7 16.7(H)  Hematocrit 36 - 46 44 46 49.8(H)  Platelets 150 - 399 139(A) 161 98(L)   Lab Results  Component Value Date   MCV 96.1 04/30/2017   MCV 96.5 04/29/2017   MCV 95.4 04/28/2017   Lab Results  Component Value Date   TSH 4.03 05/09/2017   Lab Results  Component Value Date   HGBA1C 7.8 (H) 04/26/2017     BNP    Component Value Date/Time   BNP 1,411.7 (H) 04/28/2017 0219    ProBNP No results found for: PROBNP   Lipid Panel     Component Value Date/Time   CHOL 170 05/09/2017   TRIG 117 05/09/2017   HDL 52 05/09/2017   CHOLHDL 4 08/19/2011 1038   VLDL 25.4 08/19/2011 1038   LDLCALC 95 05/09/2017     RADIOLOGY: No results found.   Additional studies/ records that were reviewed today include:  I reviewed the patient's recent hospitalization records.  ------------------------------------------------------------------- 04/26/2017 Echo Study Conclusions  - Left ventricle: The cavity size was mildly dilated. There was   mild concentric hypertrophy. Systolic function was severely   reduced. The estimated ejection fraction was in the range of 25%   to 30%. Wall motion was normal; there were no regional wall   motion abnormalities. Doppler parameters are consistent with   abnormal left ventricular relaxation (grade 1 diastolic   dysfunction). - Aortic valve: There was moderate regurgitation. - Mitral valve: There was mild regurgitation. - Left atrium: The atrium  was mildly dilated. - Tricuspid valve: There was mild regurgitation. - Pulmonary arteries: Systolic pressure was mildly increased. PA   peak pressure: 36 mm Hg (S). 04/26/2017 echo  ------------------------------------------------------------------- 04/30/2017 lower extremity venous Doppler Summary:  - No evidence of deep vein thrombosis involving the visualized   veins of the right lower extremity. - Findings consistent with chronic deep vein thrombosis involving   the left distal femoral, popliteal, posterior tibial and peroneal   veins of the left lower extremity. - Incidental findings are consistent with: a very small Baker&'s   Cyst on the right.  ASSESSMENT:    1. Therapeutic drug monitoring   2. Chronic combined systolic and diastolic heart failure ([place])   3. Nonischemic cardiomyopathy ([place])   4. 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 Dementia without behavioral disturbance, unspecified dementia type   6. Thrombocytopenia ([place])   7. Anticoagulation adequate      PLAN:  Charlene Rubio is a 71 year old female who has suspected dementia and  will be undergoing compressive neurologic evaluation next month.  She had developed several weeks of lower extremity edema and increasing shortness of breath leading to her hospitalization.  She was found to have severe LV dysfunction with an EF of 25-30% with moderate AR, mild MR and TR, and mild pulmonary hypertension.  She was felt to have acute combined systolic and diastolic heart failure.  She required initial BiPAP therapy was treated with diuretic therapy.  Because of chronic DVT she was started on anticoagulation with eliquis.  She is tolerating this without bleeding.  When I initially saw her, she was having significant lower extremity edema.  This has improved on her current dose of furosemide 60 mg but she continues to have some edema despite this.  She has a cardiomyopathy and her  nuclear perfusion study was reviewed with her in detail.  This did not reveal any significant perfusion abnormality but global EF post stress was 41%.  She is not well beta blocked and continues to have a pulse today in the 90s.  I'm increasing carvedilol to 6.25 mg twice a day.  Her blood pressure is elevated and I further titrating lisinopril to 10 mg daily.  I have recommended she wear the support stockings almost every day if at all possible.  If she continues to have increasing edema despite increased ACE inhibition and her nadolol affecting heart function, she may then require additional diuretic adjustment.  Again discussed the importance of sodium restriction.  During her hospitalization she was diagnosed with diabetes mellitus and is on metformin.  She is running out of this prescription.  I will renew this today, but she needs to be established for improved glucose.  I reviewed recent laboratory.  TSH is normal at 4.03.  Hemoglobin and hematocrit are stable, although there is mild thrombocytopenia which has improved 2, platelet count 2 of 139 at last check.  LFTs were normal.  LDL cholesterol 1 month ago was 95.  This will need to be followed and if this continues to be elevated, particularly with diabetes.  She should be started on statin therapy.  She will be undergoing more imaging of her breast mass.  She will be undergoing neurologic neurologic evaluation for her probable dementia.  She will be following up with her primary M.D. for diabetic management.  I will see her in 3-4 months for follow-up cartilaginous evaluation.   Medication Adjustments/Labs and Tests Ordered: Current medicines are reviewed at length with the patient today.  Concerns regarding medicines are outlined above.  Medication changes, Labs and Tests ordered today are listed in the Patient Instructions below. Patient Instructions  Medication Instructions:  INCREASE YOUR CARVEDILOL TO 6.25 MG TWICE A DAY   INCREASE YOUR  LISINOPRIL TO 10 MG DAILY   Labwork: BMET IN 3 WEEKS  Testing/Procedures: NONE  Follow-Up: Your physician recommends that you schedule a follow-up appointment in: 3-4 MONTH OV   Any Other Special Instructions Will Be Listed Below (If Applicable). USE THE SUPPORT STOCKINGS DURING THE DAY   METFORMIN WAS REFILLED THIS TIME ONLY BUT REFILLS WILL NEED TO COME FROM YOUR PRIMARY CARE PHYSICIAN   If you need a refill on your cardiac medications before your next appointment, please call your pharmacy.     Signed, [name], MD  06/11/2017 11:03 AM    [facility] [address] [place], [place], [place]  [address] Phone: [phone]

## 2017-06-12 ENCOUNTER — Other Ambulatory Visit: Payer: Self-pay | Admitting: Pharmacist Clinician (PhC)/ Clinical Pharmacy Specialist

## 2017-06-12 MED ORDER — APIXABAN 5 MG PO TABS: 5.0000 mg | ORAL_TABLET | Freq: Two times a day (BID) | ORAL | 5 refills | Status: DC

## 2017-06-13 ENCOUNTER — Ambulatory Visit
Admission: RE | Admit: 2017-06-13 | Discharge: 2017-06-13 | Disposition: A | Discharge status: Home / Self Care | Payer: Medicare Other | Source: Ambulatory Visit | Attending: Internal Medicine | Admitting: Internal Medicine

## 2017-06-13 ENCOUNTER — Other Ambulatory Visit: Payer: Self-pay

## 2017-06-13 ENCOUNTER — Other Ambulatory Visit: Payer: Self-pay | Admitting: Internal Medicine

## 2017-06-13 ENCOUNTER — Telehealth: Payer: Self-pay | Admitting: *Deleted

## 2017-06-13 ENCOUNTER — Telehealth: Payer: Self-pay | Admitting: Cardiovascular Disease

## 2017-06-13 DIAGNOSIS — N6320 Unspecified lump in the left breast, unspecified quadrant: Secondary | ICD-10-CM

## 2017-06-13 DIAGNOSIS — N6310 Unspecified lump in the right breast, unspecified quadrant: Secondary | ICD-10-CM

## 2017-06-13 DIAGNOSIS — N6322 Unspecified lump in the left breast, upper inner quadrant: Secondary | ICD-10-CM | POA: Diagnosis not present

## 2017-06-13 DIAGNOSIS — R922 Inconclusive mammogram: Secondary | ICD-10-CM | POA: Diagnosis not present

## 2017-06-13 DIAGNOSIS — R599 Enlarged lymph nodes, unspecified: Secondary | ICD-10-CM

## 2017-06-13 DIAGNOSIS — N6489 Other specified disorders of breast: Secondary | ICD-10-CM | POA: Diagnosis not present

## 2017-06-13 MED ORDER — APIXABAN 5 MG PO TABS: 5.0000 mg | ORAL_TABLET | Freq: Two times a day (BID) | ORAL | 5 refills | Status: DC

## 2017-06-13 NOTE — Telephone Encounter (Signed)
[facility] called and stated that they found an area of concern on patient's Mammogram for Right Breast and wants to perform an additional test. They placed order and I gave verbal. Stated that you will need to sign off order they placed in EPIC.

## 2017-06-13 NOTE — Telephone Encounter (Signed)
New message   New message [facility]- [phone] ask for pharmacist.  Clarification needed on Eliquis frequency. Please call.  Pt c/o medication issue:  1. Name of Medication: Eliquis  2. How are you currently taking this medication (dosage and times per day)? ?  3. Are you having a reaction (difficulty breathing--STAT)? n/a 4. What is your medication issue?  Pharmacy calling for frequency clarification

## 2017-06-13 NOTE — Telephone Encounter (Signed)
Pharmacy states that they already got the directions 5mg  BID

## 2017-06-16 DIAGNOSIS — Z7901 Long term (current) use of anticoagulants: Secondary | ICD-10-CM | POA: Diagnosis not present

## 2017-06-16 DIAGNOSIS — I130 Hypertensive heart and chronic kidney disease with heart failure and stage 1 through stage 4 chronic kidney disease, or unspecified chronic kidney disease: Secondary | ICD-10-CM | POA: Diagnosis not present

## 2017-06-16 DIAGNOSIS — N63 Unspecified lump in unspecified breast: Secondary | ICD-10-CM | POA: Diagnosis not present

## 2017-06-16 DIAGNOSIS — Z7984 Long term (current) use of oral hypoglycemic drugs: Secondary | ICD-10-CM | POA: Diagnosis not present

## 2017-06-16 DIAGNOSIS — Z9181 History of falling: Secondary | ICD-10-CM | POA: Diagnosis not present

## 2017-06-16 DIAGNOSIS — I5033 Acute on chronic diastolic (congestive) heart failure: Secondary | ICD-10-CM | POA: Diagnosis not present

## 2017-06-16 DIAGNOSIS — Z9114 Patient's other noncompliance with medication regimen: Secondary | ICD-10-CM | POA: Diagnosis not present

## 2017-06-16 DIAGNOSIS — M25562 Pain in left knee: Secondary | ICD-10-CM | POA: Diagnosis not present

## 2017-06-16 DIAGNOSIS — M6281 Muscle weakness (generalized): Secondary | ICD-10-CM | POA: Diagnosis not present

## 2017-06-16 DIAGNOSIS — N182 Chronic kidney disease, stage 2 (mild): Secondary | ICD-10-CM | POA: Diagnosis not present

## 2017-06-16 DIAGNOSIS — I824Y2 Acute embolism and thrombosis of unspecified deep veins of left proximal lower extremity: Secondary | ICD-10-CM | POA: Diagnosis not present

## 2017-06-16 DIAGNOSIS — E1122 Type 2 diabetes mellitus with diabetic chronic kidney disease: Secondary | ICD-10-CM | POA: Diagnosis not present

## 2017-06-17 DIAGNOSIS — H524 Presbyopia: Secondary | ICD-10-CM | POA: Diagnosis not present

## 2017-06-17 DIAGNOSIS — H52222 Regular astigmatism, left eye: Secondary | ICD-10-CM | POA: Diagnosis not present

## 2017-06-17 DIAGNOSIS — H2513 Age-related nuclear cataract, bilateral: Secondary | ICD-10-CM | POA: Diagnosis not present

## 2017-06-17 DIAGNOSIS — H34831 Tributary (branch) retinal vein occlusion, right eye, with macular edema: Secondary | ICD-10-CM | POA: Diagnosis not present

## 2017-06-17 DIAGNOSIS — H40023 Open angle with borderline findings, high risk, bilateral: Secondary | ICD-10-CM | POA: Diagnosis not present

## 2017-06-18 ENCOUNTER — Encounter: Payer: Self-pay | Admitting: Internal Medicine

## 2017-06-18 ENCOUNTER — Ambulatory Visit (INDEPENDENT_AMBULATORY_CARE_PROVIDER_SITE_OTHER): Payer: Medicare Other | Admitting: Internal Medicine

## 2017-06-18 VITALS — BP 156/96 | HR 77 | Temp 98.1°F | Resp 16 | Ht 60.0 in | Wt 159.0 lb

## 2017-06-18 DIAGNOSIS — I10 Essential (primary) hypertension: Secondary | ICD-10-CM | POA: Diagnosis not present

## 2017-06-18 DIAGNOSIS — I825Y2 Chronic embolism and thrombosis of unspecified deep veins of left proximal lower extremity: Secondary | ICD-10-CM | POA: Diagnosis not present

## 2017-06-18 DIAGNOSIS — N63 Unspecified lump in unspecified breast: Secondary | ICD-10-CM | POA: Diagnosis not present

## 2017-06-18 DIAGNOSIS — E118 Type 2 diabetes mellitus with unspecified complications: Secondary | ICD-10-CM

## 2017-06-18 DIAGNOSIS — R413 Other amnesia: Secondary | ICD-10-CM | POA: Diagnosis not present

## 2017-06-18 DIAGNOSIS — H348312 Tributary (branch) retinal vein occlusion, right eye, stable: Secondary | ICD-10-CM | POA: Diagnosis not present

## 2017-06-18 MED ORDER — METFORMIN HCL ER 500 MG PO TB24: 500.0000 mg | ORAL_TABLET | Freq: Two times a day (BID) | ORAL | 1 refills | Status: DC

## 2017-06-18 NOTE — Patient Instructions (Signed)
Will call with vascular appt for carotid study to check blood vessels in neck  Continue current medications as ordered  Follow up with specialists as scheduled  NO DRIVING  Follow up in 1 month for memory, breast mass, DM, dHF

## 2017-06-18 NOTE — Progress Notes (Signed)
Patient ID: Charlene Rubio, female   DOB: [birth year], 71 y.o.   MRN: [id number]    Location:  [facility] Place of Service: OFFICE  Chief Complaint  Patient presents with  . Follow-up    1 month    HPI:  71 yo female seen today for f/u breast mass, memory impairment, LLE DVT, and HF. She went for mammogram that revealed 6.3 cm complex mass left breast, x2 abnormal left axillary LN, highly suspicious 1.7 cm mass right breast. She is scheduled for US guided bx of both breasts and left LN on Nov 5th. She is a poor historian due to memory loss. Hx obtained from chart.  Diastolic heart failure - stable on coreg, lasix, imdur and lisinopril. She also takes eliquis and ASA for anticoagulation. lexiscan Stress test  05/28/17 revealed reduced EF 41% but no ischemic changes. Followed by cardio Dr Georgina Peer. LDL 95  DM - uncontrolled. She takes Metformin. 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 Albumin 3.0.   Suspicious Breast mass of both breasts and x2 Left LN abnormality - scheduled for bx next week  Past Medical History:  Diagnosis Date  . Acute diastolic (congestive) heart failure ([place]) 04/25/2017  . Acute respiratory failure with hypoxia and hypercapnia ([place]) 04/25/2017  . ALLERGIC RHINITIS 07/01/2007   Qualifier: Diagnosis of  By: Sherren Mocha MD, Jory Ee   . Allergy   . Aortic insufficiency   . Aortic valve disorder 07/01/2007   Qualifier: Diagnosis of  By: Sherren Mocha MD, Dellis Filbert A   . Bilateral leg edema 04/25/2017  . Borderline diabetic   . CHF (congestive heart failure) ([place]) 04/25/2017  . Dementia 04/30/2017  . Diabetes mellitus ([place]) 04/25/2017  . DOE (dyspnea on exertion) 04/25/2017  . Essential hypertension 07/01/2007   Qualifier: Diagnosis of  By: Sherren Mocha MD, Jory Ee Glaucoma suspect   . Hypertension   . KNEE PAIN, LEFT 04/06/2010   Qualifier: Diagnosis of  By: Sherren Mocha MD, Jory Ee   . Obesity   . Obesity, unspecified 07/01/2007   Qualifier: Diagnosis of  By: Sherren Mocha MD,  Jory Ee   . Thyroid disease     History reviewed. No pertinent surgical history.  Patient Care Team: [name], DO as PCP - General (Internal Medicine)  Social History   Social History  . Marital status: Married    Spouse name: N/A  . Number of children: N/A  . Years of education: N/A   Occupational History  . retired Building control surveyor    Social History Main Topics  . Smoking status: Never Smoker  . Smokeless tobacco: Never Used  . Alcohol use No  . Drug use: No  . Sexual activity: Not Currently   Other Topics Concern  . Not on file   Social History Narrative   Social History   Diet?    Do you drink/eat things with caffeine? Yes (minimal) coke   Marital status?          married                          What year were you married? 1968   Do you live in a house, apartment, assisted living, condo, trailer, etc.? house   Is it one or more stories? one   How many persons live in your home? 2   Do you have any pets in your home? (please list) none   Highest level of  education completed?   Current or past profession: preschool teacher/nanny   Do you exercise?         0                             Type & how often?   Advanced Directives   Do you have a living will?   Do you have a DNR form?                                  If not, do you want to discuss one?   Do you have signed POA/HPOA for forms?    Functional Status   Do you have difficulty bathing or dressing yourself?   Do you have difficulty preparing food or eating?    Do you have difficulty managing your medications?   Do you have difficulty managing your finances?   Do you have difficulty affording your medications?    Admitted to [facility] 05/01/17   Never smoked   Alcohol none   DNR           reports that she has never smoked. She has never used smokeless tobacco. She reports that she does not drink alcohol or use drugs.  Family History  Problem Relation Age of Onset  . Alzheimer's disease  Mother 40  . Alzheimer's disease Father 24  . Congestive Heart Failure Father   . Alcoholism Brother   . Alzheimer's disease Brother   . Dementia Brother   . Alcoholism Brother   . Diabetes Brother    Family Status  Relation Status  . Mother Deceased  . Father Deceased  . Brother Alive  . Brother Alive  . Brother Deceased  . Son Alive  . Son Alive  . Son Alive     No Known Allergies  Medications: Patient's Medications  New Prescriptions   No medications on file  Previous Medications   APIXABAN (ELIQUIS) 5 MG TABS TABLET    Take 1 tablet (5 mg total) by mouth 2 (two) times daily.   ASPIRIN EC 81 MG EC TABLET    Take 1 tablet (81 mg total) by mouth daily.   CARVEDILOL (COREG) 6.25 MG TABLET    Take 1 tablet (6.25 mg total) by mouth 2 (two) times daily with a meal.   FUROSEMIDE (LASIX) 40 MG TABLET    Take 1.5 tablets (60 mg total) by mouth daily.   ISOSORBIDE MONONITRATE (IMDUR) 30 MG 24 HR TABLET    Take 0.5 tablets (15 mg total) by mouth daily.   LISINOPRIL (PRINIVIL,ZESTRIL) 10 MG TABLET    Take 1 tablet (10 mg total) by mouth daily.  Modified Medications   Modified Medication Previous Medication   METFORMIN (GLUCOPHAGE-XR) 500 MG 24 HR TABLET metFORMIN (GLUCOPHAGE-XR) 500 MG 24 hr tablet      Take 1 tablet (500 mg total) by mouth 2 (two) times daily with a meal.    Take 1 tablet (500 mg total) by mouth 2 (two) times daily with a meal. ADDITIONAL REFILLS FROM PRIMARY CARE DOCTOR  Discontinued Medications   No medications on file    Review of Systems  Unable to perform ROS: Other (memory loss)    Vitals:   06/18/17 0826  BP: (!) 156/96  Pulse: 77  Resp: 16  Temp: 98.1 F (36.7 C)  TempSrc: Oral  SpO2: 97%  Weight: 159 lb (72.1 kg)  Height: 5' (1.524 m)   Body mass index is 31.05 kg/m.  Physical Exam  Constitutional: She appears well-developed.  Frail appearing in NAD  HENT:  Mouth/Throat: Oropharynx is clear and moist. No oropharyngeal exudate.  MMM;  no oral thrush  Eyes: Pupils are equal, round, and reactive to light. No scleral icterus.  Neck: Neck supple. Carotid bruit is present (b/l systolic). No tracheal deviation present. No thyromegaly present.  Cardiovascular: Normal rate, regular rhythm and intact distal pulses.  Exam reveals no gallop and no friction rub.   Murmur heard.  Systolic murmur is present with a grade of 2/6  +2 pitting LLE with medial cord palpable and TTP; +1 pitting RLE edema. Left calf TTP but no right calf TTP  Pulmonary/Chest: Effort normal and breath sounds normal. No stridor. No respiratory distress. She has no wheezes. She has no rales.  Abdominal: Soft. Normal appearance and bowel sounds are normal. She exhibits no distension and no mass. There is no hepatomegaly. There is no tenderness. There is no rigidity, no rebound and no guarding. No hernia.  Musculoskeletal: She exhibits edema (left knee with reduced ROM).  Lymphadenopathy:    She has no cervical adenopathy.  Neurological: She is alert.  Skin: Skin is warm and dry. No rash noted.  Psychiatric: She has a normal mood and affect. Her behavior is normal. Thought content normal. Cognition and memory are impaired.     Labs reviewed: Hospital Outpatient Visit on 05/28/2017  Component Date Value Ref Range Status  . Rest HR 05/28/2017 72  bpm Final  . Rest BP 05/28/2017 124/76  mmHg Final  . Peak HR 05/28/2017 92  bpm Final  . Peak BP 05/28/2017 151/58  mmHg Final  . SSS 05/28/2017 4   Final  . SRS 05/28/2017 0   Final  . SDS 05/28/2017 4   Final  . TID 05/28/2017 1.04   Final  . LV sys vol 05/28/2017 126  mL Final  . LV dias vol 05/28/2017 213  46 - 106 mL Final  Nursing Home on 05/13/2017  Component Date Value Ref Range Status  . Hemoglobin 05/06/2017 14.7  12.0 - 16.0 Final  . HCT 05/06/2017 46  36 - 46 Final  . Platelets 05/06/2017 161  150 - 399 Final  . WBC 05/06/2017 4.7   Final  . Glucose 05/06/2017 135   Final  . BUN 05/06/2017 17  4 -  21 Final  . Creatinine 05/06/2017 0.6  0.5 - 1.1 Final  . Potassium 05/06/2017 4.5  3.4 - 5.3 Final  . Sodium 05/06/2017 142  137 - 147 Final  . Hemoglobin 05/09/2017 14.0  12.0 - 16.0 Final  . HCT 05/09/2017 44  36 - 46 Final  . Platelets 05/09/2017 139* 150 - 399 Final  . WBC 05/09/2017 5.0   Final  . Glucose 05/09/2017 121   Final  . BUN 05/09/2017 15  4 - 21 Final  . Creatinine 05/09/2017 0.4* 0.5 - 1.1 Final  . Potassium 05/09/2017 3.8  3.4 - 5.3 Final  . Sodium 05/09/2017 144  137 - 147 Final  . Triglycerides 05/09/2017 117  40 - 160 Final  . Cholesterol 05/09/2017 170  0 - 200 Final  . HDL 05/09/2017 52  35 - 70 Final  . LDL Cholesterol 05/09/2017 95   Final  . Alkaline Phosphatase 05/09/2017 72  25 - 125 Final  . ALT 05/09/2017 17  7 - 35 Final  .  AST 05/09/2017 16  13 - 35 Final  . Bilirubin, Total 05/09/2017 0.5   Final  . TSH 05/09/2017 4.03  0.41 - 5.90 Final  Admission on 04/25/2017, Discharged on 05/01/2017  No results displayed because visit has over 200 results.    Office Visit on 04/25/2017  Component Date Value Ref Range Status  . Color, UA 04/25/2017 Dark Yellow   Final  . Clarity, UA 04/25/2017 Cloudy   Final  . Glucose, UA 04/25/2017 250   Final   Could be related to dark color of urine   . Bilirubin, UA 04/25/2017 Neg   Final  . Ketones, UA 04/25/2017 Neg   Final  . Spec Grav, UA 04/25/2017 >=1.030* 1.010 - 1.025 Final  . Blood, UA 04/25/2017 Moderate   Final  . pH, UA 04/25/2017 6.0  5.0 - 8.0 Final  . Protein, UA 04/25/2017 300   Final  . Urobilinogen, UA 04/25/2017 2.0* 0.2 or 1.0 E.U./dL Final  . Nitrite, UA 04/25/2017 Neg   Final  . Leukocytes, UA 04/25/2017 Moderate (2+)* Negative Final   Unable to obtain a clean catch     US Breast Ltd Uni Left Inc Axilla  Result Date: 06/13/2017 CLINICAL DATA:  71 year old female presenting for evaluation of a palpable mass in the left breast. Of note, the patient suffers from suspected dementia and recently  had a long hospital stay for heart failure. EXAM: 2D DIGITAL DIAGNOSTIC BILATERAL MAMMOGRAM WITH CAD AND ADJUNCT TOMO BILATERAL BREAST ULTRASOUND COMPARISON:  None. ACR Breast Density Category c: The breast tissue is heterogeneously dense, which may obscure small masses. FINDINGS: On the left breast, a BB has been placed superior to the left nipple indicating the palpable site of concern. Deep to this marker is a 6.5 cm round dense mass. There is a possible mass inferior to this seen on the MLO view measuring 1.5 cm. In the upper-outer quadrant of the right breast, there is a palpable mass deep to a BB which has been placed on the skin measuring approximately 1.5 cm. This is irregular with indistinct margins. Mammographic images were processed with CAD. Ultrasound targeted to the left breast at 11 o'clock, 4 cm from the nipple demonstrates a large hypoechoic circumscribed mass with substantial internal mobile debris measuring 6.3 x 4.2 x 6.1 cm. There are multiple thick papillary projections into the cystic mass with marked blood flow on color Doppler imaging. Ultrasound of the left axilla demonstrates 2 adjacent morphologically abnormal second lymph nodes. Ultrasound of the right breast at 9:30, 6 cm from the nipple demonstrates an irregular hypoechoic mass with indistinct margins measuring 1.7 x 1.5 x 1.4 cm. Blood flow was identified within the mass on color Doppler imaging. Ultrasound of the right axilla demonstrates multiple normal-appearing lymph nodes. IMPRESSION: 1. There is a palpable 6.3 cm complex mass in the retroareolar left breast which is highly suspicious for malignancy. 2.  There are 2 abnormal left axillary lymph nodes. 3. There is a highly suspicious 1.7 cm mass in the right breast at 9:30. 4.  No evidence of right axillary lymphadenopathy. RECOMMENDATION: 1. Ultrasound-guided biopsy is recommended for the solid portion of the complex mass in the retroareolar left breast. The patient would like to  have the fluid component drained at the time of biopsy. 2. Ultrasound-guided biopsy of 1 of the abnormal left axillary lymph nodes is recommended. 3. Ultrasound-guided biopsy of the right breast mass at 9:30 is recommended. The above findings and recommendations were discussed with the patient, her  husband and her daughter-in-law. These procedures have been scheduled for 06/23/2017 at 12:45 p.m. I have discussed the findings and recommendations with the patient. Results were also provided in writing at the conclusion of the visit. If applicable, a reminder letter will be sent to the patient regarding the next appointment. BI-RADS CATEGORY  5: Highly suggestive of malignancy. Electronically Signed   By: Ammie Ferrier M.D.   On: 06/13/2017 12:18   US Breast Ltd Uni Right Inc Axilla  Result Date: 06/13/2017 CLINICAL DATA:  71 year old female presenting for evaluation of a palpable mass in the left breast. Of note, the patient suffers from suspected dementia and recently had a long hospital stay for heart failure. EXAM: 2D DIGITAL DIAGNOSTIC BILATERAL MAMMOGRAM WITH CAD AND ADJUNCT TOMO BILATERAL BREAST ULTRASOUND COMPARISON:  None. ACR Breast Density Category c: The breast tissue is heterogeneously dense, which may obscure small masses. FINDINGS: On the left breast, a BB has been placed superior to the left nipple indicating the palpable site of concern. Deep to this marker is a 6.5 cm round dense mass. There is a possible mass inferior to this seen on the MLO view measuring 1.5 cm. In the upper-outer quadrant of the right breast, there is a palpable mass deep to a BB which has been placed on the skin measuring approximately 1.5 cm. This is irregular with indistinct margins. Mammographic images were processed with CAD. Ultrasound targeted to the left breast at 11 o'clock, 4 cm from the nipple demonstrates a large hypoechoic circumscribed mass with substantial internal mobile debris measuring 6.3 x 4.2 x 6.1  cm. There are multiple thick papillary projections into the cystic mass with marked blood flow on color Doppler imaging. Ultrasound of the left axilla demonstrates 2 adjacent morphologically abnormal second lymph nodes. Ultrasound of the right breast at 9:30, 6 cm from the nipple demonstrates an irregular hypoechoic mass with indistinct margins measuring 1.7 x 1.5 x 1.4 cm. Blood flow was identified within the mass on color Doppler imaging. Ultrasound of the right axilla demonstrates multiple normal-appearing lymph nodes. IMPRESSION: 1. There is a palpable 6.3 cm complex mass in the retroareolar left breast which is highly suspicious for malignancy. 2.  There are 2 abnormal left axillary lymph nodes. 3. There is a highly suspicious 1.7 cm mass in the right breast at 9:30. 4.  No evidence of right axillary lymphadenopathy. RECOMMENDATION: 1. Ultrasound-guided biopsy is recommended for the solid portion of the complex mass in the retroareolar left breast. The patient would like to have the fluid component drained at the time of biopsy. 2. Ultrasound-guided biopsy of 1 of the abnormal left axillary lymph nodes is recommended. 3. Ultrasound-guided biopsy of the right breast mass at 9:30 is recommended. The above findings and recommendations were discussed with the patient, her husband and her daughter-in-law. These procedures have been scheduled for 06/23/2017 at 12:45 p.m. I have discussed the findings and recommendations with the patient. Results were also provided in writing at the conclusion of the visit. If applicable, a reminder letter will be sent to the patient regarding the next appointment. BI-RADS CATEGORY  5: Highly suggestive of malignancy. Electronically Signed   By: Ammie Ferrier M.D.   On: 06/13/2017 12:18   Mm Diag Breast Tomo Bilateral  Result Date: 06/13/2017 CLINICAL DATA:  71 year old female presenting for evaluation of a palpable mass in the left breast. Of note, the patient suffers from  suspected dementia and recently had a long hospital stay for heart failure. EXAM: 2D DIGITAL  DIAGNOSTIC BILATERAL MAMMOGRAM WITH CAD AND ADJUNCT TOMO BILATERAL BREAST ULTRASOUND COMPARISON:  None. ACR Breast Density Category c: The breast tissue is heterogeneously dense, which may obscure small masses. FINDINGS: On the left breast, a BB has been placed superior to the left nipple indicating the palpable site of concern. Deep to this marker is a 6.5 cm round dense mass. There is a possible mass inferior to this seen on the MLO view measuring 1.5 cm. In the upper-outer quadrant of the right breast, there is a palpable mass deep to a BB which has been placed on the skin measuring approximately 1.5 cm. This is irregular with indistinct margins. Mammographic images were processed with CAD. Ultrasound targeted to the left breast at 11 o'clock, 4 cm from the nipple demonstrates a large hypoechoic circumscribed mass with substantial internal mobile debris measuring 6.3 x 4.2 x 6.1 cm. There are multiple thick papillary projections into the cystic mass with marked blood flow on color Doppler imaging. Ultrasound of the left axilla demonstrates 2 adjacent morphologically abnormal second lymph nodes. Ultrasound of the right breast at 9:30, 6 cm from the nipple demonstrates an irregular hypoechoic mass with indistinct margins measuring 1.7 x 1.5 x 1.4 cm. Blood flow was identified within the mass on color Doppler imaging. Ultrasound of the right axilla demonstrates multiple normal-appearing lymph nodes. IMPRESSION: 1. There is a palpable 6.3 cm complex mass in the retroareolar left breast which is highly suspicious for malignancy. 2.  There are 2 abnormal left axillary lymph nodes. 3. There is a highly suspicious 1.7 cm mass in the right breast at 9:30. 4.  No evidence of right axillary lymphadenopathy. RECOMMENDATION: 1. Ultrasound-guided biopsy is recommended for the solid portion of the complex mass in the retroareolar left  breast. The patient would like to have the fluid component drained at the time of biopsy. 2. Ultrasound-guided biopsy of 1 of the abnormal left axillary lymph nodes is recommended. 3. Ultrasound-guided biopsy of the right breast mass at 9:30 is recommended. The above findings and recommendations were discussed with the patient, her husband and her daughter-in-law. These procedures have been scheduled for 06/23/2017 at 12:45 p.m. I have discussed the findings and recommendations with the patient. Results were also provided in writing at the conclusion of the visit. If applicable, a reminder letter will be sent to the patient regarding the next appointment. BI-RADS CATEGORY  5: Highly suggestive of malignancy. Electronically Signed   By: Ammie Ferrier M.D.   On: 06/13/2017 12:18     Assessment/Plan   ICD-10-CM   1. Type 2 diabetes mellitus with complication, without long-term current use of insulin (HCC) E11.8   2. Chronic deep vein thrombosis (DVT) of proximal vein of left lower extremity (HCC) I82.5Y2   3. Memory loss or impairment R41.3   4. Breast mass N63.0    L>R  5. Essential hypertension I10   6. 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 Perlie Gold  [facility] [address] [place], [place] [address] [phone] Cell (Monday-Friday 8 AM - 5 PM) [phone] After 5 PM and follow prompts

## 2017-06-18 NOTE — Progress Notes (Signed)
Patient ID: Charlene Rubio, female   DOB: 01-22-1946, 71 y.o.   MRN: [id number]   Location:  [facility] clinic  Provider:   Code Status:  Goals of Care:  Advanced Directives 05/20/2017  Does Patient Have a Medical Advance Directive? Yes  Type of Advance Directive Out of facility DNR (pink MOST or yellow form)  Would patient like information on creating a medical advance directive? -  Pre-existing out of facility DNR order (yellow form or pink MOST form) Yellow form placed in chart (order not valid for inpatient use)     Chief Complaint  Patient presents with  . Follow-up    1 month    HPI: Patient is a 71 y.o. female seen today for medical management of chronic diseases. 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 She was seen [facility]. Dr. Posey Pronto at [facility] wants to make sure her carotids were checked.   Patient states she wants to use a knee brace for her left knee do to knee pain.  DM - A1c was 7.8 on 04/26/17. Takes metformin to manage.  Patient states some balance issues, but refuses to use walker or cane for ambulation.  Home health PT finished this week.   DVT LLE - Taking Eliquis twice a day. Pt will need LLE venous doppler in 6 months.  Bilateral Breast mass - Patient will have biopsy November 5th.   Memory Loss - Patient has some memory issues with medication management.  CHF - On lasix 60 mg per day.  HTN - Managed with Lisinopril, Carvedilol.     Past Medical History:  Diagnosis Date  . Acute diastolic (congestive) heart failure ([place]) 04/25/2017  . Acute respiratory failure with hypoxia and hypercapnia ([place]) 04/25/2017  . ALLERGIC RHINITIS 07/01/2007   Qualifier: Diagnosis of  By: Sherren Mocha MD, Jory Ee   . Allergy   . Aortic insufficiency   . Aortic valve disorder 07/01/2007   Qualifier: Diagnosis of  By: Sherren Mocha MD, Dellis Filbert A   . Bilateral leg edema 04/25/2017  . Borderline diabetic   . CHF (congestive heart failure) ([place])  04/25/2017  . Dementia 04/30/2017  . Diabetes mellitus ([place]) 04/25/2017  . DOE (dyspnea on exertion) 04/25/2017  . Essential hypertension 07/01/2007   Qualifier: Diagnosis of  By: Sherren Mocha MD, Jory Ee Glaucoma suspect   . Hypertension   . KNEE PAIN, LEFT 04/06/2010   Qualifier: Diagnosis of  By: Sherren Mocha MD, Jory Ee   . Obesity   . Obesity, unspecified 07/01/2007   Qualifier: Diagnosis of  By: Sherren Mocha MD, Jory Ee   . Thyroid disease     History reviewed. No pertinent surgical history.  No Known Allergies  Outpatient Encounter Prescriptions as of 06/18/2017  Medication Sig  . apixaban (ELIQUIS) 5 MG TABS tablet Take 1 tablet (5 mg total) by mouth 2 (two) times daily.  Marland Kitchen aspirin EC 81 MG EC tablet Take 1 tablet (81 mg total) by mouth daily.  . carvedilol (COREG) 6.25 MG tablet Take 1 tablet (6.25 mg total) by mouth 2 (two) times daily with a meal.  . furosemide (LASIX) 40 MG tablet Take 1.5 tablets (60 mg total) by mouth daily.  . isosorbide mononitrate (IMDUR) 30 MG 24 hr tablet Take 0.5 tablets (15 mg total) by mouth daily.  Marland Kitchen lisinopril (PRINIVIL,ZESTRIL) 10 MG tablet Take 1 tablet (10 mg total) by mouth daily.  . metFORMIN (GLUCOPHAGE-XR) 500 MG 24 hr tablet  Take 1 tablet (500 mg total) by mouth 2 (two) times daily with a meal.  . [DISCONTINUED] metFORMIN (GLUCOPHAGE-XR) 500 MG 24 hr tablet Take 1 tablet (500 mg total) by mouth 2 (two) times daily with a meal. ADDITIONAL REFILLS FROM PRIMARY CARE DOCTOR   No facility-administered encounter medications on file as of 06/18/2017.     Review of Systems:  Review of Systems  Constitutional: Negative for activity change, appetite change, chills, fatigue and fever.  HENT: Negative for congestion, hearing loss, mouth sores, sinus pain, sinus pressure, sore throat and tinnitus.   Eyes: Negative for pain, redness and itching.  Respiratory: Negative for apnea, cough, choking, chest tightness and shortness of breath.   Cardiovascular: Negative  for chest pain, palpitations and leg swelling.  Gastrointestinal: Negative for abdominal distention, abdominal pain, blood in stool, constipation, diarrhea, nausea and vomiting.  Genitourinary: Negative for difficulty urinating and frequency.  Musculoskeletal: Positive for arthralgias (left knee) and joint swelling (left knee).  Skin: Negative for color change and pallor.  Allergic/Immunologic: Negative for environmental allergies.  Neurological: Negative for dizziness, seizures, syncope, facial asymmetry and headaches.  Hematological: Negative for adenopathy.  Psychiatric/Behavioral: Negative for agitation, behavioral problems and confusion.    Health Maintenance  Topic Date Due  . FOOT EXAM  12/22/1955  . OPHTHALMOLOGY EXAM  12/22/1955  . DEXA SCAN  08/19/2017 (Originally 12/21/2009)  . COLONOSCOPY  08/19/2017 (Originally 12/22/1994)  . Hepatitis C Screening  08/19/2017 (Originally 1946/03/15)  . HEMOGLOBIN A1C  10/24/2017  . PNA vac Low Risk Adult (2 of 2 - PCV13) 04/29/2018  . MAMMOGRAM  06/14/2019  . TETANUS/TDAP  04/06/2020  . INFLUENZA VACCINE  Completed    Physical Exam: Vitals:   06/18/17 0826  BP: (!) 156/96  Pulse: 77  Resp: 16  Temp: 98.1 F (36.7 C)  TempSrc: Oral  SpO2: 97%  Weight: 159 lb (72.1 kg)  Height: 5' (1.524 m)   Body mass index is 31.05 kg/m. Physical Exam  Constitutional: She appears well-developed and well-nourished. No distress.  HENT:  Head: Normocephalic and atraumatic.  Nose: Nose normal.  Mouth/Throat: Oropharynx is clear and moist. No oropharyngeal exudate.  Eyes: Pupils are equal, round, and reactive to light. Conjunctivae and EOM are normal. Right eye exhibits no discharge. Left eye exhibits no discharge. No scleral icterus.    Small cyst lateral corner of right eye.   Neck: Normal range of motion. Neck supple. No JVD present.  Cardiovascular: Normal rate, regular rhythm, normal heart sounds and intact distal pulses.   Pulmonary/Chest:  Effort normal and breath sounds normal. No respiratory distress. She has no wheezes.  Abdominal: Soft. Bowel sounds are normal. She exhibits no mass. There is no tenderness.  Musculoskeletal: She exhibits edema (left lower extremity +1).  Lymphadenopathy:    She has no cervical adenopathy.  Neurological: She is alert.  Skin: Skin is warm and dry. She is not diaphoretic.    Labs reviewed: Basic Metabolic Panel:  Recent Labs  04/26/17 0455  04/28/17 0218 04/29/17 0533 04/30/17 0525 05/06/17 05/09/17  NA 144  < > 140 138 138 142 144  K 3.2*  < > 3.6 3.4* 3.9 4.5 3.8  CL 105  < > 98* 96* 96*  --   --   CO2 28  < > 32 35* 33*  --   --   GLUCOSE 145*  < > 68 149* 133*  --   --   BUN 25*  < > 22* 22* 21* 17 15  CREATININE 1.11*  < > 0.88 0.91 0.89 0.6 0.4*  CALCIUM 8.4*  < > 8.0* 7.8* 8.2*  --   --   MG  --   --  1.8  --  2.0  --   --   PHOS  --   --  3.5  --   --   --   --   TSH 2.447  --   --   --   --   --  4.03  < > = values in this interval not displayed. Liver Function Tests:  Recent Labs  04/25/17 1310 04/27/17 0337 04/30/17 0525 05/09/17  AST 33 30 29 16   ALT 42 34 28 17  ALKPHOS 63 46 45 72  BILITOT 1.8* 1.3* 1.3*  --   PROT 6.1* 4.7* 5.1*  --   ALBUMIN 3.9 2.8* 3.0*  --    No results for input(s): LIPASE, AMYLASE in the last 8760 hours.  Recent Labs  04/30/17 0525  AMMONIA 36*   CBC:  Recent Labs  04/28/17 0218 04/29/17 0533 04/30/17 0525 05/06/17 05/09/17  WBC 9.3 6.3 9.1 4.7 5.0  HGB 15.5* 14.3 16.7* 14.7 14.0  HCT 47.2* 44.1 49.8* 46 44  MCV 95.4 96.5 96.1  --   --   PLT 114* 102* 98* 161 139*   Lipid Panel:  Recent Labs  05/09/17  CHOL 170  HDL 52  LDLCALC 95  TRIG 117   Lab Results  Component Value Date   HGBA1C 7.8 (H) 04/26/2017    Procedures since last visit: US Breast Ltd Uni Left Inc Axilla  Result Date: 06/13/2017 CLINICAL DATA:  71 year old female presenting for evaluation of a palpable mass in the left breast. Of note,  the patient suffers from suspected dementia and recently had a long hospital stay for heart failure. EXAM: 2D DIGITAL DIAGNOSTIC BILATERAL MAMMOGRAM WITH CAD AND ADJUNCT TOMO BILATERAL BREAST ULTRASOUND COMPARISON:  None. ACR Breast Density Category c: The breast tissue is heterogeneously dense, which may obscure small masses. FINDINGS: On the left breast, a BB has been placed superior to the left nipple indicating the palpable site of concern. Deep to this marker is a 6.5 cm round dense mass. There is a possible mass inferior to this seen on the MLO view measuring 1.5 cm. In the upper-outer quadrant of the right breast, there is a palpable mass deep to a BB which has been placed on the skin measuring approximately 1.5 cm. This is irregular with indistinct margins. Mammographic images were processed with CAD. Ultrasound targeted to the left breast at 11 o'clock, 4 cm from the nipple demonstrates a large hypoechoic circumscribed mass with substantial internal mobile debris measuring 6.3 x 4.2 x 6.1 cm. There are multiple thick papillary projections into the cystic mass with marked blood flow on color Doppler imaging. Ultrasound of the left axilla demonstrates 2 adjacent morphologically abnormal second lymph nodes. Ultrasound of the right breast at 9:30, 6 cm from the nipple demonstrates an irregular hypoechoic mass with indistinct margins measuring 1.7 x 1.5 x 1.4 cm. Blood flow was identified within the mass on color Doppler imaging. Ultrasound of the right axilla demonstrates multiple normal-appearing lymph nodes. IMPRESSION: 1. There is a palpable 6.3 cm complex mass in the retroareolar left breast which is highly suspicious for malignancy. 2.  There are 2 abnormal left axillary lymph nodes. 3. There is a highly suspicious 1.7 cm mass in the right breast at 9:30. 4.  No evidence of right axillary lymphadenopathy.  RECOMMENDATION: 1. Ultrasound-guided biopsy is recommended for the solid portion of the complex mass  in the retroareolar left breast. The patient would like to have the fluid component drained at the time of biopsy. 2. Ultrasound-guided biopsy of 1 of the abnormal left axillary lymph nodes is recommended. 3. Ultrasound-guided biopsy of the right breast mass at 9:30 is recommended. The above findings and recommendations were discussed with the patient, her husband and her daughter-in-law. These procedures have been scheduled for 06/23/2017 at 12:45 p.m. I have discussed the findings and recommendations with the patient. Results were also provided in writing at the conclusion of the visit. If applicable, a reminder letter will be sent to the patient regarding the next appointment. BI-RADS CATEGORY  5: Highly suggestive of malignancy. Electronically Signed   By: Ammie Ferrier M.D.   On: 06/13/2017 12:18   US Breast Ltd Uni Right Inc Axilla  Result Date: 06/13/2017 CLINICAL DATA:  71 year old female presenting for evaluation of a palpable mass in the left breast. Of note, the patient suffers from suspected dementia and recently had a long hospital stay for heart failure. EXAM: 2D DIGITAL DIAGNOSTIC BILATERAL MAMMOGRAM WITH CAD AND ADJUNCT TOMO BILATERAL BREAST ULTRASOUND COMPARISON:  None. ACR Breast Density Category c: The breast tissue is heterogeneously dense, which may obscure small masses. FINDINGS: On the left breast, a BB has been placed superior to the left nipple indicating the palpable site of concern. Deep to this marker is a 6.5 cm round dense mass. There is a possible mass inferior to this seen on the MLO view measuring 1.5 cm. In the upper-outer quadrant of the right breast, there is a palpable mass deep to a BB which has been placed on the skin measuring approximately 1.5 cm. This is irregular with indistinct margins. Mammographic images were processed with CAD. Ultrasound targeted to the left breast at 11 o'clock, 4 cm from the nipple demonstrates a large hypoechoic circumscribed mass with  substantial internal mobile debris measuring 6.3 x 4.2 x 6.1 cm. There are multiple thick papillary projections into the cystic mass with marked blood flow on color Doppler imaging. Ultrasound of the left axilla demonstrates 2 adjacent morphologically abnormal second lymph nodes. Ultrasound of the right breast at 9:30, 6 cm from the nipple demonstrates an irregular hypoechoic mass with indistinct margins measuring 1.7 x 1.5 x 1.4 cm. Blood flow was identified within the mass on color Doppler imaging. Ultrasound of the right axilla demonstrates multiple normal-appearing lymph nodes. IMPRESSION: 1. There is a palpable 6.3 cm complex mass in the retroareolar left breast which is highly suspicious for malignancy. 2.  There are 2 abnormal left axillary lymph nodes. 3. There is a highly suspicious 1.7 cm mass in the right breast at 9:30. 4.  No evidence of right axillary lymphadenopathy. RECOMMENDATION: 1. Ultrasound-guided biopsy is recommended for the solid portion of the complex mass in the retroareolar left breast. The patient would like to have the fluid component drained at the time of biopsy. 2. Ultrasound-guided biopsy of 1 of the abnormal left axillary lymph nodes is recommended. 3. Ultrasound-guided biopsy of the right breast mass at 9:30 is recommended. The above findings and recommendations were discussed with the patient, her husband and her daughter-in-law. These procedures have been scheduled for 06/23/2017 at 12:45 p.m. I have discussed the findings and recommendations with the patient. Results were also provided in writing at the conclusion of the visit. If applicable, a reminder letter will be sent to the patient regarding the next  appointment. BI-RADS CATEGORY  5: Highly suggestive of malignancy. Electronically Signed   By: Ammie Ferrier M.D.   On: 06/13/2017 12:18   Mm Diag Breast Tomo Bilateral  Result Date: 06/13/2017 CLINICAL DATA:  71 year old female presenting for evaluation of a palpable  mass in the left breast. Of note, the patient suffers from suspected dementia and recently had a long hospital stay for heart failure. EXAM: 2D DIGITAL DIAGNOSTIC BILATERAL MAMMOGRAM WITH CAD AND ADJUNCT TOMO BILATERAL BREAST ULTRASOUND COMPARISON:  None. ACR Breast Density Category c: The breast tissue is heterogeneously dense, which may obscure small masses. FINDINGS: On the left breast, a BB has been placed superior to the left nipple indicating the palpable site of concern. Deep to this marker is a 6.5 cm round dense mass. There is a possible mass inferior to this seen on the MLO view measuring 1.5 cm. In the upper-outer quadrant of the right breast, there is a palpable mass deep to a BB which has been placed on the skin measuring approximately 1.5 cm. This is irregular with indistinct margins. Mammographic images were processed with CAD. Ultrasound targeted to the left breast at 11 o'clock, 4 cm from the nipple demonstrates a large hypoechoic circumscribed mass with substantial internal mobile debris measuring 6.3 x 4.2 x 6.1 cm. There are multiple thick papillary projections into the cystic mass with marked blood flow on color Doppler imaging. Ultrasound of the left axilla demonstrates 2 adjacent morphologically abnormal second lymph nodes. Ultrasound of the right breast at 9:30, 6 cm from the nipple demonstrates an irregular hypoechoic mass with indistinct margins measuring 1.7 x 1.5 x 1.4 cm. Blood flow was identified within the mass on color Doppler imaging. Ultrasound of the right axilla demonstrates multiple normal-appearing lymph nodes. IMPRESSION: 1. There is a palpable 6.3 cm complex mass in the retroareolar left breast which is highly suspicious for malignancy. 2.  There are 2 abnormal left axillary lymph nodes. 3. There is a highly suspicious 1.7 cm mass in the right breast at 9:30. 4.  No evidence of right axillary lymphadenopathy. RECOMMENDATION: 1. Ultrasound-guided biopsy is recommended for the  solid portion of the complex mass in the retroareolar left breast. The patient would like to have the fluid component drained at the time of biopsy. 2. Ultrasound-guided biopsy of 1 of the abnormal left axillary lymph nodes is recommended. 3. Ultrasound-guided biopsy of the right breast mass at 9:30 is recommended. The above findings and recommendations were discussed with the patient, her husband and her daughter-in-law. These procedures have been scheduled for 06/23/2017 at 12:45 p.m. I have discussed the findings and recommendations with the patient. Results were also provided in writing at the conclusion of the visit. If applicable, a reminder letter will be sent to the patient regarding the next appointment. BI-RADS CATEGORY  5: Highly suggestive of malignancy. Electronically Signed   By: Ammie Ferrier M.D.   On: 06/13/2017 12:18    Assessment/Plan There are no diagnoses linked to this encounter.   Labs/tests ordered:  @ORDERS @ Next appt:  Visit date not found

## 2017-06-19 DIAGNOSIS — H348312 Tributary (branch) retinal vein occlusion, right eye, stable: Secondary | ICD-10-CM | POA: Insufficient documentation

## 2017-06-23 ENCOUNTER — Other Ambulatory Visit (HOSPITAL_COMMUNITY)
Admission: RE | Admit: 2017-06-23 | Discharge: 2017-06-23 | Disposition: A | Discharge status: Home / Self Care | Payer: Medicare Other | Source: Ambulatory Visit | Attending: Diagnostic Radiology | Admitting: Diagnostic Radiology

## 2017-06-23 ENCOUNTER — Ambulatory Visit
Admission: RE | Admit: 2017-06-23 | Discharge: 2017-06-23 | Disposition: A | Discharge status: Home / Self Care | Payer: Medicare Other | Source: Ambulatory Visit | Attending: Internal Medicine | Admitting: Internal Medicine

## 2017-06-23 ENCOUNTER — Other Ambulatory Visit: Payer: Self-pay | Admitting: Internal Medicine

## 2017-06-23 DIAGNOSIS — C50212 Malignant neoplasm of upper-inner quadrant of left female breast: Secondary | ICD-10-CM | POA: Diagnosis not present

## 2017-06-23 DIAGNOSIS — N6311 Unspecified lump in the right breast, upper outer quadrant: Secondary | ICD-10-CM | POA: Diagnosis not present

## 2017-06-23 DIAGNOSIS — N6310 Unspecified lump in the right breast, unspecified quadrant: Secondary | ICD-10-CM

## 2017-06-23 DIAGNOSIS — C773 Secondary and unspecified malignant neoplasm of axilla and upper limb lymph nodes: Secondary | ICD-10-CM | POA: Diagnosis not present

## 2017-06-23 DIAGNOSIS — R599 Enlarged lymph nodes, unspecified: Secondary | ICD-10-CM

## 2017-06-23 DIAGNOSIS — R590 Localized enlarged lymph nodes: Secondary | ICD-10-CM | POA: Diagnosis not present

## 2017-06-23 DIAGNOSIS — N6320 Unspecified lump in the left breast, unspecified quadrant: Secondary | ICD-10-CM

## 2017-06-23 DIAGNOSIS — N6322 Unspecified lump in the left breast, upper inner quadrant: Secondary | ICD-10-CM | POA: Diagnosis not present

## 2017-06-23 DIAGNOSIS — C50411 Malignant neoplasm of upper-outer quadrant of right female breast: Secondary | ICD-10-CM | POA: Diagnosis not present

## 2017-06-27 ENCOUNTER — Telehealth: Payer: Self-pay | Admitting: *Deleted

## 2017-06-27 NOTE — Telephone Encounter (Signed)
Atrial Valve operating at 20 %, patient with dementia. Patient is stating "I do not want surgery" yet is still interested in going to the appointment next Wednesday with [facility] . Patient's daughter would like to know patient's diagnosis.  Per Dr.Carter patient/family to review last AVS that has all major diagnosis listed, patient also with Heart Failure (not listed on AVS)  Amiee requested that Dr.Carter's last note be sent to [facility], faxed.

## 2017-06-27 NOTE — Telephone Encounter (Signed)
Aimee, daughter in Law called and left message on Clinical Intake stating that she would like to have someone review patient's chart with her due to new diagnosis of breast cancer. Has appointment with [facility] next week and family needing guidance because patient is not wanting breast surgery.   Tried calling daughter in law back but had to [facility] to return call. (Patient signed for daughter in law to receive information yesterday with Roland Earl).

## 2017-06-29 ENCOUNTER — Encounter: Payer: Self-pay | Admitting: General Surgery

## 2017-07-01 DIAGNOSIS — I38 Endocarditis, valve unspecified: Secondary | ICD-10-CM | POA: Diagnosis not present

## 2017-07-01 DIAGNOSIS — C50911 Malignant neoplasm of unspecified site of right female breast: Secondary | ICD-10-CM | POA: Diagnosis not present

## 2017-07-01 DIAGNOSIS — C50912 Malignant neoplasm of unspecified site of left female breast: Secondary | ICD-10-CM | POA: Diagnosis not present

## 2017-07-01 DIAGNOSIS — R413 Other amnesia: Secondary | ICD-10-CM | POA: Diagnosis not present

## 2017-07-01 DIAGNOSIS — C773 Secondary and unspecified malignant neoplasm of axilla and upper limb lymph nodes: Secondary | ICD-10-CM | POA: Diagnosis not present

## 2017-07-02 ENCOUNTER — Telehealth: Payer: Self-pay

## 2017-07-02 ENCOUNTER — Telehealth: Payer: Self-pay | Admitting: *Deleted

## 2017-07-02 ENCOUNTER — Encounter: Payer: Self-pay | Admitting: Cardiovascular Disease

## 2017-07-02 NOTE — Telephone Encounter (Signed)
   [facility] Pre-operative Risk Assessment    Request for surgical clearance:  1. What type of surgery is being performed? Bilateral mastectomies   2. When is this surgery scheduled? TBD   3. Are there any medications that need to be held prior to surgery and how long? Eliquis   4. Practice name and name of physician performing surgery? [facility], Dr. Dalbert Batman  5. What is your office phone and fax number? [phone]   6. Anesthesia type (None, local, MAC, general) ?    Trejan Buda A Sallee Hogrefe 07/02/2017, 12:50 PM  _________________________________________________________________   (provider comments below)

## 2017-07-02 NOTE — Telephone Encounter (Signed)
   Chart reviewed as part of pre-operative protocol coverage. Last seen by Dr. Claiborne Billings 06/11/17.   I will route to Dr. Claiborne Billings for clearance and ASA review. Route to pharmacy for Anticoagulation review.   [place], PA 07/02/2017, 4:12 PM

## 2017-07-02 NOTE — Telephone Encounter (Signed)
   [facility] Pre-operative Risk Assessment    Request for surgical clearance:  1. What type of surgery is being performed? Breast Cancer  2. When is this surgery scheduled? Not scheduled yet  3. Are there any medications that need to be held prior to surgery and how long?Eliquis and Aspirin   4. Practice name and name of physician performing surgery? [facility]   Dr.Ingram  5. What is your office phone and fax number? Phone # [phone]    Fax # [phone]  6. Anesthesia type (None, local, MAC, general) ? General    Charlene Rubio 07/02/2017, 2:35 PM  _________________________________________________________________   (provider comments below)

## 2017-07-02 NOTE — Telephone Encounter (Signed)
Opened in error

## 2017-07-02 NOTE — Telephone Encounter (Signed)
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 Renal function is normal. Typically would recommend holding Eliquis for 2 days prior to procedure, however will defer to Dr Claiborne Billings for recommendation regarding holding Eliquis since DVT was noted only 2 months ago.

## 2017-07-03 NOTE — Telephone Encounter (Signed)
Agree to hold eliquis for 2 days prior to surgery.  On her Doppler studies in September, she was found to have a chronic rather than acute DVT involving the left lower extremity.

## 2017-07-04 DIAGNOSIS — H34831 Tributary (branch) retinal vein occlusion, right eye, with macular edema: Secondary | ICD-10-CM | POA: Diagnosis not present

## 2017-07-04 DIAGNOSIS — H524 Presbyopia: Secondary | ICD-10-CM | POA: Diagnosis not present

## 2017-07-04 DIAGNOSIS — H2513 Age-related nuclear cataract, bilateral: Secondary | ICD-10-CM | POA: Diagnosis not present

## 2017-07-04 DIAGNOSIS — H40023 Open angle with borderline findings, high risk, bilateral: Secondary | ICD-10-CM | POA: Diagnosis not present

## 2017-07-04 LAB — HM DIABETES EYE EXAM

## 2017-07-04 NOTE — Telephone Encounter (Signed)
Clearance routed via Epic fax to [place]

## 2017-07-04 NOTE — Telephone Encounter (Signed)
   Chart reviewed as part of pre-operative protocol coverage.  She was seen by Dr. Claiborne Billings 06/11/2017.  At that visit, she was stable from a volume standpoint.  He adjusted blood pressure medications and renewed her metformin until she could get a PCP.  At that point, she was stable enough to be followed in 3 months.  I spoke with Dr. Claiborne Billings in the office.  I reviewed her history with him.  He feels that given past medical history and time since last visit, based on ACC/AHA guidelines, Charlene Rubio would be at acceptable risk for the planned procedure without further cardiovascular testing.   I will route this recommendation to the requesting party via Epic fax function and remove from pre-op pool.  Please call with questions.  Rosaria Ferries, PA-C 07/04/2017, 3:54 PM

## 2017-07-07 ENCOUNTER — Encounter: Payer: Self-pay | Admitting: Hematology and Oncology

## 2017-07-07 ENCOUNTER — Telehealth: Payer: Self-pay | Admitting: Hematology and Oncology

## 2017-07-07 NOTE — Telephone Encounter (Signed)
Appt has been scheduled for the pt to see [name] on 11/26 at 10am. Appt was given to the pt's husband. I offered an appt on 11/21, but the pt's husband said they were unable to come to the appt. Pt's address verified. Letter mailed.

## 2017-07-14 ENCOUNTER — Encounter: Payer: Self-pay | Admitting: *Deleted

## 2017-07-14 ENCOUNTER — Ambulatory Visit: Payer: Medicare Other | Admitting: Hematology and Oncology

## 2017-07-14 VITALS — BP 207/77 | HR 91 | Temp 98.3°F | Resp 18 | Ht 60.0 in | Wt 162.2 lb

## 2017-07-14 DIAGNOSIS — C50212 Malignant neoplasm of upper-inner quadrant of left female breast: Secondary | ICD-10-CM | POA: Insufficient documentation

## 2017-07-14 DIAGNOSIS — C773 Secondary and unspecified malignant neoplasm of axilla and upper limb lymph nodes: Secondary | ICD-10-CM

## 2017-07-14 DIAGNOSIS — C50811 Malignant neoplasm of overlapping sites of right female breast: Secondary | ICD-10-CM | POA: Insufficient documentation

## 2017-07-14 DIAGNOSIS — C50812 Malignant neoplasm of overlapping sites of left female breast: Principal | ICD-10-CM

## 2017-07-14 DIAGNOSIS — I509 Heart failure, unspecified: Secondary | ICD-10-CM

## 2017-07-14 DIAGNOSIS — Z170 Estrogen receptor positive status [ER+]: Secondary | ICD-10-CM | POA: Diagnosis not present

## 2017-07-14 MED ORDER — LETROZOLE 2.5 MG PO TABS: 2.5000 mg | ORAL_TABLET | Freq: Every day | ORAL | 3 refills | Status: DC

## 2017-07-14 NOTE — Progress Notes (Signed)
[facility] CONSULT NOTE  Patient Care Team: [name], DO as PCP - General (Internal Medicine)  CHIEF COMPLAINTS/PURPOSE OF CONSULTATION:  Newly diagnosed breast cancer  HISTORY OF PRESENTING ILLNESS:  Charlene Rubio 71 y.o. female is here because of recent diagnosis of bilateral breast cancers.  Patient was in the hospital recently in September with congestive heart failure and at that time she was noted to have a lump in the breast.  She was referred to breast center where she underwent mammograms.  It showed bilateral breast masses.  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  She will noted to have enlarged axillary lymph nodes and biopsy of the lymph node on the left side came back as malignant.  She is here today to discuss the entire treatment plan.  She was presented in the multidisciplinary tumor board.  I reviewed her records extensively and collaborated the history with the patient.  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 Acute diastolic (congestive) heart failure ([place]) 04/25/2017  . Acute respiratory failure with hypoxia and hypercapnia ([place]) 04/25/2017  . ALLERGIC RHINITIS 07/01/2007   Qualifier: Diagnosis of  By: Sherren Mocha MD, Jory Ee   . Allergy   . Aortic insufficiency   . Aortic valve disorder 07/01/2007   Qualifier: Diagnosis of  By: Sherren Mocha MD, Dellis Filbert A   . Bilateral leg edema  04/25/2017  . Borderline diabetic   . CHF (congestive heart failure) ([place]) 04/25/2017  . Dementia 04/30/2017  . Diabetes mellitus ([place]) 04/25/2017  . DOE (dyspnea on exertion) 04/25/2017  . Essential hypertension 07/01/2007   Qualifier: Diagnosis of  By: Sherren Mocha MD, Jory Ee Glaucoma suspect   . Hypertension   . KNEE PAIN, LEFT 04/06/2010   Qualifier: Diagnosis of  By: Sherren Mocha MD, Jory Ee   . Obesity   . Obesity, unspecified 07/01/2007   Qualifier: Diagnosis of  By: Sherren Mocha MD, Jory Ee   . Thyroid disease     SURGICAL HISTORY: No past surgical history on file.  SOCIAL HISTORY: Social History   Socioeconomic History  . Marital status: Married    Spouse name: Not on file  . Number of children: Not on file  . Years of education: Not on file  . Highest education level: Not on file  Social Needs  . Financial resource strain: Not on file  . Food insecurity - worry: Not on file  . Food insecurity - inability: Not on file  . Transportation needs - medical: Not on file  . Transportation needs - non-medical: Not on file  Occupational History  . Occupation: retired Building control surveyor  Tobacco Use  . Smoking status: Never Smoker  . Smokeless tobacco: Never Used  Substance and Sexual Activity  . Alcohol use: No  . Drug use: No  . Sexual activity: Not Currently  Other Topics Concern  .  Not on file  Social History Narrative   Social History   Diet?    Do you drink/eat things with caffeine? Yes (minimal) coke   Marital status?          married                          What year were you married? 1968   Do you live in a house, apartment, assisted living, condo, trailer, etc.? house   Is it one or more stories? one   How many persons live in your home? 2   Do you have any pets in your home? (please list) none   Highest level of education completed?   Current or past profession: preschool teacher/nanny   Do you exercise?         0                             Type & how often?   Advanced  Directives   Do you have a living will?   Do you have a DNR form?                                  If not, do you want to discuss one?   Do you have signed POA/HPOA for forms?    Functional Status   Do you have difficulty bathing or dressing yourself?   Do you have difficulty preparing food or eating?    Do you have difficulty managing your medications?   Do you have difficulty managing your finances?   Do you have difficulty affording your medications?    Admitted to [facility] 05/01/17   Never smoked   Alcohol none   DNR       FAMILY HISTORY: Family History  Problem Relation Age of Onset  . Alzheimer's disease Mother 20  . Alzheimer's disease Father 89  . Congestive Heart Failure Father   . Alcoholism Brother   . Alzheimer's disease Brother   . Dementia Brother   . Alcoholism Brother   . Diabetes Brother     ALLERGIES:  has No Known Allergies.  MEDICATIONS:  Current Outpatient Medications  Medication Sig Dispense Refill  . apixaban (ELIQUIS) 5 MG TABS tablet Take 1 tablet (5 mg total) by mouth 2 (two) times daily. 60 tablet 5  . aspirin EC 81 MG EC tablet Take 1 tablet (81 mg total) by mouth daily.    . carvedilol (COREG) 6.25 MG tablet Take 1 tablet (6.25 mg total) by mouth 2 (two) times daily with a meal. 180 tablet 3  . furosemide (LASIX) 40 MG tablet Take 1.5 tablets (60 mg total) by mouth daily. 145 tablet 3  . isosorbide mononitrate (IMDUR) 30 MG 24 hr tablet Take 0.5 tablets (15 mg total) by mouth daily. 45 tablet 3  . letrozole (FEMARA) 2.5 MG tablet Take 1 tablet (2.5 mg total) by mouth daily. 90 tablet 3  . lisinopril (PRINIVIL,ZESTRIL) 10 MG tablet Take 1 tablet (10 mg total) by mouth daily. 90 tablet 3  . metFORMIN (GLUCOPHAGE-XR) 500 MG 24 hr tablet Take 1 tablet (500 mg total) by mouth 2 (two) times daily with a meal. 180 tablet 1   No current facility-administered medications for this visit.     REVIEW OF SYSTEMS:   Constitutional: Denies  fevers,  chills or abnormal night sweats Eyes: Denies blurriness of vision, double vision or watery eyes Ears, nose, mouth, throat, and face: Denies mucositis or sore throat Respiratory: Denies cough, dyspnea or wheezes Cardiovascular: Denies palpitation, chest discomfort or lower extremity swelling Gastrointestinal:  Denies nausea, heartburn or change in bowel habits Skin: Denies abnormal skin rashes Lymphatics: Denies new lymphadenopathy or easy bruising Neurological:Denies numbness, tingling or new weaknesses Behavioral/Psych: Mood is stable, no new changes  Breast: Large palpable mass in the left breast with palpable left axillary enlarged lymph nodes All other systems were reviewed with the patient and are negative.  PHYSICAL EXAMINATION: ECOG PERFORMANCE STATUS: 1 - Symptomatic but completely ambulatory  Vitals:   07/14/17 1008  BP: (!) 207/77  Pulse: 91  Resp: 18  Temp: 98.3 F (36.8 C)  SpO2: 98%   Filed Weights   07/14/17 1008  Weight: 162 lb 3.2 oz (73.6 kg)    GENERAL:alert, no distress and comfortable SKIN: skin color, texture, turgor are normal, no rashes or significant lesions EYES: normal, conjunctiva are pink and non-injected, sclera clear OROPHARYNX:no exudate, no erythema and lips, buccal mucosa, and tongue normal  NECK: supple, thyroid normal size, non-tender, without nodularity LYMPH:  no palpable lymphadenopathy in the cervical, axillary or inguinal LUNGS: clear to auscultation and percussion with normal breathing effort HEART: regular rate & rhythm and no murmurs and no lower extremity edema ABDOMEN:abdomen soft, non-tender and normal bowel sounds Musculoskeletal:no cyanosis of digits and no clubbing  PSYCH: alert & oriented x 3 with fluent speech NEURO: no focal motor/sensory deficits BREAST: Large bulky palpable mass in the left breast.  (exam performed in the presence of a chaperone)   LABORATORY DATA:  I have reviewed the data as listed Lab  Results  Component Value Date   WBC 5.0 05/09/2017   HGB 14.0 05/09/2017   HCT 44 05/09/2017   MCV 96.1 04/30/2017   PLT 139 (A) 05/09/2017   Lab Results  Component Value Date   NA 144 05/09/2017   K 3.8 05/09/2017   CL 96 (L) 04/30/2017   CO2 33 (H) 04/30/2017    RADIOGRAPHIC STUDIES: I have personally reviewed the radiological reports and agreed with the findings in the report.  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, estrogen receptor positive ([place]) 06/23/2017: Left breast 11 o'clock position 6.3 x 4.2 x 6.1 cm mass with thick papillary projections, 2 adjacent left axillary lymph nodes; right breast 930 position 1.7 cm mass; left breast and left lymph node biopsy revealed IDC grade 3, ER 5%, PR 0%, Ki-67 50%, HER-2 positive ratio 2.04 T3N1 stage IIIa; right breast: Grade 1 IDC  ER 95%, PR 95%, Ki-67 15%, HER-2 negative; T1CN0 stage I a  Pathology and radiology counseling: Discussed with the patient, the details of pathology including the type of breast cancer,the clinical staging, the significance of ER, PR and HER-2/neu receptors and the implications for treatment. After reviewing the pathology in detail, we proceeded to discuss the different treatment options between surgery, radiation, chemotherapy, antiestrogen therapies.  Recommendation: 1.  Patient is contemplating on bilateral mastectomies versus left mastectomy and axillary node dissection and right lumpectomy with sentinel lymph node biopsy. 2. adjuvant radiation 3.  Followed by adjuvant antiestrogen therapy. 4.  CT chest abdomen pelvis and bone scan will be obtained for staging.  It appears that there is easily 4 weeks before she could get surgery.  Because of this I started her on oral antiestrogen therapy  with letrozole 2.5 mg daily.  I discussed the risks and benefits of letrozole and patient understands the side effects and is willing to take the medication.  Return  to clinic after surgery to discuss the results of the pathology.   All questions were answered. The patient knows to call the clinic with any problems, questions or concerns.    Rulon Eisenmenger, MD 07/14/17

## 2017-07-14 NOTE — Telephone Encounter (Signed)
Spoke with [facility] and they will contact patient with appointment and directions.per 11/26 los

## 2017-07-14 NOTE — Assessment & Plan Note (Signed)
06/23/2017: Left breast 11 o'clock position 6.3 x 4.2 x 6.1 cm mass with thick papillary projections, 2 adjacent left axillary lymph nodes; right breast 930 position 1.7 cm mass; left breast and left lymph node biopsy revealed IDC grade 3, ER 5%, PR 0%, Ki-67 50%, HER-2 negative T3N1 stage IIIa; right breast: Grade 1 IDC  ER 95%, PR 95%, Ki-67 15%, HER-2 negative; T1CN0 stage I a  Pathology and radiology counseling: Discussed with the patient, the details of pathology including the type of breast cancer,the clinical staging, the significance of ER, PR and HER-2/neu receptors and the implications for treatment. After reviewing the pathology in detail, we proceeded to discuss the different treatment options between surgery, radiation, chemotherapy, antiestrogen therapies.  Recommendation: 1.  Patient is contemplating on bilateral mastectomies versus left mastectomy and axillary node dissection and right lumpectomy with sentinel lymph node biopsy. 2. adjuvant radiation 3.  Followed by adjuvant antiestrogen therapy. 4.  CT chest abdomen pelvis and bone scan will be obtained for staging.  It appears that there is easily 4 weeks before she could get surgery.  Because of this I started her on oral antiestrogen therapy with letrozole 2.5 mg daily.  I discussed the risks and benefits of letrozole and patient understands the side effects and is willing to take the medication.  Return to clinic after surgery to discuss the results of the pathology.

## 2017-07-15 ENCOUNTER — Encounter: Payer: Self-pay | Admitting: Internal Medicine

## 2017-07-15 ENCOUNTER — Ambulatory Visit (INDEPENDENT_AMBULATORY_CARE_PROVIDER_SITE_OTHER): Payer: Medicare Other | Admitting: Internal Medicine

## 2017-07-15 VITALS — BP 182/82 | HR 67 | Temp 98.4°F | Ht 60.0 in | Wt 162.8 lb

## 2017-07-15 DIAGNOSIS — C50811 Malignant neoplasm of overlapping sites of right female breast: Secondary | ICD-10-CM

## 2017-07-15 DIAGNOSIS — I825Y2 Chronic embolism and thrombosis of unspecified deep veins of left proximal lower extremity: Secondary | ICD-10-CM

## 2017-07-15 DIAGNOSIS — I10 Essential (primary) hypertension: Secondary | ICD-10-CM | POA: Diagnosis not present

## 2017-07-15 DIAGNOSIS — C50812 Malignant neoplasm of overlapping sites of left female breast: Secondary | ICD-10-CM

## 2017-07-15 DIAGNOSIS — E118 Type 2 diabetes mellitus with unspecified complications: Secondary | ICD-10-CM

## 2017-07-15 DIAGNOSIS — Z170 Estrogen receptor positive status [ER+]: Secondary | ICD-10-CM

## 2017-07-15 DIAGNOSIS — R413 Other amnesia: Secondary | ICD-10-CM | POA: Diagnosis not present

## 2017-07-15 MED ORDER — DONEPEZIL HCL 5 MG PO TABS: 5.0000 mg | ORAL_TABLET | Freq: Every day | ORAL | 6 refills | Status: DC

## 2017-07-15 NOTE — Progress Notes (Signed)
Patient ID: Charlene Rubio, female   DOB: [birth year], 71 y.o.   MRN: [id number]    Location:  [facility] Place of Service: OFFICE  Chief Complaint  Patient presents with  . Medical Management of Chronic Issues    1 Month Follow up, Patient is here with husband but she is currently mad at him.  . Foot exam    Diabetic Foot Exam Due    HPI:  71 yo female seen today for f/u. Her spouse is present. She had breast bx that revealed cancer. She saw oncology yesterday and plan to have sx if cleared by cardio. She is a poor historian due to memory loss. Hx obtained from chart.  Diastolic heart failure - stable on coreg, lasix, imdur and lisinopril. She also takes eliquis and ASA for anticoagulation. lexiscan Stress test  05/28/17 revealed reduced EF 41% but no ischemic changes. Followed by cardio Dr Georgina Peer. LDL 95  DM - uncontrolled. She takes Metformin. 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 Albumin 3.0.   Past Medical History:  Diagnosis Date  . Acute diastolic (congestive) heart failure ([place]) 04/25/2017  . Acute respiratory failure with hypoxia and hypercapnia ([place]) 04/25/2017  . ALLERGIC RHINITIS 07/01/2007   Qualifier: Diagnosis of  By: Sherren Mocha MD, Jory Ee   . Allergy   . Aortic insufficiency   . Aortic valve disorder 07/01/2007   Qualifier: Diagnosis of  By: Sherren Mocha MD, Dellis Filbert A   . Bilateral leg edema 04/25/2017  . Borderline diabetic   . CHF (congestive heart failure) ([place]) 04/25/2017  . Dementia 04/30/2017  . Diabetes mellitus ([place]) 04/25/2017  . DOE (dyspnea on exertion) 04/25/2017  . Essential hypertension 07/01/2007   Qualifier: Diagnosis of  By: Sherren Mocha MD, Jory Ee Glaucoma suspect   . Hypertension   . KNEE PAIN, LEFT 04/06/2010   Qualifier: Diagnosis of  By: Sherren Mocha MD, Jory Ee   . Obesity   . Obesity, unspecified 07/01/2007   Qualifier: Diagnosis of  By: Sherren Mocha MD, Jory Ee   . Thyroid disease     History reviewed. No pertinent surgical  history.  Patient Care Team: [name], DO as PCP - General (Internal Medicine)  Social History   Socioeconomic History  . Marital status: Married    Spouse name: Not on file  . Number of children: Not on file  . Years of education: Not on file  . Highest education level: Not on file  Social Needs  . Financial resource strain: Not on file  . Food insecurity - worry: Not on file  . Food insecurity - inability: Not on file  . Transportation needs - medical: Not on file  . Transportation needs - non-medical: Not on file  Occupational History  . Occupation: retired Building control surveyor  Tobacco Use  . Smoking status: Never Smoker  . Smokeless tobacco: Never Used  Substance and Sexual Activity  . Alcohol use: No  . Drug use: No  . Sexual activity: Not Currently  Other Topics Concern  . Not on file  Social History Narrative   Social History   Diet?    Do you drink/eat things with caffeine? Yes (minimal) coke   Marital status?          married                          What year were you married? 1968   Do you live  in a house, apartment, assisted living, condo, trailer, etc.? house   Is it one or more stories? one   How many persons live in your home? 2   Do you have any pets in your home? (please list) none   Highest level of education completed?   Current or past profession: preschool teacher/nanny   Do you exercise?         0                             Type & how often?   Advanced Directives   Do you have a living will?   Do you have a DNR form?                                  If not, do you want to discuss one?   Do you have signed POA/HPOA for forms?    Functional Status   Do you have difficulty bathing or dressing yourself?   Do you have difficulty preparing food or eating?    Do you have difficulty managing your medications?   Do you have difficulty managing your finances?   Do you have difficulty affording your medications?    Admitted to [facility] 05/01/17    Never smoked   Alcohol none   DNR        reports that  has never smoked. she has never used smokeless tobacco. She reports that she does not drink alcohol or use drugs.  Family History  Problem Relation Age of Onset  . Alzheimer's disease Mother 27  . Alzheimer's disease Father 81  . Congestive Heart Failure Father   . Alcoholism Brother   . Alzheimer's disease Brother   . Dementia Brother   . Alcoholism Brother   . Diabetes Brother    Family Status  Relation Name Status  . Mother Ophelia Deceased  . Father Kennith [place] Deceased  . Brother [employer]  . Brother [employer]  . Brother Henrene Pastor Deceased  . Son Philmore Pali  . Son [employer]  . Son Abe People Alive     No Known Allergies  Medications:   Medication List        Accurate as of 07/15/17 12:39 PM. Always use your most recent med list.          apixaban 5 MG Tabs tablet Commonly known as:  ELIQUIS Take 1 tablet (5 mg total) by mouth 2 (two) times daily.   aspirin 81 MG EC tablet Take 1 tablet (81 mg total) by mouth daily.   carvedilol 6.25 MG tablet Commonly known as:  COREG Take 1 tablet (6.25 mg total) by mouth 2 (two) times daily with a meal.   furosemide 40 MG tablet Commonly known as:  LASIX Take 1.5 tablets (60 mg total) by mouth daily.   isosorbide mononitrate 30 MG 24 hr tablet Commonly known as:  IMDUR Take 0.5 tablets (15 mg total) by mouth daily.   letrozole 2.5 MG tablet Commonly known as:  FEMARA Take 1 tablet (2.5 mg total) by mouth daily.   lisinopril 10 MG tablet Commonly known as:  PRINIVIL,ZESTRIL Take 1 tablet (10 mg total) by mouth daily.   metFORMIN 500 MG 24 hr tablet Commonly known as:  GLUCOPHAGE-XR Take 1 tablet (500 mg total) by mouth 2 (two) times daily with a meal.  Review of Systems  Unable to perform ROS: Other (memory loss)    Vitals:   07/15/17 1151  BP: (!) 182/82  Pulse: 67  Temp: 98.4 F (36.9 C)  TempSrc: Oral  SpO2: 97%  Weight: 162 lb 12.8 oz  (73.8 kg)  Height: 5' (1.524 m)   Body mass index is 31.79 kg/m.  Physical Exam  Constitutional: She appears well-developed.  Frail appearing in NAD  Cardiovascular: Normal rate and regular rhythm. Exam reveals gallop (S4 gallop).  Murmur (1/6 SEM) heard. L>R +2 pitting LE edema. No calf TTP  Pulmonary/Chest: Effort normal and breath sounds normal. No respiratory distress. She has no wheezes. She has no rales. She exhibits no tenderness.  Musculoskeletal: She exhibits edema and tenderness.  Neurological: She is alert. Gait abnormal.  Skin: Skin is warm and dry. No rash noted.  Psychiatric: She has a normal mood and affect. Her behavior is normal.     Labs reviewed: Hospital Outpatient Visit on 05/28/2017  Component Date Value Ref Range Status  . Rest HR 05/28/2017 72  bpm Final  . Rest BP 05/28/2017 124/76  mmHg Final  . Peak HR 05/28/2017 92  bpm Final  . Peak BP 05/28/2017 151/58  mmHg Final  . SSS 05/28/2017 4   Final  . SRS 05/28/2017 0   Final  . SDS 05/28/2017 4   Final  . TID 05/28/2017 1.04   Final  . LV sys vol 05/28/2017 126  mL Final  . LV dias vol 05/28/2017 213  46 - 106 mL Final  Nursing Home on 05/13/2017  Component Date Value Ref Range Status  . Hemoglobin 05/06/2017 14.7  12.0 - 16.0 Final  . HCT 05/06/2017 46  36 - 46 Final  . Platelets 05/06/2017 161  150 - 399 Final  . WBC 05/06/2017 4.7   Final  . Glucose 05/06/2017 135   Final  . BUN 05/06/2017 17  4 - 21 Final  . Creatinine 05/06/2017 0.6  0.5 - 1.1 Final  . Potassium 05/06/2017 4.5  3.4 - 5.3 Final  . Sodium 05/06/2017 142  137 - 147 Final  . Hemoglobin 05/09/2017 14.0  12.0 - 16.0 Final  . HCT 05/09/2017 44  36 - 46 Final  . Platelets 05/09/2017 139* 150 - 399 Final  . WBC 05/09/2017 5.0   Final  . Glucose 05/09/2017 121   Final  . BUN 05/09/2017 15  4 - 21 Final  . Creatinine 05/09/2017 0.4* 0.5 - 1.1 Final  . Potassium 05/09/2017 3.8  3.4 - 5.3 Final  . Sodium 05/09/2017 144  137 - 147  Final  . Triglycerides 05/09/2017 117  40 - 160 Final  . Cholesterol 05/09/2017 170  0 - 200 Final  . HDL 05/09/2017 52  35 - 70 Final  . LDL Cholesterol 05/09/2017 95   Final  . Alkaline Phosphatase 05/09/2017 72  25 - 125 Final  . ALT 05/09/2017 17  7 - 35 Final  . AST 05/09/2017 16  13 - 35 Final  . Bilirubin, Total 05/09/2017 0.5   Final  . TSH 05/09/2017 4.03  0.41 - 5.90 Final  Admission on 04/25/2017, Discharged on 05/01/2017  No results displayed because visit has over 200 results.    Office Visit on 04/25/2017  Component Date Value Ref Range Status  . Color, UA 04/25/2017 Dark Yellow   Final  . Clarity, UA 04/25/2017 Cloudy   Final  . Glucose, UA 04/25/2017 250   Final  Could be related to dark color of urine   . Bilirubin, UA 04/25/2017 Neg   Final  . Ketones, UA 04/25/2017 Neg   Final  . Spec Grav, UA 04/25/2017 >=1.030* 1.010 - 1.025 Final  . Blood, UA 04/25/2017 Moderate   Final  . pH, UA 04/25/2017 6.0  5.0 - 8.0 Final  . Protein, UA 04/25/2017 300   Final  . Urobilinogen, UA 04/25/2017 2.0* 0.2 or 1.0 E.U./dL Final  . Nitrite, UA 04/25/2017 Neg   Final  . 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 This was found to be concordant by Dr. Franki Cabot. Pathology results were discussed with the patient's husband, Oleta Gunnoe by telephone. Mr. Arkin reported his wife did well after the biopsies with tenderness at the sites. Post biopsy instructions and care were reviewed and questions were answered. Mr. Screws was encouraged to call [facility] for any additional concerns.  Surgical consultation has been arranged with Dr. Fanny Skates at [facility] on July 01, 2017. Pathology results reported by Terie Purser, RN on 06/24/2017. Electronically Signed   By: Franki Cabot M.D.   On: 06/24/2017 15:03   Result Date: 06/24/2017 CLINICAL DATA:  Patient with bilateral breast masses and an enlarged lymph node in the left axilla presents today for ultrasound-guided biopsies of each. EXAM: ULTRASOUND GUIDED BILATERAL BREAST CORE NEEDLE BIOPSY ULTRASOUND-GUIDED LEFT AXILLA CORE NEEDLE BIOPSY COMPARISON:  Previous exam(s). PROCEDURE: I met with the patient and we discussed the procedure of ultrasound-guided biopsy, including benefits and alternatives. We discussed the high likelihood of a successful procedure. We discussed the risks of the procedure including infection, bleeding, tissue injury, clip migration, and inadequate sampling. Informed written consent was given. The usual time-out protocol was performed immediately prior to the procedure. Right breast: Using sterile technique and 1% Lidocaine as local anesthetic, under direct ultrasound visualization, a 12 gauge spring-loaded device was used to perform biopsy of the irregular mass in the right breast at the 9:30 o'clock axisusing a lateral approach. At the conclusion of the procedure, a coil shaped tissue marker clip was deployed into the biopsy cavity. Lesion quadrant:  Upper outer quadrant Left axilla: Next, using sterile technique and 1% lidocaine as local anesthetic, under direct ultrasound visualization, a 14 gauge spring-loaded device was used to perform biopsy of the enlarged/morphologically abnormal lymph node in the left axilla using a lateral approach. At the conclusion of the procedure, a spiral shaped tissue marker clip was deployed into the lymph node. Lesion quadrant:  Left axilla Left breast: Next, using sterile technique and 1% lidocaine as local anesthetic, under direct ultrasound visualization, a 14  gauge spring-loaded device was used to perform biopsy of the solid peripheral components of the complex cystic and solid mass located in the left breast at the 11 o'clock axis using a lateral approach. At the conclusion of the procedure, a ribbon shaped tissue marker clip was placed at the periphery of the complex cystic and solid mass. After core biopsy samples were obtained from the solid peripheral components of the complex cystic and solid mass, 60 cc of sanguinous fluid was aspirated  from the cystic component. A portion was sent to the lab for cytology. Lesion quadrant:  Upper inner quadrant Follow-up 2-view bilateral mammogram was performed and dictated separately. IMPRESSION: 1. Ultrasound-guided biopsy of the irregular mass in the right breast at the 9:30 o'clock axis. Coil shaped clip deployed into the biopsy cavity. 2. Ultrasound-guided biopsy of the enlarged/morphologically abnormal lymph node in the left axilla. Spiral shaped clip placed in the lymph node. 3. Ultrasound-guided biopsy of the complex cystic and solid mass within the left breast at the 11 o'clock axis. Ribbon shaped biopsy clip was placed at the periphery of the complex cystic and solid mass. 4. 60 cc of sanguinous fluid aspirated from the cystic appearing component of the complex cystic and solid left breast mass. A portion was sent to the laboratory for cytology evaluation. Electronically Signed: By: Franki Cabot M.D. On: 06/23/2017 14:52   Mm Clip Placement Left  Result Date: 06/23/2017 CLINICAL DATA:  Status post bilateral ultrasound-guided breast biopsies. Also status post ultrasound-guided biopsy of an enlarged lymph node in the left axilla EXAM: DIAGNOSTIC BILATERAL MAMMOGRAM POST ULTRASOUND BIOPSY x3 COMPARISON:  Previous exam(s). FINDINGS: Mammographic images were obtained following ultrasound guided biopsy of the irregular mass in the right breast at the 9:30 o'clock axis, an enlarged lymph node in the left axilla and a  complex cystic and solid mass within the left breast at the 11 o'clock axis. The coil shaped clip is well-positioned at the site of the targeted right breast mass. The ribbon shaped clip is well-positioned at the periphery of the complex cystic and solid left breast mass. At the conclusion of the ultrasound-guided biopsy, the spiral shaped clip appeared well-positioned within the targeted lymph node in the left axilla. IMPRESSION: 1. COIL shaped clip well-positioned at the site of the targeted RIGHT breast mass, 9:30 o'clock axis. 2. RIBBON shaped clip well-positioned at the periphery of the targeted LEFT breast complex cystic and solid mass, 11 o'clock axis. 3. SPIRAL shaped biopsy clip well-positioned within the targeted LEFT axillary lymph node (by ultrasound). Final Assessment: Post Procedure Mammograms for Marker Placement Electronically Signed   By: Franki Cabot M.D.   On: 06/23/2017 14:50   Mm Clip Placement Right  Result Date: 06/23/2017 CLINICAL DATA:  Status post bilateral ultrasound-guided breast biopsies. Also status post ultrasound-guided biopsy of an enlarged lymph node in the left axilla EXAM: DIAGNOSTIC BILATERAL MAMMOGRAM POST ULTRASOUND BIOPSY x3 COMPARISON:  Previous exam(s). FINDINGS: Mammographic images were obtained following ultrasound guided biopsy of the irregular mass in the right breast at the 9:30 o'clock axis, an enlarged lymph node in the left axilla and a complex cystic and solid mass within the left breast at the 11 o'clock axis. The coil shaped clip is well-positioned at the site of the targeted right breast mass. The ribbon shaped clip is well-positioned at the periphery of the complex cystic and solid left breast mass. At the conclusion of the ultrasound-guided biopsy, the spiral shaped clip appeared well-positioned within the targeted lymph node in the left axilla. IMPRESSION: 1. COIL shaped clip well-positioned at the site of the targeted RIGHT breast mass, 9:30 o'clock  axis. 2. RIBBON shaped clip well-positioned at the periphery of the targeted LEFT breast complex cystic and solid mass, 11 o'clock axis. 3. SPIRAL shaped biopsy clip well-positioned within the targeted LEFT axillary lymph node (by ultrasound). Final Assessment: Post Procedure Mammograms for Marker Placement Electronically Signed   By: Franki Cabot M.D.   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 This was found to be concordant by Dr. Franki Cabot. Pathology results were discussed with the patient's husband, Shoshanna Mcquitty by telephone. Mr. Purdum reported his wife did well after the biopsies with tenderness at the sites. Post biopsy instructions and care were reviewed and questions were answered. Mr. Tuch was encouraged to call [facility] for any additional concerns. Surgical consultation has been arranged with Dr. Fanny Skates at [facility] on July 01, 2017. Pathology results reported by Terie Purser, RN on 06/24/2017. Electronically Signed   By: Franki Cabot M.D.   On: 06/24/2017 15:03   Result Date: 06/24/2017 CLINICAL DATA:  Patient with bilateral breast masses and an enlarged lymph node in the left axilla presents today for ultrasound-guided biopsies of each. EXAM: ULTRASOUND GUIDED BILATERAL BREAST CORE NEEDLE BIOPSY ULTRASOUND-GUIDED LEFT AXILLA CORE NEEDLE BIOPSY COMPARISON:  Previous exam(s). PROCEDURE: I met with the patient and we discussed the procedure of ultrasound-guided biopsy, including benefits and alternatives. We discussed the high likelihood of a successful procedure. We discussed the risks of the procedure including  infection, bleeding, tissue injury, clip migration, and inadequate sampling. Informed written consent was given. The usual time-out protocol was performed immediately prior to the procedure. Right breast: Using sterile technique and 1% Lidocaine as local anesthetic, under direct ultrasound visualization, a 12 gauge spring-loaded device was used to perform biopsy of the irregular mass in the right breast at the 9:30 o'clock axisusing a lateral approach. At the conclusion of the procedure, a coil shaped tissue marker clip was deployed into the biopsy cavity. Lesion quadrant:  Upper outer quadrant Left axilla: Next, using sterile technique and 1% lidocaine as local anesthetic, under direct ultrasound visualization, a 14 gauge spring-loaded device was used to perform biopsy of the enlarged/morphologically abnormal lymph node in the left axilla using a lateral approach. At the conclusion of the procedure, a spiral shaped tissue marker clip was deployed into the lymph node. Lesion quadrant:  Left axilla Left breast: Next, using sterile technique and 1% lidocaine as local anesthetic, under direct ultrasound visualization, a 14 gauge spring-loaded device was used to perform biopsy of the solid peripheral components of the complex cystic and solid mass located in the left breast at the 11 o'clock axis using a lateral approach. At the conclusion of the procedure, a ribbon shaped tissue marker clip was placed at the periphery of the complex cystic and solid mass. After core biopsy samples were obtained from the solid peripheral components of the complex cystic and solid mass, 60 cc of sanguinous fluid was aspirated from the cystic component. A portion was sent to the lab for cytology. Lesion quadrant:  Upper inner quadrant Follow-up 2-view bilateral mammogram was performed and dictated separately. IMPRESSION: 1. Ultrasound-guided biopsy of the irregular mass in the right breast at the 9:30 o'clock axis. Coil shaped clip  deployed into the biopsy cavity. 2. Ultrasound-guided biopsy of the enlarged/morphologically abnormal lymph node in the left axilla. Spiral shaped clip placed in the lymph node. 3. Ultrasound-guided biopsy of the complex cystic and solid mass within the left breast at the 11 o'clock axis. Ribbon shaped biopsy clip was placed at the periphery  of the complex cystic and solid mass. 4. 60 cc of sanguinous fluid aspirated from the cystic appearing component of the complex cystic and solid left breast mass. A portion was sent to the laboratory for cytology evaluation. Electronically Signed: By: Franki Cabot M.D. 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 This was found to be concordant by Dr. Franki Cabot. Pathology results were discussed with the patient's husband, Essense Bousquet by telephone. Mr. Slabach reported his wife did well after the biopsies with tenderness at the sites. Post biopsy instructions and care were reviewed and questions were answered. Mr. Aaron was encouraged to call [facility] for any additional concerns. Surgical consultation has been arranged with Dr. Fanny Skates at [facility] on July 01, 2017. Pathology results reported by Terie Purser, RN on 06/24/2017. Electronically Signed   By: Franki Cabot M.D.   On: 06/24/2017 15:03   Result Date: 06/24/2017 CLINICAL DATA:  Patient with bilateral breast masses and an enlarged lymph node in the left axilla presents today for ultrasound-guided biopsies of each. EXAM: ULTRASOUND GUIDED BILATERAL BREAST CORE NEEDLE BIOPSY ULTRASOUND-GUIDED LEFT AXILLA CORE NEEDLE BIOPSY COMPARISON:   Previous exam(s). PROCEDURE: I met with the patient and we discussed the procedure of ultrasound-guided biopsy, including benefits and alternatives. We discussed the high likelihood of a successful procedure. We discussed the risks of the procedure including infection, bleeding, tissue injury, clip migration, and inadequate sampling. Informed written consent was given. The usual time-out protocol was performed immediately prior to the procedure. Right breast: Using sterile technique and 1% Lidocaine as local anesthetic, under direct ultrasound visualization, a 12 gauge spring-loaded device was used to perform biopsy of the irregular mass in the right breast at the 9:30 o'clock axisusing a lateral approach. At the conclusion of the procedure, a coil shaped tissue marker clip was deployed into the biopsy cavity. Lesion quadrant:  Upper outer quadrant Left axilla: Next, using sterile technique and 1% lidocaine as local anesthetic, under direct ultrasound visualization, a 14 gauge spring-loaded device was used to perform biopsy of the enlarged/morphologically abnormal lymph node in the left axilla using a lateral approach. At the conclusion of the procedure, a spiral shaped tissue marker clip was deployed into the lymph node. Lesion quadrant:  Left axilla Left breast: Next, using sterile technique and 1% lidocaine as local anesthetic, under direct ultrasound visualization, a 14 gauge spring-loaded device was used to perform biopsy of the solid peripheral components of the complex cystic and solid mass located in the left breast at the 11 o'clock axis using a lateral approach. At the conclusion of the procedure, a ribbon shaped tissue marker clip was placed at the periphery of the complex cystic and solid mass. After core biopsy samples were obtained from the solid peripheral components of the complex cystic and solid mass, 60 cc of sanguinous fluid was aspirated from the cystic component. A portion was sent to the lab  for cytology. Lesion quadrant:  Upper inner quadrant Follow-up 2-view bilateral mammogram was performed and dictated separately. IMPRESSION: 1. Ultrasound-guided biopsy of the irregular mass in the right breast at the 9:30 o'clock axis. Coil shaped clip deployed into  the biopsy cavity. 2. Ultrasound-guided biopsy of the enlarged/morphologically abnormal lymph node in the left axilla. Spiral shaped clip placed in the lymph node. 3. Ultrasound-guided biopsy of the complex cystic and solid mass within the left breast at the 11 o'clock axis. Ribbon shaped biopsy clip was placed at the periphery of the complex cystic and solid mass. 4. 60 cc of sanguinous fluid aspirated from the cystic appearing component of the complex cystic and solid left breast mass. A portion was sent to the laboratory for cytology evaluation. Electronically Signed: By: Franki Cabot M.D. On: 06/23/2017 14:52     Assessment/Plan   ICD-10-CM   1. Memory loss or impairment R41.3 donepezil (ARICEPT) 5 MG tablet  2. 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 Chronic deep vein thrombosis (DVT) of proximal vein of left lower extremity (HCC) I82.5Y2   4. Type 2 diabetes mellitus with complication, without long-term current use of insulin (HCC) E11.8 Hemoglobin A1c  5. Essential hypertension I10    KEEP LEGS ELEVATED WHEN SEATED TO REDUCE SWELLING IN LEGS. WEAR TED STOCKINGS DAILY FOR SWELLING  START ARICEPT 5MG AT BEDTIME FOR MEMORY LOSS  Continue other medications as ordered  Will call with lab results  Follow up for imaging studies as scheduled  Follow up with oncology as scheduled. Will need surgical eval and cardiac clearance  Follow up with neurology as scheduled  Follow up in 2 mos for memory loss.   Kraig Genis S. Perlie Gold  [facility] [address] [place], [place]  [address] [phone] Cell (Monday-Friday 8 AM - 5 PM) [phone] After 5 PM and follow prompts

## 2017-07-15 NOTE — Patient Instructions (Signed)
KEEP LEGS ELEVATED WHEN SEATED TO REDUCE SWELLING IN LEGS. WEAR TED STOCKINGS DAILY FOR SWELLING  START ARICEPT 5MG  AT BEDTIME FOR MEMORY LOSS  Continue other medications as ordered  Will call with lab results  Follow up for imaging studies as scheduled  Follow up with oncology as scheduled. Will need surgical eval and cardiac clearance  Follow up with neurology as scheduled  Follow up in 2 mos for memory loss.

## 2017-07-16 ENCOUNTER — Other Ambulatory Visit: Payer: Self-pay

## 2017-07-16 ENCOUNTER — Other Ambulatory Visit: Payer: Medicare Other

## 2017-07-16 ENCOUNTER — Encounter: Payer: Self-pay | Admitting: Internal Medicine

## 2017-07-16 DIAGNOSIS — E118 Type 2 diabetes mellitus with unspecified complications: Secondary | ICD-10-CM | POA: Diagnosis not present

## 2017-07-16 DIAGNOSIS — N6320 Unspecified lump in the left breast, unspecified quadrant: Secondary | ICD-10-CM

## 2017-07-17 LAB — HEMOGLOBIN A1C
EAG (MMOL/L): 7.9 (calc)
Hgb A1c MFr Bld: 6.6 % of total Hgb — ABNORMAL HIGH (ref <5.7)
Mean Plasma Glucose: 143 (calc)

## 2017-07-18 ENCOUNTER — Other Ambulatory Visit (HOSPITAL_BASED_OUTPATIENT_CLINIC_OR_DEPARTMENT_OTHER): Payer: Medicare Other

## 2017-07-18 DIAGNOSIS — C50212 Malignant neoplasm of upper-inner quadrant of left female breast: Secondary | ICD-10-CM

## 2017-07-18 DIAGNOSIS — C773 Secondary and unspecified malignant neoplasm of axilla and upper limb lymph nodes: Secondary | ICD-10-CM | POA: Diagnosis not present

## 2017-07-18 DIAGNOSIS — C50811 Malignant neoplasm of overlapping sites of right female breast: Secondary | ICD-10-CM | POA: Diagnosis not present

## 2017-07-18 DIAGNOSIS — N6320 Unspecified lump in the left breast, unspecified quadrant: Secondary | ICD-10-CM

## 2017-07-18 LAB — BASIC METABOLIC PANEL
Anion Gap: 9 mEq/L (ref 3–11)
BUN: 13.3 mg/dL (ref 7.0–26.0)
CO2: 25 mEq/L (ref 22–29)
Calcium: 8.9 mg/dL (ref 8.4–10.4)
Chloride: 108 mEq/L (ref 98–109)
Creatinine: 0.7 mg/dL (ref 0.6–1.1)
EGFR: >60 mL/min/{1.73_m2} (ref >60)
Glucose: 120 mg/dl (ref 70–140)
Potassium: 3.7 mEq/L (ref 3.5–5.1)
Sodium: 143 mEq/L (ref 136–145)

## 2017-07-22 ENCOUNTER — Encounter (HOSPITAL_COMMUNITY)
Admission: RE | Admit: 2017-07-22 | Discharge: 2017-07-22 | Disposition: A | Discharge status: Home / Self Care | Payer: Medicare Other | Source: Ambulatory Visit | Attending: Hematology and Oncology | Admitting: Hematology and Oncology

## 2017-07-22 ENCOUNTER — Ambulatory Visit (HOSPITAL_COMMUNITY)
Admission: RE | Admit: 2017-07-22 | Discharge: 2017-07-22 | Disposition: A | Discharge status: Home / Self Care | Payer: Medicare Other | Source: Ambulatory Visit | Attending: Hematology and Oncology | Admitting: Hematology and Oncology

## 2017-07-22 ENCOUNTER — Encounter (HOSPITAL_COMMUNITY): Payer: Self-pay

## 2017-07-22 DIAGNOSIS — R590 Localized enlarged lymph nodes: Secondary | ICD-10-CM | POA: Diagnosis not present

## 2017-07-22 DIAGNOSIS — M12859 Other specific arthropathies, not elsewhere classified, unspecified hip: Secondary | ICD-10-CM | POA: Diagnosis not present

## 2017-07-22 DIAGNOSIS — M4185 Other forms of scoliosis, thoracolumbar region: Secondary | ICD-10-CM | POA: Insufficient documentation

## 2017-07-22 DIAGNOSIS — C50811 Malignant neoplasm of overlapping sites of right female breast: Secondary | ICD-10-CM

## 2017-07-22 DIAGNOSIS — C50812 Malignant neoplasm of overlapping sites of left female breast: Principal | ICD-10-CM

## 2017-07-22 DIAGNOSIS — R911 Solitary pulmonary nodule: Secondary | ICD-10-CM | POA: Diagnosis not present

## 2017-07-22 DIAGNOSIS — N83202 Unspecified ovarian cyst, left side: Secondary | ICD-10-CM | POA: Diagnosis not present

## 2017-07-22 DIAGNOSIS — D3501 Benign neoplasm of right adrenal gland: Secondary | ICD-10-CM | POA: Insufficient documentation

## 2017-07-22 DIAGNOSIS — I700 Atherosclerosis of aorta: Secondary | ICD-10-CM | POA: Insufficient documentation

## 2017-07-22 DIAGNOSIS — C50911 Malignant neoplasm of unspecified site of right female breast: Secondary | ICD-10-CM | POA: Diagnosis not present

## 2017-07-22 DIAGNOSIS — I517 Cardiomegaly: Secondary | ICD-10-CM | POA: Diagnosis not present

## 2017-07-22 DIAGNOSIS — I712 Thoracic aortic aneurysm, without rupture: Secondary | ICD-10-CM | POA: Diagnosis not present

## 2017-07-22 DIAGNOSIS — Z170 Estrogen receptor positive status [ER+]: Principal | ICD-10-CM

## 2017-07-22 DIAGNOSIS — C50912 Malignant neoplasm of unspecified site of left female breast: Secondary | ICD-10-CM | POA: Diagnosis not present

## 2017-07-22 DIAGNOSIS — I714 Abdominal aortic aneurysm, without rupture: Secondary | ICD-10-CM | POA: Diagnosis not present

## 2017-07-22 MED ORDER — IOPAMIDOL (ISOVUE-300) INJECTION 61%
INTRAVENOUS | Status: AC
  Administered 2017-07-22: 100 mL via INTRAVENOUS
  Filled 2017-07-22: qty 100

## 2017-07-22 MED ORDER — TECHNETIUM TC 99M MEDRONATE IV KIT
21.3000 | PACK | Freq: Once | INTRAVENOUS | Status: AC | PRN
  Administered 2017-07-22: 21.3 via INTRAVENOUS

## 2017-07-22 MED ORDER — IOPAMIDOL (ISOVUE-300) INJECTION 61%
100.0000 mL | Freq: Once | INTRAVENOUS | Status: AC | PRN
  Administered 2017-07-22: 100 mL via INTRAVENOUS

## 2017-07-25 ENCOUNTER — Encounter: Payer: Self-pay | Admitting: Hematology and Oncology

## 2017-07-25 ENCOUNTER — Ambulatory Visit (HOSPITAL_COMMUNITY)
Admission: RE | Admit: 2017-07-25 | Discharge: 2017-07-25 | Disposition: A | Discharge status: Home / Self Care | Payer: Medicare Other | Source: Ambulatory Visit | Attending: Cardiovascular Disease | Admitting: Cardiovascular Disease

## 2017-07-25 ENCOUNTER — Encounter: Payer: Self-pay | Admitting: Cardiovascular Disease

## 2017-07-25 DIAGNOSIS — H348312 Tributary (branch) retinal vein occlusion, right eye, stable: Secondary | ICD-10-CM | POA: Diagnosis not present

## 2017-07-25 DIAGNOSIS — I6523 Occlusion and stenosis of bilateral carotid arteries: Secondary | ICD-10-CM | POA: Insufficient documentation

## 2017-07-29 ENCOUNTER — Telehealth: Payer: Self-pay | Admitting: Hematology and Oncology

## 2017-07-29 NOTE — Telephone Encounter (Signed)
I called the patient's husband and informed him of her scan results. There is no evidence of distant metastatic disease. I went over the other abnormal findings noted on the scan. They have gone to the cardiologist and are awaiting the impression of cardiologist in terms of whether or not she can tolerate surgery. They also have an appointment to see Dr. Dalbert Batman who will make the final decision.

## 2017-07-30 ENCOUNTER — Encounter: Payer: Self-pay | Admitting: Neurology

## 2017-07-30 ENCOUNTER — Ambulatory Visit: Payer: Medicare Other | Admitting: Neurology

## 2017-07-30 VITALS — BP 184/68 | HR 85 | Ht 60.0 in | Wt 159.0 lb

## 2017-07-30 DIAGNOSIS — F03A0 Unspecified dementia, mild, without behavioral disturbance, psychotic disturbance, mood disturbance, and anxiety: Secondary | ICD-10-CM

## 2017-07-30 DIAGNOSIS — F0390 Unspecified dementia without behavioral disturbance: Secondary | ICD-10-CM | POA: Diagnosis not present

## 2017-07-30 DIAGNOSIS — C50811 Malignant neoplasm of overlapping sites of right female breast: Secondary | ICD-10-CM | POA: Diagnosis not present

## 2017-07-30 DIAGNOSIS — R413 Other amnesia: Secondary | ICD-10-CM

## 2017-07-30 DIAGNOSIS — Z170 Estrogen receptor positive status [ER+]: Secondary | ICD-10-CM

## 2017-07-30 DIAGNOSIS — C50812 Malignant neoplasm of overlapping sites of left female breast: Secondary | ICD-10-CM

## 2017-07-30 MED ORDER — DONEPEZIL HCL 10 MG PO TABS: 10.0000 mg | ORAL_TABLET | Freq: Every day | ORAL | 6 refills | Status: DC
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
## 2017-07-30 NOTE — Patient Instructions (Signed)
1. Schedule MRI brain with and without contrast 2. Increase Aricept to 10mg  daily. With your current bottle of Aricept 5mg , take 2 tablets daily. Once done with your bottle, your new bottle of Aricept 10mg : take 1 tablet daily 3. Follow-up 3 months, call for any changes  FALL PRECAUTIONS: Be cautious when walking. Scan the area for obstacles that may increase the risk of trips and falls. When getting up in the mornings, sit up at the edge of the bed for a few minutes before getting out of bed. Consider elevating the bed at the head end to avoid drop of blood pressure when getting up. Walk always in a well-lit room (use night lights in the walls). Avoid area rugs or power cords from appliances in the middle of the walkways. Use a walker or a cane if necessary and consider physical therapy for balance exercise. Get your eyesight checked regularly.  FINANCIAL OVERSIGHT: Supervision, especially oversight when making financial decisions or transactions is also recommended.  HOME SAFETY: Consider the safety of the kitchen when operating appliances like stoves, microwave oven, and blender. Consider having supervision and share cooking responsibilities until no longer able to participate in those. Accidents with firearms and other hazards in the house should be identified and addressed as well.  DRIVING: Regarding driving, in patients with progressive memory problems, driving will be impaired. We advise to have someone else do the driving if trouble finding directions or if minor accidents are reported. Independent driving assessment is available to determine safety of driving.  ABILITY TO BE LEFT ALONE: If patient is unable to contact 911 operator, consider using LifeLine, or when the need is there, arrange for someone to stay with patients. Smoking is a fire hazard, consider supervision or cessation. Risk of wandering should be assessed by caregiver and if detected at any point, supervision and safe proof  recommendations should be instituted.  MEDICATION SUPERVISION: Inability to self-administer medication needs to be constantly addressed. Implement a mechanism to ensure safe administration of the medications.  RECOMMENDATIONS FOR ALL PATIENTS WITH MEMORY PROBLEMS: 1. Continue to exercise (Recommend 30 minutes of walking everyday, or 3 hours every week) 2. Increase social interactions - continue going to [place] and enjoy social gatherings with friends and family 3. Eat healthy, avoid fried foods and eat more fruits and vegetables 4. Maintain adequate blood pressure, blood sugar, and blood cholesterol level. Reducing the risk of stroke and cardiovascular disease also helps promoting better memory. 5. Avoid stressful situations. Live a simple life and avoid aggravations. Organize your time and prepare for the next day in anticipation. 6. Sleep well, avoid any interruptions of sleep and avoid any distractions in the bedroom that may interfere with adequate sleep quality 7. Avoid sugar, avoid sweets as there is a strong link between excessive sugar intake, diabetes, and cognitive impairment We discussed the Mediterranean diet, which has been shown to help patients reduce the risk of progressive memory disorders and reduces cardiovascular risk. This includes eating fish, eat fruits and green leafy vegetables, nuts like almonds and hazelnuts, walnuts, and also use olive oil. Avoid fast foods and fried foods as much as possible. Avoid sweets and sugar as sugar use has been linked to worsening of memory function.  There is always a concern of gradual progression of memory problems. If this is the case, then we may need to adjust level of care according to patient needs. Support, both to the patient and caregiver, should then be put into place.

## 2017-08-01 NOTE — Telephone Encounter (Signed)
Tried to call pt still needs pre-op appt, Lm2cb

## 2017-08-04 ENCOUNTER — Encounter: Payer: Self-pay | Admitting: *Deleted

## 2017-08-04 ENCOUNTER — Other Ambulatory Visit: Payer: Self-pay | Admitting: General Surgery

## 2017-08-04 DIAGNOSIS — Z170 Estrogen receptor positive status [ER+]: Principal | ICD-10-CM

## 2017-08-04 DIAGNOSIS — C50811 Malignant neoplasm of overlapping sites of right female breast: Secondary | ICD-10-CM

## 2017-08-04 DIAGNOSIS — R413 Other amnesia: Secondary | ICD-10-CM | POA: Diagnosis not present

## 2017-08-04 DIAGNOSIS — C50919 Malignant neoplasm of unspecified site of unspecified female breast: Secondary | ICD-10-CM | POA: Diagnosis not present

## 2017-08-04 DIAGNOSIS — C50911 Malignant neoplasm of unspecified site of right female breast: Secondary | ICD-10-CM | POA: Diagnosis not present

## 2017-08-04 DIAGNOSIS — C50812 Malignant neoplasm of overlapping sites of left female breast: Principal | ICD-10-CM

## 2017-08-04 DIAGNOSIS — C773 Secondary and unspecified malignant neoplasm of axilla and upper limb lymph nodes: Secondary | ICD-10-CM | POA: Diagnosis not present

## 2017-08-04 DIAGNOSIS — C50912 Malignant neoplasm of unspecified site of left female breast: Secondary | ICD-10-CM | POA: Diagnosis not present

## 2017-08-14 ENCOUNTER — Ambulatory Visit
Admission: RE | Admit: 2017-08-14 | Discharge: 2017-08-14 | Disposition: A | Discharge status: Home / Self Care | Payer: Medicare Other | Source: Ambulatory Visit | Attending: Neurology | Admitting: Neurology

## 2017-08-14 DIAGNOSIS — R410 Disorientation, unspecified: Secondary | ICD-10-CM | POA: Diagnosis not present

## 2017-08-14 DIAGNOSIS — R413 Other amnesia: Secondary | ICD-10-CM

## 2017-08-14 MED ORDER — GADOBENATE DIMEGLUMINE 529 MG/ML IV SOLN
14.0000 mL | Freq: Once | INTRAVENOUS | Status: AC | PRN
  Administered 2017-08-14: 14 mL via INTRAVENOUS

## 2017-08-20 ENCOUNTER — Telehealth: Payer: Self-pay

## 2017-08-20 NOTE — Telephone Encounter (Signed)
Spoke with pt's husband, [name], relaying message below.  He asked if pt's PCP would be able to view these images - I let him know that if PCP is within the [facility] that yes, they will be able to see these results.  [name] states that he will follow up with PCP about BP and Cholesterol.

## 2017-08-20 NOTE — Telephone Encounter (Signed)
-----   Message from Cameron Sprang, MD sent at 08/20/2017 12:31 PM EST ----- Pls let husband know the MRI brain did not show any evidence of tumor, bleed, or new stroke. It showed age-related changes, and changes seen in patients with blood pressure and cholesterol issues. Thanks

## 2017-08-22 NOTE — Pre-Procedure Instructions (Signed)
MILI PILTZ  08/22/2017      [id number] - [place], [address] [place] [address] Phone: [phone] Fax: [phone]    Your procedure is scheduled on Wednesday, August 27, 2017  Report to [facility] Admitting Entrance "A" at 8:30AM   Call this number if you have problems the morning of surgery:  [phone]   Remember:  Do not eat food or drink liquids after midnight.  Take these medicines the morning of surgery with A SIP OF WATER: Carvedilol (COREG), Isosorbide mononitrate (IMDUR), and Letrozole [facility]).  Follow your doctor's instruction regarding Aspirin and Eliquis.  As of today, stop taking all Aspirins, Vitamins, Fish oils, and Herbal medications. Also stop all NSAIDS i.e. Advil, Ibuprofen, Motrin, Aleve, Anaprox, Naproxen, BC and Goody Powders.  Please complete your PRE-SURGERY ENSURE that was given to before you leave your house the morning of surgery.  Please, if able, drink it in one setting. DO NOT SIP.  How to Manage Your Diabetes Before and After Surgery  Why is it important to control my blood sugar before and after surgery? . Improving blood sugar levels before and after surgery helps healing and can limit problems. . A way of improving blood sugar control is eating a healthy diet by: o  Eating less sugar and carbohydrates o  Increasing activity/exercise o  Talking with your doctor about reaching your blood sugar goals . High blood sugars (greater than 180 mg/dL) can raise your risk of infections and slow your recovery, so you will need to focus on controlling your diabetes during the weeks before surgery. . Make sure that the doctor who takes care of your diabetes knows about your planned surgery including the date and location.  How do I manage my blood sugar before surgery? . Check your blood sugar at least 4 times a day, starting 2 days before surgery, to make sure that the level is not too high or  low. o Check your blood sugar the morning of your surgery when you wake up and every 2 hours until you get to the Short Stay unit. . If your blood sugar is less than 70 mg/dL, you will need to treat for low blood sugar: o Do not take insulin. o Treat a low blood sugar (less than 70 mg/dL) with  cup of clear juice (cranberry or apple), 4 glucose tablets, OR glucose gel. Recheck blood sugar in 15 minutes after treatment (to make sure it is greater than 70 mg/dL). If your blood sugar is not greater than 70 mg/dL on recheck, call [phone] o  for further instructions. . Report your blood sugar to the short stay nurse when you get to Short Stay.  . If you are admitted to the hospital after surgery: o Your blood sugar will be checked by the staff and you will probably be given insulin after surgery (instead of oral diabetes medicines) to make sure you have good blood sugar levels. o The goal for blood sugar control after surgery is 80-180 mg/dL.  WHAT DO I DO ABOUT MY DIABETES MEDICATION?  Marland Kitchen Do not take MetFORMIN (GLUCOPHAGE-XR) the morning of surgery.  . If your CBG is greater than 220 mg/dL, call us at [phone]   Do not wear jewelry, make-up or nail polish.  Do not wear lotions, powders, perfumes, or deodorant.  Do not shave 48 hours prior to surgery.   Do not bring valuables to the hospital.  [facility]  is not responsible for any belongings or valuables.  Contacts, dentures or bridgework may not be worn into surgery.  Leave your suitcase in the car.  After surgery it may be brought to your room.  For patients admitted to the hospital, discharge time will be determined by your treatment team.  Patients discharged the day of surgery will not be allowed to drive home.   Special instructions:   [place]- Preparing For Surgery  Before surgery, you can play an important role. Because skin is not sterile, your skin needs to be as free of germs as possible. You can reduce the  number of germs on your skin by washing with CHG (chlorahexidine gluconate) Soap before surgery.  CHG is an antiseptic cleaner which kills germs and bonds with the skin to continue killing germs even after washing.  Please do not use if you have an allergy to CHG or antibacterial soaps. If your skin becomes reddened/irritated stop using the CHG.  Do not shave (including legs and underarms) for at least 48 hours prior to first CHG shower. It is OK to shave your face.  Please follow these instructions carefully.   1. Shower the NIGHT BEFORE SURGERY and the MORNING OF SURGERY with CHG.   2. If you chose to wash your hair, wash your hair first as usual with your normal shampoo.  3. After you shampoo, rinse your hair and body thoroughly to remove the shampoo.  4. Use CHG as you would any other liquid soap. You can apply CHG directly to the skin and wash gently with a scrungie or a clean washcloth.   5. Apply the CHG Soap to your body ONLY FROM THE NECK DOWN.  Do not use on open wounds or open sores. Avoid contact with your eyes, ears, mouth and genitals (private parts). Wash Face and genitals (private parts)  with your normal soap.  6. Wash thoroughly, paying special attention to the area where your surgery will be performed.  7. Thoroughly rinse your body with warm water from the neck down.  8. DO NOT shower/wash with your normal soap after using and rinsing off the CHG Soap.  9. Pat yourself dry with a CLEAN TOWEL.  10. Wear CLEAN PAJAMAS to bed the night before surgery, wear comfortable clothes the morning of surgery  11. Place CLEAN SHEETS on your bed the night of your first shower and DO NOT SLEEP WITH PETS.  Day of Surgery: Do not apply any deodorants/lotions. Please wear clean clothes to the hospital/surgery center.    Please read over the following fact sheets that you were given. Pain Booklet, Coughing and Deep Breathing and Surgical Site Infection Prevention

## 2017-08-24 NOTE — H&P (Signed)
Charlene Rubio Location: [facility] Surgery Patient #: [id number] DOB: [birth year] Married / Language: English / Race: White Female        History of Present Illness     . This is a 72 year old female who returns for a second visit to discuss surgicalk  management of her bilateral breast cancer. Charlene Rubio is her PCP. Charlene Rubio is her cardiologist. Dr. Ellouise Rubio is her neurologist. She has now seen Dr. Lindi Rubio at the cancer center. Her husband and daughter-in-law are here with her throughout the encounter      She has some memory problems and early dementia and has been started on Aricept. He noticed a lump in her left breast a couple of months ago. History is not that good. She was hospitalized earlier this year for congestive heart failure which has gotten much better imaging study showed a palpable 6.3 cm mass in the left breast, retroareolar area superiorly. Imaging studies also show abnormal left axillary lymph nodes. On the right side she had a 1.7 cm mass in the 9:30 position.      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 Image guided biopsy of left axillary lymph node shows metastatic breast cancer. Image guided biopsy of the right breast 9:30 position shows invasive ductal carcinoma, grade 1, ER and PR strongly positive. HER-2 negative.      Comorbidities include memory disorder, lives with her husband and takes care of herself does not drive. Some balance problems. Hypertension. Recent fall with right periorbital ecchymoses. Diastolic CHF with aortic disease. Recent stress test May 28, 2017 with ejection fraction 41%. Denies chest pain or shortness of breath. Non-insulin-dependent diabetes mellitus. Anticoagulated with eliquis due to risk of recurrent left lower extremity DVT. Chronic lower extremity edema. Family history negative for breast or ovarian  cancer. Social history she lives with her husband has 3 sons does not drive denies alcohol or tobacco. 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     She has seen Dr. Lindi Rubio. Staging scans are negative. He started her on letrozole.       Charlene Rubio and his office staff stated that she is acceptable risk and we can stop the Eliquis 2 or 3 days preop She has seen Charlene Rubio at neurology and has been started on Aricept. No neurological contraindications to breast surgery     We talked a long time about her disease process. We talked about surgical options. On the left side she needs a modified radical mastectomy. On the right side she could have mastectomy with sentinel node or lumpectomy sentinel node and radiation therapy. After going through all of this it seemed simpler to do bilateral mastectomies and also would most likely achieve the best symmetry with postop mastectomy bra and prosthesis. The patient and her husband and daughter-in-law are in favor of this. We discussed the indications, details, technique, and numerous risk of the surgery. They're all aware of the risk of bleeding, infection, reoperation for complications, arm swelling, shoulder disability, nerve damage. All aware of the possible to cardiac pulmonary or thromboembolic problems. There ware of the risk of neurologic worsening and possible nursing home placement. Hopefully she will do reasonably well and be able to go home with home health therapies.  Plan: Discontinue Eliquis 3 days preop MRI brain scheduled December 27 Scheduled for left modified radical mastectomy, right total mastectomy, right axillary sentinel lymph  node biopsy She will be admitted to come hospital postop for observation Most likely she will be offered radiation therapy to the left chest wall Most likely she is not a candidate for chemotherapy due to her heart disease       Allergies  No Known Drug Allergies  [08/04/2017]: Allergies Reconciled   Medication History  Carvedilol (6.'25MG'$  Tablet, Oral) Active. MetFORMIN HCl ER ('500MG'$  Tablet ER 24HR, Oral) Active. Eliquis ('5MG'$  Tablet, Oral) Active. Furosemide ('40MG'$  Tablet, Oral) Active. Isosorbide Mononitrate ER ('30MG'$  Tablet ER 24HR, Oral) Active. Lisinopril ('10MG'$  Tablet, Oral) Active. Aspirin EC ('81MG'$  Tablet DR, Oral) Active. Donepezil HCl ('10MG'$  Tablet, Oral) Active. Donepezil HCl ('5MG'$  Tablet, Oral) Active. Letrozole (2.'5MG'$  Tablet, Oral) Active. Medications Reconciled  Vitals  Weight: 158.5 lb Height: 66in Body Surface Area: 1.81 m Body Mass Index: 25.58 kg/m  Temp.: 97.60F  Pulse: 98 (Regular)  BP: 160/90 (Sitting, Left Arm, Standard)    Physical Exam  General Mental Status-Alert. General Appearance-Not in acute distress. Build & Nutrition-Well nourished. Posture-Normal posture. Gait-Normal.  Head and Neck Head-normocephalic, atraumatic with no lesions or palpable masses. Trachea-midline. Thyroid Gland Characteristics - normal size and consistency and no palpable nodules.  Chest and Lung Exam Chest and lung exam reveals -on auscultation, normal breath sounds, no adventitious sounds and normal vocal resonance.  Breast Note: Right breast reveals a 1.5 cm palpable mass at 9:30 position a few centimeters lateral to the areolar margin. Mobile and nontender. No palpable nodes in the right axilla. On the left side there is a large tumor centrally and superiorly. Nontender. This projects above the skin level but has not invaded the skin. The skin is somewhat effaced and shiny. At least 7 cm diameter. There may be a smaller mass higher in the upper outer quadrant. In the left axillary axilla there is bulky but mobile adenopathy, nontender. No gross arm edema on the left.   Cardiovascular Cardiovascular examination reveals -normal heart sounds, regular rate and rhythm with no murmurs and  femoral artery auscultation bilaterally reveals normal pulses, no bruits, no thrills. Note: Regular rhythm no tachycardia. Soft systolic murmur and aortic position. Palpable femoral pulses. 3+ ankle edema.   Abdomen Inspection Inspection of the abdomen reveals - No Hernias. Palpation/Percussion Palpation and Percussion of the abdomen reveal - Soft, Non Tender, No Rigidity (guarding), No hepatosplenomegaly and No Palpable abdominal masses.  Neurologic Neurologic evaluation reveals -alert and oriented x 3 with no impairment of recent or remote memory, normal attention span and ability to concentrate, normal sensation and normal coordination.  Neuropsychiatric Note: Alert. Carries on a good conversation. Cooperative. No agitation. Speech basically normal. Well-dressed and ambulance independently. Insight reasonable but not excellent. She knows. she has breast cancer and that she needs surgery and she knows who I am. She is competent to make her own decisions and is a surgical candidate.   Musculoskeletal Normal Exam - Bilateral-Upper Extremity Strength Normal and Lower Extremity Strength Normal.    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 Lindi Rubio and you have been started on a cancer pill called letrozole. He will probably take this for a long time You have had staging studies and there is no sign of cancer spread elsewhere in her body which is wonderful news.      Charlene Rubio has stated that you are at acceptable risk for surgery from a cardiac  standpoint     You have seen Charlene Rubio, the neurologist, you have been started on Aricept. She states that you are acceptable risk from a neurologic standpoint     We have once again discussed surgical options and planning. Together with your family we have decided to schedule you for left modified radical mastectomy, right  total mastectomy and right axillary sentinel lymph node biopsy We have discussed the indications, techniques, and risks of the surgery in detail We have discussed disability and recovery issues      You will definitely need help from home health physical therapy, occupational therapy and nursing     You will need to stop her Eliquis 3 days preop, and we will check with Dr. Claiborne Billings about that. Proceed with surgery  as soon as possible  BILATERAL BREAST CANCER (C50.911) MEMORY DISORDER (R41.3) HISTORY OF RECENT FALL (Z91.81) LOWER EXTREMITY EDEMA (R60.0) TYPE 2 DIABETES MELLITUS TREATED WITHOUT INSULIN (E11.9) AORTIC VALVULAR DISEASE (I35.9) ANTICOAGULATED (X52.17) DIASTOLIC CONGESTIVE HEART FAILURE DUE TO VALVULAR DISEASE (I38) HYPERTENSION, ESSENTIAL (I10)    Christin Mccreedy M. Dalbert Batman, M.D., [facility] General and Minimally invasive Surgery Breast and Colorectal Surgery Office:   [phone] Pager:   [phone]

## 2017-08-25 ENCOUNTER — Encounter (HOSPITAL_COMMUNITY): Payer: Self-pay

## 2017-08-25 ENCOUNTER — Other Ambulatory Visit: Payer: Self-pay

## 2017-08-25 ENCOUNTER — Encounter (HOSPITAL_COMMUNITY)
Admission: RE | Admit: 2017-08-25 | Discharge: 2017-08-25 | Disposition: A | Discharge status: Home / Self Care | Payer: Medicare Other | Source: Ambulatory Visit | Attending: General Surgery | Admitting: General Surgery

## 2017-08-25 DIAGNOSIS — Z01812 Encounter for preprocedural laboratory examination: Secondary | ICD-10-CM | POA: Insufficient documentation

## 2017-08-25 DIAGNOSIS — E119 Type 2 diabetes mellitus without complications: Secondary | ICD-10-CM | POA: Insufficient documentation

## 2017-08-25 HISTORY — DX: Malignant (primary) neoplasm, unspecified: C80.1

## 2017-08-25 LAB — COMPREHENSIVE METABOLIC PANEL
ALK PHOS: 71 U/L (ref 38–126)
ALT: 14 U/L (ref 14–54)
AST: 19 U/L (ref 15–41)
Albumin: 3.8 g/dL (ref 3.5–5.0)
Anion gap: 10 (ref 5–15)
BILIRUBIN TOTAL: 1 mg/dL (ref 0.3–1.2)
BUN: 14 mg/dL (ref 6–20)
CALCIUM: 9.3 mg/dL (ref 8.9–10.3)
CHLORIDE: 107 mmol/L (ref 101–111)
CO2: 24 mmol/L (ref 22–32)
CREATININE: 0.69 mg/dL (ref 0.44–1.00)
Glucose, Bld: 132 mg/dL — ABNORMAL HIGH (ref 65–99)
Potassium: 3.5 mmol/L (ref 3.5–5.1)
Sodium: 141 mmol/L (ref 135–145)
Total Protein: 6.7 g/dL (ref 6.5–8.1)

## 2017-08-25 LAB — URINALYSIS, COMPLETE (UACMP) WITH MICROSCOPIC
BILIRUBIN URINE: NEGATIVE
Bacteria, UA: NONE SEEN
Glucose, UA: NEGATIVE mg/dL
HGB URINE DIPSTICK: NEGATIVE
Ketones, ur: NEGATIVE mg/dL
Leukocytes, UA: NEGATIVE
NITRITE: NEGATIVE
PH: 5 (ref 5.0–8.0)
Protein, ur: NEGATIVE mg/dL
SPECIFIC GRAVITY, URINE: 1.015 (ref 1.005–1.030)

## 2017-08-25 LAB — CBC WITH DIFFERENTIAL/PLATELET
BASOS ABS: 0.1 10*3/uL (ref 0.0–0.1)
Basophils Relative: 1 %
Eosinophils Absolute: 0.4 10*3/uL (ref 0.0–0.7)
Eosinophils Relative: 6 %
HEMATOCRIT: 42.2 % (ref 36.0–46.0)
HEMOGLOBIN: 13.7 g/dL (ref 12.0–15.0)
Lymphocytes Relative: 18 %
Lymphs Abs: 1.1 10*3/uL (ref 0.7–4.0)
MCH: 31.2 pg (ref 26.0–34.0)
MCHC: 32.5 g/dL (ref 30.0–36.0)
MCV: 96.1 fL (ref 78.0–100.0)
MONOS PCT: 7 %
Monocytes Absolute: 0.5 10*3/uL (ref 0.1–1.0)
Neutro Abs: 4.3 10*3/uL (ref 1.7–7.7)
Neutrophils Relative %: 68 %
Platelets: 167 10*3/uL (ref 150–400)
RBC: 4.39 MIL/uL (ref 3.87–5.11)
RDW: 13.7 % (ref 11.5–15.5)
WBC: 6.2 10*3/uL (ref 4.0–10.5)

## 2017-08-25 LAB — GLUCOSE, CAPILLARY: Glucose-Capillary: 130 mg/dL — ABNORMAL HIGH (ref 65–99)

## 2017-08-25 NOTE — Progress Notes (Addendum)
Spoke with Jan , nurse at [facility].  She stated patient should stop aspirin 81 mg 2 days.  Patient's spouse stated she last took eliquis 08/23/17, and last dose aspirin 08/24/17.  Patient was able to verbalize surgery being done.  Patient's spouse also read over consent form and agreed to procedure.

## 2017-08-26 ENCOUNTER — Encounter (HOSPITAL_COMMUNITY): Payer: Self-pay | Admitting: Emergency Medicine

## 2017-08-26 ENCOUNTER — Telehealth: Payer: Self-pay | Admitting: Hematology and Oncology

## 2017-08-26 NOTE — Progress Notes (Signed)
Anesthesia Chart Review:  Pt is a 72 year old female scheduled for left breast modified radical mastectomy, right breast total mastectomy in 08/27/2016 with Fanny Skates, M.D.  - PCP is [name], DO - Neurologist is Ellouise Newer, MD - Cardiologist is [name], MD. Last office visit 06/11/17. 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 Never smoker. BMI 26  Medications include: Eliquis, ASA 81 mg, carvedilol, Aricept, Lasix, Imdur, lisinopril, metformin. Pt to stop ASA 2 days before surgery. Last dose eliquis 08/23/17  VS: BP 184/47. HR 60. RR 20. Temp 36.7  Preoperative labs reviewed.   - PT/PTT will be obtained day of surgery.  - glucose 132. HbA1c was 6.6 on 07/16/17.   EKG 06/11/17: NSR. LVH with repolarization abnormality  Carotid duplex 07/25/17:  - Right Carotid: There is evidence in the right ICA of a 1-39% stenosis. - Left Carotid: There is evidence in the left ICA of a 1-39% stenosis. - Vertebrals: Both vertebral arteries were patent with antegrade flow. - Subclavians: Normal flow hemodynamics were seen in the left subclavian artery.  Abnormal right subclavian artery waveforms.  CT chest 07/22/17:  1. Left breast mass with a notably pathologically enlarged left axillary lymph node. Other left axillary and subpectoral lymph nodes are slightly larger than similar right-sided lymph nodes but not definitely involved. No findings of metastatic disease to the abdomen/pelvis or visualized skeleton. 2. A right lower lobe pulmonary nodule measures 5 by 4 mm. This may well be benign but warrants surveillance. 3. Ascending aortic aneurysm 4.6 cm in diameter. Ascending thoracic aortic aneurysm. Recommend semi-annual imaging followup by CTA or MRA and referral to cardiothoracic surgery if not already obtained. 4. 5 mm calcification at the right UVJ  suspicious for nonobstructive calculus. 5. 3.0 cm left ovarian cystic lesion may have mild complexity. Pelvic sonography recommended for further characterization.  6. Other imaging findings of potential clinical significance: Aortic Atherosclerosis. Mild cardiomegaly. Tortuous descending thoracic aorta. Right adrenal adenoma. Thoracolumbar scoliosis. Degenerative hip arthropathy.  Nuclear stress test 05/28/17:   The left ventricular ejection fraction is moderately decreased (30-44%).  Nuclear stress EF: 41%.  There was no ST segment deviation noted during stress.  This is an intermediate risk study. 1. EF 41%, mild to moderate diffuse hypokinesis.  2. Fixed small, mild basal inferior perfusion defect. More likely attenuation than prior infarction.  - Intermediate risk study based on low EF.   Echo 04/26/17:  - Left ventricle: The cavity size was mildly dilated. There was mild concentric hypertrophy. Systolic function was severely reduced. The estimated ejection fraction was in the range of 25% to 30%. Wall motion was normal; there were no regional wall motion abnormalities. Doppler parameters are consistent with abnormal left ventricular relaxation (grade 1 diastolic dysfunction). - Aortic valve: There was moderate regurgitation. - Mitral valve: There was mild regurgitation. - Left atrium: The atrium was mildly dilated. - Tricuspid valve: There was mild regurgitation. - Pulmonary arteries: Systolic pressure was mildly increased. PA peak pressure: 36 mm Hg (S).  If no changes, I anticipate pt can proceed with surgery as scheduled.   Willeen Cass, FNP-BC [facility] Short Stay Surgical Center/Anesthesiology Phone: [phone] 08/26/2017 9:42 AM

## 2017-08-26 NOTE — Telephone Encounter (Signed)
Scheduled appt per 1/3 sch msg - left voicemail for patient regarding appts.

## 2017-08-27 ENCOUNTER — Encounter (HOSPITAL_COMMUNITY)
Admission: RE | Disposition: A | Discharge status: Home / Self Care | Payer: Self-pay | Source: Ambulatory Visit | Attending: General Surgery

## 2017-08-27 ENCOUNTER — Ambulatory Visit (HOSPITAL_COMMUNITY): Payer: Medicare Other | Admitting: Emergency Medicine

## 2017-08-27 ENCOUNTER — Encounter (HOSPITAL_COMMUNITY): Payer: Self-pay | Admitting: Urology

## 2017-08-27 ENCOUNTER — Ambulatory Visit (HOSPITAL_COMMUNITY)
Admission: RE | Admit: 2017-08-27 | Discharge: 2017-08-27 | Disposition: A | Discharge status: Home / Self Care | Payer: Medicare Other | Source: Ambulatory Visit | Attending: General Surgery | Admitting: General Surgery

## 2017-08-27 ENCOUNTER — Other Ambulatory Visit: Payer: Self-pay

## 2017-08-27 ENCOUNTER — Ambulatory Visit (HOSPITAL_COMMUNITY)
Admission: RE | Admit: 2017-08-27 | Discharge: 2017-08-29 | Disposition: A | Discharge status: Home / Self Care | Payer: Medicare Other | Source: Ambulatory Visit | Attending: General Surgery | Admitting: General Surgery

## 2017-08-27 DIAGNOSIS — C50811 Malignant neoplasm of overlapping sites of right female breast: Secondary | ICD-10-CM | POA: Diagnosis not present

## 2017-08-27 DIAGNOSIS — Z79899 Other long term (current) drug therapy: Secondary | ICD-10-CM | POA: Insufficient documentation

## 2017-08-27 DIAGNOSIS — I358 Other nonrheumatic aortic valve disorders: Secondary | ICD-10-CM | POA: Insufficient documentation

## 2017-08-27 DIAGNOSIS — C50212 Malignant neoplasm of upper-inner quadrant of left female breast: Secondary | ICD-10-CM | POA: Diagnosis not present

## 2017-08-27 DIAGNOSIS — C50411 Malignant neoplasm of upper-outer quadrant of right female breast: Secondary | ICD-10-CM | POA: Insufficient documentation

## 2017-08-27 DIAGNOSIS — C50911 Malignant neoplasm of unspecified site of right female breast: Secondary | ICD-10-CM | POA: Diagnosis present

## 2017-08-27 DIAGNOSIS — R413 Other amnesia: Secondary | ICD-10-CM | POA: Diagnosis not present

## 2017-08-27 DIAGNOSIS — C50812 Malignant neoplasm of overlapping sites of left female breast: Principal | ICD-10-CM

## 2017-08-27 DIAGNOSIS — I10 Essential (primary) hypertension: Secondary | ICD-10-CM | POA: Insufficient documentation

## 2017-08-27 DIAGNOSIS — C50112 Malignant neoplasm of central portion of left female breast: Secondary | ICD-10-CM | POA: Diagnosis not present

## 2017-08-27 DIAGNOSIS — Z9181 History of falling: Secondary | ICD-10-CM | POA: Diagnosis not present

## 2017-08-27 DIAGNOSIS — Z170 Estrogen receptor positive status [ER+]: Secondary | ICD-10-CM | POA: Diagnosis not present

## 2017-08-27 DIAGNOSIS — Z7901 Long term (current) use of anticoagulants: Secondary | ICD-10-CM | POA: Diagnosis not present

## 2017-08-27 DIAGNOSIS — C50912 Malignant neoplasm of unspecified site of left female breast: Secondary | ICD-10-CM

## 2017-08-27 DIAGNOSIS — R600 Localized edema: Secondary | ICD-10-CM | POA: Insufficient documentation

## 2017-08-27 DIAGNOSIS — C779 Secondary and unspecified malignant neoplasm of lymph node, unspecified: Secondary | ICD-10-CM | POA: Diagnosis not present

## 2017-08-27 DIAGNOSIS — E119 Type 2 diabetes mellitus without complications: Secondary | ICD-10-CM | POA: Insufficient documentation

## 2017-08-27 DIAGNOSIS — C773 Secondary and unspecified malignant neoplasm of axilla and upper limb lymph nodes: Secondary | ICD-10-CM | POA: Diagnosis not present

## 2017-08-27 DIAGNOSIS — Z7984 Long term (current) use of oral hypoglycemic drugs: Secondary | ICD-10-CM | POA: Diagnosis not present

## 2017-08-27 DIAGNOSIS — G8918 Other acute postprocedural pain: Secondary | ICD-10-CM | POA: Diagnosis not present

## 2017-08-27 DIAGNOSIS — I5030 Unspecified diastolic (congestive) heart failure: Secondary | ICD-10-CM | POA: Insufficient documentation

## 2017-08-27 HISTORY — PX: MASTECTOMY W/ SENTINEL NODE BIOPSY: SHX2001

## 2017-08-27 HISTORY — PX: MASTECTOMY: SHX3

## 2017-08-27 HISTORY — DX: Other cardiomyopathies: I42.8

## 2017-08-27 HISTORY — PX: MODIFIED MASTECTOMY: SHX5268

## 2017-08-27 LAB — GLUCOSE, CAPILLARY
GLUCOSE-CAPILLARY: 249 mg/dL — AB (ref 65–99)
GLUCOSE-CAPILLARY: 127 mg/dL — AB (ref 65–99)

## 2017-08-27 LAB — CBC
HCT: 40.2 % (ref 36.0–46.0)
HEMOGLOBIN: 13.2 g/dL (ref 12.0–15.0)
MCH: 31.3 pg (ref 26.0–34.0)
MCHC: 32.8 g/dL (ref 30.0–36.0)
MCV: 95.3 fL (ref 78.0–100.0)
Platelets: 170 10*3/uL (ref 150–400)
RBC: 4.22 MIL/uL (ref 3.87–5.11)
RDW: 13.7 % (ref 11.5–15.5)
WBC: 11.3 10*3/uL — ABNORMAL HIGH (ref 4.0–10.5)

## 2017-08-27 LAB — CREATININE, SERUM
CREATININE: 0.49 mg/dL (ref 0.44–1.00)
GFR calc Af Amer: >60 mL/min (ref >60)
GFR calc non Af Amer: >60 mL/min (ref >60)

## 2017-08-27 LAB — APTT: APTT: 23 s — AB (ref 24–36)

## 2017-08-27 LAB — PROTIME-INR
INR: 1.11
PROTHROMBIN TIME: 14.2 s (ref 11.4–15.2)

## 2017-08-27 SURGERY — MODIFIED MASTECTOMY
Anesthesia: Regional | Site: Breast | Laterality: Right

## 2017-08-27 MED ORDER — SUGAMMADEX SODIUM 200 MG/2ML IV SOLN
INTRAVENOUS | Status: DC | PRN
  Administered 2017-08-27: 100 mg via INTRAVENOUS

## 2017-08-27 MED ORDER — PHENYLEPHRINE HCL 10 MG/ML IJ SOLN
INTRAVENOUS | Status: DC | PRN
  Administered 2017-08-27: 30 ug/min via INTRAVENOUS

## 2017-08-27 MED ORDER — CHLORHEXIDINE GLUCONATE CLOTH 2 % EX PADS: 6.0000 | MEDICATED_PAD | Freq: Once | CUTANEOUS | Status: DC

## 2017-08-27 MED ORDER — SUGAMMADEX SODIUM 200 MG/2ML IV SOLN
INTRAVENOUS | Status: AC
  Filled 2017-08-27: qty 2

## 2017-08-27 MED ORDER — GABAPENTIN 300 MG PO CAPS
300.0000 mg | ORAL_CAPSULE | Freq: Two times a day (BID) | ORAL | Status: DC
  Administered 2017-08-27 – 2017-08-29 (×4): 300 mg via ORAL
  Filled 2017-08-27 (×4): qty 1

## 2017-08-27 MED ORDER — DONEPEZIL HCL 10 MG PO TABS
10.0000 mg | ORAL_TABLET | Freq: Every day | ORAL | Status: DC
  Administered 2017-08-27 – 2017-08-28 (×2): 10 mg via ORAL
  Filled 2017-08-27 (×2): qty 1

## 2017-08-27 MED ORDER — ACETAMINOPHEN 500 MG PO TABS
1000.0000 mg | ORAL_TABLET | ORAL | Status: AC
  Administered 2017-08-27: 1000 mg via ORAL

## 2017-08-27 MED ORDER — ROCURONIUM BROMIDE 100 MG/10ML IV SOLN
INTRAVENOUS | Status: DC | PRN
  Administered 2017-08-27: 20 mg via INTRAVENOUS
  Administered 2017-08-27: 70 mg via INTRAVENOUS

## 2017-08-27 MED ORDER — HYDRALAZINE HCL 20 MG/ML IJ SOLN
5.0000 mg | Freq: Once | INTRAMUSCULAR | Status: AC
  Administered 2017-08-27: 5 mg via INTRAVENOUS
  Filled 2017-08-27: qty 0.25

## 2017-08-27 MED ORDER — PANTOPRAZOLE SODIUM 40 MG IV SOLR
40.0000 mg | Freq: Every day | INTRAVENOUS | Status: DC
  Administered 2017-08-27: 40 mg via INTRAVENOUS
  Filled 2017-08-27: qty 40

## 2017-08-27 MED ORDER — MIDAZOLAM HCL 2 MG/2ML IJ SOLN
INTRAMUSCULAR | Status: AC
  Filled 2017-08-27: qty 2

## 2017-08-27 MED ORDER — CEFAZOLIN SODIUM-DEXTROSE 2-4 GM/100ML-% IV SOLN
INTRAVENOUS | Status: AC
  Filled 2017-08-27: qty 100

## 2017-08-27 MED ORDER — LACTATED RINGERS IV SOLN
INTRAVENOUS | Status: DC
  Administered 2017-08-27 (×2): via INTRAVENOUS

## 2017-08-27 MED ORDER — PHENYLEPHRINE 40 MCG/ML (10ML) SYRINGE FOR IV PUSH (FOR BLOOD PRESSURE SUPPORT)
PREFILLED_SYRINGE | INTRAVENOUS | Status: AC
  Filled 2017-08-27: qty 10

## 2017-08-27 MED ORDER — HYDRALAZINE HCL 20 MG/ML IJ SOLN
INTRAMUSCULAR | Status: AC
  Filled 2017-08-27: qty 1

## 2017-08-27 MED ORDER — ACETAMINOPHEN 500 MG PO TABS
ORAL_TABLET | ORAL | Status: AC
  Administered 2017-08-27: 1000 mg via ORAL
  Filled 2017-08-27: qty 2

## 2017-08-27 MED ORDER — PROPOFOL 10 MG/ML IV BOLUS
INTRAVENOUS | Status: DC | PRN
  Administered 2017-08-27: 150 mg via INTRAVENOUS
  Administered 2017-08-27: 30 mg via INTRAVENOUS

## 2017-08-27 MED ORDER — SENNA 8.6 MG PO TABS
1.0000 | ORAL_TABLET | Freq: Two times a day (BID) | ORAL | Status: DC
  Administered 2017-08-27 – 2017-08-29 (×4): 8.6 mg via ORAL
  Filled 2017-08-27 (×4): qty 1

## 2017-08-27 MED ORDER — LACTATED RINGERS IV SOLN
INTRAVENOUS | Status: DC
  Administered 2017-08-27: 16:00:00 via INTRAVENOUS

## 2017-08-27 MED ORDER — FUROSEMIDE 40 MG PO TABS
60.0000 mg | ORAL_TABLET | Freq: Every day | ORAL | Status: DC
  Administered 2017-08-27 – 2017-08-29 (×3): 60 mg via ORAL
  Filled 2017-08-27 (×3): qty 1

## 2017-08-27 MED ORDER — PHENYLEPHRINE HCL 10 MG/ML IJ SOLN
INTRAMUSCULAR | Status: AC
  Filled 2017-08-27: qty 1

## 2017-08-27 MED ORDER — DEXAMETHASONE SODIUM PHOSPHATE 10 MG/ML IJ SOLN
INTRAMUSCULAR | Status: DC | PRN
  Administered 2017-08-27: 4 mg via INTRAVENOUS

## 2017-08-27 MED ORDER — GABAPENTIN 300 MG PO CAPS
300.0000 mg | ORAL_CAPSULE | ORAL | Status: AC
  Administered 2017-08-27: 300 mg via ORAL

## 2017-08-27 MED ORDER — SODIUM CHLORIDE 0.9 % IJ SOLN
INTRAMUSCULAR | Status: AC
  Filled 2017-08-27: qty 10

## 2017-08-27 MED ORDER — FENTANYL CITRATE (PF) 100 MCG/2ML IJ SOLN: 25.0000 ug | INTRAMUSCULAR | Status: DC | PRN

## 2017-08-27 MED ORDER — CEFAZOLIN SODIUM-DEXTROSE 2-4 GM/100ML-% IV SOLN
2.0000 g | Freq: Three times a day (TID) | INTRAVENOUS | Status: AC
  Administered 2017-08-27: 2 g via INTRAVENOUS
  Filled 2017-08-27: qty 100

## 2017-08-27 MED ORDER — EPHEDRINE SULFATE 50 MG/ML IJ SOLN
INTRAMUSCULAR | Status: DC | PRN
  Administered 2017-08-27 (×2): 10 mg via INTRAVENOUS

## 2017-08-27 MED ORDER — FENTANYL CITRATE (PF) 250 MCG/5ML IJ SOLN
INTRAMUSCULAR | Status: AC
  Filled 2017-08-27: qty 5

## 2017-08-27 MED ORDER — METHOCARBAMOL 500 MG PO TABS
500.0000 mg | ORAL_TABLET | Freq: Four times a day (QID) | ORAL | Status: DC | PRN
  Administered 2017-08-27 – 2017-08-28 (×3): 500 mg via ORAL
  Filled 2017-08-27 (×3): qty 1

## 2017-08-27 MED ORDER — TECHNETIUM TC 99M SULFUR COLLOID FILTERED
1.0000 | Freq: Once | INTRAVENOUS | Status: AC | PRN
  Administered 2017-08-27: 1 via INTRADERMAL

## 2017-08-27 MED ORDER — ONDANSETRON 4 MG PO TBDP: 4.0000 mg | ORAL_TABLET | Freq: Four times a day (QID) | ORAL | Status: DC | PRN

## 2017-08-27 MED ORDER — HEPARIN SODIUM (PORCINE) 5000 UNIT/ML IJ SOLN
5000.0000 [IU] | Freq: Three times a day (TID) | INTRAMUSCULAR | Status: DC
  Administered 2017-08-28 – 2017-08-29 (×3): 5000 [IU] via SUBCUTANEOUS
  Filled 2017-08-27 (×3): qty 1

## 2017-08-27 MED ORDER — ONDANSETRON HCL 4 MG/2ML IJ SOLN: 4.0000 mg | Freq: Four times a day (QID) | INTRAMUSCULAR | Status: DC | PRN

## 2017-08-27 MED ORDER — GABAPENTIN 300 MG PO CAPS
ORAL_CAPSULE | ORAL | Status: AC
  Administered 2017-08-27: 300 mg via ORAL
  Filled 2017-08-27: qty 1

## 2017-08-27 MED ORDER — 0.9 % SODIUM CHLORIDE (POUR BTL) OPTIME
TOPICAL | Status: DC | PRN
  Administered 2017-08-27 (×3): 1000 mL

## 2017-08-27 MED ORDER — ONDANSETRON HCL 4 MG/2ML IJ SOLN
INTRAMUSCULAR | Status: DC | PRN
  Administered 2017-08-27: 4 mg via INTRAVENOUS

## 2017-08-27 MED ORDER — PHENYLEPHRINE HCL 10 MG/ML IJ SOLN
INTRAMUSCULAR | Status: DC | PRN
  Administered 2017-08-27: 40 ug via INTRAVENOUS
  Administered 2017-08-27: 80 ug via INTRAVENOUS

## 2017-08-27 MED ORDER — FENTANYL CITRATE (PF) 100 MCG/2ML IJ SOLN
100.0000 ug | Freq: Once | INTRAMUSCULAR | Status: AC
  Administered 2017-08-27: 100 ug via INTRAVENOUS

## 2017-08-27 MED ORDER — CARVEDILOL 6.25 MG PO TABS
6.2500 mg | ORAL_TABLET | Freq: Two times a day (BID) | ORAL | Status: DC
  Administered 2017-08-27 – 2017-08-29 (×4): 6.25 mg via ORAL
  Filled 2017-08-27 (×4): qty 1

## 2017-08-27 MED ORDER — LETROZOLE 2.5 MG PO TABS
2.5000 mg | ORAL_TABLET | Freq: Every day | ORAL | Status: DC
  Administered 2017-08-28 – 2017-08-29 (×2): 2.5 mg via ORAL
  Filled 2017-08-27 (×2): qty 1

## 2017-08-27 MED ORDER — ISOSORBIDE MONONITRATE ER 30 MG PO TB24
15.0000 mg | ORAL_TABLET | Freq: Every day | ORAL | Status: DC
  Administered 2017-08-28 – 2017-08-29 (×2): 15 mg via ORAL
  Filled 2017-08-27 (×2): qty 1

## 2017-08-27 MED ORDER — PROMETHAZINE HCL 25 MG/ML IJ SOLN: 6.2500 mg | INTRAMUSCULAR | Status: DC | PRN

## 2017-08-27 MED ORDER — PROPOFOL 10 MG/ML IV BOLUS
INTRAVENOUS | Status: AC
  Filled 2017-08-27: qty 20

## 2017-08-27 MED ORDER — FENTANYL CITRATE (PF) 100 MCG/2ML IJ SOLN
INTRAMUSCULAR | Status: DC | PRN
  Administered 2017-08-27 (×2): 50 ug via INTRAVENOUS

## 2017-08-27 MED ORDER — GLYCOPYRROLATE 0.2 MG/ML IJ SOLN
INTRAMUSCULAR | Status: DC | PRN
  Administered 2017-08-27 (×2): 0.2 mg via INTRAVENOUS

## 2017-08-27 MED ORDER — CEFAZOLIN SODIUM-DEXTROSE 2-4 GM/100ML-% IV SOLN
2.0000 g | INTRAVENOUS | Status: AC
  Administered 2017-08-27: 2 g via INTRAVENOUS

## 2017-08-27 MED ORDER — FENTANYL CITRATE (PF) 100 MCG/2ML IJ SOLN
INTRAMUSCULAR | Status: AC
  Administered 2017-08-27: 100 ug via INTRAVENOUS
  Filled 2017-08-27: qty 2

## 2017-08-27 MED ORDER — DIPHENHYDRAMINE HCL 50 MG/ML IJ SOLN
INTRAMUSCULAR | Status: DC | PRN
  Administered 2017-08-27: 12.5 mg via INTRAVENOUS

## 2017-08-27 MED ORDER — BUPIVACAINE-EPINEPHRINE (PF) 0.25% -1:200000 IJ SOLN
INTRAMUSCULAR | Status: DC | PRN
  Administered 2017-08-27 (×2): 75 mg

## 2017-08-27 MED ORDER — ONDANSETRON HCL 4 MG/2ML IJ SOLN
INTRAMUSCULAR | Status: AC
  Filled 2017-08-27: qty 2

## 2017-08-27 MED ORDER — DEXAMETHASONE SODIUM PHOSPHATE 10 MG/ML IJ SOLN
INTRAMUSCULAR | Status: AC
  Filled 2017-08-27: qty 1

## 2017-08-27 MED ORDER — METHYLENE BLUE 0.5 % INJ SOLN
INTRAVENOUS | Status: AC
  Filled 2017-08-27: qty 10

## 2017-08-27 MED ORDER — LISINOPRIL 10 MG PO TABS
10.0000 mg | ORAL_TABLET | Freq: Every day | ORAL | Status: DC
  Administered 2017-08-27 – 2017-08-29 (×3): 10 mg via ORAL
  Filled 2017-08-27 (×3): qty 1

## 2017-08-27 MED ORDER — SODIUM CHLORIDE 0.9 % IJ SOLN
INTRAVENOUS | Status: DC | PRN
  Administered 2017-08-27: 5 mL via INTRAMUSCULAR

## 2017-08-27 MED ORDER — HYDROCODONE-ACETAMINOPHEN 5-325 MG PO TABS
1.0000 | ORAL_TABLET | ORAL | Status: DC | PRN
  Administered 2017-08-27 – 2017-08-28 (×4): 1 via ORAL
  Filled 2017-08-27 (×4): qty 1

## 2017-08-27 SURGICAL SUPPLY — 57 items
APPLIER CLIP 11 MED OPEN (CLIP)
APPLIER CLIP 9.375 MED OPEN (MISCELLANEOUS) ×8
BINDER BREAST LRG (GAUZE/BANDAGES/DRESSINGS) IMPLANT
BINDER BREAST XLRG (GAUZE/BANDAGES/DRESSINGS) ×4 IMPLANT
CANISTER SUCT 3000ML PPV (MISCELLANEOUS) ×4 IMPLANT
CHLORAPREP W/TINT 26ML (MISCELLANEOUS) ×8 IMPLANT
CLIP APPLIE 11 MED OPEN (CLIP) IMPLANT
CLIP APPLIE 9.375 MED OPEN (MISCELLANEOUS) ×4 IMPLANT
CONT SPEC 4OZ CLIKSEAL STRL BL (MISCELLANEOUS) ×4 IMPLANT
COVER PROBE W GEL 5X96 (DRAPES) ×8 IMPLANT
COVER SURGICAL LIGHT HANDLE (MISCELLANEOUS) ×4 IMPLANT
DERMABOND ADVANCED (GAUZE/BANDAGES/DRESSINGS) ×2
DERMABOND ADVANCED .7 DNX12 (GAUZE/BANDAGES/DRESSINGS) ×2 IMPLANT
DEVICE DISSECT PLASMABLAD 3.0S (MISCELLANEOUS) IMPLANT
DRAIN CHANNEL 19F RND (DRAIN) ×16 IMPLANT
DRAPE CHEST BREAST 15X10 FENES (DRAPES) ×4 IMPLANT
DRAPE HALF SHEET 40X57 (DRAPES) ×8 IMPLANT
DRAPE ORTHO SPLIT 87X125 STRL (DRAPES) ×8 IMPLANT
ELECT BLADE 4.0 EZ CLEAN MEGAD (MISCELLANEOUS) ×4
ELECT CAUTERY BLADE 6.4 (BLADE) ×4 IMPLANT
ELECT REM PT RETURN 9FT ADLT (ELECTROSURGICAL) ×4
ELECTRODE BLDE 4.0 EZ CLN MEGD (MISCELLANEOUS) ×2 IMPLANT
ELECTRODE REM PT RTRN 9FT ADLT (ELECTROSURGICAL) ×2 IMPLANT
EVACUATOR SILICONE 100CC (DRAIN) ×16 IMPLANT
GAUZE SPONGE 4X4 12PLY STRL (GAUZE/BANDAGES/DRESSINGS) ×4 IMPLANT
GAUZE SPONGE 4X4 12PLY STRL LF (GAUZE/BANDAGES/DRESSINGS) ×4 IMPLANT
GLOVE BIOGEL PI IND STRL 7.5 (GLOVE) ×2 IMPLANT
GLOVE BIOGEL PI INDICATOR 7.5 (GLOVE) ×2
GLOVE EUDERMIC 7 POWDERFREE (GLOVE) ×4 IMPLANT
GOWN STRL REUS W/ TWL LRG LVL3 (GOWN DISPOSABLE) ×2 IMPLANT
GOWN STRL REUS W/ TWL XL LVL3 (GOWN DISPOSABLE) ×2 IMPLANT
GOWN STRL REUS W/TWL LRG LVL3 (GOWN DISPOSABLE) ×2
GOWN STRL REUS W/TWL XL LVL3 (GOWN DISPOSABLE) ×3
ILLUMINATOR WAVEGUIDE N/F (MISCELLANEOUS) IMPLANT
KIT BASIN OR (CUSTOM PROCEDURE TRAY) ×4 IMPLANT
KIT ROOM TURNOVER OR (KITS) ×4 IMPLANT
LIGHT WAVEGUIDE WIDE FLAT (MISCELLANEOUS) IMPLANT
NEEDLE 18GX1X1/2 (RX/OR ONLY) (NEEDLE) ×4 IMPLANT
NEEDLE FILTER BLUNT 18X 1/2SAF (NEEDLE) ×2
NEEDLE FILTER BLUNT 18X1 1/2 (NEEDLE) ×2 IMPLANT
NEEDLE HYPO 25GX1X1/2 BEV (NEEDLE) ×4 IMPLANT
NS IRRIG 1000ML POUR BTL (IV SOLUTION) ×4 IMPLANT
PACK GENERAL/GYN (CUSTOM PROCEDURE TRAY) ×4 IMPLANT
PAD ABD 8X10 STRL (GAUZE/BANDAGES/DRESSINGS) ×4 IMPLANT
PAD ARMBOARD 7.5X6 YLW CONV (MISCELLANEOUS) ×8 IMPLANT
PLASMABLADE 3.0S (MISCELLANEOUS)
SPECIMEN JAR X LARGE (MISCELLANEOUS) ×4 IMPLANT
SPONGE LAP 18X18 X RAY DECT (DISPOSABLE) ×12 IMPLANT
SUT ETHILON 3 0 FSL (SUTURE) ×24 IMPLANT
SUT MNCRL AB 4-0 PS2 18 (SUTURE) ×12 IMPLANT
SUT SILK 2 0 PERMA HAND 18 BK (SUTURE) ×4 IMPLANT
SUT VIC AB 3-0 SH 18 (SUTURE) ×12 IMPLANT
SYR CONTROL 10ML LL (SYRINGE) ×4 IMPLANT
TOWEL OR 17X24 6PK STRL BLUE (TOWEL DISPOSABLE) ×4 IMPLANT
TOWEL OR 17X26 10 PK STRL BLUE (TOWEL DISPOSABLE) ×4 IMPLANT
TUBE CONNECTING 12'X1/4 (SUCTIONS)
TUBE CONNECTING 12X1/4 (SUCTIONS) IMPLANT

## 2017-08-27 NOTE — Anesthesia Postprocedure Evaluation (Signed)
Anesthesia Post Note  Patient: Charlene Rubio  Procedure(s) Performed: LEFT BREAST MODIFIED RADICAL MASTECTOMY (Left Breast) RIGHT TOTAL MASTECTOMY WITH SENTINEL LYMPH NODE BIOPSY (Right Breast)     Patient location during evaluation: PACU Anesthesia Type: Regional and General Level of consciousness: sedated Pain management: pain level controlled Vital Signs Assessment: post-procedure vital signs reviewed and stable Respiratory status: spontaneous breathing and respiratory function stable Cardiovascular status: stable Postop Assessment: no apparent nausea or vomiting Anesthetic complications: no    Last Vitals:  Vitals:   08/27/17 1530 08/27/17 1551  BP:  (!) 162/60  Pulse: (!) 56 60  Resp: 15 16  Temp: 36.6 C 36.6 C  SpO2: 100% 96%    Last Pain:  Vitals:   08/27/17 1551  TempSrc: Oral                 Muhannad Bignell DANIEL

## 2017-08-27 NOTE — Anesthesia Procedure Notes (Signed)
Anesthesia Regional Block: Pectoralis block   Pre-Anesthetic Checklist: ,, timeout performed, Correct Patient, Correct Site, Correct Laterality, Correct Procedure, Correct Position, site marked, Risks and benefits discussed,  Surgical consent,  Pre-op evaluation,  At surgeon's request and post-op pain management  Laterality: Left  Prep: chloraprep       Needles:  Injection technique: Single-shot  Needle Type: Echogenic Stimulator Needle     Needle Length: 10cm  Needle Gauge: 21     Additional Needles:   Narrative:  Start time: 08/27/2017 10:01 AM End time: 08/27/2017 10:09 AM Injection made incrementally with aspirations every 5 mL.  Performed by: Personally

## 2017-08-27 NOTE — Transfer of Care (Signed)
Immediate Anesthesia Transfer of Care Note  Patient: Charlene Rubio  Procedure(s) Performed: LEFT BREAST MODIFIED RADICAL MASTECTOMY (Left Breast) RIGHT TOTAL MASTECTOMY WITH SENTINEL LYMPH NODE BIOPSY (Right Breast)  Patient Location: PACU  Anesthesia Type:General and Regional  Level of Consciousness: awake, alert , oriented and sedated  Airway & Oxygen Therapy: Patient Spontanous Breathing and Patient connected to nasal cannula oxygen  Post-op Assessment: Report given to RN, Post -op Vital signs reviewed and stable and Patient moving all extremities  Post vital signs: Reviewed and stable  Last Vitals:  Vitals:   08/27/17 1025 08/27/17 1027  BP: (!) 215/57 (!) 221/54  Pulse:    Resp: 16 16  Temp:    SpO2:      Last Pain:  Vitals:   08/27/17 0940  TempSrc: Oral         Complications: No apparent anesthesia complications

## 2017-08-27 NOTE — Progress Notes (Signed)
Dr. Tobias Alexander notified of patient's blood sugar and blood pressure.

## 2017-08-27 NOTE — Anesthesia Preprocedure Evaluation (Addendum)
Anesthesia Evaluation  Patient identified by MRN, date of birth, ID band Patient confused    Reviewed: Allergy & Precautions, NPO status , Patient's Chart, lab work & pertinent test results  History of Anesthesia Complications Negative for: history of anesthetic complications  Airway Mallampati: II  TM Distance: >3 FB Neck ROM: Full    Dental no notable dental hx. (+) Dental Advisory Given   Pulmonary neg pulmonary ROS,    Pulmonary exam normal        Cardiovascular hypertension, + Peripheral Vascular Disease and +CHF  Normal cardiovascular exam  1. EF 41%, mild to moderate diffuse hypokinesis.  2. Fixed small, mild basal inferior perfusion defect. More likely attenuation than prior infarction.   Intermediate risk study based on low EF.     Neuro/Psych PSYCHIATRIC DISORDERS Anxiety Dementia    GI/Hepatic negative GI ROS, Neg liver ROS,   Endo/Other  diabetes  Renal/GU negative Renal ROS     Musculoskeletal   Abdominal   Peds  Hematology   Anesthesia Other Findings   Reproductive/Obstetrics                            Anesthesia Physical Anesthesia Plan  ASA: III  Anesthesia Plan: General   Post-op Pain Management: GA combined w/ Regional for post-op pain   Induction: Intravenous  PONV Risk Score and Plan: 3 and Ondansetron, Dexamethasone and Diphenhydramine  Airway Management Planned: LMA  Additional Equipment:   Intra-op Plan:   Post-operative Plan: Extubation in OR  Informed Consent: I have reviewed the patients History and Physical, chart, labs and discussed the procedure including the risks, benefits and alternatives for the proposed anesthesia with the patient or authorized representative who has indicated his/her understanding and acceptance.   Dental advisory given  Plan Discussed with: Anesthesiologist  Anesthesia Plan Comments:         Anesthesia Quick  Evaluation

## 2017-08-27 NOTE — Interval H&P Note (Signed)
History and Physical Interval Note:  08/27/2017 10:15 AM  Charlene Rubio  has presented today for surgery, with the diagnosis of BILATERAL BREAST CANCER  The various methods of treatment have been discussed with the patient and family. After consideration of risks, benefits and other options for treatment, the patient has consented to  Procedure(s): LEFT BREAST MODIFIED RADICAL MASTECTOMY (Left) RIGHT TOTAL MASTECTOMY WITH SENTINEL LYMPH NODE BIOPSY (Right) as a surgical intervention .  The patient's history has been reviewed, patient examined, no change in status, stable for surgery.  I have reviewed the patient's chart and labs.  Questions were answered to the patient's satisfaction.     Adin Hector

## 2017-08-27 NOTE — Op Note (Addendum)
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 Charlene Rubio, M.D., FACS  Assistant:                      Charlene Rubio, RNFA  Operative Indications:   This is a 72 year old female who returns for a second visit to discuss surgical management of her bilateral breast cancer. Charlene Rubio is her PCP. Charlene Rubio is her cardiologist. Dr. Ellouise Rubio is her neurologist. She has  seen Dr. Lindi Rubio at the cancer center.       She has some memory problems and early dementia and has been started on Aricept. She noticed a lump in her left breast a couple of months ago. History is not that good. She was hospitalized earlier this year for congestive heart failure which has gotten much better imaging study showed a palpable 6.3 cm mass in the left breast, retroareolar area superiorly. Imaging studies also show abnormal left axillary lymph nodes. On the right side she had a 1.7 cm mass in the 9:30 position.      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 Image guided biopsy of left axillary lymph node shows metastatic breast cancer. Image guided biopsy of the right breast 9:30 position shows invasive ductal carcinoma, grade 1, ER and PR strongly positive. HER-2  negative.      Comorbidities include memory disorder, lives with her husband  and takes care of herself does not drive. Some balance problems. Hypertension. Recent fall with right periorbital ecchymoses. Diastolic CHF with aortic disease. Recent stress test May 28, 2017 with ejection fraction 41%. Denies chest pain or shortness of breath. Non-insulin-dependent diabetes mellitus. Anticoagulated with eliquis due to risk of recurrent left lower extremity DVT. Chronic lower extremity edema..     She has seen Dr. Lindi Rubio. Staging scans are negative. He started her on letrozole.       Dr. Nona Rubio and his office staff stated that she is acceptable risk and we can stop the Eliquis 2 or 3 days preop She has seen Dr. Delice Rubio at neurology and has been started on Aricept. No neurological contraindications to breast surgery     We talked a long time about her disease process. We talked about surgical options. On the left side she needs a modified radical mastectomy. On the right side she could have mastectomy with sentinel node or lumpectomy sentinel node and radiation therapy. After going through all of this it seemed simpler to do bilateral mastectomies and also would most likely achieve the best symmetry with postop mastectomy bra and prosthesis.    Operative Findings:       There was no palpable mass on the right side that was obvious although I felt a little bit of thickening at the 930 position.  I found 2 sentinel lymph nodes in the right axilla there was a large mass in the central superior left breast, at least 7 or 8 cm in diameter.  There are multiple palpably abnormal lymph nodes in the left axilla.  We removed all level 1 and level 2 lymph nodes on the left side.  The patient underwent bilateral pectoral block and  Procedure in Detail:          Injection of radionuclide into the right breast in the holding area.  She was taken to the operating room.  General endotracheal anesthesia  was induced.  Surgical timeout was performed.  Intravenous antibiotics were given.       Following alcohol prep I injected 5 cc of dilute methylene blue into the right breast, subareolar area and massaged the right breast for a few minutes.  We then prepped and draped the neck and entire chest wall and upper abdomen as well as bilateral axillary areas.      Using a ruler and a marking pen I planned bilateral transverse elliptical incisions to get the widest resection on the left and to reduce skin redundancy as much as possible.  I performed a right mastectomy first.  The transverse elliptical incision was made.  Skin flaps were raised superiorly to the infraclavicular area, medially to the parasternal area, inferiorly to the anterior rectus sheath and laterally to latissimus dorsi muscle.  The breast was then dissected off of the pectoralis major and minor muscles.  I dissected out the tail of Spence.  Using the neoprobe I found 2 sentinel lymph nodes and they were sent as separate specimens.  I marked the lateral skin margin on the right with a silk suture.  The entire mastectomy specimen was sent to the lab for routine histology.  Hemostasis was excellent and achievd with electrocautery and metal clips.  The wound was irrigated.  The wound was packed open temporarily.     I then made a mirror image transverse elliptical incision on the left side.  I carried the incision more superiorly on the left side to get a clean skin  margin.  Skin flaps were raised medially, superiorly, laterally and inferiorly in similar fashion.  The left breast with the bulky cancer was then dissected off of the pectoralis major and minor muscles.  We incised the clavipectoral fascia and took the dissection all the way up to the axillary vein.  Some superficial venous tributaries were controlled with either metal clips or 3-0 Vicryl ties.  The thoracodorsal neurovascular bundle was identified and preserved.  The long thoracic nerve was  preserved.  I dissected out all of the level 1 and level 2 lymph nodes, some of which were significantly abnormally enlarged.  The breast and axillary contents were sent en bloc as a single specimen.  Hemostasis was excellent and achieved electrocautery and metal clips.  The wound was irrigated. Two 62 French Blake drains were placed on each side,   one up into the axillary area and one across the skin flaps.  On each side these were brought out through separate stab incisions inferior laterally, sutured to the skin and connected to the suction bulbs.  Both incisions were closed in 2 layers.  Subcutaneous tissues were closed with interrupted sutures of 3-0 Vicryl and the skin incisions were closed with running subcuticular sutures of 4-0 Monocryl and Dermabond.  Sterile dry dressings and a breast binder were placed.  The patient tolerated the procedure well and was taken to PACU in stable condition.  EBL 250 cc.  Counts correct.  Complications none.      Charlene Rubio. Charlene Rubio, M.D., FACS General and Minimally Invasive Surgery Breast and Colorectal Surgery   Addendum: I logged on to the [place] website and reviewed her prescription medication history. 08/27/2017 1:57 PM

## 2017-08-27 NOTE — Anesthesia Procedure Notes (Signed)
Procedure Name: Intubation Date/Time: 08/27/2017 11:02 AM Performed by: Scheryl Darter, CRNA Pre-anesthesia Checklist: Patient identified, Emergency Drugs available, Suction available and Patient being monitored Patient Re-evaluated:Patient Re-evaluated prior to induction Oxygen Delivery Method: Circle System Utilized Preoxygenation: Pre-oxygenation with 100% oxygen Induction Type: IV induction Ventilation: Mask ventilation without difficulty Laryngoscope Size: Miller and 2 Grade View: Grade I Tube type: Oral Tube size: 7.0 mm Number of attempts: 1 Airway Equipment and Method: Stylet and Oral airway Placement Confirmation: ETT inserted through vocal cords under direct vision,  positive ETCO2 and breath sounds checked- equal and bilateral Secured at: 22 cm Tube secured with: Tape Dental Injury: Teeth and Oropharynx as per pre-operative assessment

## 2017-08-27 NOTE — Anesthesia Procedure Notes (Signed)
Anesthesia Regional Block: Pectoralis block   Pre-Anesthetic Checklist: ,, timeout performed, Correct Patient, Correct Site, Correct Laterality, Correct Procedure, Correct Position, site marked, Risks and benefits discussed,  Surgical consent,  Pre-op evaluation,  At surgeon's request and post-op pain management  Laterality: Right  Prep: chloraprep       Needles:  Injection technique: Single-shot  Needle Type: Echogenic Stimulator Needle     Needle Length: 10cm  Needle Gauge: 21     Additional Needles:   Narrative:  Start time: 08/27/2017 9:53 AM End time: 08/27/2017 10:01 AM Injection made incrementally with aspirations every 5 mL.  Performed by: Personally

## 2017-08-28 ENCOUNTER — Encounter (HOSPITAL_COMMUNITY): Payer: Self-pay | Admitting: General Surgery

## 2017-08-28 DIAGNOSIS — I5030 Unspecified diastolic (congestive) heart failure: Secondary | ICD-10-CM | POA: Diagnosis not present

## 2017-08-28 DIAGNOSIS — Z7901 Long term (current) use of anticoagulants: Secondary | ICD-10-CM | POA: Diagnosis not present

## 2017-08-28 DIAGNOSIS — C50411 Malignant neoplasm of upper-outer quadrant of right female breast: Secondary | ICD-10-CM | POA: Diagnosis not present

## 2017-08-28 DIAGNOSIS — C50212 Malignant neoplasm of upper-inner quadrant of left female breast: Secondary | ICD-10-CM | POA: Diagnosis not present

## 2017-08-28 DIAGNOSIS — I10 Essential (primary) hypertension: Secondary | ICD-10-CM | POA: Diagnosis not present

## 2017-08-28 DIAGNOSIS — I358 Other nonrheumatic aortic valve disorders: Secondary | ICD-10-CM | POA: Diagnosis not present

## 2017-08-28 DIAGNOSIS — E119 Type 2 diabetes mellitus without complications: Secondary | ICD-10-CM | POA: Diagnosis not present

## 2017-08-28 DIAGNOSIS — Z170 Estrogen receptor positive status [ER+]: Secondary | ICD-10-CM | POA: Diagnosis not present

## 2017-08-28 DIAGNOSIS — R413 Other amnesia: Secondary | ICD-10-CM | POA: Diagnosis not present

## 2017-08-28 DIAGNOSIS — C773 Secondary and unspecified malignant neoplasm of axilla and upper limb lymph nodes: Secondary | ICD-10-CM | POA: Diagnosis not present

## 2017-08-28 DIAGNOSIS — Z9181 History of falling: Secondary | ICD-10-CM | POA: Diagnosis not present

## 2017-08-28 DIAGNOSIS — R600 Localized edema: Secondary | ICD-10-CM | POA: Diagnosis not present

## 2017-08-28 DIAGNOSIS — Z79899 Other long term (current) drug therapy: Secondary | ICD-10-CM | POA: Diagnosis not present

## 2017-08-28 DIAGNOSIS — Z7984 Long term (current) use of oral hypoglycemic drugs: Secondary | ICD-10-CM | POA: Diagnosis not present

## 2017-08-28 LAB — CBC
HCT: 35.3 % — ABNORMAL LOW (ref 36.0–46.0)
HEMOGLOBIN: 11.4 g/dL — AB (ref 12.0–15.0)
MCH: 31.4 pg (ref 26.0–34.0)
MCHC: 32.3 g/dL (ref 30.0–36.0)
MCV: 97.2 fL (ref 78.0–100.0)
PLATELETS: 131 10*3/uL — AB (ref 150–400)
RBC: 3.63 MIL/uL — AB (ref 3.87–5.11)
RDW: 13.8 % (ref 11.5–15.5)
WBC: 7.5 10*3/uL (ref 4.0–10.5)

## 2017-08-28 LAB — BASIC METABOLIC PANEL
Anion gap: 8 (ref 5–15)
BUN: 8 mg/dL (ref 6–20)
CHLORIDE: 103 mmol/L (ref 101–111)
CO2: 28 mmol/L (ref 22–32)
CREATININE: 0.69 mg/dL (ref 0.44–1.00)
Calcium: 8.6 mg/dL — ABNORMAL LOW (ref 8.9–10.3)
GFR calc non Af Amer: >60 mL/min (ref >60)
Glucose, Bld: 107 mg/dL — ABNORMAL HIGH (ref 65–99)
Potassium: 2.8 mmol/L — ABNORMAL LOW (ref 3.5–5.1)
Sodium: 139 mmol/L (ref 135–145)

## 2017-08-28 MED ORDER — POTASSIUM CHLORIDE CRYS ER 20 MEQ PO TBCR
40.0000 meq | EXTENDED_RELEASE_TABLET | Freq: Two times a day (BID) | ORAL | Status: DC
  Administered 2017-08-28 – 2017-08-29 (×2): 40 meq via ORAL
  Filled 2017-08-28 (×2): qty 2

## 2017-08-28 MED ORDER — METFORMIN HCL ER 500 MG PO TB24
500.0000 mg | ORAL_TABLET | Freq: Every day | ORAL | Status: DC
  Administered 2017-08-28 – 2017-08-29 (×2): 500 mg via ORAL
  Filled 2017-08-28 (×2): qty 1

## 2017-08-28 MED ORDER — PANTOPRAZOLE SODIUM 40 MG PO TBEC
40.0000 mg | DELAYED_RELEASE_TABLET | Freq: Every day | ORAL | Status: DC
  Administered 2017-08-28 – 2017-08-29 (×2): 40 mg via ORAL
  Filled 2017-08-28 (×2): qty 1

## 2017-08-28 NOTE — Care Management Note (Addendum)
Case Management Note  Patient Details  Name: NALANY STEEDLEY MRN: [id number] Date of Birth: [birth year]  Subjective/Objective:                    Action/Plan:  Awaiting PT note.  OT recommending home health OT.  Patient's husband at bedside and does want to take his wife home at discharge, " she is moving good". Husband has been shown drain care , he would like home health RN. They have used [facility] in past and would like to do so again.   Ronalee Belts with [facility]  Aware and accept referral. Expected discharge tomorrow 08-29-17.  Patient already has walker at home. Husband does not feel that she needs 3 in 1  Expected Discharge Date:     08-29-17             Expected Discharge Plan:  [place]  In-House Referral:     Discharge planning Services  CM Consult  Post Acute Care Choice:  Durable Medical Equipment, Home Health Choice offered to:  Patient, Spouse  DME Arranged:  N/A DME Agency:  NA  HH Arranged:  PT, OT, RN [place] Agency:  [place]  Status of Service:  In process, will continue to follow  If discussed at Long Length of Stay Meetings, dates discussed:    Additional Comments:  Marilu Favre, RN 08/28/2017, 3:48 PM

## 2017-08-28 NOTE — Evaluation (Signed)
Occupational Therapy Evaluation Patient Details Name: Charlene Rubio MRN: [id number] DOB: August 04, 1946 Today's Date: 08/28/2017    History of Present Illness Pt is a 72 y.o. female s/p LEFT BREAST MODIFIED RADICAL MASTECTOMY and RIGHT TOTAL MASTECTOMY WITH SENTINEL LYMPH NODE BIOPSY. PMHx: Memory disorder, HTN, Recent fall, Diastolic CHF, DM, Breast cancer.   Clinical Impression   Per pt; she was independent with ADL and mobility PTA. Currently pt requires min HHA for functional mobility and min-mod assist for ADL with max cues throughout functional tasks for initiation and sequencing. Pt with hx of cognitive deficits; pts husband reports she is currently at her baseline. Pts husband feels he can care for her at home with her current functional status and is agreeable to [facility] therapies. Recommending HHOT for follow up to maximize independence and safety with ADL and functional mobility upon return home with 24/7 assist from family. Pt would benefit from continued skilled OT to address established goals.    Follow Up Recommendations  Home health OT;Supervision/Assistance - 24 hour    Equipment Recommendations  None recommended by OT    Recommendations for Other Services       Precautions / Restrictions Precautions Precautions: Fall Restrictions Weight Bearing Restrictions: No      Mobility Bed Mobility               General bed mobility comments: Pt OOB in chair upon arrival.  Transfers Overall transfer level: Needs assistance Equipment used: 1 person hand held assist Transfers: Sit to/from Stand Sit to Stand: Min assist         General transfer comment: for steadying balance and to boost up from chair x1, toilet x1. Max cues for initiation and sequencing.    Balance Overall balance assessment: Needs assistance;History of Falls Sitting-balance support: Feet supported;No upper extremity supported Sitting balance-Leahy Scale: Good     Standing balance support: No upper  extremity supported;During functional activity Standing balance-Leahy Scale: Poor                             ADL either performed or assessed with clinical judgement   ADL Overall ADL's : Needs assistance/impaired Eating/Feeding: Set up;Sitting   Grooming: Minimal assistance;Sitting   Upper Body Bathing: Minimal assistance;Sitting   Lower Body Bathing: Moderate assistance;Sit to/from stand   Upper Body Dressing : Minimal assistance;Sitting   Lower Body Dressing: Moderate assistance;Sit to/from stand   Toilet Transfer: Minimal assistance;Ambulation;Comfort height toilet;Cueing for sequencing(HHA)   Toileting- Clothing Manipulation and Hygiene: Minimal assistance;Sit to/from stand;Cueing for sequencing       Functional mobility during ADLs: Minimal assistance;Cueing for sequencing;Cueing for safety(HHA) General ADL Comments: Discussed pts current level of function and need for assist with pts husband; he reports she is at baseline cognitively and he feels he can handle her physically at home. Discussed HH therapies for follow up and he is agreeable,     Vision         Perception     Praxis      Pertinent Vitals/Pain Pain Assessment: Faces Faces Pain Scale: Hurts little more Pain Location: chest Pain Descriptors / Indicators: Sore Pain Intervention(s): Monitored during session     Hand Dominance Right   Extremity/Trunk Assessment Upper Extremity Assessment Upper Extremity Assessment: Overall WFL for tasks assessed   Lower Extremity Assessment Lower Extremity Assessment: Defer to PT evaluation       Communication Communication Communication: No difficulties   Cognition Arousal/Alertness: Awake/alert  Behavior During Therapy: WFL for tasks assessed/performed Overall Cognitive Status: History of cognitive impairments - at baseline                                     General Comments       Exercises     Shoulder Instructions       Home Living Family/patient expects to be discharged to:: Private residence Living Arrangements: Spouse/significant other Available Help at Discharge: Family;Available 24 hours/day Type of Home: House Home Access: Stairs to enter [employer] of Steps: ~5   Home Layout: One level     Bathroom Shower/Tub: Occupational psychologist: Standard     Home Equipment: Environmental consultant - 2 wheels;Shower seat;Bedside commode          Prior Functioning/Environment Level of Independence: Independent        Comments: Pt provided PLOF and home set up info--she is a poor historian, unsure of accuracy.        OT Problem List: Decreased strength;Decreased activity tolerance;Impaired balance (sitting and/or standing);Decreased cognition;Decreased safety awareness;Decreased knowledge of use of DME or AE;Decreased knowledge of precautions;Pain      OT Treatment/Interventions: Self-care/ADL training;DME and/or AE instruction;Therapeutic activities;Patient/family education;Balance training    OT Goals(Current goals can be found in the care plan section) Acute Rehab OT Goals Patient Stated Goal: go home tomorrow OT Goal Formulation: With patient/family Time For Goal Achievement: 09/11/17 Potential to Achieve Goals: Good ADL Goals Pt Will Perform Upper Body Bathing: with supervision;sitting Pt Will Perform Lower Body Bathing: with min assist;sit to/from stand Pt Will Transfer to Toilet: with supervision;ambulating;bedside commode Pt Will Perform Toileting - Clothing Manipulation and hygiene: with supervision;sit to/from stand Pt Will Perform Tub/Shower Transfer: Shower transfer;with supervision;ambulating;shower seat;rolling walker  OT Frequency: Min 2X/week   Barriers to D/C:            Co-evaluation              AM-PAC PT "6 Clicks" Daily Activity     Outcome Measure Help from another person eating meals?: None Help from another person taking care of personal  grooming?: A Little Help from another person toileting, which includes using toliet, bedpan, or urinal?: A Little Help from another person bathing (including washing, rinsing, drying)?: A Lot Help from another person to put on and taking off regular upper body clothing?: A Little Help from another person to put on and taking off regular lower body clothing?: A Lot 6 Click Score: 17   End of Session    Activity Tolerance: Patient tolerated treatment well Patient left: in chair;with call bell/phone within reach;with family/visitor present  OT Visit Diagnosis: Unsteadiness on feet (R26.81);Other abnormalities of gait and mobility (R26.89);History of falling (Z91.81);Pain Pain - part of body: (chest)                Time: 2952-8413 OT Time Calculation (min): 17 min Charges:  OT General Charges $OT Visit: 1 Visit OT Evaluation $OT Eval Moderate Complexity: 1 Mod G-Codes:     Leyanna Bittman A. Ulice Brilliant, M.S., OTR/L Pager: [place] 08/28/2017, 11:26 AM

## 2017-08-28 NOTE — Evaluation (Signed)
Physical Therapy Evaluation Patient Details Name: Charlene Rubio MRN: [id number] DOB: 25-Apr-1946 Today's Date: 08/28/2017   History of Present Illness  Pt is a 72 y.o. female s/p LEFT BREAST MODIFIED RADICAL MASTECTOMY and RIGHT TOTAL MASTECTOMY WITH SENTINEL LYMPH NODE BIOPSY. PMHx: Memory disorder, HTN, Recent fall, Diastolic CHF, DM, Breast cancer.  Clinical Impression  Pt was able to walk a good distance into the hallway with one person hand held assist.  We attempted to use a RW, but this did not go well as she did not respond to cues for safety and remained flexed over the walker.  She walks much better with just hand held assist.  She would benefit from HHPT f/u at discharge to help increase her strength and balance at home and decrease caregiver burden.   PT to follow acutely for deficits listed below.       Follow Up Recommendations Home health PT;Supervision for mobility/OOB    Equipment Recommendations  None recommended by PT    Recommendations for Other Services   NA    Precautions / Restrictions Precautions Precautions: Fall Required Braces or Orthoses: Other Brace/Splint Other Brace/Splint: multiple JP drains Restrictions Weight Bearing Restrictions: No      Mobility  Bed Mobility               General bed mobility comments: Pt OOB in chair upon arrival.  Transfers Overall transfer level: Needs assistance Equipment used: 1 person hand held assist Transfers: Sit to/from Stand Sit to Stand: Min assist         General transfer comment: Min assist to steady pt for balance and power up from sitting.    Ambulation/Gait Ambulation/Gait assistance: Min assist Ambulation Distance (Feet): 250 Feet Assistive device: 1 person hand held assist Gait Pattern/deviations: Step-through pattern Gait velocity: decreased   General Gait Details: Pt with a bit of a trendelenberg gait pattern, but this is baseline due to h/o multiple injuries to left leg. Attempted to use  RW, but she had difficulty using it safely and was forward flexed over it.  She did better with just her husband's hand held assist.          Balance Overall balance assessment: Needs assistance;History of Falls Sitting-balance support: Feet supported;No upper extremity supported Sitting balance-Leahy Scale: Good     Standing balance support: No upper extremity supported;During functional activity Standing balance-Leahy Scale: Poor                               Pertinent Vitals/Pain Pain Assessment: Faces Faces Pain Scale: Hurts little more Pain Location: chest Pain Descriptors / Indicators: Sore Pain Intervention(s): Limited activity within patient's tolerance;Monitored during session;Repositioned    Home Living Family/patient expects to be discharged to:: Private residence Living Arrangements: Spouse/significant other Available Help at Discharge: Family;Available 24 hours/day Type of Home: House Home Access: Stairs to enter Entrance Stairs-Rails: Right Entrance Stairs-Number of Steps: ~5 Home Layout: One level Home Equipment: Walker - 2 wheels;Shower seat;Bedside commode      Prior Function Level of Independence: Independent         Comments: Pt provided PLOF and home set up info--she is a poor historian, unsure of accuracy.     Hand Dominance   Dominant Hand: Right    Extremity/Trunk Assessment   Upper Extremity Assessment Upper Extremity Assessment: Defer to OT evaluation    Lower Extremity Assessment Lower Extremity Assessment: Generalized weakness(left leg with h/o fx,  makes her gait pattern different)    Cervical / Trunk Assessment Cervical / Trunk Assessment: Kyphotic  Communication   Communication: No difficulties  Cognition Arousal/Alertness: Awake/alert Behavior During Therapy: WFL for tasks assessed/performed Overall Cognitive Status: History of cognitive impairments - at baseline                                  General Comments: tangental, needs encouragement to walk and talk.          Exercises General Exercises - Lower Extremity Long Arc Quad: AROM;Both;10 reps Hip Flexion/Marching: AROM;Both;10 reps Toe Raises: AROM;Both;10 reps   Assessment/Plan    PT Assessment Patient needs continued PT services  PT Problem List Decreased strength;Decreased activity tolerance;Decreased balance;Decreased mobility;Decreased cognition;Decreased knowledge of use of DME       PT Treatment Interventions Gait training;Stair training;Functional mobility training;Therapeutic activities;Therapeutic exercise;Balance training;Patient/family education    PT Goals (Current goals can be found in the Care Plan section)  Acute Rehab PT Goals Patient Stated Goal: go home tomorrow PT Goal Formulation: With patient/family Time For Goal Achievement: 09/11/17 Potential to Achieve Goals: Good    Frequency Min 3X/week           AM-PAC PT "6 Clicks" Daily Activity  Outcome Measure Difficulty turning over in bed (including adjusting bedclothes, sheets and blankets)?: Unable Difficulty moving from lying on back to sitting on the side of the bed? : Unable Difficulty sitting down on and standing up from a chair with arms (e.g., wheelchair, bedside commode, etc,.)?: Unable Help needed moving to and from a bed to chair (including a wheelchair)?: A Little Help needed walking in hospital room?: A Little Help needed climbing 3-5 steps with a railing? : A Little 6 Click Score: 12    End of Session   Activity Tolerance: Patient tolerated treatment well Patient left: in chair;with call bell/phone within reach;with family/visitor present   PT Visit Diagnosis: Muscle weakness (generalized) (M62.81);Difficulty in walking, not elsewhere classified (R26.2)    Time: 7482-7078 PT Time Calculation (min) (ACUTE ONLY): 32 min   Charges:        Wells Guiles B. Etta Gassett, PT, DPT [phone]     PT Evaluation $PT Eval  Moderate Complexity: 1 Mod PT Treatments $Gait Training: 8-22 mins   08/28/2017, 6:09 PM

## 2017-08-28 NOTE — Progress Notes (Signed)
1 Day Post-Op  Subjective: Doing well.  Alert and stable.  Minimal pain.  Voiding without difficulty.  Getting out of bed. All 4 drains functioning with serosanguineous output Morning lab work pending. Hemoglobin 13.2 at 4 PM yesterday which is stable compared to preop  Objective: Vital signs in last 24 hours: Temp:  [97 F (36.1 C)-98.6 F (37 C)] 98.1 F (36.7 C) (01/10 0515) Pulse Rate:  [52-69] 60 (01/10 0515) Resp:  [13-35] 16 (01/10 0515) BP: (126-234)/(41-81) (P) 163/47 (01/10 0515) SpO2:  [96 %-100 %] 96 % (01/10 0515) Weight:  [73 kg (161 lb)-73.1 kg (161 lb 3.2 oz)] 73.1 kg (161 lb 3.2 oz) (01/09 1551) Last BM Date: 08/26/17  Intake/Output from previous day: 01/09 0701 - 01/10 0700 In: 2830.8 [P.O.:820; I.V.:1910.8; IV Piggyback:100] Out: 1010 [Urine:500; Drains:385; Blood:125] Intake/Output this shift: No intake/output data recorded.  General appearance: Alert pleasant cooperative.  Appropriate.  She understands that she had both breast removed yesterday.  She seems pleased that she has made it through the surgery okay Resp: clear to auscultation bilaterally Chest wall: no tenderness, Bilateral mastectomy wounds look good.  Skin flaps viable without ischemic change.  No hematoma.  All 4 drains functioning with serosanguineous output. Neurologic: Grossly normal.  Alert and cooperative.  No agitation.  Oriented to person place and situation.  Moves all 4 extremities to command.  Poor memory for details about her situation, however  Lab Results:  Results for orders placed or performed during the hospital encounter of 08/27/17 (from the past 24 hour(s))  APTT     Status: Abnormal   Collection Time: 08/27/17  9:15 AM  Result Value Ref Range   aPTT 23 (L) 24 - 36 seconds  Protime-INR     Status: None   Collection Time: 08/27/17  9:15 AM  Result Value Ref Range   Prothrombin Time 14.2 11.4 - 15.2 seconds   INR 1.11   Glucose, capillary     Status: Abnormal   Collection  Time: 08/27/17  9:30 AM  Result Value Ref Range   Glucose-Capillary 249 (H) 65 - 99 mg/dL  Glucose, capillary     Status: Abnormal   Collection Time: 08/27/17  2:08 PM  Result Value Ref Range   Glucose-Capillary 127 (H) 65 - 99 mg/dL  CBC     Status: Abnormal   Collection Time: 08/27/17  4:09 PM  Result Value Ref Range   WBC 11.3 (H) 4.0 - 10.5 K/uL   RBC 4.22 3.87 - 5.11 MIL/uL   Hemoglobin 13.2 12.0 - 15.0 g/dL   HCT 40.2 36.0 - 46.0 %   MCV 95.3 78.0 - 100.0 fL   MCH 31.3 26.0 - 34.0 pg   MCHC 32.8 30.0 - 36.0 g/dL   RDW 13.7 11.5 - 15.5 %   Platelets 170 150 - 400 K/uL  Creatinine, serum     Status: None   Collection Time: 08/27/17  4:09 PM  Result Value Ref Range   Creatinine, Ser 0.49 0.44 - 1.00 mg/dL   GFR calc non Af Amer >60 >60 mL/min   GFR calc Af Amer >60 >60 mL/min     Studies/Results: Nm Sentinel Node Inj-no Rpt (breast)  Result Date: 08/27/2017 Sulfur colloid was injected by the nuclear medicine technologist for melanoma sentinel node.    . carvedilol  6.25 mg Oral BID WC  . donepezil  10 mg Oral QHS  . furosemide  60 mg Oral Daily  . gabapentin  300 mg Oral BID  .  heparin  5,000 Units Subcutaneous Q8H  . isosorbide mononitrate  15 mg Oral Daily  . letrozole  2.5 mg Oral Daily  . lisinopril  10 mg Oral Daily  . pantoprazole (PROTONIX) IV  40 mg Intravenous QHS  . senna  1 tablet Oral BID     Assessment/Plan: s/p Procedure(s): LEFT BREAST MODIFIED RADICAL MASTECTOMY RIGHT TOTAL MASTECTOMY WITH SENTINEL LYMPH NODE BIOPSY   POD #1 -L.MRM; R.TN with SLN Stable.  No wound problems Mobilize out of bed PT and OT consults Family questions whether she needs to go to rehab.  We will see what the therapist say about that. From surgical standpoint she should be ready to be discharged from acute care hospital tomorrow [facility] Check labs.  Memory disorder-aricept History of recent fall Type 2 diabetes mellitus-follow CBGs.  Current modified diet.  Daily  metformin ER. 500 mg. Aortic valvular disease Diastolic CHF - lasix,coreg, imdur,lisinopril Hypertension Anticoagulated on Eliquis.  Currently on hold.  Restart tomorrow. Heparin SQ q8h for now.  @PROBHOSP @  LOS: 0 days    Adin Hector 08/28/2017  . .prob

## 2017-08-28 NOTE — Social Work (Signed)
CSW acknowledging family desire for HH/PT.   CSW signing off. Please consult if any additional needs arise.  Alexander Mt, [place] Work [phone]

## 2017-08-29 DIAGNOSIS — I10 Essential (primary) hypertension: Secondary | ICD-10-CM | POA: Diagnosis not present

## 2017-08-29 DIAGNOSIS — C50212 Malignant neoplasm of upper-inner quadrant of left female breast: Secondary | ICD-10-CM | POA: Diagnosis not present

## 2017-08-29 DIAGNOSIS — Z79899 Other long term (current) drug therapy: Secondary | ICD-10-CM | POA: Diagnosis not present

## 2017-08-29 DIAGNOSIS — C50411 Malignant neoplasm of upper-outer quadrant of right female breast: Secondary | ICD-10-CM | POA: Diagnosis not present

## 2017-08-29 DIAGNOSIS — Z9181 History of falling: Secondary | ICD-10-CM | POA: Diagnosis not present

## 2017-08-29 DIAGNOSIS — R600 Localized edema: Secondary | ICD-10-CM | POA: Diagnosis not present

## 2017-08-29 DIAGNOSIS — E119 Type 2 diabetes mellitus without complications: Secondary | ICD-10-CM | POA: Diagnosis not present

## 2017-08-29 DIAGNOSIS — Z170 Estrogen receptor positive status [ER+]: Secondary | ICD-10-CM | POA: Diagnosis not present

## 2017-08-29 DIAGNOSIS — Z7901 Long term (current) use of anticoagulants: Secondary | ICD-10-CM | POA: Diagnosis not present

## 2017-08-29 DIAGNOSIS — R413 Other amnesia: Secondary | ICD-10-CM | POA: Diagnosis not present

## 2017-08-29 DIAGNOSIS — Z7984 Long term (current) use of oral hypoglycemic drugs: Secondary | ICD-10-CM | POA: Diagnosis not present

## 2017-08-29 DIAGNOSIS — C773 Secondary and unspecified malignant neoplasm of axilla and upper limb lymph nodes: Secondary | ICD-10-CM | POA: Diagnosis not present

## 2017-08-29 DIAGNOSIS — I5030 Unspecified diastolic (congestive) heart failure: Secondary | ICD-10-CM | POA: Diagnosis not present

## 2017-08-29 DIAGNOSIS — I358 Other nonrheumatic aortic valve disorders: Secondary | ICD-10-CM | POA: Diagnosis not present

## 2017-08-29 MED ORDER — HYDROCODONE-ACETAMINOPHEN 5-325 MG PO TABS: 1.0000 | ORAL_TABLET | Freq: Four times a day (QID) | ORAL | 0 refills | Status: DC | PRN

## 2017-08-29 MED ORDER — POTASSIUM CHLORIDE ER 10 MEQ PO TBCR: 20.0000 meq | EXTENDED_RELEASE_TABLET | Freq: Two times a day (BID) | ORAL | 0 refills | Status: DC

## 2017-08-29 NOTE — Progress Notes (Signed)
Pt was up and ambulating with assist. Bil chest incision clean and dry, JP drains intact and charged. Pt's spouse have instructed on emptying and recording outputs. Discharge instructions given to the husband, verbalized understanding. Discharged to home.

## 2017-08-29 NOTE — Discharge Summary (Addendum)
Patient ID: Charlene Rubio [id number] 71 y.o. 1945/12/14  Admit date: 08/27/2017  Discharge date and time: 08/29/2017  Admitting Physician: Adin Hector  Discharge Physician: Adin Hector  Admission Diagnoses: BILATERAL BREAST CANCER  Discharge Diagnoses: Bilateral breast cancer                                         Memory disorder                                         Hypokalemia                                         History of recent fall                                         Lower extremity edema                                         Type 2 diabetes mellitus treated without insulin                                         Aortic valvular disease                                          Anticoagulated with Eliquis                                          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 Charlene Rubio is her PCP. Charlene Rubio is her cardiologist. Dr. Ellouise Rubio is her neurologist. She has  seen Dr. Lindi Rubio at the cancer center.  She has some memory problems and early dementia and has been started on Aricept. She noticed a lump in her left breast a couple of months ago. History is not that good. She was hospitalized earlier this year for congestive heart failure which has gotten much better imaging study showed a palpable 6.3 cm mass in the left breast, retroareolar area superiorly. Imaging studies also show abnormal left axillary lymph nodes. On the right side she had a 1.7 cm mass in the 9:30 position. 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 Image guided biopsy of left axillary lymph node shows metastatic breast cancer. Image guided biopsy of the right breast 9:30 position shows invasive ductal carcinoma, grade 1, ER and PR strongly positive. HER-2 negative. Comorbidities include memory disorder, lives with her husband and takes care of herself does not drive. Some balance problems. Hypertension. Recent fall with right periorbital ecchymoses. Diastolic CHF with aortic disease. Recent stress test May 28, 2017 with ejection fraction 41%. Denies chest pain or shortness of breath. Non-insulin-dependent diabetes mellitus. Anticoagulated with eliquis due to risk of recurrent left lower extremity DVT. Chronic lower extremity edema..   She has seen Dr. Lindi Rubio. Staging scans are negative. He started her on letrozole. Dr. Nona Rubio and his office staff stated that she is acceptable risk and we can stop the Eliquis 2 or 3 days preop She has seen Dr. Delice Rubio at neurology and has been started on Aricept. No neurological contraindications to breast surgery We talked a long time about her disease process. We talked about surgical options. On the left side she needs a modified radical mastectomy. On the right side she could have mastectomy with sentinel node or lumpectomy sentinel node and radiation therapy. After going through all of this it seemed simpler to do bilateral mastectomies and also would most likely achieve the best symmetry with postop mastectomy bra and prosthesis.     Hospital Course: On the day of admission the patient was taken to the operating room.  She underwent left modified radical mastectomy, right total mastectomy with sentinel lymph node biopsy.  The surgery was uneventful.  Final pathology is pending.     On postop day 1 the patient looked very good.  She was alert and comfortable.   Getting up to the bathroom and voiding without difficulty.  Tolerating diet.  Wounds look good.    Lab work showed hemoglobin 11.4.  Potassium has drifted down to 2.8 from 3.5 preop.  Presumably due to Lasix.  She was started on oral potassium.  BUN 8.  Creatinine 0.69.  Glucose 127.    She was seen in consultation by physical therapy, Occupational Therapy, Case management, and social work.  After all therapies had evaluated her, they felt that she was appropriate for discharge home with home health PT, home health OT, home health nursing.  Case management arranged all of these services.  The patient and her husband are in full agreement with this plan and would like to go home.     On postop day 2 she was ready to go home.  She had been ambulating and tolerating diet.  Had minimal pain.  Her wounds look very good.  Skin flaps were viable.  No sign of hematoma.  All 4 drains functioning, serosanguineous.  No active bleeding.     She was advised to resume all of her usual home medications.  This includes Eliquis.  She was given a prescription for Norco for pain but told to use that sparingly and transition to Tylenol as quickly as possible.  She was given a prescription for K-Dur, 20 mEq twice a day for 3 days.  She was strongly advised to see her primary care physician, Dr. Eulas Rubio, within 1 week to review all of her medications and medical conditions to insure  stablity.    The family was instructed in wound and drain care.  Recordkeeping.  She will  return to see me in the office in 10-14 days to check the drains.  Consults: PT, OT, case management, social work  Significant Diagnostic Studies: Surgical pathology, pending  Treatments: surgery: Left modified radical mastectomy, right total mastectomy with deep right axillary sentinel node biopsy  Disposition: Home  Patient Instructions:  Allergies as of 08/29/2017   No Known Allergies     Medication List    TAKE these medications   apixaban 5 MG  Tabs tablet Commonly known as:  ELIQUIS Take 1 tablet (5 mg total) by mouth 2 (two) times daily.   aspirin 81 MG EC tablet Take 1 tablet (81 mg total) by mouth daily.   carvedilol 6.25 MG tablet Commonly known as:  COREG Take 1 tablet (6.25 mg total) by mouth 2 (two) times daily with a meal.   CHEWABLE MULTIPLE VITAMINS PO Take 1 tablet by mouth 3 (three) times a week.   donepezil 10 MG tablet Commonly known as:  ARICEPT Take 1 tablet (10 mg total) by mouth at bedtime.   furosemide 40 MG tablet Commonly known as:  LASIX Take 1.5 tablets (60 mg total) by mouth daily.   HYDROcodone-acetaminophen 5-325 MG tablet Commonly known as:  NORCO Take 1 tablet by mouth every 6 (six) hours as needed for moderate pain or severe pain.   isosorbide mononitrate 30 MG 24 hr tablet Commonly known as:  IMDUR Take 0.5 tablets (15 mg total) by mouth daily.   letrozole 2.5 MG tablet Commonly known as:  FEMARA Take 1 tablet (2.5 mg total) by mouth daily.   lisinopril 10 MG tablet Commonly known as:  PRINIVIL,ZESTRIL Take 1 tablet (10 mg total) by mouth daily.   metFORMIN 500 MG 24 hr tablet Commonly known as:  GLUCOPHAGE-XR Take 1 tablet (500 mg total) by mouth 2 (two) times daily with a meal.   potassium chloride 10 MEQ tablet Commonly known as:  K-DUR Take 2 tablets (20 mEq total) by mouth 2 (two) times daily for 3 days.            Discharge Care Instructions  (From admission, onward)        Start     Ordered   08/29/17 0000  Discharge wound care:    Comments:  Keep a written record of the drainage. Measure the output from each drain separately and keep a chart Bring the chart to the office with you each time   You may sponge bathe or shower, starting Monday Change the bandage as needed if it becomes wet or soiled.  Dry gauze bandages and the elastic binder is all that is needed.   08/29/17 0617      Activity: Ambulate as much as possible.  No lifting more than 15  pounds.  No driving a car. Diet: diabetic diet Wound Care: as directed  Follow-up:  With Dr. Dalbert Batman in 10-14 days.   Signed: Edsel Petrin. Dalbert Batman, M.D., FACS General and minimally invasive surgery Breast and Colorectal Surgery   Addendum:   I logged on to the [facility] website and reviewed her prescription medication history     08/29/2017, 6:20 AM

## 2017-08-29 NOTE — Discharge Instructions (Signed)
[facility] °[phone] ° °MASTECTOMY: POST OP INSTRUCTIONS ° °Always review your discharge instruction sheet given to you by the facility where your surgery was performed. °IF YOU HAVE DISABILITY OR FAMILY LEAVE FORMS, YOU MUST BRING THEM TO THE OFFICE FOR PROCESSING.   °DO NOT GIVE THEM TO YOUR DOCTOR. °A prescription for pain medication may be given to you upon discharge.  Take your pain medication as prescribed, if needed.  If narcotic pain medicine is not needed, then you may take acetaminophen (Tylenol) or ibuprofen (Advil) as needed. °1. Take your usually prescribed medications unless otherwise directed. °2. If you need a refill on your pain medication, please contact your pharmacy.  They will contact our office to request authorization.  Prescriptions will not be filled after 5pm or on week-ends. °3. You should follow a light diet the first few days after arrival home, such as soup and crackers, etc.  Resume your normal diet the day after surgery. °4. Most patients will experience some swelling and bruising on the chest and underarm.  Ice packs will help.  Swelling and bruising can take several days to resolve.  °5. It is common to experience some constipation if taking pain medication after surgery.  Increasing fluid intake and taking a stool softener (such as Colace) will usually help or prevent this problem from occurring.  A mild laxative (Milk of Magnesia or Miralax) should be taken according to package instructions if there are no bowel movements after 48 hours. °6. Unless discharge instructions indicate otherwise, leave your bandage dry and in place until your next appointment in 3-5 days.  You may take a limited sponge bath.  No tube baths or showers until the drains are removed.  You may have steri-strips (small skin tapes) in place directly over the incision.  These strips should be left on the skin for 7-10 days.  If your surgeon used skin glue on the incision, you may  shower in 24 hours.  The glue will flake off over the next 2-3 weeks.  Any sutures or staples will be removed at the office during your follow-up visit. °7. DRAINS:  If you have drains in place, it is important to keep a list of the amount of drainage produced each day in your drains.  Before leaving the hospital, you should be instructed on drain care.  Call our office if you have any questions about your drains. °8. ACTIVITIES:  You may resume regular (light) daily activities beginning the next day--such as daily self-care, walking, climbing stairs--gradually increasing activities as tolerated.  You may have sexual intercourse when it is comfortable.  Refrain from any heavy lifting or straining until approved by your doctor. °a. You may drive when you are no longer taking prescription pain medication, you can comfortably wear a seatbelt, and you can safely maneuver your car and apply brakes. °b. RETURN TO WORK:  __________________________________________________________ °9. You should see your doctor in the office for a follow-up appointment approximately 3-5 days after your surgery.  Your doctor’s nurse will typically make your follow-up appointment when she calls you with your pathology report.  Expect your pathology report 2-3 business days after your surgery.  You may call to check if you do not hear from us after three days.   °10. OTHER INSTRUCTIONS: ______________________________________________________________________________________________ ____________________________________________________________________________________________ °WHEN TO CALL YOUR DOCTOR: °1. Fever over 101.0 °2. Nausea and/or vomiting °3. Extreme swelling or bruising °4. Continued bleeding from incision. °5. Increased pain, redness, or drainage from the incision. °  The clinic staff is available to answer your questions during regular business hours.  Please don’t hesitate to call and ask to speak to one of the nurses for clinical  concerns.  If you have a medical emergency, go to the nearest emergency room or call 911.  A surgeon from [facility] is always on call at the hospital. °[address], [address], [place], [place]  [address] ? [address] [address], [place], [place]   [address] °[phone] ? [phone] ? FAX [phone] °Web site: [email or web address] °

## 2017-09-01 ENCOUNTER — Telehealth: Payer: Self-pay

## 2017-09-01 NOTE — Telephone Encounter (Signed)
Transition Care Management Follow-Up Telephone Call   Date discharged and where: [facility] on 08/29/2017  How have you been since you were released from the hospital? Doing very well, not much pain.  Has been taking Tylenol Q6 but is going to taper down due to not needing it as much.   Any patient concerns? None  Items Reviewed:   Meds: Y  Allergies: Y  Dietary Changes Reviewed: Y  Functional Questionnaire:  Independent-I Dependent-D  ADLs:   Dressing- I w/ assist    Eating-I   Maintaining continence- D   Transferring- I w/ walker   Transportation- D   Meal Prep- D   Managing Meds- D  Confirmed importance and Date/Time of follow-up visits scheduled: Following up wih surgeon next week but he said to also follow up with PCP. 09/12/2017 @ 11:30am [name]   Confirmed with patient if condition worsens to call PCP or go to the Emergency Dept. Patient was given office number and encouraged to call back with questions or concerns: Yes

## 2017-09-03 NOTE — Assessment & Plan Note (Signed)
Right simple mastectomy: IDC grade 1, 1.1 cm, DCIS low-grade, margins negative, 0/2 lymph nodes negative; ER 95%, PR 95%, HER-2 negative Ki-67 15% T1 cN0 stage I a left simple mastectomy: IDC grade 3, 6.2 cm, 4/13 lymph nodes positive, margins negative, ER 5 %, PR 0%, HER-2 positive ratio 2.04, Ki-67 50 % T3 N2 stage IIIa  Pathology counseling: I discussed the final pathology report of the patient provided  a copy of this report. I discussed the margins as well as lymph node surgeries. We also discussed the final staging along with previously performed ER/PR and HER-2/neu testing.  Recommendation: 1.  Adjuvant chemotherapy with [place] followed by Herceptin [place]jeta maintenance 2. adjuvant radiation 2. adjuvant antiestrogen therapy

## 2017-09-04 ENCOUNTER — Ambulatory Visit (INDEPENDENT_AMBULATORY_CARE_PROVIDER_SITE_OTHER): Payer: Medicare Other | Admitting: Nurse Practitioner

## 2017-09-04 ENCOUNTER — Inpatient Hospital Stay: Payer: Medicare Other | Attending: Hematology and Oncology | Admitting: Hematology and Oncology

## 2017-09-04 ENCOUNTER — Telehealth: Payer: Self-pay | Admitting: Hematology and Oncology

## 2017-09-04 ENCOUNTER — Encounter: Payer: Self-pay | Admitting: Nurse Practitioner

## 2017-09-04 VITALS — BP 154/80 | HR 63 | Temp 97.5°F | Ht 66.0 in | Wt 156.8 lb

## 2017-09-04 DIAGNOSIS — Z170 Estrogen receptor positive status [ER+]: Secondary | ICD-10-CM

## 2017-09-04 DIAGNOSIS — I10 Essential (primary) hypertension: Secondary | ICD-10-CM

## 2017-09-04 DIAGNOSIS — C50812 Malignant neoplasm of overlapping sites of left female breast: Secondary | ICD-10-CM | POA: Diagnosis not present

## 2017-09-04 DIAGNOSIS — C50811 Malignant neoplasm of overlapping sites of right female breast: Secondary | ICD-10-CM

## 2017-09-04 MED ORDER — LISINOPRIL 20 MG PO TABS: 20.0000 mg | ORAL_TABLET | Freq: Every day | ORAL | 3 refills | Status: DC

## 2017-09-04 NOTE — Progress Notes (Signed)
Patient Care Team: [name], DO as PCP - General (Internal Medicine)  DIAGNOSIS:  Encounter Diagnosis  Name Primary?  . 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, estrogen receptor positive ([place])     SUMMARY OF ONCOLOGIC HISTORY:   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, estrogen receptor positive ([place])   06/23/2017 Initial Diagnosis    Left breast 11 o'clock position 6.3 x 4.2 x 6.1 cm mass with thick papillary projections, 2 adjacent left axillary lymph nodes; right breast 930 position 1.7 cm mass; left breast and left lymph node biopsy revealed IDC grade 3, ER 5%, PR 0%, Ki-67 50%, HER-2 positive ratio 2.04 T3N1 stage IIIa; right breast: Grade 1 IDC  ER 95%, PR 95%, Ki-67 15%, HER-2 negative; T1CN0 stage I a      08/27/2017 Surgery    Right simple mastectomy: IDC grade 1, 1.1 cm, DCIS low-grade, margins negative, 0/2 lymph nodes negative; ER 95%, PR 95%, HER-2 negative Ki-67 15% T1 cN0 stage I a left simple mastectomy: IDC grade 3, 6.2 cm, 4/13 lymph nodes positive, margins negative, ER 5 %, PR 0%, HER-2 positive ratio 2.04, Ki-67 50 % T3 N2 stage IIIa        CHIEF COMPLIANT: Follow-up after bilateral mastectomies  INTERVAL HISTORY: Charlene Rubio is a 72 year old with above-mentioned history of bilateral breast cancers underwent bilateral mastectomies and is here to discuss the results.  She is recovering slowly from the surgeries.  She continues to have drains.  The pain is under reasonable control.     REVIEW OF SYSTEMS:   Constitutional: Denies fevers, chills or abnormal weight loss Eyes: Denies blurriness of vision Ears, nose, mouth, throat, and face: Denies mucositis or sore throat Respiratory: Denies cough, dyspnea or wheezes Cardiovascular: Denies palpitation, chest discomfort Gastrointestinal:  Denies nausea, heartburn or change in bowel habits Skin: Denies abnormal skin rashes Lymphatics: Denies new  lymphadenopathy or easy bruising Neurological:Denies numbness, tingling or new weaknesses Behavioral/Psych: Mood is stable, no new changes  Extremities: No lower extremity edema Breast: Bilateral mastectomies All other systems were reviewed with the patient and are negative.  I have reviewed the past medical history, past surgical history, social history and family history with the patient and they are unchanged from previous note.  ALLERGIES:  has No Known Allergies.  MEDICATIONS:  Current Outpatient Medications  Medication Sig Dispense Refill  . apixaban (ELIQUIS) 5 MG TABS tablet Take 1 tablet (5 mg total) by mouth 2 (two) times daily. 60 tablet 5  . aspirin EC 81 MG EC tablet Take 1 tablet (81 mg total) by mouth daily.    . carvedilol (COREG) 6.25 MG tablet Take 1 tablet (6.25 mg total) by mouth 2 (two) times daily with a meal. 180 tablet 3  . donepezil (ARICEPT) 10 MG tablet Take 1 tablet (10 mg total) by mouth at bedtime. 30 tablet 6  . furosemide (LASIX) 40 MG tablet Take 1.5 tablets (60 mg total) by mouth daily. 145 tablet 3  . isosorbide mononitrate (IMDUR) 30 MG 24 hr tablet Take 0.5 tablets (15 mg total) by mouth daily. 45 tablet 3  . letrozole (FEMARA) 2.5 MG tablet Take 1 tablet (2.5 mg total) by mouth daily. 90 tablet 3  . lisinopril (PRINIVIL,ZESTRIL) 10 MG tablet Take 1 tablet (10 mg total) by mouth daily. 90 tablet 3  . metFORMIN (GLUCOPHAGE-XR) 500 MG 24 hr tablet Take 1 tablet (500 mg total) by mouth 2 (two) times daily  with a meal. 180 tablet 1  . Pediatric Multiple Vitamins (CHEWABLE MULTIPLE VITAMINS PO) Take 1 tablet by mouth 3 (three) times a week.     No current facility-administered medications for this visit.     PHYSICAL EXAMINATION: ECOG PERFORMANCE STATUS: 1 - Symptomatic but completely ambulatory  Vitals:   09/04/17 0957  BP: (!) 223/74  Pulse: 64  Resp: 16  Temp: 97.7 F (36.5 C)  SpO2: 100%   Filed Weights   09/04/17 0957  Weight: 155 lb 11.2  oz (70.6 kg)    GENERAL:alert, no distress and comfortable SKIN: skin color, texture, turgor are normal, no rashes or significant lesions EYES: normal, Conjunctiva are pink and non-injected, sclera clear OROPHARYNX:no exudate, no erythema and lips, buccal mucosa, and tongue normal  NECK: supple, thyroid normal size, non-tender, without nodularity LYMPH:  no palpable lymphadenopathy in the cervical, axillary or inguinal LUNGS: clear to auscultation and percussion with normal breathing effort HEART: regular rate & rhythm and no murmurs and no lower extremity edema ABDOMEN:abdomen soft, non-tender and normal bowel sounds MUSCULOSKELETAL:no cyanosis of digits and no clubbing  NEURO: alert & oriented x 3 with fluent speech, no focal motor/sensory deficits EXTREMITIES: No lower extremity edema  LABORATORY DATA:  I have reviewed the data as listed CMP Latest Ref Rng & Units 08/28/2017 08/27/2017 08/25/2017  Glucose 65 - 99 mg/dL 107(H) - 132(H)  BUN 6 - 20 mg/dL 8 - 14  Creatinine 0.44 - 1.00 mg/dL 0.69 0.49 0.69  Sodium 135 - 145 mmol/L 139 - 141  Potassium 3.5 - 5.1 mmol/L 2.8(L) - 3.5  Chloride 101 - 111 mmol/L 103 - 107  CO2 22 - 32 mmol/L 28 - 24  Calcium 8.9 - 10.3 mg/dL 8.6(L) - 9.3  Total Protein 6.5 - 8.1 g/dL - - 6.7  Total Bilirubin 0.3 - 1.2 mg/dL - - 1.0  Alkaline Phos 38 - 126 U/L - - 71  AST 15 - 41 U/L - - 19  ALT 14 - 54 U/L - - 14    Lab Results  Component Value Date   WBC 7.5 08/28/2017   HGB 11.4 (L) 08/28/2017   HCT 35.3 (L) 08/28/2017   MCV 97.2 08/28/2017   PLT 131 (L) 08/28/2017   NEUTROABS 4.3 08/25/2017    ASSESSMENT & PLAN:  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, estrogen receptor positive ([facility]) Right simple mastectomy: IDC grade 1, 1.1 cm, DCIS low-grade, margins negative, 0/2 lymph nodes negative; ER 95%, PR 95%, HER-2 negative Ki-67 15% T1 cN0 stage I a left simple mastectomy: IDC grade 3, 6.2 cm, 4/13 lymph nodes positive,  margins negative, ER 5 %, PR 0%, HER-2 positive ratio 2.04, Ki-67 50 % T3 N2 stage IIIa  Pathology counseling: I discussed the final pathology report of the patient provided  a copy of this report. I discussed the margins as well as lymph node surgeries. We also discussed the final staging along with previously performed ER/PR and HER-2/neu testing.  Recommendation: 1.  Ideally the patient would benefit from systemic chemotherapy.  We discussed with her about Taxol Herceptin Perjeta but given her previous cardiac ejection fraction of 25-30%, she would not be a candidate for anti-HER-2 therapy.  Since the echo was done in September I will request her cardiologist Dr. Claiborne Billings to repeat another echocardiogram and let us know if she may be eligible for anti-HER-2 treatment.  I suspect that she may not be a candidate based on her prior history.  If she cannot receive anti-HER-2 therapy, she could receive weekly Taxol x12 as an alternate regimen.  We will discuss this and make a final decision when she returns back to see me in 3 weeks.  Patient is very frail and is a poor candidate for systemic chemotherapy. 2. adjuvant radiation 3. adjuvant antiestrogen therapy  Severe hypertension: I instructed the patient's husband to call her primary care physician regarding her blood pressure medication.  I instructed them to increase the Coreg to 12.5 mg daily immediately until they hear from her primary care physician. I also sent a message to Dr. Claiborne Billings to see her regarding her eligibility for anti-HER-2 treatment.  I spent 25 minutes talking to the patient of which more than half was spent in counseling and coordination of care.  No orders of the defined types were placed in this encounter.  The patient has a good understanding of the overall plan. she agrees with it. she will call with any problems that may develop before the next visit here.   Harriette Ohara, MD 09/04/17

## 2017-09-04 NOTE — Telephone Encounter (Signed)
Gave patient AVs and calendar of upcoming February appointments.  °

## 2017-09-04 NOTE — Progress Notes (Signed)
Careteam: Patient Care Team: [name], DO as PCP - General (Internal Medicine)  Advanced Directive information    No Known Allergies  Chief Complaint  Patient presents with  . Acute Visit    Hypertension, elevated blood pressure x 1 week. Patient does not currently have a b/p machine, plans to get one today   . Medication Management    Patient was told to double Carvediolol this morning     HPI: Patient is a 72 y.o. female seen in the office today to follow up blood pressure. Recently with bilateral mastectomy last week. Husband reports her blood pressure was high in hospital and went to follow up appt at oncologist today. Blood pressure was noted to be 223/74. She was instructed to go home and take additional dose of coreg to = 12.5 mg. No headache, blurred vision, dizziness. No chest pains, no shortness of breath Husband reports she does not complain of anything.   Denies having much pain related to surgery- husband reports she would not tell if she did. Using tylenol PRN. Went home 2 days after surgery  Has white coat syndrome and BP normally higher in doctors office.  Follows heart healthy/low sodium diet.   Review of Systems:  Review of Systems  Constitutional: Negative for chills and fever.  Eyes: Negative for blurred vision.  Respiratory: Negative for cough and shortness of breath.   Cardiovascular: Positive for leg swelling (chronic L>R). Negative for chest pain and palpitations.  Neurological: Negative for dizziness, tingling, sensory change and headaches.  Psychiatric/Behavioral: Positive for memory loss.    Past Medical History:  Diagnosis Date  . Acute diastolic (congestive) heart failure ([place]) 04/25/2017  . Acute respiratory failure with hypoxia and hypercapnia ([place]) 04/25/2017  . ALLERGIC RHINITIS 07/01/2007   Qualifier: Diagnosis of  By: Sherren Mocha MD, Jory Ee   . Allergy   . Aortic insufficiency   . Aortic valve disorder 07/01/2007   Qualifier:  Diagnosis of  By: Sherren Mocha MD, Dellis Filbert A   . Bilateral leg edema 04/25/2017  . Borderline diabetic   . Cancer ([place])   . CHF (congestive heart failure) ([place]) 04/25/2017  . Dementia 04/30/2017  . Diabetes mellitus ([place]) 04/25/2017  . DOE (dyspnea on exertion) 04/25/2017  . Essential hypertension 07/01/2007   Qualifier: Diagnosis of  By: Sherren Mocha MD, Jory Ee Glaucoma suspect   . Hypertension   . KNEE PAIN, LEFT 04/06/2010   Qualifier: Diagnosis of  By: Sherren Mocha MD, Jory Ee   . Nonischemic cardiomyopathy ([place])   . Obesity   . Obesity, unspecified 07/01/2007   Qualifier: Diagnosis of  By: Sherren Mocha MD, Jory Ee   . Thyroid disease    Past Surgical History:  Procedure Laterality Date  . CESAREAN SECTION    . MASTECTOMY  08/27/2017   LEFT BREAST MODIFIED RADICAL MASTECTOMY (Left Breast)  . MASTECTOMY  08/27/2017   RIGHT TOTAL MASTECTOMY WITH SENTINEL LYMPH NODE BIOPSY (Right Breast)  . MASTECTOMY W/ SENTINEL NODE BIOPSY Right 08/27/2017   Procedure: RIGHT TOTAL MASTECTOMY WITH SENTINEL LYMPH NODE BIOPSY;  Surgeon: Fanny Skates, MD;  Location: [facility];  Service: General;  Laterality: Right;  . MODIFIED MASTECTOMY Left 08/27/2017   Procedure: LEFT BREAST MODIFIED RADICAL MASTECTOMY;  Surgeon: Fanny Skates, MD;  Location: [facility];  Service: General;  Laterality: Left;   Social History:   reports that  has never smoked. she has never used smokeless tobacco. She reports that she does not drink alcohol or use drugs.  Family History  Problem Relation Age of Onset  . Alzheimer's disease Mother 57  . Alzheimer's disease Father 61  . Congestive Heart Failure Father   . Alcoholism Brother   . Alzheimer's disease Brother   . Dementia Brother   . Alcoholism Brother   . Diabetes Brother     Medications: Patient's Medications  New Prescriptions   No medications on file  Previous Medications   APIXABAN (ELIQUIS) 5 MG TABS TABLET    Take 1 tablet (5 mg total) by mouth 2 (two) times daily.   ASPIRIN EC 81 MG  EC TABLET    Take 1 tablet (81 mg total) by mouth daily.   CARVEDILOL (COREG) 6.25 MG TABLET    Take 1 tablet (6.25 mg total) by mouth 2 (two) times daily with a meal.   DONEPEZIL (ARICEPT) 10 MG TABLET    Take 1 tablet (10 mg total) by mouth at bedtime.   FUROSEMIDE (LASIX) 40 MG TABLET    Take 1.5 tablets (60 mg total) by mouth daily.   ISOSORBIDE MONONITRATE (IMDUR) 30 MG 24 HR TABLET    Take 0.5 tablets (15 mg total) by mouth daily.   LETROZOLE (FEMARA) 2.5 MG TABLET    Take 1 tablet (2.5 mg total) by mouth daily.   LISINOPRIL (PRINIVIL,ZESTRIL) 10 MG TABLET    Take 1 tablet (10 mg total) by mouth daily.   METFORMIN (GLUCOPHAGE-XR) 500 MG 24 HR TABLET    Take 1 tablet (500 mg total) by mouth 2 (two) times daily with a meal.   PEDIATRIC MULTIPLE VITAMINS (CHEWABLE MULTIPLE VITAMINS PO)    Take 1 tablet by mouth 3 (three) times a week.  Modified Medications   No medications on file  Discontinued Medications   No medications on file     Physical Exam:  Vitals:   09/04/17 1553  BP: (!) 154/80  Pulse: 63  Temp: (!) 97.5 F (36.4 C)  TempSrc: Oral  SpO2: 98%  Weight: 156 lb 12.8 oz (71.1 kg)  Height: 5\' 6"  (1.676 m)   Body mass index is 25.31 kg/m.  Physical Exam  Constitutional: She appears well-developed.  Frail appearing in NAD  HENT:  Head: Normocephalic and atraumatic.  Cardiovascular: Normal rate and regular rhythm.  Murmur (1/6 SEM) heard. Pulmonary/Chest: Effort normal and breath sounds normal. No respiratory distress. She has no wheezes. She has no rales. She exhibits no tenderness.  Musculoskeletal: She exhibits edema (L>R).  Neurological: She is alert. Gait abnormal.  Skin: Skin is warm and dry. No rash noted. No erythema.  Psychiatric: She has a normal mood and affect. Her behavior is normal.    Labs reviewed: Basic Metabolic Panel: Recent Labs    04/26/17 0455  04/28/17 0218  04/30/17 0525  05/09/17 07/18/17 1147 08/25/17 0959 08/27/17 1609  08/28/17 0759  NA 144   < > 140   < > 138   < > 144 143 141  --  139  K 3.2*   < > 3.6   < > 3.9   < > 3.8 3.7 3.5  --  2.8*  CL 105   < > 98*   < > 96*  --   --   --  107  --  103  CO2 28   < > 32   < > 33*  --   --  25 24  --  28  GLUCOSE 145*   < > 68   < > 133*  --   --  120 132*  --  107*  BUN 25*   < > 22*   < > 21*   < > 15 13.3 14  --  8  CREATININE 1.11*   < > 0.88   < > 0.89   < > 0.4* 0.7 0.69 0.49 0.69  CALCIUM 8.4*   < > 8.0*   < > 8.2*  --   --  8.9 9.3  --  8.6*  MG  --   --  1.8  --  2.0  --   --   --   --   --   --   PHOS  --   --  3.5  --   --   --   --   --   --   --   --   TSH 2.447  --   --   --   --   --  4.03  --   --   --   --    < > = values in this interval not displayed.   Liver Function Tests: Recent Labs    04/27/17 0337 04/30/17 0525 05/09/17 08/25/17 0959  AST 30 29 16 19   ALT 34 28 17 14   ALKPHOS 46 45 72 71  BILITOT 1.3* 1.3*  --  1.0  PROT 4.7* 5.1*  --  6.7  ALBUMIN 2.8* 3.0*  --  3.8   No results for input(s): LIPASE, AMYLASE in the last 8760 hours. Recent Labs    04/30/17 0525  AMMONIA 36*   CBC: Recent Labs    08/25/17 0959 08/27/17 1609 08/28/17 0759  WBC 6.2 11.3* 7.5  NEUTROABS 4.3  --   --   HGB 13.7 13.2 11.4*  HCT 42.2 40.2 35.3*  MCV 96.1 95.3 97.2  PLT 167 170 131*   Lipid Panel: Recent Labs    05/09/17  CHOL 170  HDL 52  LDLCALC 95  TRIG 117   TSH: Recent Labs    04/26/17 0455 05/09/17  TSH 2.447 4.03   A1C: Lab Results  Component Value Date   HGBA1C 6.6 (H) 07/16/2017     Assessment/Plan 1. Essential hypertension Improved at this time.  Will have pt cont coreg 6.25 mg BID and to increase lisinopril to 20 mg daily.  May need ongoing adjustment  -dash diet given and encouraged. - lisinopril (PRINIVIL,ZESTRIL) 20 MG tablet; Take 1 tablet (20 mg total) by mouth daily.  Dispense: 90 tablet; Refill: 3  2. 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, estrogen receptor positive  ([place]) -doing well post op. Followed up with oncologist today and follow up with surgery next week.   Next appt: to follow up next week as scheduled, does not wish to follow up sooner because also has appt with surgery next week as well- will bring blood pressure in from this appt as well.  Carlos American. Harle Battiest  [facility] [phone] 8 am - 5 pm) [phone] (after hours)

## 2017-09-04 NOTE — Patient Instructions (Addendum)
Increase lisinopril to 20 mg by mouth daily Cont current dose of coreg/ carvedilol   6.25 by mouth twice daily    DASH Eating Plan DASH stands for "Dietary Approaches to Stop Hypertension." The DASH eating plan is a healthy eating plan that has been shown to reduce high blood pressure (hypertension). It may also reduce your risk for type 2 diabetes, heart disease, and stroke. The DASH eating plan may also help with weight loss. What are tips for following this plan? General guidelines  Avoid eating more than 2,300 mg (milligrams) of salt (sodium) a day. If you have hypertension, you may need to reduce your sodium intake to 1,500 mg a day.  Limit alcohol intake to no more than 1 drink a day for nonpregnant women and 2 drinks a day for men. One drink equals 12 oz of beer, 5 oz of wine, or 1 oz of hard liquor.  Work with your health care provider to maintain a healthy body weight or to lose weight. Ask what an ideal weight is for you.  Get at least 30 minutes of exercise that causes your heart to beat faster (aerobic exercise) most days of the week. Activities may include walking, swimming, or biking.  Work with your health care provider or diet and nutrition specialist (dietitian) to adjust your eating plan to your individual calorie needs. Reading food labels  Check food labels for the amount of sodium per serving. Choose foods with less than 5 percent of the Daily Value of sodium. Generally, foods with less than 300 mg of sodium per serving fit into this eating plan.  To find whole grains, look for the word "whole" as the first word in the ingredient list. Shopping  Buy products labeled as "low-sodium" or "no salt added."  Buy fresh foods. Avoid canned foods and premade or frozen meals. Cooking  Avoid adding salt when cooking. Use salt-free seasonings or herbs instead of table salt or sea salt. Check with your health care provider or pharmacist before using salt substitutes.  Do not  fry foods. Cook foods using healthy methods such as baking, boiling, grilling, and broiling instead.  Cook with heart-healthy oils, such as olive, canola, soybean, or sunflower oil. Meal planning   Eat a balanced diet that includes: ? 5 or more servings of fruits and vegetables each day. At each meal, try to fill half of your plate with fruits and vegetables. ? Up to 6-8 servings of whole grains each day. ? Less than 6 oz of lean meat, poultry, or fish each day. A 3-oz serving of meat is about the same size as a deck of cards. One egg equals 1 oz. ? 2 servings of low-fat dairy each day. ? A serving of nuts, seeds, or beans 5 times each week. ? Heart-healthy fats. Healthy fats called Omega-3 fatty acids are found in foods such as flaxseeds and coldwater fish, like sardines, salmon, and mackerel.  Limit how much you eat of the following: ? Canned or prepackaged foods. ? Food that is high in trans fat, such as fried foods. ? Food that is high in saturated fat, such as fatty meat. ? Sweets, desserts, sugary drinks, and other foods with added sugar. ? Full-fat dairy products.  Do not salt foods before eating.  Try to eat at least 2 vegetarian meals each week.  Eat more home-cooked food and less restaurant, buffet, and fast food.  When eating at a restaurant, ask that your food be prepared with less salt  or no salt, if possible. What foods are recommended? The items listed may not be a complete list. Talk with your dietitian about what dietary choices are best for you. Grains Whole-grain or whole-wheat bread. Whole-grain or whole-wheat pasta. Brown rice. [place]. Bulgur. Whole-grain and low-sodium cereals. Pita bread. Low-fat, low-sodium crackers. Whole-wheat flour tortillas. Vegetables Fresh or frozen vegetables (raw, steamed, roasted, or grilled). Low-sodium or reduced-sodium tomato and vegetable juice. Low-sodium or reduced-sodium tomato sauce and tomato paste. Low-sodium or  reduced-sodium canned vegetables. Fruits All fresh, dried, or frozen fruit. Canned fruit in natural juice (without added sugar). Meat and other protein foods Skinless chicken or [place]. Ground chicken or [place]. Pork with fat trimmed off. Fish and seafood. Egg whites. Dried beans, peas, or lentils. Unsalted nuts, nut butters, and seeds. Unsalted canned beans. Lean cuts of beef with fat trimmed off. Low-sodium, lean deli meat. Dairy Low-fat (1%) or fat-free (skim) milk. Fat-free, low-fat, or reduced-fat cheeses. Nonfat, low-sodium ricotta or cottage cheese. Low-fat or nonfat yogurt. Low-fat, low-sodium cheese. Fats and oils Soft margarine without trans fats. Vegetable oil. Low-fat, reduced-fat, or light mayonnaise and salad dressings (reduced-sodium). Canola, safflower, olive, soybean, and sunflower oils. Avocado. Seasoning and other foods Herbs. Spices. Seasoning mixes without salt. Unsalted popcorn and pretzels. Fat-free sweets. What foods are not recommended? The items listed may not be a complete list. Talk with your dietitian about what dietary choices are best for you. Grains Baked goods made with fat, such as croissants, muffins, or some breads. Dry pasta or rice meal packs. Vegetables Creamed or fried vegetables. Vegetables in a cheese sauce. Regular canned vegetables (not low-sodium or reduced-sodium). Regular canned tomato sauce and paste (not low-sodium or reduced-sodium). Regular tomato and vegetable juice (not low-sodium or reduced-sodium). [name]. Olives. Fruits Canned fruit in a light or heavy syrup. Fried fruit. Fruit in cream or butter sauce. Meat and other protein foods Fatty cuts of meat. Ribs. Fried meat. Berniece Salines. Sausage. Bologna and other processed lunch meats. Salami. Fatback. Hotdogs. Bratwurst. Salted nuts and seeds. Canned beans with added salt. Canned or smoked fish. Whole eggs or egg yolks. Chicken or [place] with skin. Dairy Whole or 2% milk, cream, and half-and-half.  Whole or full-fat cream cheese. Whole-fat or sweetened yogurt. Full-fat cheese. Nondairy creamers. Whipped toppings. Processed cheese and cheese spreads. Fats and oils Butter. Stick margarine. Lard. Shortening. Ghee. Bacon fat. Tropical oils, such as coconut, palm kernel, or palm oil. Seasoning and other foods Salted popcorn and pretzels. Onion salt, garlic salt, seasoned salt, table salt, and sea salt. Worcestershire sauce. Tartar sauce. Barbecue sauce. Teriyaki sauce. Soy sauce, including reduced-sodium. Steak sauce. Canned and packaged gravies. Fish sauce. Oyster sauce. Cocktail sauce. Horseradish that you find on the shelf. Ketchup. Mustard. Meat flavorings and tenderizers. Bouillon cubes. Hot sauce and Tabasco sauce. Premade or packaged marinades. Premade or packaged taco seasonings. Relishes. Regular salad dressings. Where to find more information:  National Heart, Lung, and [place]: https://wilson-eaton.com/  American Heart Association: www.heart.org Summary  The DASH eating plan is a healthy eating plan that has been shown to reduce high blood pressure (hypertension). It may also reduce your risk for type 2 diabetes, heart disease, and stroke.  With the DASH eating plan, you should limit salt (sodium) intake to 2,300 mg a day. If you have hypertension, you may need to reduce your sodium intake to 1,500 mg a day.  When on the DASH eating plan, aim to eat more fresh fruits and vegetables, whole grains, lean proteins, low-fat dairy,  and heart-healthy fats.  Work with your health care provider or diet and nutrition specialist (dietitian) to adjust your eating plan to your individual calorie needs. This information is not intended to replace advice given to you by your health care provider. Make sure you discuss any questions you have with your health care provider. Document Released: 07/25/2011 Document Revised: 07/29/2016 Document Reviewed: 07/29/2016 Elsevier Interactive Patient Education   [employer].

## 2017-09-10 ENCOUNTER — Telehealth: Payer: Self-pay | Admitting: Neurology

## 2017-09-10 NOTE — Telephone Encounter (Signed)
Returned call.  Spoke with pt's husband.  He states that pt recently underwent a double mastectomy and has started to treat her many ailments.  Husband states that pt is doing much better.  Her memory is "still not perfect, but she is better than ever."  Even stating that at times she is "[place] as a tac."  Husband states that as a family they are trying to figure out future plans in regards to pt's health maintenance.  Husband states that oncologist wants pt to start chemo therapy, however husband is wondering if she should 1) at her age and 2) with her issues with memory, would chemo even be beneficial or regress her recent progress.  Goes on to state that both of pt's parents died with dementia/alzheimer's and pt states that she "will never have that".  Pt husband asked for official diagnoses.  I advised that it appeared to be mild dementia.  Husband really just wanted to keep [place] updated with pt, but did say that if [name] knew anything it would be appreciated (meaning cause of dementia) states that return call is unnecessary as I gave Dx and thanked me for my time.

## 2017-09-10 NOTE — Telephone Encounter (Signed)
Patient's husband called and lmom saying that he needs to speak with you regarding Charlene Rubio. He wanted to ask you some questions regarding her Scan she had on 08/14/17 and that she just recently had a double mastectomy. Please Call. Thanks

## 2017-09-11 ENCOUNTER — Telehealth: Payer: Self-pay | Admitting: Hematology and Oncology

## 2017-09-11 NOTE — Telephone Encounter (Signed)
Patient spouse called to Reschedule appt .  Moved to 2/12 per pt request

## 2017-09-12 ENCOUNTER — Encounter: Payer: Self-pay | Admitting: Internal Medicine

## 2017-09-12 ENCOUNTER — Ambulatory Visit (INDEPENDENT_AMBULATORY_CARE_PROVIDER_SITE_OTHER): Payer: Medicare Other | Admitting: Internal Medicine

## 2017-09-12 VITALS — BP 130/60 | HR 57 | Temp 98.0°F | Wt 153.0 lb

## 2017-09-12 DIAGNOSIS — Z170 Estrogen receptor positive status [ER+]: Secondary | ICD-10-CM | POA: Diagnosis not present

## 2017-09-12 DIAGNOSIS — C50812 Malignant neoplasm of overlapping sites of left female breast: Secondary | ICD-10-CM | POA: Diagnosis not present

## 2017-09-12 DIAGNOSIS — I825Y2 Chronic embolism and thrombosis of unspecified deep veins of left proximal lower extremity: Secondary | ICD-10-CM

## 2017-09-12 DIAGNOSIS — E118 Type 2 diabetes mellitus with unspecified complications: Secondary | ICD-10-CM | POA: Diagnosis not present

## 2017-09-12 DIAGNOSIS — E876 Hypokalemia: Secondary | ICD-10-CM | POA: Diagnosis not present

## 2017-09-12 DIAGNOSIS — R413 Other amnesia: Secondary | ICD-10-CM | POA: Diagnosis not present

## 2017-09-12 DIAGNOSIS — I10 Essential (primary) hypertension: Secondary | ICD-10-CM

## 2017-09-12 DIAGNOSIS — C50811 Malignant neoplasm of overlapping sites of right female breast: Secondary | ICD-10-CM | POA: Diagnosis not present

## 2017-09-12 LAB — BASIC METABOLIC PANEL WITH GFR
BUN: 18 mg/dL (ref 7–25)
CALCIUM: 9.1 mg/dL (ref 8.6–10.4)
CO2: 29 mmol/L (ref 20–32)
CREATININE: 0.66 mg/dL (ref 0.60–0.93)
Chloride: 108 mmol/L (ref 98–110)
GFR, EST NON AFRICAN AMERICAN: 88 mL/min/{1.73_m2} (ref >=60)
GFR, Est African American: 102 mL/min/{1.73_m2} (ref >=60)
Glucose, Bld: 125 mg/dL (ref 65–139)
Potassium: 3.6 mmol/L (ref 3.5–5.3)
Sodium: 145 mmol/L (ref 135–146)

## 2017-09-12 MED ORDER — METFORMIN HCL ER 500 MG PO TB24: 500.0000 mg | ORAL_TABLET | Freq: Two times a day (BID) | ORAL | 1 refills | Status: DC

## 2017-09-12 NOTE — Patient Instructions (Signed)
Continue current medications as ordered  Follow up with specialists as scheduled  Will call with lab results  Follow up in 2 mos for DM and HTN

## 2017-09-12 NOTE — Progress Notes (Signed)
Patient ID: Charlene Rubio, female   DOB: 1946/05/06, 72 y.o.   MRN: [id number]   Location:  [facility] of Service:  OFFICE Provider: DR Arletha Grippe   Goals of Care:  Advanced Directives 09/12/2017  Does Patient Have a Medical Advance Directive? Yes  Type of Advance Directive Out of facility DNR (pink MOST or yellow form)  Does patient want to make changes to medical advance directive? No - Patient declined  Would patient like information on creating a medical advance directive? No - Patient declined  Pre-existing out of facility DNR order (yellow form or pink MOST form) Yellow form placed in chart (order not valid for inpatient use)     Chief Complaint  Patient presents with  . Transitions Of Care    08/27/2017 - 08/29/2017 (2 days)    HPI: Patient is a 72 y.o. female seen today for hospital follow-up s/p admission from [facility] for b/l breast cancer. She underwent left breast MRM and right total mastectomy with sentinel node bx on 08/27/17. No immediate postop complications. 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 - DUCTAL CARCINOMA IN SITU, LOW GRADE. - SURGICAL RESECTION MARGINS ARE NEGATIVE FOR CARCINOMA. - SEE ONCOLOGY TABLE BELOW. 2. Lymph node, sentinel, biopsy, Right axillary - THERE IS NO EVIDENCE OF CARCINOMA IN 1 OF 1 LYMPH NODE (0/1). 3. Lymph node, sentinel, biopsy, Right axillary - THERE IS NO EVIDENCE OF CARCINOMA IN 1 OF 1 LYMPH NODE (0/1). 4. Breast, simple mastectomy, Left - INVASIVE DUCTAL CARCINOMA, GRADE III/III, SPANNING 6.2 CM. - METASTATIC CARCINOMA IN 4 OF 13 LYMPH NODES (4/13)." Hgb 11.4; K 2.8 at d/c  Today she reports feeling better. No pain. No f/c. She still has x2 drains left. She is followed by surgery and h/O. She is taking femara. She will further discuss chemotx at next onc appt.  HTN - BP improved on higher dose of lisinopril. She alos takes coreg, imdur, lasix. She is on eliquis  and ASA  Dementia - stable on aricept. Followed by neurology  DM - controlled. A1c 6.6%  Hx LLE DVT - stable on eliquis  Past Medical History:  Diagnosis Date  . Acute diastolic (congestive) heart failure ([place]) 04/25/2017  . Acute respiratory failure with hypoxia and hypercapnia ([place]) 04/25/2017  . ALLERGIC RHINITIS 07/01/2007   Qualifier: Diagnosis of  By: Sherren Mocha MD, Jory Ee   . Allergy   . Aortic insufficiency   . Aortic valve disorder 07/01/2007   Qualifier: Diagnosis of  By: Sherren Mocha MD, Dellis Filbert A   . Bilateral leg edema 04/25/2017  . Borderline diabetic   . Cancer ([place])   . CHF (congestive heart failure) ([place]) 04/25/2017  . Dementia 04/30/2017  . Diabetes mellitus ([place]) 04/25/2017  . DOE (dyspnea on exertion) 04/25/2017  . Essential hypertension 07/01/2007   Qualifier: Diagnosis of  By: Sherren Mocha MD, Jory Ee Glaucoma suspect   . Hypertension   . KNEE PAIN, LEFT 04/06/2010   Qualifier: Diagnosis of  By: Sherren Mocha MD, Jory Ee   . Nonischemic cardiomyopathy ([place])   . Obesity   . Obesity, unspecified 07/01/2007   Qualifier: Diagnosis of  By: Sherren Mocha MD, Jory Ee   . Thyroid disease     Past Surgical History:  Procedure Laterality Date  . CESAREAN SECTION    . MASTECTOMY  08/27/2017   LEFT BREAST MODIFIED RADICAL MASTECTOMY (Left Breast)  . MASTECTOMY  08/27/2017   RIGHT TOTAL MASTECTOMY  WITH SENTINEL LYMPH NODE BIOPSY (Right Breast)  . MASTECTOMY W/ SENTINEL NODE BIOPSY Right 08/27/2017   Procedure: RIGHT TOTAL MASTECTOMY WITH SENTINEL LYMPH NODE BIOPSY;  Surgeon: Fanny Skates, MD;  Location: [facility];  Service: General;  Laterality: Right;  . MODIFIED MASTECTOMY Left 08/27/2017   Procedure: LEFT BREAST MODIFIED RADICAL MASTECTOMY;  Surgeon: Fanny Skates, MD;  Location: [facility];  Service: General;  Laterality: Left;    No Known Allergies  Outpatient Encounter Medications as of 09/12/2017  Medication Sig  . apixaban (ELIQUIS) 5 MG TABS tablet Take 1 tablet (5 mg total) by mouth 2 (two)  times daily.  Marland Kitchen aspirin EC 81 MG EC tablet Take 1 tablet (81 mg total) by mouth daily.  . carvedilol (COREG) 6.25 MG tablet Take 1 tablet (6.25 mg total) by mouth 2 (two) times daily with a meal.  . donepezil (ARICEPT) 10 MG tablet Take 1 tablet (10 mg total) by mouth at bedtime.  . furosemide (LASIX) 40 MG tablet Take 1.5 tablets (60 mg total) by mouth daily.  . isosorbide mononitrate (IMDUR) 30 MG 24 hr tablet Take 0.5 tablets (15 mg total) by mouth daily.  Marland Kitchen letrozole (FEMARA) 2.5 MG tablet Take 1 tablet (2.5 mg total) by mouth daily.  Marland Kitchen lisinopril (PRINIVIL,ZESTRIL) 20 MG tablet Take 1 tablet (20 mg total) by mouth daily.  . metFORMIN (GLUCOPHAGE-XR) 500 MG 24 hr tablet Take 1 tablet (500 mg total) by mouth 2 (two) times daily with a meal.  . Pediatric Multiple Vitamins (CHEWABLE MULTIPLE VITAMINS PO) Take 1 tablet by mouth 3 (three) times a week.  . [DISCONTINUED] metFORMIN (GLUCOPHAGE-XR) 500 MG 24 hr tablet Take 1 tablet (500 mg total) by mouth 2 (two) times daily with a meal.   No facility-administered encounter medications on file as of 09/12/2017.     Review of Systems:  Review of Systems  Unable to perform ROS: Dementia    Health Maintenance  Topic Date Due  . Hepatitis C Screening  05/20/1946  . FOOT EXAM  12/22/1954  . COLONOSCOPY  12/22/1994  . DEXA SCAN  12/21/2009  . HEMOGLOBIN A1C  01/13/2018  . PNA vac Low Risk Adult (2 of 2 - PCV13) 04/29/2018  . OPHTHALMOLOGY EXAM  07/04/2018  . MAMMOGRAM  06/14/2019  . TETANUS/TDAP  04/06/2020  . INFLUENZA VACCINE  Completed    Physical Exam: Vitals:   09/12/17 1146  BP: 130/60  Pulse: (!) 57  Temp: 98 F (36.7 C)  TempSrc: Oral  SpO2: 98%  Weight: 153 lb (69.4 kg)   Body mass index is 24.69 kg/m. Physical Exam  Constitutional: She appears well-developed and well-nourished.  HENT:  Mouth/Throat: Oropharynx is clear and moist. No oropharyngeal exudate.  MMM; no oral thrush  Eyes: Pupils are equal, round, and  reactive to light. No scleral icterus.  Neck: Neck supple. Carotid bruit is not present. No tracheal deviation present. No thyromegaly present.  Cardiovascular: Normal rate, regular rhythm and intact distal pulses. Exam reveals no friction rub. Gallop: 2/6 SEM.  Murmur heard. +1 pitting LLE and trace RLE edema; left calf swelling with palpable cord.   Pulmonary/Chest: Effort normal and breath sounds normal. No stridor. No respiratory distress. She has no wheezes. She has no rales.  Left ACW x2 JP drain with mod serosanguinous fluid. Incision healing well with no d/c, redness or dehiscence. Staples intact. Breast absent b/l  Abdominal: Soft. Normal appearance and bowel sounds are normal. She exhibits no distension and no mass. There is no  hepatomegaly. There is no tenderness. There is no rigidity, no rebound and no guarding. No hernia.  Musculoskeletal: She exhibits edema.  Lymphadenopathy:    She has no cervical adenopathy.  Neurological: She is alert.  Skin: Skin is warm and dry. No rash noted.  Psychiatric: She has a normal mood and affect. Her behavior is normal. Judgment and thought content normal.    Labs reviewed: Basic Metabolic Panel: Recent Labs    04/26/17 0455  04/28/17 0218  04/30/17 0525  05/09/17 07/18/17 1147 08/25/17 0959 08/27/17 1609 08/28/17 0759  NA 144   < > 140   < > 138   < > 144 143 141  --  139  K 3.2*   < > 3.6   < > 3.9   < > 3.8 3.7 3.5  --  2.8*  CL 105   < > 98*   < > 96*  --   --   --  107  --  103  CO2 28   < > 32   < > 33*  --   --  25 24  --  28  GLUCOSE 145*   < > 68   < > 133*  --   --  120 132*  --  107*  BUN 25*   < > 22*   < > 21*   < > 15 13.3 14  --  8  CREATININE 1.11*   < > 0.88   < > 0.89   < > 0.4* 0.7 0.69 0.49 0.69  CALCIUM 8.4*   < > 8.0*   < > 8.2*  --   --  8.9 9.3  --  8.6*  MG  --   --  1.8  --  2.0  --   --   --   --   --   --   PHOS  --   --  3.5  --   --   --   --   --   --   --   --   TSH 2.447  --   --   --   --   --  4.03   --   --   --   --    < > = values in this interval not displayed.   Liver Function Tests: Recent Labs    04/27/17 0337 04/30/17 0525 05/09/17 08/25/17 0959  AST _0 ALT 34 _1 ALKPHOS 46 45 72 71  BILITOT 1.3* 1.3*  --  1.0  PROT 4.7* 5.1*  --  6.7  ALBUMIN 2.8* 3.0*  --  3.8   No results for input(s): LIPASE, AMYLASE in the last 8760 hours. Recent Labs    04/30/17 0525  AMMONIA 36*   CBC: Recent Labs    08/25/17 0959 08/27/17 1609 08/28/17 0759  WBC 6.2 11.3* 7.5  NEUTROABS 4.3  --   --   HGB 13.7 13.2 11.4*  HCT 42.2 40.2 35.3*  MCV 96.1 95.3 97.2  PLT 167 170 131*   Lipid Panel: Recent Labs    05/09/17  CHOL 170  HDL 52  LDLCALC 95  TRIG 117   Lab Results  Component Value Date   HGBA1C 6.6 (H) 07/16/2017    Procedures since last visit: Nm Sentinel Node Inj-no Rpt (breast)  Result Date: 08/27/2017 Sulfur colloid was injected by the nuclear medicine technologist for melanoma sentinel node.    Assessment/Plan  ICD-10-CM   1. Hypokalemia E87.6 BMP with eGFR([facility])  2. Essential hypertension I10   3. Type 2 diabetes mellitus with complication, without long-term current use of insulin (HCC) E11.8   4. Chronic deep vein thrombosis (DVT) of proximal vein of left lower extremity (HCC) I82.5Y2   5. 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 Memory loss or impairment R41.3    Should she begin chemotx, her BS will need to be followed closely  Continue current medications as ordered  Follow up with specialists as scheduled  Will call with lab results  Follow up in 2 mos for DM and HTN  Charlene Rubio  [facility] [address] [place], [place] [address] [phone] Cell (Monday-Friday 8 AM - 5 PM) [phone] After 5 PM and follow prompts

## 2017-09-12 NOTE — Addendum Note (Signed)
Addended by: [name] on: 09/12/2017 02:37 PM   Modules accepted: Level of Service

## 2017-09-16 ENCOUNTER — Ambulatory Visit: Payer: Medicare Other | Admitting: Internal Medicine

## 2017-09-22 ENCOUNTER — Ambulatory Visit: Payer: Medicare Other | Admitting: Cardiovascular Disease

## 2017-09-22 ENCOUNTER — Encounter: Payer: Self-pay | Admitting: Cardiovascular Disease

## 2017-09-22 VITALS — BP 175/74 | HR 65 | Resp 16 | Ht 66.0 in | Wt 159.6 lb

## 2017-09-22 DIAGNOSIS — Z170 Estrogen receptor positive status [ER+]: Secondary | ICD-10-CM

## 2017-09-22 DIAGNOSIS — I825Z2 Chronic embolism and thrombosis of unspecified deep veins of left distal lower extremity: Secondary | ICD-10-CM

## 2017-09-22 DIAGNOSIS — I10 Essential (primary) hypertension: Secondary | ICD-10-CM

## 2017-09-22 DIAGNOSIS — Z7901 Long term (current) use of anticoagulants: Secondary | ICD-10-CM | POA: Diagnosis not present

## 2017-09-22 DIAGNOSIS — C50912 Malignant neoplasm of unspecified site of left female breast: Secondary | ICD-10-CM

## 2017-09-22 DIAGNOSIS — I5042 Chronic combined systolic (congestive) and diastolic (congestive) heart failure: Secondary | ICD-10-CM | POA: Diagnosis not present

## 2017-09-22 DIAGNOSIS — C50911 Malignant neoplasm of unspecified site of right female breast: Secondary | ICD-10-CM | POA: Diagnosis not present

## 2017-09-22 DIAGNOSIS — I428 Other cardiomyopathies: Secondary | ICD-10-CM

## 2017-09-22 NOTE — Progress Notes (Signed)
Cardiology Office Note    Date:  09/22/2017   ID:  ALVARETTA EISENBERGER, DOB Aug 11, 1946, MRN [id number]  PCP:  Charlene Cranker, DO  Cardiologist:  Charlene Majestic, MD   F/U cardiology evaluation  History of Present Illness:  Charlene Rubio is a 72 y.o. female who was referred through the courtesy of Dr. Gildardo Rubio in follow-up of her recent hospitalization.  I initially saw her on 05/07/2017 and last saw her on 06/11/2017 for preop evaluation.  She presents for follow-up evaluation following recent bilateral breast surgery She presents for follow-up evaluation.  Charlene Rubio had not seen a physician for over the past 5 years and has not been on any medications.  Over this time she was been having issues with memory and has suspected dementia.  She has a prior history of hypertension, but this has not been treated.  She was recently admitted to [facility] on 04/25/2017 after having at least a three-week history of progressive lower extremity edema and increasing shortness of breath.  She was felt initially to have acute hypoxic respiratory failure with pulmonary edema and CHF.  She required initial BiPAP support and aggressive diuretic therapy.  An echo Doppler study was done during her hospitalization which showed an EF of 25-30%.  There was grade 1 diastolic dysfunction.  There was moderate aortic insufficiency, mild mitral insufficiency, mild tricuspid insufficiency and there was mild left atrial dilatation.  There was mild pulmonary hypertension.  A venous Doppler study did suggest a chronic DVT in the left lower extremity.  She was started on eliquis per pharmacy and support stockings.  She was significantly hyperglycemic on admission with a glucose of 428 and a hemoglobin A1c elevated at 7.8.  She was also found to have a non-tender breast mass.  She was discharged on 05/01/2017 on eliquis 5 mg twice a day, carvedilol 3.125 mg twice a day, furosemide 40 mg daily, isosorbide 15 mg, lisinopril 2.5 mg, and  metformin 500 mg twice a day.    When I saw her for initial evaluation.  She had significant lower extremity edema and I recommended she change her furosemide to 40 mg twice a day for 3 days and then to 60 mg daily.  With her current myopathy.  I initiated lisinopril at 5 mg daily.  I recommended support stockings.  I scheduled her for [place] study to evaluate potential ischemic etiology to her LV dysfunction.  This was done on over 10 2018 and was intermediate risk due to an EF of 41%.  There was no significant perfusion abnormalities with only a small fixed mild basal inferior defect felt to be attenuation artifact.  There was no ischemia.  When I saw her in follow-up in October 2018, she was given preoperative clearance to undergo breast surgery.  She was advised to hold eliquis and was to be undergoing a neurologic evaluation for probable dementia.  She underwent bilateral breast surgery on August 27 2017 by Dr. Fanny Rubio and had a left modified radical mastectomy, a right total mastectomy with deep right axillary sentinel node biopsy and tolerated surgery well.  However, her blood pressure was elevated.  She will be seeing Dr. Quintella Reichert for oncologic follow-up and he has requested a follow-up echo Doppler study to reassess her LV function prior to consideration of anti-Her2 therapy. .  She denies any chest pain.  She denies palpitations.  She denies shortness of breath.  She tells me she was seen by her primary physician.  Postoperatively, and was significantly hypertensive and her lisinopril dose was doubled to 20 mg daily.  This has improved her blood pressure and she is now maintained on carvedilol 6.25 mg twice a day, furosemide 60 mg daily, isosorbide 15 mg, and lisinopril 20 mg.  She presents for reevaluation.  Past Medical History:  Diagnosis Date  . Acute diastolic (congestive) heart failure ([place]) 04/25/2017  . Acute respiratory failure with hypoxia and hypercapnia ([place]) 04/25/2017  .  ALLERGIC RHINITIS 07/01/2007   Qualifier: Diagnosis of  By: Sherren Mocha MD, Jory Ee   . Allergy   . Aortic insufficiency   . Aortic valve disorder 07/01/2007   Qualifier: Diagnosis of  By: Sherren Mocha MD, Dellis Filbert A   . Bilateral leg edema 04/25/2017  . Borderline diabetic   . Cancer ([place])   . CHF (congestive heart failure) ([place]) 04/25/2017  . Dementia 04/30/2017  . Diabetes mellitus ([place]) 04/25/2017  . DOE (dyspnea on exertion) 04/25/2017  . Essential hypertension 07/01/2007   Qualifier: Diagnosis of  By: Sherren Mocha MD, Jory Ee Glaucoma suspect   . Hypertension   . KNEE PAIN, LEFT 04/06/2010   Qualifier: Diagnosis of  By: Sherren Mocha MD, Jory Ee   . Nonischemic cardiomyopathy ([place])   . Obesity   . Obesity, unspecified 07/01/2007   Qualifier: Diagnosis of  By: Sherren Mocha MD, Jory Ee   . Thyroid disease     Past Surgical History:  Procedure Laterality Date  . CESAREAN SECTION    . MASTECTOMY  08/27/2017   LEFT BREAST MODIFIED RADICAL MASTECTOMY (Left Breast)  . MASTECTOMY  08/27/2017   RIGHT TOTAL MASTECTOMY WITH SENTINEL LYMPH NODE BIOPSY (Right Breast)  . MASTECTOMY W/ SENTINEL NODE BIOPSY Right 08/27/2017   Procedure: RIGHT TOTAL MASTECTOMY WITH SENTINEL LYMPH NODE BIOPSY;  Surgeon: Charlene Skates, MD;  Location: [facility];  Service: General;  Laterality: Right;  . MODIFIED MASTECTOMY Left 08/27/2017   Procedure: LEFT BREAST MODIFIED RADICAL MASTECTOMY;  Surgeon: Charlene Skates, MD;  Location: [facility];  Service: General;  Laterality: Left;    Current Medications: Outpatient Medications Prior to Visit  Medication Sig Dispense Refill  . apixaban (ELIQUIS) 5 MG TABS tablet Take 1 tablet (5 mg total) by mouth 2 (two) times daily. 60 tablet 5  . aspirin EC 81 MG EC tablet Take 1 tablet (81 mg total) by mouth daily.    . carvedilol (COREG) 6.25 MG tablet Take 1 tablet (6.25 mg total) by mouth 2 (two) times daily with a meal. 180 tablet 3  . donepezil (ARICEPT) 10 MG tablet Take 1 tablet (10 mg total) by mouth at  bedtime. 30 tablet 6  . furosemide (LASIX) 40 MG tablet Take 1.5 tablets (60 mg total) by mouth daily. 145 tablet 3  . isosorbide mononitrate (IMDUR) 30 MG 24 hr tablet Take 0.5 tablets (15 mg total) by mouth daily. 45 tablet 3  . letrozole (FEMARA) 2.5 MG tablet Take 1 tablet (2.5 mg total) by mouth daily. 90 tablet 3  . lisinopril (PRINIVIL,ZESTRIL) 20 MG tablet Take 1 tablet (20 mg total) by mouth daily. 90 tablet 3  . metFORMIN (GLUCOPHAGE-XR) 500 MG 24 hr tablet Take 1 tablet (500 mg total) by mouth 2 (two) times daily with a meal. 180 tablet 1  . Pediatric Multiple Vitamins (CHEWABLE MULTIPLE VITAMINS PO) Take 1 tablet by mouth 3 (three) times a week.     No facility-administered medications prior to visit.      Allergies:   Patient has no known  allergies.   Social History   Socioeconomic History  . Marital status: Married    Spouse name: None  . Number of children: None  . Years of education: None  . Highest education level: None  Social Needs  . Financial resource strain: None  . Food insecurity - worry: None  . Food insecurity - inability: None  . Transportation needs - medical: None  . Transportation needs - non-medical: None  Occupational History  . Occupation: retired Building control surveyor  Tobacco Use  . Smoking status: Never Smoker  . Smokeless tobacco: Never Used  Substance and Sexual Activity  . Alcohol use: No  . Drug use: No  . Sexual activity: Not Currently  Other Topics Concern  . None  Social History Narrative   Social History   Diet?    Do you drink/eat things with caffeine? Yes (minimal) coke   Marital status?          married                          What year were you married? 1968   Do you live in a house, apartment, assisted living, condo, trailer, etc.? house   Is it one or more stories? one   How many persons live in your home? 2   Do you have any pets in your home? (please list) none   Highest level of education completed?   Current or past  profession: preschool teacher/nanny   Do you exercise?         0                             Type & how often?   Advanced Directives   Do you have a living will?   Do you have a DNR form?                                  If not, do you want to discuss one?   Do you have signed POA/HPOA for forms?    Functional Status   Do you have difficulty bathing or dressing yourself?   Do you have difficulty preparing food or eating?    Do you have difficulty managing your medications?   Do you have difficulty managing your finances?   Do you have difficulty affording your medications?    Admitted to [facility] 05/01/17   Never smoked   Alcohol none   DNR       Additional social history is notable that she is married for 50 years.  She has 3 children, 6 grandchildren.  There is no history of tobacco or alcohol use.  She does not exercise.  Family History:  The patient's family history includes Alcoholism in her brother and brother; Alzheimer's disease in her brother; Alzheimer's disease (age of onset: 68) in her father and mother; Congestive Heart Failure in her father; Dementia in her brother; Diabetes in her brother.   ROS General: Negative; No fevers, chills, or night sweats;  HEENT: Negative; No changes in vision or hearing, sinus congestion, difficulty swallowing Pulmonary: Shortness of breath with activity Cardiovascular: See history of present illness Continued leg swelling GI: Negative; No nausea, vomiting, diarrhea, or abdominal pain GU: Negative; No dysuria, hematuria, or difficulty voiding Musculoskeletal: Negative; no myalgias, joint pain, or weakness Hematologic/Oncology: Chronic DVT;  breast mass Endocrine: Negative; no heat/cold intolerance; no diabetes Neuro: Suspected dementia Skin: Negative; No rashes or skin lesions Psychiatric: Negative; No behavioral problems, depression Sleep: Negative; No snoring, daytime sleepiness, hypersomnolence, bruxism, restless legs,  hypnogognic hallucinations, no cataplexy Other comprehensive 14 point system review is negative.   PHYSICAL EXAM:   VS:  BP (!) 175/74   Pulse 65   Resp 16   Ht _0  (1.676 m)   Wt 159 lb 9.6 oz (72.4 kg)   LMP  (LMP Unknown)   SpO2 98%   BMI 25.76 kg/m     Repeat blood pressure by me was significantly improved and was 128/74.  Wt Readings from Last 3 Encounters:  09/22/17 159 lb 9.6 oz (72.4 kg)  09/12/17 153 lb (69.4 kg)  09/04/17 156 lb 12.8 oz (71.1 kg)   General: Alert, oriented, no distress.  Skin: normal turgor, no rashes, warm and dry HEENT: Normocephalic, atraumatic. Pupils equal round and reactive to light; sclera anicteric; extraocular muscles intact;  Nose without nasal septal hypertrophy Mouth/Parynx benign; Mallinpatti scale 3 Neck: No JVD, no carotid bruits; normal carotid upstroke Lungs: clear to ausculatation and percussion; no wheezing or rales Chest wall: without tenderness to palpitation; status post bilateral mastectomy Heart: PMI not displaced, RRR, s1 s2 normal, 2/6 systolic murmur in the aortic area, no diastolic murmur, no rubs, gallops, thrills, or heaves Abdomen: soft, nontender; no hepatosplenomehaly, BS+; abdominal aorta nontender and not dilated by palpation. Back: no CVA tenderness Pulses 2+ Musculoskeletal: full range of motion, normal strength, no joint deformities Extremities: Edema is improved and she is wearing support stockings bilaterally no clubbing cyanosis , Homan's sign negative  Neurologic: grossly nonfocal; Cranial nerves grossly wnl Psychologic: Flat affect    Studies/Labs Reviewed:   EKG:  EKG is ordered today.  ECG (independently read by me): Normal sinus rhythm at 61 bpm.  Poor progression V1 through V3.  Normal intervals.  No ectopy.  06/11/2017 ECG (independently read by me): Normal sinus rhythm at 89 bpm.  LVH with repolarization changes.  QTc interval 455 ms.  05/13/2017 ECG (independently read by me): normal sinus  rhythm at 74 bpm.  Borderline LVH.  Nonspecific ST-T changes.  Baseline artifact.   Recent Labs: BMP Latest Ref Rng & Units 09/12/2017 08/28/2017 08/27/2017  Glucose 65 - 139 mg/dL 125 107(H) -  BUN 7 - 25 mg/dL 18 8 -  Creatinine 0.60 - 0.93 mg/dL 0.66 0.69 0.49  BUN/Creat Ratio 6 - 22 (calc) NOT APPLICABLE - -  Sodium 297 - 146 mmol/L 145 139 -  Potassium 3.5 - 5.3 mmol/L 3.6 2.8(L) -  Chloride 98 - 110 mmol/L 108 103 -  CO2 20 - 32 mmol/L 29 28 -  Calcium 8.6 - 10.4 mg/dL 9.1 8.6(L) -     Hepatic Function Latest Ref Rng & Units 08/25/2017 05/09/2017 04/30/2017  Total Protein 6.5 - 8.1 g/dL 6.7 - 5.1(L)  Albumin 3.5 - 5.0 g/dL 3.8 - 3.0(L)  AST 15 - 41 U/L _1 ALT 14 - 54 U/L _2 Alk Phosphatase 38 - 126 U/L 71 72 45  Total Bilirubin 0.3 - 1.2 mg/dL 1.0 - 1.3(H)  Bilirubin, Direct 0.1 - 0.5 mg/dL - - 0.3    CBC Latest Ref Rng & Units 08/28/2017 08/27/2017 08/25/2017  WBC 4.0 - 10.5 K/uL 7.5 11.3(H) 6.2  Hemoglobin 12.0 - 15.0 g/dL 11.4(L) 13.2 13.7  Hematocrit 36.0 - 46.0 % 35.3(L) 40.2 42.2  Platelets 150 -  400 K/uL 131(L) 170 167   Lab Results  Component Value Date   MCV 97.2 08/28/2017   MCV 95.3 08/27/2017   MCV 96.1 08/25/2017   Lab Results  Component Value Date   TSH 4.03 05/09/2017   Lab Results  Component Value Date   HGBA1C 6.6 (H) 07/16/2017     BNP    Component Value Date/Time   BNP 1,411.7 (H) 04/28/2017 0219    ProBNP No results found for: PROBNP   Lipid Panel     Component Value Date/Time   CHOL 170 05/09/2017   TRIG 117 05/09/2017   HDL 52 05/09/2017   CHOLHDL 4 08/19/2011 1038   VLDL 25.4 08/19/2011 1038   LDLCALC 95 05/09/2017     RADIOLOGY: Nm Sentinel Node Inj-no Rpt (breast)  Result Date: 08/27/2017 Sulfur colloid was injected by the nuclear medicine technologist for melanoma sentinel node.     Additional studies/ records that were reviewed today include:  I reviewed the patient's recent hospitalization  records.  ------------------------------------------------------------------- 04/26/2017 Echo Study Conclusions  - Left ventricle: The cavity size was mildly dilated. There was   mild concentric hypertrophy. Systolic function was severely   reduced. The estimated ejection fraction was in the range of 25%   to 30%. Wall motion was normal; there were no regional wall   motion abnormalities. Doppler parameters are consistent with   abnormal left ventricular relaxation (grade 1 diastolic   dysfunction). - Aortic valve: There was moderate regurgitation. - Mitral valve: There was mild regurgitation. - Left atrium: The atrium was mildly dilated. - Tricuspid valve: There was mild regurgitation. - Pulmonary arteries: Systolic pressure was mildly increased. PA   peak pressure: 36 mm Hg (S). 04/26/2017 echo  ------------------------------------------------------------------- 04/30/2017 lower extremity venous Doppler Summary:  - No evidence of deep vein thrombosis involving the visualized   veins of the right lower extremity. - Findings consistent with chronic deep vein thrombosis involving   the left distal femoral, popliteal, posterior tibial and peroneal   veins of the left lower extremity. - Incidental findings are consistent with: a very small Baker&'s   Cyst on the right.  ASSESSMENT:    1. Nonischemic cardiomyopathy ([place])   2. Essential hypertension   3. Chronic combined systolic and diastolic heart failure ([place])   4. 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 Anticoagulation adequate   6. Bilateral malignant neoplasm of breast in female, estrogen receptor positive, unspecified site of breast [facility])      PLAN:  Ms. Ishitha Roper is a 72 year old female who had developed extremity edema and increased shortness of breath leading to hospitalization in September 2018.  At that time, her ejection fraction was 25-30% on echo Doppler assessment  and she had grade 1 diastolic dysfunction, moderate AR, mild MR, mild TR, mild LA dilation with mildly pulmonary  hypertension.   She was felt to have acute combined systolic and diastolic heart failure.  She required initial BiPAP therapy was treated with diuretic therapy.  Because of chronic DVT she was started on anticoagulation with eliquis.  She is tolerating this without bleeding.  A nuclear study was nonischemic.  Ejection fraction was 41%.  She underwent successful bilateral breast surgery by Dr. Dalbert [facility], but apparently at the time she was significantly hypertensive.  Her medications have subsequently been adjusted and she is now on increased ACE inhibition.  Although her blood pressure was elevated immediately after walking into the office today when  taken by the nurse, her blood pressure significantly improved when rechecked by me on her regimen of lisinopril 20 mg, carvedilol 6.25 twice a day, and furosemide 60 mg.  She also is on low-dose nitrate therapy.  Her peripheral edema has significantly improved and she continues to wear support stockings.  Per request of Dr. Langston Masker, I am rechecking an echo Doppler study to reassess her LV function.  Hopefully this has improved.  Dr. Langston Masker would like to see her ejection fraction over 50% in order to give her Herceptin.  She is scheduled to see him next week.  I will try to obtain the echo Doppler study.  This week, so that he will have the results.  Prior to his evaluation.  She's not having any episodes of chest pain or shortness of breath.  She denies any residual leg discomfort from her previous DVT.  She has been undergoing neurologic evaluation and now sees [name].  I will see her in 6 months for cardiology reevaluation  Medication Adjustments/Labs and Tests Ordered: Current medicines are reviewed at length with the patient today.  Concerns regarding medicines are outlined above.  Medication changes, Labs and Tests ordered today are listed in  the Patient Instructions below. Patient Instructions  Medication Instructions:  Your physician recommends that you continue on your current medications as directed. Please refer to the Current Medication list given to you today.  Testing/Procedures: Your physician has requested that you have an echocardiogram (Before Feb, 12th). Echocardiography is a painless test that uses sound waves to create images of your heart. It provides your doctor with information about the size and shape of your heart and how well your heart's chambers and valves are working. This procedure takes approximately one hour. There are no restrictions for this procedure. This will be done at our [facility] location:  [place] wants you to follow-up in: 6 months with Dr. Claiborne Billings.  You will receive a reminder letter in the mail two months in advance. If you don't receive a letter, please call our office to schedule the follow-up appointment.    If you need a refill on your cardiac medications before your next appointment, please call your pharmacy.      Signed, Charlene Majestic, MD  09/22/2017 12:06 PM    [facility] [address] [place], [place], [place]  [address] Phone: [phone]

## 2017-09-22 NOTE — Patient Instructions (Signed)
Medication Instructions:  Your physician recommends that you continue on your current medications as directed. Please refer to the Current Medication list given to you today.  Testing/Procedures: Your physician has requested that you have an echocardiogram (Before Feb, 12th). Echocardiography is a painless test that uses sound waves to create images of your heart. It provides your doctor with information about the size and shape of your heart and how well your heart's chambers and valves are working. This procedure takes approximately one hour. There are no restrictions for this procedure. This will be done at our [facility] location:  [place] wants you to follow-up in: 6 months with Dr. Claiborne Billings.  You will receive a reminder letter in the mail two months in advance. If you don't receive a letter, please call our office to schedule the follow-up appointment.    If you need a refill on your cardiac medications before your next appointment, please call your pharmacy.

## 2017-09-23 ENCOUNTER — Ambulatory Visit: Payer: Medicare Other | Admitting: Hematology and Oncology

## 2017-09-23 ENCOUNTER — Other Ambulatory Visit: Payer: Self-pay

## 2017-09-23 ENCOUNTER — Ambulatory Visit: Payer: Medicare Other | Attending: General Surgery | Admitting: Physical Therapy

## 2017-09-23 DIAGNOSIS — M25612 Stiffness of left shoulder, not elsewhere classified: Secondary | ICD-10-CM

## 2017-09-23 DIAGNOSIS — M25611 Stiffness of right shoulder, not elsewhere classified: Secondary | ICD-10-CM | POA: Diagnosis not present

## 2017-09-23 DIAGNOSIS — Z9189 Other specified personal risk factors, not elsewhere classified: Secondary | ICD-10-CM | POA: Diagnosis not present

## 2017-09-23 NOTE — Therapy (Addendum)
[place], [place], [address] Phone: [phone]   Fax:  [phone]  Physical Therapy Evaluation  Patient Details  Name: Charlene Rubio MRN: [id number] Date of Birth: 05-26-1946 Referring Provider: Dr. Fanny Skates   Encounter Date: 09/23/2017  PT End of Session - 09/23/17 1803    Visit Number  1    Number of Visits  4    Date for PT Re-Evaluation  10/31/17    PT Start Time  1525    PT Stop Time  1610    PT Time Calculation (min)  45 min    Activity Tolerance  Patient tolerated treatment well    Behavior During Therapy  [facility] for tasks assessed/performed       Past Medical History:  Diagnosis Date  . Acute diastolic (congestive) heart failure ([place]) 04/25/2017  . Acute respiratory failure with hypoxia and hypercapnia ([place]) 04/25/2017  . ALLERGIC RHINITIS 07/01/2007   Qualifier: Diagnosis of  By: Sherren Mocha MD, Jory Ee   . Allergy   . Aortic insufficiency   . Aortic valve disorder 07/01/2007   Qualifier: Diagnosis of  By: Sherren Mocha MD, Dellis Filbert A   . Bilateral leg edema 04/25/2017  . Borderline diabetic   . Cancer ([place])   . CHF (congestive heart failure) ([place]) 04/25/2017  . Dementia 04/30/2017  . Diabetes mellitus ([place]) 04/25/2017  . DOE (dyspnea on exertion) 04/25/2017  . Essential hypertension 07/01/2007   Qualifier: Diagnosis of  By: Sherren Mocha MD, Jory Ee Glaucoma suspect   . Hypertension   . KNEE PAIN, LEFT 04/06/2010   Qualifier: Diagnosis of  By: Sherren Mocha MD, Jory Ee   . Nonischemic cardiomyopathy ([place])   . Obesity   . Obesity, unspecified 07/01/2007   Qualifier: Diagnosis of  By: Sherren Mocha MD, Jory Ee   . Thyroid disease     Past Surgical History:  Procedure Laterality Date  . CESAREAN SECTION    . MASTECTOMY  08/27/2017   LEFT BREAST MODIFIED RADICAL MASTECTOMY (Left Breast)  . MASTECTOMY  08/27/2017   RIGHT TOTAL MASTECTOMY WITH SENTINEL LYMPH NODE BIOPSY (Right Breast)  . MASTECTOMY W/ SENTINEL NODE  BIOPSY Right 08/27/2017   Procedure: RIGHT TOTAL MASTECTOMY WITH SENTINEL LYMPH NODE BIOPSY;  Surgeon: Fanny Skates, MD;  Location: [place];  Service: General;  Laterality: Right;  . MODIFIED MASTECTOMY Left 08/27/2017   Procedure: LEFT BREAST MODIFIED RADICAL MASTECTOMY;  Surgeon: Fanny Skates, MD;  Location: [place];  Service: General;  Laterality: Left;    There were no vitals filed for this visit.   Subjective Assessment - 09/23/17 1528    Subjective  In April 2018 had heart trouble, which led to discovery of a breast lump.  Dr. Dalbert Batman did surgery.    Patient is accompained by:  Family member husband    Pertinent History  Bilateral mastectomy for breast cancer 08/27/17. Weekly chemo has been recommended, but patient and her husband have not decided about that yet. They will talk to [name] next week about that.  She has cardiac issues; saw a neurologist one time and was told she has mild dementia. Had congestive heart failure and was in rehab for a couple weeks in Septemeber 2018.  Recurrent left LE DVT so is on Eliquis. Balance problems.    Patient Stated Goals  pt. is unsure why she is here    Currently in Pain?  No/denies         [facility] PT Assessment -  09/23/17 0001      Assessment   Medical Diagnosis  bilat. breast cancer with bilat. mastectomy    Referring Provider  Dr. Fanny Skates    Onset Date/Surgical Date  08/27/17    Hand Dominance  Right    Prior Therapy  none      Precautions   Precautions  Other (comment)    Precaution Comments  memory impairment; cancer precautions      Restrictions   Weight Bearing Restrictions  No      Balance Screen   Has the patient fallen in the past 6 months  No    Has the patient had a decrease in activity level because of a fear of falling?   No    Is the patient reluctant to leave their home because of a fear of falling?   No      Home Environment   Living Environment  Private residence    Living Arrangements  Spouse/significant  other    Type of [place]  One level      Prior Function   Leisure  no regular exercise      Cognition   Overall Cognitive Status  Impaired/Different from baseline    Area of Impairment  Memory      Observation/Other Assessments   Observations  Still with two drains on left side; may get them out on Thursday; obvious significant arthritis in both hands    Skin Integrity  long bilat. mastectomy incisions healing well; still with glue on them    Quick DASH   61      ROM / Strength   AROM / PROM / Strength  AROM;PROM      AROM   AROM Assessment Site  Shoulder    Right/Left Shoulder  Right;Left    Right Shoulder Flexion  100 Degrees moves toward scaption    Right Shoulder ABduction  115 Degrees moves toward scaption    Left Shoulder Flexion  115 Degrees moves toward scaption    Left Shoulder ABduction  99 Degrees moves toward scaption      PROM   PROM Assessment Site  Shoulder    Right/Left Shoulder  Right;Left    Right Shoulder Flexion  126 Degrees    Right Shoulder ABduction  90 Degrees some pulling at lateralright incision    Right Shoulder Internal Rotation  -- [facility]    Right Shoulder External Rotation  50 Degrees    Left Shoulder Flexion  135 Degrees    Left Shoulder ABduction  100 Degrees discomfort in arm    Left Shoulder Internal Rotation  -- [facility]    Left Shoulder External Rotation  60 Degrees      Palpation   Palpation comment  popping sensation felt with left shoulder PROM, such as from an arthritic joint        LYMPHEDEMA/ONCOLOGY QUESTIONNAIRE - 09/23/17 1559      Type   Cancer Type  bilat. breast      Surgeries   Mastectomy Date  08/27/17    Number Lymph Nodes Removed  13 on left, 4 positive; right side node negative      Lymphedema Assessments   Lymphedema Assessments  Upper extremities      Right Upper Extremity Lymphedema   10 cm Proximal to Olecranon Process  26.2 cm    Olecranon Process  24.5 cm    10 cm Proximal to Ulnar  Styloid Process  21.6 cm  Just Proximal to Ulnar Styloid Process  16.1 cm    Across Hand at [employer]  19 cm    At [place] of 2nd Digit  7.1 cm      Left Upper Extremity Lymphedema   10 cm Proximal to Olecranon Process  27 cm approx.; exact number was erased by computer program    Olecranon Process  25.2 cm    10 cm Proximal to Ulnar Styloid Process  22.9 cm    Just Proximal to Ulnar Styloid Process  16.5 cm    Across Hand at [employer]  19 cm    At [place] of 2nd Digit  7 cm        Quick Dash - 09/23/17 0001    Open a tight or new jar  Unable    Do heavy household chores (wash walls, wash floors)  Unable    Carry a shopping bag or briefcase  Unable    Wash your back  Unable    Use a knife to cut food  Unable    Recreational activities in which you take some force or impact through your arm, shoulder, or hand (golf, hammering, tennis)  Unable    During the past week, to what extent has your arm, shoulder or hand problem interfered with your normal social activities with family, friends, neighbors, or groups?  Not at all    During the past week, to what extent has your arm, shoulder or hand problem limited your work or other regular daily activities  Quite a bit    Arm, shoulder, or hand pain.  None    Tingling (pins and needles) in your arm, shoulder, or hand  None    Difficulty Sleeping  No difficulty    DASH Score  61.36 %       Objective measurements completed on examination: See above findings.                   PT Long Term Goals - 09/23/17 1814      PT LONG TERM GOAL #1   Title  Patient and her husband will be knowledgeable about how to perform a home exercise program for bilateral shoulder ROM.    Time  3    Period  Weeks    Status  New      PT LONG TERM GOAL #2   Title  Patient and her husband will be educated about lymphedema risk reduction practices.    Time  3    Period  Weeks    Status  New             Plan - 09/23/17 1807     Clinical Impression Statement  This is a pleasant woman who apparently has memory problems labeled mild dementia; it was difficult to keep her on task today--she was indirect in answering questions.  She was accompanied by her husband, who helped with this.  She had bilateral breast cancer with a 6.2 cm. tumor on the left and 1.7 cm. on the right; bilateral mastectomies 08/27/17 with 13 nodes removed on left (4 positive) and negative node on right. She still has drains in place on left side. Family is trying to decide whether to have her undergo chemo.  She does have limited shoulder ROM bilaterally. Some of this may stem from arthritis, but she would benefit from instruction in ROM exercises (to have her husband assist with these).  She does also have a lymphedema risk  so would benefit from some education about that.    History and Personal Factors relevant to plan of care:  dementia, bilateral surgery    Clinical Presentation  Evolving    Clinical Presentation due to:  recent bilateral mastectomies    Clinical Decision Making  Moderate    Rehab Potential  Good    Clinical Impairments Affecting Rehab Potential  dementia    PT Frequency  1x / week    PT Duration  2 weeks    PT Treatment/Interventions  ADLs/Self Care Home Management;DME Instruction;Therapeutic exercise;Patient/family education;Manual techniques;Passive range of motion    PT Next Visit Plan  Instruct patient and her husband in home exercise program for bilateral shoulder ROM.  Educate re: lymphedema risk reduction.    Consulted and Agree with Plan of Care  Patient;Family member/caregiver       Patient will benefit from skilled therapeutic intervention in order to improve the following deficits and impairments:  Decreased range of motion, Decreased knowledge of precautions, Decreased knowledge of use of DME, Impaired UE functional use  Visit Diagnosis: Stiffness of right shoulder, not elsewhere classified - Plan: PT plan of care  cert/re-cert  Stiffness of left shoulder, not elsewhere classified - Plan: PT plan of care cert/re-cert  At risk for lymphedema - Plan: PT plan of care cert/re-cert     Problem List Patient Active Problem List   Diagnosis Date Noted  . Bilateral breast cancer ([place]) 08/27/2017  . 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 Malignant neoplasm of upper-inner quadrant of left breast in female, estrogen receptor positive ([place]) 07/14/2017  . 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 Memory loss or impairment 05/21/2017  . Chronic deep vein thrombosis (DVT) of left lower extremity ([place]) 05/04/2017  . Thrombocytopenia ([place]) 05/04/2017  . Failure to thrive in adult 05/04/2017  . Breast mass, left 05/04/2017  . Dementia 04/30/2017  . DOE (dyspnea on exertion) 04/25/2017  . Bilateral leg edema 04/25/2017  . Acquired leg deformity 04/25/2017  . Thyroid disease 04/25/2017  . Noncompliance 04/25/2017  . Anxiety about health 04/25/2017  . CHF (congestive heart failure) ([place]) 04/25/2017  . Acute respiratory failure with hypoxia and hypercapnia ([place]) 04/25/2017  . Acute diastolic (congestive) heart failure ([place]) 04/25/2017  . Diabetes mellitus ([place]) 04/25/2017  . Hearing loss secondary to cerumen impaction 12/23/2012  . KNEE PAIN, LEFT 04/06/2010  . Obesity, unspecified 07/01/2007  . Aortic valve disorder 07/01/2007  . ALLERGIC RHINITIS 07/01/2007  . Essential hypertension 07/01/2007    Delaine Hernandez 09/23/2017, [place], [place], [address] Phone: [phone]   Fax:  [phone]  Name: ROSANGELICA PEVEHOUSE MRN: [id number] Date of Birth: Mar 16, 1946  Serafina Royals, PT 09/23/17 6:16 PM  PHYSICAL THERAPY DISCHARGE SUMMARY  Visits from Start of Care: 1  Current functional level related to goals / functional  outcomes: Goals not met, as patient did not return after initial evaluation.   Remaining deficits: Unknown, as patient did not return to complete the treatment plan.   Education / Equipment: none Plan: Patient agrees to discharge.  Patient goals were not met. Patient is being discharged due to not returning since the last visit.  ?????    Serafina Royals, PT 12/09/17 11:35 AM

## 2017-09-24 ENCOUNTER — Ambulatory Visit (HOSPITAL_COMMUNITY): Payer: Medicare Other | Attending: Cardiology

## 2017-09-24 ENCOUNTER — Other Ambulatory Visit: Payer: Self-pay

## 2017-09-24 DIAGNOSIS — I5030 Unspecified diastolic (congestive) heart failure: Secondary | ICD-10-CM | POA: Insufficient documentation

## 2017-09-24 DIAGNOSIS — I420 Dilated cardiomyopathy: Secondary | ICD-10-CM | POA: Diagnosis not present

## 2017-09-24 DIAGNOSIS — I428 Other cardiomyopathies: Secondary | ICD-10-CM

## 2017-09-24 DIAGNOSIS — I080 Rheumatic disorders of both mitral and aortic valves: Secondary | ICD-10-CM | POA: Insufficient documentation

## 2017-09-30 ENCOUNTER — Inpatient Hospital Stay: Payer: Medicare Other | Attending: Hematology and Oncology | Admitting: Hematology and Oncology

## 2017-09-30 ENCOUNTER — Other Ambulatory Visit: Payer: Self-pay | Admitting: Hematology and Oncology

## 2017-09-30 DIAGNOSIS — Z5112 Encounter for antineoplastic immunotherapy: Secondary | ICD-10-CM | POA: Diagnosis not present

## 2017-09-30 DIAGNOSIS — C50812 Malignant neoplasm of overlapping sites of left female breast: Principal | ICD-10-CM

## 2017-09-30 DIAGNOSIS — Z170 Estrogen receptor positive status [ER+]: Principal | ICD-10-CM

## 2017-09-30 DIAGNOSIS — C50811 Malignant neoplasm of overlapping sites of right female breast: Secondary | ICD-10-CM

## 2017-09-30 DIAGNOSIS — I10 Essential (primary) hypertension: Secondary | ICD-10-CM | POA: Diagnosis not present

## 2017-09-30 MED ORDER — LIDOCAINE-PRILOCAINE 2.5-2.5 % EX CREA: TOPICAL_CREAM | CUTANEOUS | 3 refills | Status: DC

## 2017-09-30 NOTE — Progress Notes (Signed)
START OFF PATHWAY REGIMEN - Breast   OFF00005:Trastuzumab (Maintenance - w/Loading Dose):   A cycle is every 21 days:     Trastuzumab      Trastuzumab   **Always confirm dose/schedule in your pharmacy ordering system**    Patient Characteristics: Postoperative without Neoadjuvant Therapy (Pathologic Staging), Invasive Disease, Adjuvant Therapy, HER2 Positive, ER Positive, Node Positive Therapeutic Status: Postoperative without Neoadjuvant Therapy (Pathologic Staging) AJCC Grade: G2 AJCC N Category: pN2 AJCC M Category: cM0 ER Status: Positive (+) AJCC 8 Stage Grouping: IIIA HER2 Status: Positive (+) Oncotype Dx Recurrence Score: Not Appropriate AJCC T Category: pT3 PR Status: Negative (-) Intent of Therapy: Curative Intent, Discussed with Patient

## 2017-09-30 NOTE — Progress Notes (Signed)
Patient Care Team: [name], DO as PCP - General (Internal Medicine)  DIAGNOSIS:  Encounter Diagnosis  Name Primary?  . 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, estrogen receptor positive ([place])   06/23/2017 Initial Diagnosis    Left breast 11 o'clock position 6.3 x 4.2 x 6.1 cm mass with thick papillary projections, 2 adjacent left axillary lymph nodes; right breast 930 position 1.7 cm mass; left breast and left lymph node biopsy revealed IDC grade 3, ER 5%, PR 0%, Ki-67 50%, HER-2 positive ratio 2.04 T3N1 stage IIIa; right breast: Grade 1 IDC  ER 95%, PR 95%, Ki-67 15%, HER-2 negative; T1CN0 stage I a      08/27/2017 Surgery    Right simple mastectomy: IDC grade 1, 1.1 cm, DCIS low-grade, margins negative, 0/2 lymph nodes negative; ER 95%, PR 95%, HER-2 negative Ki-67 15% T1 cN0 stage I a left simple mastectomy: IDC grade 3, 6.2 cm, 4/13 lymph nodes positive, margins negative, ER 5 %, PR 0%, HER-2 positive ratio 2.04, Ki-67 50 % T3 N2 stage IIIa        CHIEF COMPLIANT: Follow-up to discuss her treatment plan  INTERVAL HISTORY: Charlene Rubio is a 72 year old with above-mentioned history of bilateral mastectomies for bilateral breast cancers.  She was quite frail immediately postoperatively so we decided to postpone any decisions regarding adjuvant treatment until now.  She has recovered very well from the surgery.  Energy levels are excellent.  Recent echocardiogram revealed an ejection fraction of 50%.  She is here today accompanied by her husband and daughter to discuss adjuvant treatment options.  REVIEW OF SYSTEMS:   Constitutional: Denies fevers, chills or abnormal weight loss Eyes: Denies blurriness of vision Ears, nose, mouth, throat, and face: Denies mucositis or sore throat Respiratory: Denies cough, dyspnea  or wheezes Cardiovascular: Denies palpitation, chest discomfort Gastrointestinal:  Denies nausea, heartburn or change in bowel habits Skin: Denies abnormal skin rashes Lymphatics: Denies new lymphadenopathy or easy bruising Neurological:Denies numbness, tingling or new weaknesses Behavioral/Psych: Mood is stable, no new changes  Extremities: No lower extremity edema Breast: Bilateral mastectomies All other systems were reviewed with the patient and are negative.  I have reviewed the past medical history, past surgical history, social history and family history with the patient and they are unchanged from previous note.  ALLERGIES:  has No Known Allergies.  MEDICATIONS:  Current Outpatient Medications  Medication Sig Dispense Refill  . apixaban (ELIQUIS) 5 MG TABS tablet Take 1 tablet (5 mg total) by mouth 2 (two) times daily. 60 tablet 5  . aspirin EC 81 MG EC tablet Take 1 tablet (81 mg total) by mouth daily.    . carvedilol (COREG) 6.25 MG tablet Take 1 tablet (6.25 mg total) by mouth 2 (two) times daily with a meal. 180 tablet 3  . donepezil (ARICEPT) 10 MG tablet Take 1 tablet (10 mg total) by mouth at bedtime. 30 tablet 6  . furosemide (LASIX) 40 MG tablet Take 1.5 tablets (60 mg total) by mouth daily. 145 tablet 3  . isosorbide mononitrate (IMDUR) 30 MG 24 hr tablet Take 0.5 tablets (15 mg total) by mouth daily. 45 tablet 3  . letrozole (FEMARA) 2.5 MG tablet Take 1 tablet (2.5 mg total) by mouth daily. 90 tablet 3  . lisinopril (PRINIVIL,ZESTRIL) 20 MG tablet Take 1 tablet (20  mg total) by mouth daily. 90 tablet 3  . metFORMIN (GLUCOPHAGE-XR) 500 MG 24 hr tablet Take 1 tablet (500 mg total) by mouth 2 (two) times daily with a meal. 180 tablet 1  . Pediatric Multiple Vitamins (CHEWABLE MULTIPLE VITAMINS PO) Take 1 tablet by mouth 3 (three) times a week.     No current facility-administered medications for this visit.     PHYSICAL EXAMINATION: ECOG PERFORMANCE STATUS: 1 -  Symptomatic but completely ambulatory  Vitals:   09/30/17 0916  BP: (!) 199/69  Pulse: 65  Resp: 16  Temp: 98.1 F (36.7 C)  SpO2: 99%   Filed Weights   09/30/17 0916  Weight: 158 lb 8 oz (71.9 kg)    GENERAL:alert, no distress and comfortable SKIN: skin color, texture, turgor are normal, no rashes or significant lesions EYES: normal, Conjunctiva are pink and non-injected, sclera clear OROPHARYNX:no exudate, no erythema and lips, buccal mucosa, and tongue normal  NECK: supple, thyroid normal size, non-tender, without nodularity LYMPH:  no palpable lymphadenopathy in the cervical, axillary or inguinal LUNGS: clear to auscultation and percussion with normal breathing effort HEART: regular rate & rhythm and no murmurs and no lower extremity edema ABDOMEN:abdomen soft, non-tender and normal bowel sounds MUSCULOSKELETAL:no cyanosis of digits and no clubbing  NEURO: alert & oriented x 3 with fluent speech, no focal motor/sensory deficits EXTREMITIES: No lower extremity edema  LABORATORY DATA:  I have reviewed the data as listed CMP Latest Ref Rng & Units 09/12/2017 08/28/2017 08/27/2017  Glucose 65 - 139 mg/dL 125 107(H) -  BUN 7 - 25 mg/dL 18 8 -  Creatinine 0.60 - 0.93 mg/dL 0.66 0.69 0.49  Sodium 135 - 146 mmol/L 145 139 -  Potassium 3.5 - 5.3 mmol/L 3.6 2.8(L) -  Chloride 98 - 110 mmol/L 108 103 -  CO2 20 - 32 mmol/L 29 28 -  Calcium 8.6 - 10.4 mg/dL 9.1 8.6(L) -  Total Protein 6.5 - 8.1 g/dL - - -  Total Bilirubin 0.3 - 1.2 mg/dL - - -  Alkaline Phos 38 - 126 U/L - - -  AST 15 - 41 U/L - - -  ALT 14 - 54 U/L - - -    Lab Results  Component Value Date   WBC 7.5 08/28/2017   HGB 11.4 (L) 08/28/2017   HCT 35.3 (L) 08/28/2017   MCV 97.2 08/28/2017   PLT 131 (L) 08/28/2017   NEUTROABS 4.3 08/25/2017    ASSESSMENT & PLAN:  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  However she could tolerate adjuvant Herceptin based on recent echocardiogram.  Recommendation: 1. Adjuvant Herceptin 2.  Adjuvant radiation therapy followed by 3. adjuvant antiestrogen therapy  Severe hypertension Patient will need echocardiograms every 3 months We will request for port placement. We will send radiation referral.  I spent 25 minutes talking to the patient of which more than half was spent in counseling and coordination of care.  No orders of the defined types were  placed in this encounter.  The patient has a good understanding of the overall plan. she agrees with it. she will call with any problems that may develop before the next visit here.   Harriette Ohara, MD 09/30/17

## 2017-09-30 NOTE — Assessment & Plan Note (Signed)
Right simple mastectomy: IDC grade 1, 1.1 cm, DCIS low-grade, margins negative, 0/2 lymph nodes negative; ER 95%, PR 95%, HER-2 negative Ki-67 15% T1 cN0 stage I a left simple mastectomy: IDC grade 3, 6.2 cm, 4/13 lymph nodes positive, margins negative, ER 5 %, PR 0%, HER-2 positive ratio 2.04, Ki-67 50 % T3 N2 stage IIIa Echocardiogram 09/24/2017: EF 50%  Based on her performance status and frailty, she is not a good candidate for systemic chemotherapy.  However she could tolerate adjuvant Herceptin based on recent echocardiogram.  Recommendation: 1. Adjuvant Herceptin 2.  Adjuvant radiation therapy followed by 3. adjuvant antiestrogen therapy  Severe hypertension Patient will need echocardiograms every 3 months We will request for port placement. We will send radiation referral.

## 2017-10-01 ENCOUNTER — Other Ambulatory Visit: Payer: Self-pay | Admitting: General Surgery

## 2017-10-01 ENCOUNTER — Other Ambulatory Visit: Payer: Self-pay | Admitting: *Deleted

## 2017-10-01 DIAGNOSIS — Z170 Estrogen receptor positive status [ER+]: Principal | ICD-10-CM

## 2017-10-01 DIAGNOSIS — C50812 Malignant neoplasm of overlapping sites of left female breast: Principal | ICD-10-CM

## 2017-10-01 DIAGNOSIS — C50811 Malignant neoplasm of overlapping sites of right female breast: Secondary | ICD-10-CM

## 2017-10-02 ENCOUNTER — Encounter: Payer: Self-pay | Admitting: Radiation Oncology

## 2017-10-03 ENCOUNTER — Ambulatory Visit: Payer: Medicare Other | Admitting: Physical Therapy

## 2017-10-03 ENCOUNTER — Inpatient Hospital Stay (HOSPITAL_COMMUNITY)
Admission: RE | Admit: 2017-10-03 | Discharge: 2017-10-03 | Disposition: A | Discharge status: Home / Self Care | Payer: Medicare Other | Source: Ambulatory Visit | Attending: Hematology and Oncology | Admitting: Hematology and Oncology

## 2017-10-03 NOTE — Progress Notes (Signed)
Location of Breast Cancer: Right and Left Breast  Histology per Pathology Report:  06/23/17 Diagnosis 1. Breast, right, needle core biopsy, 9:30 o'clock - INVASIVE DUCTAL CARCINOMA, SEE COMMENT. 2. Lymph node, axilla and upper limb, left axilla - METASTATIC MAMMARY CARCINOMA. 3. Breast, left, needle core biopsy, 11:00 o'clock - INVASIVE DUCTAL CARCINOMA, SEE COMMENT.  1) Receptor Status: ER(95%), PR (95%), Her2-neu (NEG), Ki-(15%)  3) Receptor Status: ER(5%), PR (NEG), Her2-neu (POS), Ki- (50%)  08/27/17 Diagnosis 1. Breast, simple mastectomy, Right - INVASIVE DUCTAL CARCINOMA, GRADE I/III SPANNING 1.7 CM. - DUCTAL CARCINOMA IN SITU, LOW GRADE. - SURGICAL RESECTION MARGINS ARE NEGATIVE FOR CARCINOMA. - SEE ONCOLOGY TABLE BELOW. 2. Lymph node, sentinel, biopsy, Right axillary - THERE IS NO EVIDENCE OF CARCINOMA IN 1 OF 1 LYMPH NODE (0/1). 3. Lymph node, sentinel, biopsy, Right axillary - THERE IS NO EVIDENCE OF CARCINOMA IN 1 OF 1 LYMPH NODE (0/1). 4. Breast, simple mastectomy, Left - INVASIVE DUCTAL CARCINOMA, GRADE III/III, SPANNING 6.2 CM. - METASTATIC CARCINOMA IN 4 OF 13 LYMPH NODES (4/13). - THE SURGICAL RESECTION MARGINS ARE NEGATIVE FOR CARCINOMA. - SEE ONCOLOGY TABLE BELOW.  Did patient present with symptoms or was this found on screening mammography?: She was noted to have a lump on her left breast while in the hospital for heart issues. She does have some early dementia.   Past/Anticipated interventions by surgeon, if any: 08/27/17 Procedure:                 Inject blue dye right breast                                      right total mastectomy                                      Right axillary deep sentinel lymph node biopsy                                      Left modified radical mastectomy   Surgeon:                     Edsel Petrin. Dalbert Batman, M.D., FACS Assistant:                      Sharyn Dross, RNFA   Past/Anticipated interventions by medical oncology, if  any:  09/30/17 [name] Recommendation: 1. Adjuvant Herceptin (she will start Tuesday 10/14/17) 2.  Adjuvant radiation therapy followed by 3. adjuvant antiestrogen therapy  Severe hypertension Patient will need echocardiograms every 3 months We will request for port placement. We will send radiation referral   Lymphedema issues, if any:  No.   Pain issues, if any: She reports pain to her right neck from sleeping on it wrong. She also had a portacath placed yesterday.    SAFETY ISSUES:  Prior radiation? No  Pacemaker/ICD? No  Possible current pregnancy? No  Is the patient on methotrexate? No  Current Complaints / other details:   She has dementia. Her husband and daughter in-law are here with her today.   BP (!) 152/55   Pulse 69   Temp 98.5 F (36.9 C)   Ht '5\' 6"'  (1.676 m)   Wt  159 lb (72.1 kg)   LMP  (LMP Unknown)   SpO2 100% Comment: room air  BMI 25.66 kg/m    Wt Readings from Last 3 Encounters:  10/10/17 159 lb (72.1 kg)  10/09/17 158 lb (71.7 kg)  09/30/17 158 lb 8 oz (71.9 kg)      Lovina Zuver, Stephani Police, RN 10/03/2017,10:03 AM

## 2017-10-03 NOTE — CV Procedure (Signed)
No show for a 2D Echo  Charlene Rubio

## 2017-10-05 NOTE — H&P (Signed)
Charlene Rubio Location: [facility] Rubio #: [id number] DOB: Apr 21, 1946 Married / Language: English / Race: White Female       History of Present Illness      Charlene Rubio is a 72 year old female who presents with breast cancer. This is a 72 year old female who returns for a postop visit following bilateral mastectomy and to discuss Port-A-Cath insertion which is scheduled for next week. Her husband is with her throughout Charlene encounter. [name] is her oncologist. Dr. [name] is her PCP. Dr. Georgina Peer is her cardiologist. 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 Negative margins. ER and PR negative. HER-2 was detected.      She has seen [name]. Her cardiac evaluation was surprisingly favorable and so he has requested that I put a Port-A-Cath in which is scheduled for next week. I spent a long time discussing Charlene indications details techniques and risks of Port-A-Cath insertion with Charlene Rubio and her husband. They're aware of Charlene risk of bleeding, infection, pneumothorax, cardiac arrhythmia, multiple attempts bilaterally, malfunction requiring revision and other unforeseen problems. They understand these issues. All other questions were answered. They agree with this plan       We removed Charlene remaining drain on Charlene left side today as Charlene drainage was less than 15 cc per day. Wounds otherwise look good Continue physical therapy She has a prescription for mastectomy supplies I will see her next week when we put Charlene Port-A-Cath and an follow-up after that      I told Charlene Rubio and her husband that it was important to discontinue Charlene Eliquis blood thinner 3 days preop. This was also presented in their discharge  instructions    Diagnostic Studies History  Colonoscopy  never Mammogram  within last year Pap Smear  >5 years ago  Allergies  No Known Drug Allergies  Allergies Reconciled   Medication History  Donepezil HCl (10MG Tablet, Oral) Active. Donepezil HCl (5MG Tablet, Oral) Active. Letrozole (2.5MG Tablet, Oral) Active. Carvedilol (6.25MG Tablet, Oral) Active. MetFORMIN HCl ER (500MG Tablet ER 24HR, Oral) Active. Eliquis (5MG Tablet, Oral) Active. Furosemide (40MG Tablet, Oral) Active. Isosorbide Mononitrate ER (30MG Tablet ER 24HR, Oral) Active. Lisinopril (10MG Tablet, Oral) Active. Aspirin EC (81MG Tablet DR, Oral) Active. Lisinopril (20MG Tablet, Oral) Active. Klor-Con M10 (10MEQ Tablet ER, Oral) Active. Lidocaine-Prilocaine (2.5-2.5% Cream, External) Active. Medications Reconciled  Vitals  Weight: 159.4 lb Height: 66in Body Surface Area: 1.82 m Body Mass Index: 25.73 kg/m  Temp.: 98.25F  Pulse: 96 (Regular)        Physical Exam  General Mental Status-Alert. General Appearance-Not in acute distress. Build & Nutrition-Well nourished. Posture-Normal posture. Gait-Normal.  Head and Neck Head-normocephalic, atraumatic with no lesions or palpable masses. Trachea-midline. Thyroid Gland Characteristics - normal size and consistency and no palpable nodules.  Chest and Lung Exam Chest and lung exam reveals -on auscultation, normal breath sounds, no adventitious sounds and normal vocal resonance.. No deformity of clavicles  Breast Note: Bilateral mastectomy incisions look good. No infection. No seroma. No skin necrosis. Remaining left-sided drain removed   Cardiovascular Cardiovascular examination reveals -normal heart sounds, regular rate and rhythm with no murmurs and femoral artery auscultation bilaterally reveals normal  pulses, no bruits, no thrills.  Abdomen Inspection Inspection of Charlene abdomen reveals - No  Hernias. Palpation/Percussion Palpation and Percussion of Charlene abdomen reveal - Soft, Non Tender, No Rigidity (guarding), No hepatosplenomegaly and No Palpable abdominal masses.  Neurologic Neurologic evaluation reveals -alert and oriented x 3 with no impairment of recent or remote memory, normal attention span and ability to concentrate, normal sensation and normal coordination.  Neuropsychiatric Note: Pleasant and cooperative. Insight is reasonable but imperfect. She has some memory disorder.   Musculoskeletal Normal Exam - Bilateral-Upper Extremity Strength Normal and Lower Extremity Strength Normal.    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 There is no residual fluid collection We removed Charlene last drain today  You are scheduled for Port-A-Cath insertion next week so 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 Dalbert Batman, M.D., [facility] General and Minimally invasive Surgery Breast and Colorectal Surgery Office:   [phone] Pager:   [phone]

## 2017-10-05 NOTE — H&P (View-Only) (Signed)
Charlene Rubio Location: [facility] Patient #: [id number] DOB: 1946-05-28 Married / Language: English / Race: White Female       History of Present Illness      The patient is a 72 year old female who presents with breast cancer. This is a 72 year old female who returns for a postop visit following bilateral mastectomy and to discuss Port-A-Cath insertion which is scheduled for next week. Her husband is with her throughout the encounter. [name] is her oncologist. Dr. [name] is her PCP. Dr. Georgina Peer is her cardiologist. [name] is her neurologist      On the right side she had a 1.7 cm invasive carcinoma, receptor positive, HER-2 negative, negative nodes. 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 Negative margins. ER and PR negative. HER-2 was detected.      She has seen [name]. Her cardiac evaluation was surprisingly favorable and so he has requested that I put a Port-A-Cath in which is scheduled for next week. I spent a long time discussing the indications details techniques and risks of Port-A-Cath insertion with the patient and her husband. They're aware of the risk of bleeding, infection, pneumothorax, cardiac arrhythmia, multiple attempts bilaterally, malfunction requiring revision and other unforeseen problems. They understand these issues. All other questions were answered. They agree with this plan       We removed the remaining drain on the left side today as the drainage was less than 15 cc per day. Wounds otherwise look good Continue physical therapy She has a prescription for mastectomy supplies I will see her next week when we put the Port-A-Cath and an follow-up after that      I told the patient and her husband that it was important to discontinue the Eliquis blood thinner 3 days preop. This was also presented in their discharge  instructions    Diagnostic Studies History  Colonoscopy  never Mammogram  within last year Pap Smear  >5 years ago  Allergies  No Known Drug Allergies  Allergies Reconciled   Medication History  Donepezil HCl (10MG Tablet, Oral) Active. Donepezil HCl (5MG Tablet, Oral) Active. Letrozole (2.5MG Tablet, Oral) Active. Carvedilol (6.25MG Tablet, Oral) Active. MetFORMIN HCl ER (500MG Tablet ER 24HR, Oral) Active. Eliquis (5MG Tablet, Oral) Active. Furosemide (40MG Tablet, Oral) Active. Isosorbide Mononitrate ER (30MG Tablet ER 24HR, Oral) Active. Lisinopril (10MG Tablet, Oral) Active. Aspirin EC (81MG Tablet DR, Oral) Active. Lisinopril (20MG Tablet, Oral) Active. Klor-Con M10 (10MEQ Tablet ER, Oral) Active. Lidocaine-Prilocaine (2.5-2.5% Cream, External) Active. Medications Reconciled  Vitals  Weight: 159.4 lb Height: 66in Body Surface Area: 1.82 m Body Mass Index: 25.73 kg/m  Temp.: 98.7F  Pulse: 96 (Regular)        Physical Exam  General Mental Status-Alert. General Appearance-Not in acute distress. Build & Nutrition-Well nourished. Posture-Normal posture. Gait-Normal.  Head and Neck Head-normocephalic, atraumatic with no lesions or palpable masses. Trachea-midline. Thyroid Gland Characteristics - normal size and consistency and no palpable nodules.  Chest and Lung Exam Chest and lung exam reveals -on auscultation, normal breath sounds, no adventitious sounds and normal vocal resonance.. No deformity of clavicles  Breast Note: Bilateral mastectomy incisions look good. No infection. No seroma. No skin necrosis. Remaining left-sided drain removed   Cardiovascular Cardiovascular examination reveals -normal heart sounds, regular rate and rhythm with no murmurs and femoral artery auscultation bilaterally reveals normal  pulses, no bruits, no thrills.  Abdomen Inspection Inspection of the abdomen reveals - No  Hernias. Palpation/Percussion Palpation and Percussion of the abdomen reveal - Soft, Non Tender, No Rigidity (guarding), No hepatosplenomegaly and No Palpable abdominal masses.  Neurologic Neurologic evaluation reveals -alert and oriented x 3 with no impairment of recent or remote memory, normal attention span and ability to concentrate, normal sensation and normal coordination.  Neuropsychiatric Note: Pleasant and cooperative. Insight is reasonable but imperfect. She has some memory disorder.   Musculoskeletal Normal Exam - Bilateral-Upper Extremity Strength Normal and Lower Extremity Strength Normal.    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 There is no residual fluid collection We removed the last drain today  You are scheduled for Port-A-Cath insertion next week so 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 General and Minimally invasive Surgery Breast and Colorectal Surgery Office:   [phone] Pager:   [phone]

## 2017-10-06 ENCOUNTER — Inpatient Hospital Stay: Payer: Medicare Other

## 2017-10-06 ENCOUNTER — Encounter: Payer: Self-pay | Admitting: *Deleted

## 2017-10-06 ENCOUNTER — Other Ambulatory Visit: Payer: Self-pay

## 2017-10-06 ENCOUNTER — Encounter (HOSPITAL_COMMUNITY): Payer: Self-pay | Admitting: *Deleted

## 2017-10-06 NOTE — Progress Notes (Signed)
Spoke with pt's husband, [name] for pre-op call. Pt has mild dementia. He states pt has hx of CHF, pt's cardiologist is Dr. Claiborne Billings. Pt is a type 2 diabetic. Last A1C was 6.6 on 07/16/17. Mr. Hitch states they do not check pt's blood sugar at home. Instructed Mr. Abadi not to give Metformin to pt in the AM.  Pt is on Eliquis, last dose per Mr. Uecker was Friday evening, 10/03/17.

## 2017-10-07 ENCOUNTER — Encounter: Payer: Self-pay | Admitting: Hematology and Oncology

## 2017-10-07 ENCOUNTER — Ambulatory Visit (HOSPITAL_COMMUNITY)
Admission: RE | Admit: 2017-10-07 | Discharge: 2017-10-07 | Disposition: A | Discharge status: Home / Self Care | Payer: Medicare Other | Source: Ambulatory Visit | Attending: General Surgery | Admitting: General Surgery

## 2017-10-07 ENCOUNTER — Encounter: Payer: Medicare Other | Admitting: Physical Therapy

## 2017-10-07 HISTORY — DX: Unspecified osteoarthritis, unspecified site: M19.90

## 2017-10-07 HISTORY — DX: Peripheral vascular disease, unspecified: I73.9

## 2017-10-07 NOTE — Progress Notes (Signed)
Called pt to introduce myself as her Arboriculturist and to discuss copay assistance. I spoke to pt's spouse and he gave me consent to apply in her behalf so I applied to the [employer]and she was approvedfor $4,000 for 12 months from2/19/19 with a 6 month look back period.$2,500is her standard award, if her need exceeds $2,500, she will be able to access the additional $6,381R/R reapplication or paperwork.  I also informed him of the [employer] and went over what it covers.  They would like to apply so he will bring proof of income on 10/14/17.  If approved I will give her an expense sheet as well as my card to contact me if she has any questions or concerns in the future.

## 2017-10-08 NOTE — Progress Notes (Signed)
Notified pt's husband of time of arrival for surgery tomorrow is 10:30 AM (pre-op call done on 10/06/17). He states last dose of Eliquis was still 10/03/17.

## 2017-10-09 ENCOUNTER — Ambulatory Visit (HOSPITAL_COMMUNITY): Payer: Medicare Other | Admitting: Certified Registered"

## 2017-10-09 ENCOUNTER — Encounter (HOSPITAL_COMMUNITY)
Admission: RE | Disposition: A | Discharge status: Home / Self Care | Payer: Self-pay | Source: Ambulatory Visit | Attending: General Surgery

## 2017-10-09 ENCOUNTER — Ambulatory Visit (HOSPITAL_COMMUNITY): Payer: Medicare Other

## 2017-10-09 ENCOUNTER — Ambulatory Visit (HOSPITAL_COMMUNITY)
Admission: RE | Admit: 2017-10-09 | Discharge: 2017-10-09 | Disposition: A | Discharge status: Home / Self Care | Payer: Medicare Other | Source: Ambulatory Visit | Attending: General Surgery | Admitting: General Surgery

## 2017-10-09 ENCOUNTER — Encounter (HOSPITAL_COMMUNITY): Payer: Self-pay | Admitting: Certified Registered"

## 2017-10-09 DIAGNOSIS — C773 Secondary and unspecified malignant neoplasm of axilla and upper limb lymph nodes: Secondary | ICD-10-CM | POA: Diagnosis not present

## 2017-10-09 DIAGNOSIS — C50811 Malignant neoplasm of overlapping sites of right female breast: Secondary | ICD-10-CM

## 2017-10-09 DIAGNOSIS — C50812 Malignant neoplasm of overlapping sites of left female breast: Secondary | ICD-10-CM

## 2017-10-09 DIAGNOSIS — C50911 Malignant neoplasm of unspecified site of right female breast: Secondary | ICD-10-CM | POA: Diagnosis not present

## 2017-10-09 DIAGNOSIS — Z7984 Long term (current) use of oral hypoglycemic drugs: Secondary | ICD-10-CM | POA: Diagnosis not present

## 2017-10-09 DIAGNOSIS — F419 Anxiety disorder, unspecified: Secondary | ICD-10-CM | POA: Diagnosis not present

## 2017-10-09 DIAGNOSIS — C50212 Malignant neoplasm of upper-inner quadrant of left female breast: Secondary | ICD-10-CM | POA: Diagnosis not present

## 2017-10-09 DIAGNOSIS — I5030 Unspecified diastolic (congestive) heart failure: Secondary | ICD-10-CM | POA: Insufficient documentation

## 2017-10-09 DIAGNOSIS — I110 Hypertensive heart disease with heart failure: Secondary | ICD-10-CM | POA: Insufficient documentation

## 2017-10-09 DIAGNOSIS — Z79899 Other long term (current) drug therapy: Secondary | ICD-10-CM | POA: Insufficient documentation

## 2017-10-09 DIAGNOSIS — C50912 Malignant neoplasm of unspecified site of left female breast: Secondary | ICD-10-CM | POA: Diagnosis not present

## 2017-10-09 DIAGNOSIS — Z9013 Acquired absence of bilateral breasts and nipples: Secondary | ICD-10-CM | POA: Insufficient documentation

## 2017-10-09 DIAGNOSIS — E1151 Type 2 diabetes mellitus with diabetic peripheral angiopathy without gangrene: Secondary | ICD-10-CM | POA: Insufficient documentation

## 2017-10-09 DIAGNOSIS — Z4682 Encounter for fitting and adjustment of non-vascular catheter: Secondary | ICD-10-CM | POA: Diagnosis not present

## 2017-10-09 DIAGNOSIS — Z419 Encounter for procedure for purposes other than remedying health state, unspecified: Secondary | ICD-10-CM

## 2017-10-09 DIAGNOSIS — F0390 Unspecified dementia without behavioral disturbance: Secondary | ICD-10-CM | POA: Diagnosis not present

## 2017-10-09 DIAGNOSIS — Z7982 Long term (current) use of aspirin: Secondary | ICD-10-CM | POA: Insufficient documentation

## 2017-10-09 DIAGNOSIS — Z7901 Long term (current) use of anticoagulants: Secondary | ICD-10-CM | POA: Insufficient documentation

## 2017-10-09 DIAGNOSIS — Z170 Estrogen receptor positive status [ER+]: Secondary | ICD-10-CM | POA: Diagnosis not present

## 2017-10-09 DIAGNOSIS — I080 Rheumatic disorders of both mitral and aortic valves: Secondary | ICD-10-CM | POA: Diagnosis not present

## 2017-10-09 DIAGNOSIS — Z452 Encounter for adjustment and management of vascular access device: Secondary | ICD-10-CM | POA: Diagnosis not present

## 2017-10-09 DIAGNOSIS — I5031 Acute diastolic (congestive) heart failure: Secondary | ICD-10-CM | POA: Diagnosis not present

## 2017-10-09 DIAGNOSIS — Z95828 Presence of other vascular implants and grafts: Secondary | ICD-10-CM

## 2017-10-09 HISTORY — PX: PORTACATH PLACEMENT: SHX2246

## 2017-10-09 LAB — CBC
HEMATOCRIT: 39.6 % (ref 36.0–46.0)
HEMOGLOBIN: 13.3 g/dL (ref 12.0–15.0)
MCH: 31.9 pg (ref 26.0–34.0)
MCHC: 33.6 g/dL (ref 30.0–36.0)
MCV: 95 fL (ref 78.0–100.0)
Platelets: 215 10*3/uL (ref 150–400)
RBC: 4.17 MIL/uL (ref 3.87–5.11)
RDW: 13.3 % (ref 11.5–15.5)
WBC: 5.1 10*3/uL (ref 4.0–10.5)

## 2017-10-09 LAB — GLUCOSE, CAPILLARY
GLUCOSE-CAPILLARY: 115 mg/dL — AB (ref 65–99)
Glucose-Capillary: 96 mg/dL (ref 65–99)

## 2017-10-09 LAB — PROTIME-INR
INR: 1.04
Prothrombin Time: 13.5 seconds (ref 11.4–15.2)

## 2017-10-09 LAB — BASIC METABOLIC PANEL
ANION GAP: 11 (ref 5–15)
BUN: 14 mg/dL (ref 6–20)
CHLORIDE: 109 mmol/L (ref 101–111)
CO2: 21 mmol/L — AB (ref 22–32)
CREATININE: 0.53 mg/dL (ref 0.44–1.00)
Calcium: 9.1 mg/dL (ref 8.9–10.3)
GFR calc non Af Amer: >60 mL/min (ref >60)
Glucose, Bld: 119 mg/dL — ABNORMAL HIGH (ref 65–99)
Potassium: 3.9 mmol/L (ref 3.5–5.1)
SODIUM: 141 mmol/L (ref 135–145)

## 2017-10-09 LAB — HEMOGLOBIN A1C
HEMOGLOBIN A1C: 6.1 % — AB (ref 4.8–5.6)
MEAN PLASMA GLUCOSE: 128.37 mg/dL

## 2017-10-09 SURGERY — INSERTION, TUNNELED CENTRAL VENOUS DEVICE, WITH PORT
Anesthesia: General | Site: Chest | Laterality: Right

## 2017-10-09 MED ORDER — ACETAMINOPHEN 500 MG PO TABS
1000.0000 mg | ORAL_TABLET | ORAL | Status: AC
  Administered 2017-10-09: 1000 mg via ORAL

## 2017-10-09 MED ORDER — LIDOCAINE-EPINEPHRINE 1 %-1:100000 IJ SOLN
INTRAMUSCULAR | Status: AC
  Filled 2017-10-09: qty 1

## 2017-10-09 MED ORDER — ONDANSETRON HCL 4 MG/2ML IJ SOLN: 4.0000 mg | Freq: Once | INTRAMUSCULAR | Status: DC | PRN

## 2017-10-09 MED ORDER — PROPOFOL 10 MG/ML IV BOLUS
INTRAVENOUS | Status: AC
  Filled 2017-10-09: qty 20

## 2017-10-09 MED ORDER — LACTATED RINGERS IV SOLN
INTRAVENOUS | Status: DC
Start: 2017-10-09 — End: 2017-10-09
  Administered 2017-10-09 (×3): via INTRAVENOUS

## 2017-10-09 MED ORDER — FENTANYL CITRATE (PF) 100 MCG/2ML IJ SOLN
INTRAMUSCULAR | Status: DC | PRN
  Administered 2017-10-09: 25 ug via INTRAVENOUS

## 2017-10-09 MED ORDER — LIDOCAINE-EPINEPHRINE (PF) 1 %-1:200000 IJ SOLN
INTRAMUSCULAR | Status: DC | PRN
  Administered 2017-10-09: 9 mL

## 2017-10-09 MED ORDER — GABAPENTIN 300 MG PO CAPS
ORAL_CAPSULE | ORAL | Status: AC
  Filled 2017-10-09: qty 1

## 2017-10-09 MED ORDER — SODIUM CHLORIDE 0.9 % IV SOLN
INTRAVENOUS | Status: DC | PRN
  Administered 2017-10-09: 16:00:00

## 2017-10-09 MED ORDER — GLYCOPYRROLATE 0.2 MG/ML IJ SOLN
INTRAMUSCULAR | Status: DC | PRN
  Administered 2017-10-09: 0.2 mg via INTRAVENOUS

## 2017-10-09 MED ORDER — CEFAZOLIN SODIUM-DEXTROSE 2-4 GM/100ML-% IV SOLN
2.0000 g | INTRAVENOUS | Status: AC
  Administered 2017-10-09: 2 g via INTRAVENOUS

## 2017-10-09 MED ORDER — IOPAMIDOL (ISOVUE-300) INJECTION 61%
INTRAVENOUS | Status: AC
  Filled 2017-10-09: qty 50

## 2017-10-09 MED ORDER — ONDANSETRON HCL 4 MG/2ML IJ SOLN
INTRAMUSCULAR | Status: DC | PRN
  Administered 2017-10-09: 4 mg via INTRAVENOUS

## 2017-10-09 MED ORDER — LIDOCAINE HCL (CARDIAC) 20 MG/ML IV SOLN
INTRAVENOUS | Status: DC | PRN
  Administered 2017-10-09: 100 mg via INTRATRACHEAL

## 2017-10-09 MED ORDER — CHLORHEXIDINE GLUCONATE CLOTH 2 % EX PADS: 6.0000 | MEDICATED_PAD | Freq: Once | CUTANEOUS | Status: DC

## 2017-10-09 MED ORDER — FENTANYL CITRATE (PF) 250 MCG/5ML IJ SOLN
INTRAMUSCULAR | Status: AC
  Filled 2017-10-09: qty 5

## 2017-10-09 MED ORDER — GABAPENTIN 300 MG PO CAPS
300.0000 mg | ORAL_CAPSULE | ORAL | Status: AC
  Administered 2017-10-09: 300 mg via ORAL

## 2017-10-09 MED ORDER — FENTANYL CITRATE (PF) 100 MCG/2ML IJ SOLN: 25.0000 ug | INTRAMUSCULAR | Status: DC | PRN

## 2017-10-09 MED ORDER — CEFAZOLIN SODIUM-DEXTROSE 2-4 GM/100ML-% IV SOLN
INTRAVENOUS | Status: AC
Start: 2017-10-09 — End: 2017-10-09
  Filled 2017-10-09: qty 100

## 2017-10-09 MED ORDER — HEPARIN SOD (PORK) LOCK FLUSH 100 UNIT/ML IV SOLN
INTRAVENOUS | Status: AC
  Filled 2017-10-09: qty 5

## 2017-10-09 MED ORDER — HEPARIN SOD (PORK) LOCK FLUSH 100 UNIT/ML IV SOLN
INTRAVENOUS | Status: DC | PRN
  Administered 2017-10-09: 500 [IU]

## 2017-10-09 MED ORDER — PROPOFOL 10 MG/ML IV BOLUS
INTRAVENOUS | Status: DC | PRN
  Administered 2017-10-09: 160 mg via INTRAVENOUS

## 2017-10-09 MED ORDER — ACETAMINOPHEN 500 MG PO TABS
ORAL_TABLET | ORAL | Status: AC
  Filled 2017-10-09: qty 2

## 2017-10-09 MED ORDER — EPHEDRINE SULFATE 50 MG/ML IJ SOLN
INTRAMUSCULAR | Status: DC | PRN
  Administered 2017-10-09 (×2): 10 mg via INTRAVENOUS

## 2017-10-09 MED ORDER — MIDAZOLAM HCL 2 MG/2ML IJ SOLN
INTRAMUSCULAR | Status: AC
  Filled 2017-10-09: qty 2

## 2017-10-09 MED ORDER — MIDAZOLAM HCL 2 MG/2ML IJ SOLN
INTRAMUSCULAR | Status: DC | PRN
  Administered 2017-10-09: 2 mg via INTRAVENOUS

## 2017-10-09 SURGICAL SUPPLY — 49 items
BAG DECANTER FOR FLEXI CONT (MISCELLANEOUS) ×3 IMPLANT
BLADE CLIPPER SURG (BLADE) IMPLANT
BLADE SURG 11 STRL SS (BLADE) ×3 IMPLANT
BLADE SURG 15 STRL LF DISP TIS (BLADE) ×1 IMPLANT
BLADE SURG 15 STRL SS (BLADE) ×2
CANISTER SUCT 3000ML PPV (MISCELLANEOUS) IMPLANT
CHLORAPREP W/TINT 26ML (MISCELLANEOUS) ×3 IMPLANT
COVER SURGICAL LIGHT HANDLE (MISCELLANEOUS) ×3 IMPLANT
COVER TRANSDUCER ULTRASND GEL (DRAPE) ×3 IMPLANT
CRADLE DONUT ADULT HEAD (MISCELLANEOUS) ×3 IMPLANT
DERMABOND ADVANCED (GAUZE/BANDAGES/DRESSINGS) ×2
DERMABOND ADVANCED .7 DNX12 (GAUZE/BANDAGES/DRESSINGS) ×1 IMPLANT
DRAPE C-ARM 42X72 X-RAY (DRAPES) ×3 IMPLANT
DRAPE LAPAROSCOPIC ABDOMINAL (DRAPES) ×3 IMPLANT
DRAPE UTILITY XL STRL (DRAPES) ×6 IMPLANT
ELECT CAUTERY BLADE 6.4 (BLADE) ×3 IMPLANT
ELECT REM PT RETURN 9FT ADLT (ELECTROSURGICAL) ×3
ELECTRODE REM PT RTRN 9FT ADLT (ELECTROSURGICAL) ×1 IMPLANT
GAUZE SPONGE 4X4 16PLY XRAY LF (GAUZE/BANDAGES/DRESSINGS) ×3 IMPLANT
GLOVE EUDERMIC 7 POWDERFREE (GLOVE) ×3 IMPLANT
GOWN STRL REUS W/ TWL LRG LVL3 (GOWN DISPOSABLE) ×1 IMPLANT
GOWN STRL REUS W/ TWL XL LVL3 (GOWN DISPOSABLE) ×1 IMPLANT
GOWN STRL REUS W/TWL LRG LVL3 (GOWN DISPOSABLE) ×2
GOWN STRL REUS W/TWL XL LVL3 (GOWN DISPOSABLE) ×2
INTRODUCER 13FR (MISCELLANEOUS) IMPLANT
INTRODUCER COOK 11FR (CATHETERS) IMPLANT
KIT BASIN OR (CUSTOM PROCEDURE TRAY) ×3 IMPLANT
KIT PORT POWER 8FR ISP CVUE (Miscellaneous) ×3 IMPLANT
KIT ROOM TURNOVER OR (KITS) ×3 IMPLANT
NEEDLE HYPO 25GX1X1/2 BEV (NEEDLE) ×3 IMPLANT
NS IRRIG 1000ML POUR BTL (IV SOLUTION) ×3 IMPLANT
PACK SURGICAL SETUP 50X90 (CUSTOM PROCEDURE TRAY) ×3 IMPLANT
PAD ARMBOARD 7.5X6 YLW CONV (MISCELLANEOUS) ×3 IMPLANT
PENCIL BUTTON HOLSTER BLD 10FT (ELECTRODE) ×3 IMPLANT
SET INTRODUCER 12FR PACEMAKER (SHEATH) IMPLANT
SET SHEATH INTRODUCER 10FR (MISCELLANEOUS) IMPLANT
SHEATH COOK PEEL AWAY SET 9F (SHEATH) IMPLANT
SURGILUBE 3G PEEL PACK STRL (MISCELLANEOUS) ×3 IMPLANT
SUT MNCRL AB 4-0 PS2 18 (SUTURE) ×3 IMPLANT
SUT PROLENE 2 0 CT2 30 (SUTURE) ×3 IMPLANT
SUT VIC AB 3-0 SH 18 (SUTURE) ×3 IMPLANT
SYR 10ML LL (SYRINGE) ×6 IMPLANT
SYR 5ML LUER SLIP (SYRINGE) ×3 IMPLANT
SYR CONTROL 10ML LL (SYRINGE) ×3 IMPLANT
TOWEL OR 17X24 6PK STRL BLUE (TOWEL DISPOSABLE) ×3 IMPLANT
TOWEL OR 17X26 10 PK STRL BLUE (TOWEL DISPOSABLE) ×3 IMPLANT
TUBE CONNECTING 12'X1/4 (SUCTIONS)
TUBE CONNECTING 12X1/4 (SUCTIONS) IMPLANT
YANKAUER SUCT BULB TIP NO VENT (SUCTIONS) IMPLANT

## 2017-10-09 NOTE — Discharge Instructions (Signed)
Since you do not like to take narcotic pain medicine, use Tylenol 1000 mg every 6 hours as needed for pain. That will probably be more than adequate           PORT-A-CATH: POST OP INSTRUCTIONS  Always review your discharge instruction sheet given to you by the facility where your surgery was performed.   1. A prescription for pain medication may be given to you upon discharge. Take your pain medication as prescribed, if needed. If narcotic pain medicine is not needed, then you make take acetaminophen (Tylenol) or ibuprofen (Advil) as needed.  2. Take your usually prescribed medications unless otherwise directed. 3. If you need a refill on your pain medication, please contact our office. All narcotic pain medicine now requires a paper prescription.  Phoned in and fax refills are no longer allowed by law.  Prescriptions will not be filled after 5 pm or on weekends.  4. You should follow a light diet for the remainder of the day after your procedure. 5. Most patients will experience some mild swelling and/or bruising in the area of the incision. It may take several days to resolve. 6. It is common to experience some constipation if taking pain medication after surgery. Increasing fluid intake and taking a stool softener (such as Colace) will usually help or prevent this problem from occurring. A mild laxative (Milk of Magnesia or Miralax) should be taken according to package directions if there are no bowel movements after 48 hours.  7. Unless discharge instructions indicate otherwise, you may remove your bandages 48 hours after surgery, and you may shower at that time. You may have steri-strips (small white skin tapes) in place directly over the incision.  These strips should be left on the skin for 7-10 days.  If your surgeon used Dermabond (skin glue) on the incision, you may shower in 24 hours.  The glue will flake off over the next 2-3 weeks.  8. If your port is left accessed at the end of  surgery (needle left in port), the dressing cannot get wet and should only by changed by a healthcare professional. When the port is no longer accessed (when the needle has been removed), follow step 7.   9. ACTIVITIES:  Limit activity involving your arms for the next 72 hours. Do no strenuous exercise or activity for 1 week. You may drive when you are no longer taking prescription pain medication, you can comfortably wear a seatbelt, and you can maneuver your car. 10.You may need to see your doctor in the office for a follow-up appointment.  Please       check with your doctor.  11.When you receive a new Port-a-Cath, you will get a product guide and        ID card.  Please keep them in case you need them.  WHEN TO CALL YOUR DOCTOR [phone]): 1. Fever over 101.0 2. Chills 3. Continued bleeding from incision 4. Increased redness and tenderness at the site 5. Shortness of breath, difficulty breathing   The clinic staff is available to answer your questions during regular business hours. Please dont hesitate to call and ask to speak to one of the nurses or medical assistants for clinical concerns. If you have a medical emergency, go to the nearest emergency room or call 911.  A surgeon from [facility] is always on call at the hospital.     For further information, please visit [email or web address]

## 2017-10-09 NOTE — Anesthesia Postprocedure Evaluation (Signed)
Anesthesia Post Note  Patient: Charlene Rubio  Procedure(s) Performed: INSERTION PORT-A-CATH WITH ULTRASOUND (Right Chest)     Patient location during evaluation: PACU Anesthesia Type: General Level of consciousness: awake Pain management: pain level controlled Vital Signs Assessment: post-procedure vital signs reviewed and stable Respiratory status: spontaneous breathing Cardiovascular status: stable Anesthetic complications: no    Last Vitals:  Vitals:   10/09/17 1642 10/09/17 1645  BP:  (!) 163/66  Pulse: 70 71  Resp: (!) 22 (!) 27  Temp: 36.4 C   SpO2: 100% 100%    Last Pain:  Vitals:   10/09/17 1642  TempSrc:   PainSc: 0-No pain                 [name]

## 2017-10-09 NOTE — Interval H&P Note (Signed)
History and Physical Interval Note:  10/09/2017 3:31 PM  Charlene Rubio  has presented today for surgery, with the diagnosis of Breast cancer  The various methods of treatment have been discussed with the patient and family. After consideration of risks, benefits and other options for treatment, the patient has consented to  Procedure(s): INSERTION PORT-A-CATH WITH ULTRASOUND (N/A) as a surgical intervention .  The patient's history has been reviewed, patient examined, no change in status, stable for surgery.  I have reviewed the patient's chart and labs.  Questions were answered to the patient's satisfaction.     Adin Hector

## 2017-10-09 NOTE — Anesthesia Procedure Notes (Signed)
Procedure Name: LMA Insertion Date/Time: 10/09/2017 3:55 PM Performed by: Leonor Liv, CRNA Pre-anesthesia Checklist: Patient identified, Emergency Drugs available, Suction available and Patient being monitored Patient Re-evaluated:Patient Re-evaluated prior to induction Oxygen Delivery Method: Circle System Utilized Preoxygenation: Pre-oxygenation with 100% oxygen Induction Type: IV induction Ventilation: Mask ventilation without difficulty LMA: LMA inserted LMA Size: 4.0 Number of attempts: 1 Placement Confirmation: positive ETCO2 Tube secured with: Tape Dental Injury: Teeth and Oropharynx as per pre-operative assessment

## 2017-10-09 NOTE — Op Note (Addendum)
Patient Name:           Charlene Rubio   Date of Surgery:        10/09/2017  Pre op Diagnosis:      Breast cancer  Post op Diagnosis:    Bilateral breast cancer  Procedure:                 Insertion of 8 French tunneled power port ClearVue venous vascular access device                                      Use of ultrasound to guide venipuncture                                       Use of fluoroscopy for guidance and positioning  Surgeon:                     Edsel Petrin. Dalbert Batman, M.D., FACS  Assistant:                      OR staff  Operative Indications:   . This is a 72 year old female who returns for a postop visit following bilateral mastectomy and to discuss Port-A-Cath insertion which is scheduled for 10/09/2017.Marland Kitchen Her husband is with her throughout the encounter. [name] is her oncologist. Dr. [name] is her PCP. Dr. Georgina Peer is her cardiologist. [name] is her neurologist      On the right side she had a 1.7 cm invasive carcinoma, receptor positive, HER-2 negative, negative nodes. Stage TI cN0.    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 Negative margins. ER and PR negative. HER-2 was detected.      She has seen [name]. Her cardiac evaluation was surprisingly favorable and so he has requested that I put a Port-A-Cath in which is scheduled for next week. I spent a long time discussing the indications details techniques and risks of Port-A-Cath insertion with the patient and her husband. They agree with this plan      I told the patient and her husband that it was important to discontinue the Eliquis blood thinner 3 days preop. This was also presented in their discharge instructions    Operative Findings:       Right subclavian venipuncture was unsuccessful.  Right internal jugular venipuncture with ultrasound guidance was successful without difficulty.  The catheter tip is at the right  atrial junction.  Is no deformity of the catheter.  The catheter flushed easily and had excellent blood return.  Procedure in Detail:          Following the induction of LMA general anesthesia the patient was positioned with a small roll behind her shoulders and her arms tucked at her sides.  The neck and chest were prepped and draped in a sterile fashion.  Intravenous antibiotics were given.  Surgical timeout was performed.  0.5% Marcaine with epinephrine was used as a local infiltration anesthetic. A right subclavian venipuncture was attempted but I did not get any blood return after 3 passes and so I abandoned this approach.  Using ultrasound with guidance I identified the internal jugular vein and carotid artery.  I performed  a right internal jugular venipuncture with excellent blood return and threaded the catheter into the right atrium without difficulty.  A transverse incision was made at the wire insertion site.          A transverse incision was made below the mid portion of the right clavicle.  Subcutaneous pocket was created.  Using a tunneling device I passed the catheter from the wire insertion site in the neck to the port pocket site in the infraclavicular area.  Fluoroscopy was used frequently and I used the C-arm to draw a template on the chest wall to guide catheter length and positioning.  Using this template I cut the catheter 26 cm in length.  The catheter was then secured to the port with the locking device and the port and catheter flushed with heparinized saline.  The port was sutured to the pectoralis fascia with 3 interrupted sutures of 2-0 Prolene.  The dilator and peel-away sheath was inserted over the guidewire into the central venous circulation.  Wire and dilator were removed, the catheter threaded and peel-away sheath removed.  I had excellent blood return and the catheter flushed easily.  Fluoroscopy was performed and showed no deformity of the catheter anywhere and the tip of the  catheter appeared to be at the right atrial junction.  The port and catheter were then flushed with concentrated heparin.  Subcutaneous tissue was closed with 3-0 Vicryl sutures and the skin incisions closed with subcuticular 4-0 Monocryl and Dermabond.  The patient tolerated the procedure well was taken to PACU in stable condition.  Chest x-ray is planned.  There were no complications. Sponge needle initially counts were correct   Addendum: I logged on to the [employer] and reviewed her prescription medication history.       Edsel Petrin. Dalbert Batman, M.D., FACS General and Minimally Invasive Surgery Breast and Colorectal Surgery  10/09/2017 4:45 PM

## 2017-10-09 NOTE — Transfer of Care (Signed)
Immediate Anesthesia Transfer of Care Note  Patient: Charlene Rubio  Procedure(s) Performed: INSERTION PORT-A-CATH WITH ULTRASOUND (Right Chest)  Patient Location: PACU  Anesthesia Type:General  Level of Consciousness: drowsy  Airway & Oxygen Therapy: Patient Spontanous Breathing and Patient connected to nasal cannula oxygen  Post-op Assessment: Report given to RN and Post -op Vital signs reviewed and stable  Post vital signs: Reviewed and stable  Last Vitals:  Vitals:   10/09/17 1425 10/09/17 1642  BP: (!) 179/71   Pulse: 68 70  Resp: 18 (!) 22  Temp: 36.8 C 36.4 C  SpO2: 99% 100%    Last Pain:  Vitals:   10/09/17 1425  TempSrc: Oral         Complications: No apparent anesthesia complications

## 2017-10-09 NOTE — Anesthesia Preprocedure Evaluation (Addendum)
Anesthesia Evaluation  Patient identified by MRN, date of birth, ID band Patient awake    Reviewed: Allergy & Precautions, NPO status , Patient's Chart, lab work & pertinent test results, reviewed documented beta blocker date and time   Airway Mallampati: II  TM Distance: >3 FB Neck ROM: Full    Dental no notable dental hx.    Pulmonary neg pulmonary ROS,    Pulmonary exam normal breath sounds clear to auscultation       Cardiovascular hypertension, Pt. on home beta blockers and Pt. on medications + Peripheral Vascular Disease, +CHF and + DOE  Normal cardiovascular exam+ Valvular Problems/Murmurs AI, MR and AS  Rhythm:Regular Rate:Normal  ECG: NSR, rate 61  Sees cardiologist   ECHO:  Normal LV size with EF 50%, diffuse mild hypokinesis. Normal RV size with mildly decreased systolic function. Trileaflet aortic valve with mild stenosis and moderate regurgitation. Mild to moderate MR.   Neuro/Psych PSYCHIATRIC DISORDERS Anxiety Dementia negative neurological ROS     GI/Hepatic negative GI ROS, Neg liver ROS,   Endo/Other  diabetes, Oral Hypoglycemic Agents  Renal/GU negative Renal ROS     Musculoskeletal negative musculoskeletal ROS (+)   Abdominal   Peds  Hematology negative hematology ROS (+)   Anesthesia Other Findings Breast cancer  Reproductive/Obstetrics                          Anesthesia Physical Anesthesia Plan  ASA: III  Anesthesia Plan: General   Post-op Pain Management:    Induction: Intravenous  PONV Risk Score and Plan: 3 and Ondansetron, Dexamethasone and Treatment may vary due to age or medical condition  Airway Management Planned: LMA  Additional Equipment:   Intra-op Plan:   Post-operative Plan: Extubation in OR  Informed Consent: I have reviewed the patients History and Physical, chart, labs and discussed the procedure including the risks, benefits and  alternatives for the proposed anesthesia with the patient or authorized representative who has indicated his/her understanding and acceptance.   Dental advisory given  Plan Discussed with: CRNA  Anesthesia Plan Comments:         Anesthesia Quick Evaluation

## 2017-10-10 ENCOUNTER — Ambulatory Visit
Admission: RE | Admit: 2017-10-10 | Discharge: 2017-10-10 | Disposition: A | Discharge status: Home / Self Care | Payer: Medicare Other | Source: Ambulatory Visit | Attending: Radiation Oncology | Admitting: Radiation Oncology

## 2017-10-10 ENCOUNTER — Encounter: Payer: Self-pay | Admitting: Radiation Oncology

## 2017-10-10 VITALS — BP 152/55 | HR 69 | Temp 98.5°F | Ht 66.0 in | Wt 159.0 lb

## 2017-10-10 DIAGNOSIS — E119 Type 2 diabetes mellitus without complications: Secondary | ICD-10-CM | POA: Diagnosis not present

## 2017-10-10 DIAGNOSIS — C50212 Malignant neoplasm of upper-inner quadrant of left female breast: Secondary | ICD-10-CM

## 2017-10-10 DIAGNOSIS — I700 Atherosclerosis of aorta: Secondary | ICD-10-CM | POA: Insufficient documentation

## 2017-10-10 DIAGNOSIS — I110 Hypertensive heart disease with heart failure: Secondary | ICD-10-CM | POA: Insufficient documentation

## 2017-10-10 DIAGNOSIS — I429 Cardiomyopathy, unspecified: Secondary | ICD-10-CM | POA: Diagnosis not present

## 2017-10-10 DIAGNOSIS — I351 Nonrheumatic aortic (valve) insufficiency: Secondary | ICD-10-CM | POA: Insufficient documentation

## 2017-10-10 DIAGNOSIS — Z170 Estrogen receptor positive status [ER+]: Secondary | ICD-10-CM | POA: Diagnosis not present

## 2017-10-10 DIAGNOSIS — C773 Secondary and unspecified malignant neoplasm of axilla and upper limb lymph nodes: Secondary | ICD-10-CM | POA: Insufficient documentation

## 2017-10-10 DIAGNOSIS — Z7984 Long term (current) use of oral hypoglycemic drugs: Secondary | ICD-10-CM | POA: Insufficient documentation

## 2017-10-10 DIAGNOSIS — Z7982 Long term (current) use of aspirin: Secondary | ICD-10-CM | POA: Insufficient documentation

## 2017-10-10 DIAGNOSIS — E669 Obesity, unspecified: Secondary | ICD-10-CM | POA: Insufficient documentation

## 2017-10-10 DIAGNOSIS — M129 Arthropathy, unspecified: Secondary | ICD-10-CM | POA: Insufficient documentation

## 2017-10-10 DIAGNOSIS — Z9013 Acquired absence of bilateral breasts and nipples: Secondary | ICD-10-CM | POA: Diagnosis not present

## 2017-10-10 DIAGNOSIS — F0390 Unspecified dementia without behavioral disturbance: Secondary | ICD-10-CM | POA: Insufficient documentation

## 2017-10-10 DIAGNOSIS — E079 Disorder of thyroid, unspecified: Secondary | ICD-10-CM | POA: Insufficient documentation

## 2017-10-10 DIAGNOSIS — I890 Lymphedema, not elsewhere classified: Secondary | ICD-10-CM | POA: Diagnosis not present

## 2017-10-10 DIAGNOSIS — Z79899 Other long term (current) drug therapy: Secondary | ICD-10-CM | POA: Diagnosis not present

## 2017-10-10 DIAGNOSIS — C50411 Malignant neoplasm of upper-outer quadrant of right female breast: Secondary | ICD-10-CM | POA: Insufficient documentation

## 2017-10-10 DIAGNOSIS — I739 Peripheral vascular disease, unspecified: Secondary | ICD-10-CM | POA: Insufficient documentation

## 2017-10-10 DIAGNOSIS — I509 Heart failure, unspecified: Secondary | ICD-10-CM | POA: Diagnosis not present

## 2017-10-10 NOTE — Progress Notes (Signed)
Radiation Oncology         [phone] ________________________________  Initial Outpatient Consultation  Name: Charlene Rubio MRN: [id number]  Date: 10/10/2017  DOB: Dec 29, 1945  WU:[id number], Brayton Layman, DO  Nicholas Lose, MD   REFERRING PHYSICIAN: Nicholas Lose, MD  DIAGNOSIS:    ICD-10-CM   1. 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 female who was noted to have a lump on her left breast while being hospitalized for acute diastolic congestive heart failure in September 2018. On physical examination the lump was about 6 cm in diameter. The patient's husband, who is very involved in her care, denied proceeding with biopsy at the time. She then presented for a bilateral diagnostic mammogram and ultrasound on 06/13/2017 which showed the palpable mass in the retroareolar left breast, measuring 6.3 cm, as well as two abnormal left axillary lymph nodes. There was also a highly suspicious 1.7 cm mass in the right breast at 9:30 but no evidence of right axillary lymphadenopathy. Biopsy of the right and left masses on 06/23/2017 showed invasive ductal carcinoma with characteristics as described above in the diagnosis. A biopsy  of the left axillary lymph node also revealed metastatic mammary carcinoma.   She underwent staging scans in December 2018 including a CT of the chest/abdomen/pelvis, a bone scan, and a brain MRI. She had a 5 mm right lower lobe lung nodule that appeared indeterminate. No obvious metastatic disease. Bone scan was negative for metastatic disease as well as her brain MRI.   She subsequently underwent bilateral mastectomies on 08/27/2017 by Dr. Dalbert Batman. Pathology for the right breast revealed grade 1 invasive ductal carcinoma, spanning 1.7 cm, with low grade DCIS. Her resection margins and right axillary lymph nodes were negative. Pathology for the left breast revealed invasive grade 3 invasive ductal carcinoma, spanning 6.2 cm. These resection margins were negative by at least 38m, but there was metastatic carcinoma in 4 of 13 left axillary lymph nodes.   Dr. GLindi Adiedoes not feel she is a good candidate for systemic chemotherapy, so she will receive adjuvant chemotherapy with Herceptin. She has been referred today for discussion of adjuvant radiation treatment and will begin antiestrogen therapy after radiation. She is accompanied by her husband and daughter-in-law.  On review of systems, the patient reports pain to her right neck from sleeping on it wrong. She also her port-a-cath placement yesterday. She reports chronic lymphedema issues in her lower extremities, more-so in the left. She states that she completed one session of physical therapy following surgery but has not gone for any more sessions. She does have early dementia, and her husband and daughter-in-law often speak on her behalf.  PREVIOUS RADIATION THERAPY: No  PAST MEDICAL HISTORY:  has a past  medical history of Acute diastolic (congestive) heart failure ([place]) (04/25/2017), Acute respiratory failure with hypoxia and hypercapnia ([place]) (04/25/2017), ALLERGIC RHINITIS (07/01/2007), Allergy, Aortic insufficiency, Aortic valve disorder (07/01/2007),  Arthritis, Bilateral leg edema (04/25/2017), Borderline diabetic, Cancer ([place]), CHF (congestive heart failure) ([place]) (04/25/2017), Dementia (04/30/2017), Diabetes mellitus ([place]) (04/25/2017), DOE (dyspnea on exertion) (04/25/2017), Essential hypertension (07/01/2007), Glaucoma suspect, Hypertension, KNEE PAIN, LEFT (04/06/2010), Nonischemic cardiomyopathy ([place]), Obesity, Obesity, unspecified (07/01/2007), Peripheral vascular disease ([place]), and Thyroid disease.    PAST SURGICAL HISTORY: Past Surgical History:  Procedure Laterality Date  . CESAREAN SECTION    . MASTECTOMY  08/27/2017   LEFT BREAST MODIFIED RADICAL MASTECTOMY (Left Breast)  . MASTECTOMY  08/27/2017   RIGHT TOTAL MASTECTOMY WITH SENTINEL LYMPH NODE BIOPSY (Right Breast)  . MASTECTOMY W/ SENTINEL NODE BIOPSY Right 08/27/2017   Procedure: RIGHT TOTAL MASTECTOMY WITH SENTINEL LYMPH NODE BIOPSY;  Surgeon: Fanny Skates, MD;  Location: [facility];  Service: General;  Laterality: Right;  . MODIFIED MASTECTOMY Left 08/27/2017   Procedure: LEFT BREAST MODIFIED RADICAL MASTECTOMY;  Surgeon: Fanny Skates, MD;  Location: [facility];  Service: General;  Laterality: Left;  . PORTACATH PLACEMENT Right 10/09/2017    FAMILY HISTORY: family history includes Alcoholism in her brother and brother; Alzheimer's disease in her brother; Alzheimer's disease (age of onset: 34) in her father and mother; Congestive Heart Failure in her father; Dementia in her brother; Diabetes in her brother.  SOCIAL HISTORY:  reports that  has never smoked. she has never used smokeless tobacco. She reports that she does not drink alcohol or use drugs.  ALLERGIES: Patient has no known allergies.  MEDICATIONS:  Current Outpatient Medications  Medication Sig Dispense Refill  . apixaban (ELIQUIS) 5 MG TABS tablet Take 1 tablet (5 mg total) by mouth 2 (two) times daily. 60 tablet 5  . aspirin EC 81 MG EC tablet Take 1 tablet (81 mg total) by mouth daily.    . carvedilol (COREG) 6.25 MG  tablet Take 1 tablet (6.25 mg total) by mouth 2 (two) times daily with a meal. 180 tablet 3  . donepezil (ARICEPT) 10 MG tablet Take 1 tablet (10 mg total) by mouth at bedtime. 30 tablet 6  . furosemide (LASIX) 40 MG tablet Take 1.5 tablets (60 mg total) by mouth daily. 145 tablet 3  . isosorbide mononitrate (IMDUR) 30 MG 24 hr tablet Take 0.5 tablets (15 mg total) by mouth daily. 45 tablet 3  . letrozole (FEMARA) 2.5 MG tablet Take 1 tablet (2.5 mg total) by mouth daily. 90 tablet 3  . lidocaine-prilocaine (EMLA) cream Apply to affected area once 30 g 3  . lisinopril (PRINIVIL,ZESTRIL) 20 MG tablet Take 1 tablet (20 mg total) by mouth daily. 90 tablet 3  . metFORMIN (GLUCOPHAGE-XR) 500 MG 24 hr tablet Take 1 tablet (500 mg total) by mouth 2 (two) times daily with a meal. 180 tablet 1  . Multiple Vitamins-Minerals (ADULT GUMMY PO) Take 1 tablet by mouth 3 (three) times a week.     No current facility-administered medications for this encounter.     REVIEW OF SYSTEMS: as above.    PHYSICAL EXAM:  height is '5\' 6"'  (1.676 m) and weight is 159 lb (72.1 kg). Her temperature is 98.5 F (36.9 C). Her blood pressure is 152/55 (abnormal) and her pulse is 69. Her oxygen saturation is 100%.   General:  in no acute distress. HEENT: Head is normocephalic. Extraocular movements are intact. Oropharynx is clear. Neck: Neck is supple, no palpable  cervical or supraclavicular lymphadenopathy. She has a healing scar in the right lower neck from port-a-cath insertion. Heart: Regular in rate and rhythm. She has a systolic murmur that is loudest at the aortic region. Vascular: New port-a-cath in the right upper chest. Chest: Clear to auscultation bilaterally, with no rhonchi, wheezes, or rales. Abdomen: Soft, nontender, nondistended, with no rigidity or guarding. Extremities: She has pitting lymphedema in her lower extremities, more-so on the left. No lymphedema in her upper extremities.  Lymphatics: see Neck  Exam Skin: erythema, left lower extremity, consistent with vascular insufficiency. Musculoskeletal: Symmetric strength and muscle tone throughout. Good ROM in upper extremities. Neurologic: Cranial nerves II through XII are grossly intact. No obvious focalities. Speech is fluent. Coordination is intact. Finger-to-nose testing intact. Psychiatric: Judgment and insight are intact. Affect is appropriate. Breasts: She has bilateral mastectomy scars that are healing well. No axillary scars. No sign of recurrence over the chest.   ECOG = 3  0 - Asymptomatic (Fully active, able to carry on all predisease activities without restriction)  1 - Symptomatic but completely ambulatory (Restricted in physically strenuous activity but ambulatory and able to carry out work of a light or sedentary nature. For example, light housework, office work)  2 - Symptomatic, <50% in bed during the day (Ambulatory and capable of all self care but unable to carry out any work activities. Up and about more than 50% of waking hours)  3 - Symptomatic, >50% in bed, but not bedbound (Capable of only limited self-care, confined to bed or chair 50% or more of waking hours)  4 - Bedbound (Completely disabled. Cannot carry on any self-care. Totally confined to bed or chair)  5 - Death   Eustace Pen MM, Creech RH, Tormey DC, et al. [phone]). "Toxicity and response criteria of the [facility]". Kevin Oncol. 5 (6): 649-55   LABORATORY DATA:  Lab Results  Component Value Date   WBC 5.1 10/09/2017   HGB 13.3 10/09/2017   HCT 39.6 10/09/2017   MCV 95.0 10/09/2017   PLT 215 10/09/2017   CMP     Component Value Date/Time   NA 141 10/09/2017 1346   NA 143 07/18/2017 1147   K 3.9 10/09/2017 1346   K 3.7 07/18/2017 1147   CL 109 10/09/2017 1346   CO2 21 (L) 10/09/2017 1346   CO2 25 07/18/2017 1147   GLUCOSE 119 (H) 10/09/2017 1346   GLUCOSE 120 07/18/2017 1147   BUN 14 10/09/2017 1346   BUN 13.3  07/18/2017 1147   CREATININE 0.53 10/09/2017 1346   CREATININE 0.66 09/12/2017 1246   CREATININE 0.7 07/18/2017 1147   CALCIUM 9.1 10/09/2017 1346   CALCIUM 8.9 07/18/2017 1147   PROT 6.7 08/25/2017 0959   ALBUMIN 3.8 08/25/2017 0959   AST 19 08/25/2017 0959   ALT 14 08/25/2017 0959   ALKPHOS 71 08/25/2017 0959   BILITOT 1.0 08/25/2017 0959   GFRNONAA >60 10/09/2017 1346   GFRNONAA 88 09/12/2017 1246   GFRAA >60 10/09/2017 1346   GFRAA 102 09/12/2017 1246         RADIOGRAPHY: Dg Chest Port 1 View  Result Date: 10/09/2017 CLINICAL DATA:  61 y/o  F; Port-A-Cath in place. EXAM: PORTABLE CHEST 1 VIEW COMPARISON:  04/28/2017 chest radiograph. FINDINGS: Stable cardiomegaly given projection and technique. Aortic atherosclerosis with calcification. Right port catheter tip projects over cavoatrial junction. Surgical clips project over bilateral axilla. No focal consolidation. No pleural effusion or pneumothorax. No acute osseous abnormality  is evident. IMPRESSION: Right port catheter tip projects over cavoatrial junction. No acute pulmonary process identified. Electronically Signed   By: Kristine Garbe M.D.   On: 10/09/2017 17:24   Dg Fluoro Guide Cv Line-no Report  Result Date: 10/09/2017 Fluoroscopy was utilized by the requesting physician.  No radiographic interpretation.      IMPRESSION/PLAN: Stage IA, pT1c pN0 pMX Right Breast UOQ Invasive Ductal Carcinoma, ER(+) / PR(+) / Her2(-), Grade 1 with Low Grade DCIS, and Stage IIIA, pT3 pN2a pMX Left Breast UIQ Invasive Ductal Carcinoma, ER(weak 5%+) / PR(-) / Her2(+), Grade 3  It was a pleasure meeting the patient today. We discussed the risks, benefits, and side effects of radiotherapy. I recommend radiotherapy to the left chest wall, left axilla, and left supraclavicular region to reduce her risk of locoregional recurrence by 2/3. I may treat IM nodes depending on heart position.  We discussed that radiation would take approximately  6 weeks to complete. We spoke about acute effects including skin irritation and fatigue as well as much less common late effects including internal organ injury or irritation. We spoke about the latest technology that is used to minimize the risk of late effects for patients undergoing radiotherapy to the breast or chest wall. No guarantees of treatment were given. The patient is enthusiastic about proceeding with treatment. I look forward to participating in the patient's care.  Her CT simulation/treatment planning will be scheduled for next week. She will be called by our staff with this appointment. The patient's husband signed the radiation oncology consent form, and a copy was retained for our records.  I spent 45 minutes face to face with the patient and more than 50% of that time was spent in counseling and/or coordination of care.   __________________________________________   Eppie Gibson, MD  This document serves as a record of services personally performed by Eppie Gibson, MD. It was created on her behalf by Rae Lips, a trained medical scribe. The creation of this record is based on the scribe's personal observations and the provider's statements to them. This document has been checked and approved by the attending provider.

## 2017-10-13 ENCOUNTER — Other Ambulatory Visit: Payer: Self-pay

## 2017-10-13 DIAGNOSIS — C50911 Malignant neoplasm of unspecified site of right female breast: Secondary | ICD-10-CM

## 2017-10-13 DIAGNOSIS — C50912 Malignant neoplasm of unspecified site of left female breast: Principal | ICD-10-CM

## 2017-10-14 ENCOUNTER — Other Ambulatory Visit: Payer: Medicare Other

## 2017-10-14 ENCOUNTER — Inpatient Hospital Stay: Payer: Medicare Other

## 2017-10-14 ENCOUNTER — Ambulatory Visit: Payer: Medicare Other

## 2017-10-14 ENCOUNTER — Ambulatory Visit: Payer: Self-pay | Admitting: Radiation Oncology

## 2017-10-14 ENCOUNTER — Ambulatory Visit: Payer: Medicare Other | Admitting: Hematology and Oncology

## 2017-10-14 VITALS — BP 164/54 | HR 53 | Temp 98.5°F | Resp 18

## 2017-10-14 DIAGNOSIS — C50911 Malignant neoplasm of unspecified site of right female breast: Secondary | ICD-10-CM

## 2017-10-14 DIAGNOSIS — C50811 Malignant neoplasm of overlapping sites of right female breast: Secondary | ICD-10-CM | POA: Diagnosis not present

## 2017-10-14 DIAGNOSIS — C50912 Malignant neoplasm of unspecified site of left female breast: Principal | ICD-10-CM

## 2017-10-14 DIAGNOSIS — Z5112 Encounter for antineoplastic immunotherapy: Secondary | ICD-10-CM | POA: Diagnosis not present

## 2017-10-14 DIAGNOSIS — Z170 Estrogen receptor positive status [ER+]: Principal | ICD-10-CM

## 2017-10-14 DIAGNOSIS — C50812 Malignant neoplasm of overlapping sites of left female breast: Principal | ICD-10-CM

## 2017-10-14 LAB — CMP (CANCER CENTER ONLY)
ALBUMIN: 3.6 g/dL (ref 3.5–5.0)
ALK PHOS: 74 U/L (ref 40–150)
ALT: 9 U/L (ref 0–55)
ANION GAP: 12 — AB (ref 3–11)
AST: 11 U/L (ref 5–34)
BUN: 16 mg/dL (ref 7–26)
CALCIUM: 9.4 mg/dL (ref 8.4–10.4)
CHLORIDE: 107 mmol/L (ref 98–109)
CO2: 25 mmol/L (ref 22–29)
Creatinine: 0.75 mg/dL (ref 0.60–1.10)
GFR, Est AFR Am: >60 mL/min (ref >60)
GFR, Estimated: >60 mL/min (ref >60)
GLUCOSE: 197 mg/dL — AB (ref 70–140)
Potassium: 3.3 mmol/L — ABNORMAL LOW (ref 3.5–5.1)
SODIUM: 144 mmol/L (ref 136–145)
Total Bilirubin: 0.5 mg/dL (ref 0.2–1.2)
Total Protein: 6.5 g/dL (ref 6.4–8.3)

## 2017-10-14 LAB — CBC WITH DIFFERENTIAL (CANCER CENTER ONLY)
BASOS PCT: 1 %
Basophils Absolute: 0 10*3/uL (ref 0.0–0.1)
EOS ABS: 0.1 10*3/uL (ref 0.0–0.5)
EOS PCT: 3 %
HCT: 37.8 % (ref 34.8–46.6)
Hemoglobin: 12.8 g/dL (ref 11.6–15.9)
Lymphocytes Relative: 20 %
Lymphs Abs: 0.8 10*3/uL — ABNORMAL LOW (ref 0.9–3.3)
MCH: 32.3 pg (ref 25.1–34.0)
MCHC: 33.9 g/dL (ref 31.5–36.0)
MCV: 95.5 fL (ref 79.5–101.0)
Monocytes Absolute: 0.4 10*3/uL (ref 0.1–0.9)
Monocytes Relative: 10 %
Neutro Abs: 2.7 10*3/uL (ref 1.5–6.5)
Neutrophils Relative %: 66 %
PLATELETS: 176 10*3/uL (ref 145–400)
RBC: 3.96 MIL/uL (ref 3.70–5.45)
RDW: 13.4 % (ref 11.2–14.5)
WBC Count: 4 10*3/uL (ref 3.9–10.3)

## 2017-10-14 MED ORDER — HEPARIN SOD (PORK) LOCK FLUSH 100 UNIT/ML IV SOLN
500.0000 [IU] | Freq: Once | INTRAVENOUS | Status: AC | PRN
  Administered 2017-10-14: 500 [IU]
  Filled 2017-10-14: qty 5

## 2017-10-14 MED ORDER — SODIUM CHLORIDE 0.9 % IV SOLN
Freq: Once | INTRAVENOUS | Status: AC
  Administered 2017-10-14: 08:00:00 via INTRAVENOUS

## 2017-10-14 MED ORDER — TRASTUZUMAB CHEMO 150 MG IV SOLR
8.0000 mg/kg | Freq: Once | INTRAVENOUS | Status: AC
  Administered 2017-10-14: 567 mg via INTRAVENOUS
  Filled 2017-10-14: qty 27

## 2017-10-14 MED ORDER — SODIUM CHLORIDE 0.9% FLUSH
10.0000 mL | INTRAVENOUS | Status: DC | PRN
  Administered 2017-10-14: 10 mL
  Filled 2017-10-14: qty 10

## 2017-10-14 MED ORDER — DIPHENHYDRAMINE HCL 25 MG PO CAPS
ORAL_CAPSULE | ORAL | Status: AC
  Filled 2017-10-14: qty 2

## 2017-10-14 MED ORDER — ACETAMINOPHEN 325 MG PO TABS
ORAL_TABLET | ORAL | Status: AC
  Filled 2017-10-14: qty 2

## 2017-10-14 MED ORDER — DIPHENHYDRAMINE HCL 25 MG PO CAPS
50.0000 mg | ORAL_CAPSULE | Freq: Once | ORAL | Status: AC
  Administered 2017-10-14: 50 mg via ORAL

## 2017-10-14 MED ORDER — ACETAMINOPHEN 325 MG PO TABS
650.0000 mg | ORAL_TABLET | Freq: Once | ORAL | Status: AC
  Administered 2017-10-14: 650 mg via ORAL

## 2017-10-14 NOTE — Progress Notes (Signed)
Ok to treat with todays blood pressure per [name]. Infusion RN aware.  Cyndia Bent RN

## 2017-10-14 NOTE — Patient Instructions (Signed)
[facility] Discharge Instructions for Patients Receiving Chemotherapy  Today you received the following chemotherapy agents Herceptin  To help prevent nausea and vomiting after your treatment, we encourage you to take your nausea medication as directed If you develop nausea and vomiting that is not controlled by your nausea medication, call the clinic.   BELOW ARE SYMPTOMS THAT SHOULD BE REPORTED IMMEDIATELY:  *FEVER GREATER THAN 100.5 F  *CHILLS WITH OR WITHOUT FEVER  NAUSEA AND VOMITING THAT IS NOT CONTROLLED WITH YOUR NAUSEA MEDICATION  *UNUSUAL SHORTNESS OF BREATH  *UNUSUAL BRUISING OR BLEEDING  TENDERNESS IN MOUTH AND THROAT WITH OR WITHOUT PRESENCE OF ULCERS  *URINARY PROBLEMS  *BOWEL PROBLEMS  UNUSUAL RASH Items with * indicate a potential emergency and should be followed up as soon as possible.  Feel free to call the clinic should you have any questions or concerns. The clinic phone number is [phone].  Please show the [facility] at check-in to the Emergency Department and triage nurse.   Trastuzumab (Herceptin) injection for infusion What is this medicine? TRASTUZUMAB (tras TOO zoo mab) is a monoclonal antibody. It is used to treat breast cancer and stomach cancer. This medicine may be used for other purposes; ask your health care provider or pharmacist if you have questions. COMMON BRAND NAME(S): Herceptin What should I tell my health care provider before I take this medicine? They need to know if you have any of these conditions: -heart disease -heart failure -lung or breathing disease, like asthma -an unusual or allergic reaction to trastuzumab, benzyl alcohol, or other medications, foods, dyes, or preservatives -pregnant or trying to get pregnant -breast-feeding How should I use this medicine? This drug is given as an infusion into a vein. It is administered in a hospital or clinic by a specially trained health care  professional. Talk to your pediatrician regarding the use of this medicine in children. This medicine is not approved for use in children. Overdosage: If you think you have taken too much of this medicine contact a poison control center or emergency room at once. NOTE: This medicine is only for you. Do not share this medicine with others. What if I miss a dose? It is important not to miss a dose. Call your doctor or health care professional if you are unable to keep an appointment. What may interact with this medicine? This medicine may interact with the following medications: -certain types of chemotherapy, such as daunorubicin, doxorubicin, epirubicin, and idarubicin This list may not describe all possible interactions. Give your health care provider a list of all the medicines, herbs, non-prescription drugs, or dietary supplements you use. Also tell them if you smoke, drink alcohol, or use illegal drugs. Some items may interact with your medicine. What should I watch for while using this medicine? Visit your doctor for checks on your progress. Report any side effects. Continue your course of treatment even though you feel ill unless your doctor tells you to stop. Call your doctor or health care professional for advice if you get a fever, chills or sore throat, or other symptoms of a cold or flu. Do not treat yourself. Try to avoid being around people who are sick. You may experience fever, chills and shaking during your first infusion. These effects are usually mild and can be treated with other medicines. Report any side effects during the infusion to your health care professional. Fever and chills usually do not happen with later infusions. Do not become pregnant while  taking this medicine or for 7 months after stopping it. Women should inform their doctor if they wish to become pregnant or think they might be pregnant. Women of child-bearing potential will need to have a negative pregnancy test  before starting this medicine. There is a potential for serious side effects to an unborn child. Talk to your health care professional or pharmacist for more information. Do not breast-feed an infant while taking this medicine or for 7 months after stopping it. Women must use effective birth control with this medicine. What side effects may I notice from receiving this medicine? Side effects that you should report to your doctor or health care professional as soon as possible: -allergic reactions like skin rash, itching or hives, swelling of the face, lips, or tongue -chest pain or palpitations -cough -dizziness -feeling faint or lightheaded, falls -fever -general ill feeling or flu-like symptoms -signs of worsening heart failure like breathing problems; swelling in your legs and feet -unusually weak or tired Side effects that usually do not require medical attention (report to your doctor or health care professional if they continue or are bothersome): -bone pain -changes in taste -diarrhea -joint pain -nausea/vomiting -weight loss This list may not describe all possible side effects. Call your doctor for medical advice about side effects. You may report side effects to FDA at 1-800-FDA-1088. Where should I keep my medicine? This drug is given in a hospital or clinic and will not be stored at home. NOTE: This sheet is a summary. It may not cover all possible information. If you have questions about this medicine, talk to your doctor, pharmacist, or health care provider.  2018 Elsevier/Gold Standard (2016-07-30 14:37:52)

## 2017-10-15 ENCOUNTER — Telehealth: Payer: Self-pay

## 2017-10-15 ENCOUNTER — Ambulatory Visit
Admission: RE | Admit: 2017-10-15 | Discharge: 2017-10-15 | Disposition: A | Discharge status: Home / Self Care | Payer: Medicare Other | Source: Ambulatory Visit | Attending: Radiation Oncology | Admitting: Radiation Oncology

## 2017-10-15 DIAGNOSIS — C50812 Malignant neoplasm of overlapping sites of left female breast: Secondary | ICD-10-CM

## 2017-10-15 DIAGNOSIS — Z170 Estrogen receptor positive status [ER+]: Secondary | ICD-10-CM | POA: Diagnosis not present

## 2017-10-15 DIAGNOSIS — Z510 Encounter for antineoplastic radiation therapy: Secondary | ICD-10-CM | POA: Diagnosis not present

## 2017-10-15 DIAGNOSIS — C50811 Malignant neoplasm of overlapping sites of right female breast: Secondary | ICD-10-CM

## 2017-10-15 DIAGNOSIS — C50212 Malignant neoplasm of upper-inner quadrant of left female breast: Secondary | ICD-10-CM | POA: Insufficient documentation

## 2017-10-15 NOTE — Progress Notes (Signed)
Radiation Oncology         [phone]) [phone] ________________________________  Name: Charlene Rubio MRN: [id number]  Date: 10/15/2017  DOB: 13-Jul-1946  SIMULATION AND TREATMENT PLANNING NOTE / Special treatment procedure     Outpatient  DIAGNOSIS:     ICD-10-CM   1. 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 Malignant neoplasm of upper-inner quadrant of left breast in female, estrogen receptor positive ([place]) C50.212    Z17.0     NARRATIVE:  The patient was brought to the [facility].  Identity was confirmed.  All relevant records and images related to the planned course of therapy were reviewed.  The patient freely provided informed written consent to proceed with treatment after reviewing the details related to the planned course of therapy. The consent form was witnessed and verified by the simulation staff.    Then, the patient was set-up in a stable reproducible supine position for radiation therapy with her ipsilateral arm over her head, and her upper body secured in a custom-made Vac-lok device.  CT images were obtained.  Surface markings were placed.  The CT images were loaded into the planning software.    Special treatment procedure:  Special treatment procedure was performed today due to the extra time and effort required by myself to plan and prepare this patient for deep inspiration breath hold technique.  I have determined cardiac sparing to be of benefit to this patient to prevent long term cardiac damage due to radiation of the heart.  Bellows were placed on the patient's abdomen. To facilitate cardiac sparing, the patient was coached by the radiation therapists on breath hold techniques and breathing practice was performed. Practice waveforms were obtained. The patient was then scanned while maintaining breath hold in the treatment position.  This image was then transferred over  to the imaging specialist. The imaging specialist then created a fusion of the free breathing and breath hold scans using the chest wall as the stable structure. I personally reviewed the fusion in axial, coronal and sagittal image planes.  Excellent cardiac sparing was obtained.  I felt the patient is an appropriate candidate for breath hold and the patient will be treated as such.  The image fusion was then reviewed with the patient to reinforce the necessity of reproducible breath hold.   TREATMENT PLANNING NOTE: Treatment planning then occurred.  The radiation prescription was entered and confirmed.     A total of 5 medically necessary complex treatment devices were fabricated and supervised by me: 4 fields with MLCs for custom blocks to protect heart, and lungs;  and, a Vac-lok. MORE COMPLEX DEVICES MAY BE MADE IN DOSIMETRY FOR FIELD IN FIELD BEAMS FOR DOSE HOMOGENEITY.  I have requested : 3D Simulation which is medically necessary to give adequate dose to at risk tissues while sparing lungs and heart.  I have requested a DVH of the following structures: lungs, heart, esophagus, cord .    The patient will receive 50 Gy in 25 fractions to the left chest wall and regional nodes with 4 fields (PAB, SCV, and IM nodes depending on degree of heart exposure).   This will be followed by a boost.  Optical Surface Tracking Plan:  Since intensity modulated radiotherapy (IMRT) and 3D conformal radiation treatment methods are predicated on accurate and precise positioning for treatment, intrafraction motion monitoring is medically necessary to ensure  accurate and safe treatment delivery. The ability to quantify intrafraction motion without excessive ionizing radiation dose can only be performed with optical surface tracking. Accordingly, surface imaging offers the opportunity to obtain 3D measurements of patient position throughout IMRT and 3D treatments without excessive radiation exposure. I am ordering optical  surface tracking for this patient's upcoming course of radiotherapy.  ________________________________   Reference:  Ursula Alert, J, et al. Surface imaging-based analysis of intrafraction motion for breast radiotherapy patients.Journal of [place], n. 6, nov. 2014. ISSN [id number].  Available at: <http://www.jacmp.org/index.php/jacmp/article/view/4957>.    -----------------------------------  Eppie Gibson, MD

## 2017-10-15 NOTE — Telephone Encounter (Signed)
Left VM for patient regarding first time Herceptin treatment yesterday. Informed pt to call with any questions or concerns.  Cyndia Bent RN

## 2017-10-16 ENCOUNTER — Ambulatory Visit (HOSPITAL_COMMUNITY)
Admission: RE | Admit: 2017-10-16 | Discharge: 2017-10-16 | Disposition: A | Discharge status: Home / Self Care | Payer: Medicare Other | Source: Ambulatory Visit | Attending: Hematology and Oncology | Admitting: Hematology and Oncology

## 2017-10-16 DIAGNOSIS — I509 Heart failure, unspecified: Secondary | ICD-10-CM | POA: Insufficient documentation

## 2017-10-16 DIAGNOSIS — I352 Nonrheumatic aortic (valve) stenosis with insufficiency: Secondary | ICD-10-CM | POA: Diagnosis not present

## 2017-10-16 DIAGNOSIS — E785 Hyperlipidemia, unspecified: Secondary | ICD-10-CM | POA: Diagnosis not present

## 2017-10-16 DIAGNOSIS — Z170 Estrogen receptor positive status [ER+]: Secondary | ICD-10-CM | POA: Insufficient documentation

## 2017-10-16 DIAGNOSIS — C50812 Malignant neoplasm of overlapping sites of left female breast: Secondary | ICD-10-CM | POA: Diagnosis not present

## 2017-10-16 DIAGNOSIS — C50811 Malignant neoplasm of overlapping sites of right female breast: Secondary | ICD-10-CM | POA: Insufficient documentation

## 2017-10-16 NOTE — Progress Notes (Signed)
  Echocardiogram 2D Echocardiogram has been performed.  Bobbye Charleston 10/16/2017, 12:03 PM

## 2017-10-17 DIAGNOSIS — Z170 Estrogen receptor positive status [ER+]: Secondary | ICD-10-CM | POA: Diagnosis not present

## 2017-10-17 DIAGNOSIS — C50212 Malignant neoplasm of upper-inner quadrant of left female breast: Secondary | ICD-10-CM | POA: Insufficient documentation

## 2017-10-17 DIAGNOSIS — C50812 Malignant neoplasm of overlapping sites of left female breast: Secondary | ICD-10-CM | POA: Diagnosis not present

## 2017-10-17 DIAGNOSIS — C50811 Malignant neoplasm of overlapping sites of right female breast: Secondary | ICD-10-CM | POA: Insufficient documentation

## 2017-10-17 DIAGNOSIS — Z510 Encounter for antineoplastic radiation therapy: Secondary | ICD-10-CM | POA: Insufficient documentation

## 2017-10-20 ENCOUNTER — Telehealth (HOSPITAL_COMMUNITY): Payer: Self-pay | Admitting: Vascular Surgery

## 2017-10-22 ENCOUNTER — Ambulatory Visit
Admission: RE | Admit: 2017-10-22 | Discharge: 2017-10-22 | Disposition: A | Discharge status: Home / Self Care | Payer: Medicare Other | Source: Ambulatory Visit | Attending: Radiation Oncology | Admitting: Radiation Oncology

## 2017-10-22 DIAGNOSIS — Z510 Encounter for antineoplastic radiation therapy: Secondary | ICD-10-CM | POA: Diagnosis not present

## 2017-10-22 DIAGNOSIS — C50811 Malignant neoplasm of overlapping sites of right female breast: Secondary | ICD-10-CM | POA: Diagnosis not present

## 2017-10-22 DIAGNOSIS — C50212 Malignant neoplasm of upper-inner quadrant of left female breast: Secondary | ICD-10-CM

## 2017-10-22 DIAGNOSIS — C50812 Malignant neoplasm of overlapping sites of left female breast: Secondary | ICD-10-CM | POA: Diagnosis not present

## 2017-10-22 DIAGNOSIS — Z170 Estrogen receptor positive status [ER+]: Secondary | ICD-10-CM | POA: Diagnosis not present

## 2017-10-22 MED ORDER — ALRA NON-METALLIC DEODORANT (RAD-ONC)
1.0000 "application " | Freq: Once | TOPICAL | Status: AC
  Administered 2017-10-22: 1 via TOPICAL

## 2017-10-22 MED ORDER — RADIAPLEXRX EX GEL
Freq: Once | CUTANEOUS | Status: AC
  Administered 2017-10-22: 09:00:00 via TOPICAL

## 2017-10-22 NOTE — Progress Notes (Signed)

## 2017-10-23 ENCOUNTER — Ambulatory Visit
Admission: RE | Admit: 2017-10-23 | Discharge: 2017-10-23 | Disposition: A | Discharge status: Home / Self Care | Payer: Medicare Other | Source: Ambulatory Visit | Attending: Radiation Oncology | Admitting: Radiation Oncology

## 2017-10-23 DIAGNOSIS — Z510 Encounter for antineoplastic radiation therapy: Secondary | ICD-10-CM | POA: Diagnosis not present

## 2017-10-23 DIAGNOSIS — C50812 Malignant neoplasm of overlapping sites of left female breast: Secondary | ICD-10-CM | POA: Diagnosis not present

## 2017-10-23 DIAGNOSIS — C50811 Malignant neoplasm of overlapping sites of right female breast: Secondary | ICD-10-CM | POA: Diagnosis not present

## 2017-10-23 DIAGNOSIS — Z170 Estrogen receptor positive status [ER+]: Secondary | ICD-10-CM | POA: Diagnosis not present

## 2017-10-23 DIAGNOSIS — C50212 Malignant neoplasm of upper-inner quadrant of left female breast: Secondary | ICD-10-CM | POA: Diagnosis not present

## 2017-10-24 ENCOUNTER — Ambulatory Visit
Admission: RE | Admit: 2017-10-24 | Discharge: 2017-10-24 | Disposition: A | Discharge status: Home / Self Care | Payer: Medicare Other | Source: Ambulatory Visit | Attending: Radiation Oncology | Admitting: Radiation Oncology

## 2017-10-24 DIAGNOSIS — C50212 Malignant neoplasm of upper-inner quadrant of left female breast: Secondary | ICD-10-CM | POA: Diagnosis not present

## 2017-10-24 DIAGNOSIS — Z170 Estrogen receptor positive status [ER+]: Principal | ICD-10-CM

## 2017-10-24 DIAGNOSIS — Z510 Encounter for antineoplastic radiation therapy: Secondary | ICD-10-CM | POA: Diagnosis not present

## 2017-10-24 DIAGNOSIS — C50811 Malignant neoplasm of overlapping sites of right female breast: Secondary | ICD-10-CM | POA: Diagnosis not present

## 2017-10-24 DIAGNOSIS — C50812 Malignant neoplasm of overlapping sites of left female breast: Secondary | ICD-10-CM | POA: Diagnosis not present

## 2017-10-24 MED ORDER — ALRA NON-METALLIC DEODORANT (RAD-ONC)
1.0000 "application " | Freq: Once | TOPICAL | Status: AC
  Administered 2017-10-24: 1 via TOPICAL

## 2017-10-24 MED ORDER — RADIAPLEXRX EX GEL
Freq: Once | CUTANEOUS | Status: AC
  Administered 2017-10-24: 15:00:00 via TOPICAL

## 2017-10-27 ENCOUNTER — Inpatient Hospital Stay: Payer: Medicare Other | Attending: Hematology and Oncology | Admitting: Genetics

## 2017-10-27 ENCOUNTER — Ambulatory Visit
Admission: RE | Admit: 2017-10-27 | Discharge: 2017-10-27 | Disposition: A | Discharge status: Home / Self Care | Payer: Medicare Other | Source: Ambulatory Visit | Attending: Radiation Oncology | Admitting: Radiation Oncology

## 2017-10-27 DIAGNOSIS — C50811 Malignant neoplasm of overlapping sites of right female breast: Secondary | ICD-10-CM

## 2017-10-27 DIAGNOSIS — Z5112 Encounter for antineoplastic immunotherapy: Secondary | ICD-10-CM | POA: Insufficient documentation

## 2017-10-27 DIAGNOSIS — Z170 Estrogen receptor positive status [ER+]: Secondary | ICD-10-CM

## 2017-10-27 DIAGNOSIS — C50212 Malignant neoplasm of upper-inner quadrant of left female breast: Secondary | ICD-10-CM

## 2017-10-27 DIAGNOSIS — C50812 Malignant neoplasm of overlapping sites of left female breast: Secondary | ICD-10-CM | POA: Diagnosis not present

## 2017-10-27 DIAGNOSIS — Z510 Encounter for antineoplastic radiation therapy: Secondary | ICD-10-CM | POA: Diagnosis not present

## 2017-10-28 ENCOUNTER — Ambulatory Visit
Admission: RE | Admit: 2017-10-28 | Discharge: 2017-10-28 | Disposition: A | Discharge status: Home / Self Care | Payer: Medicare Other | Source: Ambulatory Visit | Attending: Radiation Oncology | Admitting: Radiation Oncology

## 2017-10-28 DIAGNOSIS — C50811 Malignant neoplasm of overlapping sites of right female breast: Secondary | ICD-10-CM | POA: Diagnosis not present

## 2017-10-28 DIAGNOSIS — Z170 Estrogen receptor positive status [ER+]: Secondary | ICD-10-CM | POA: Diagnosis not present

## 2017-10-28 DIAGNOSIS — C50812 Malignant neoplasm of overlapping sites of left female breast: Secondary | ICD-10-CM | POA: Diagnosis not present

## 2017-10-28 DIAGNOSIS — C50212 Malignant neoplasm of upper-inner quadrant of left female breast: Secondary | ICD-10-CM | POA: Diagnosis not present

## 2017-10-28 DIAGNOSIS — Z510 Encounter for antineoplastic radiation therapy: Secondary | ICD-10-CM | POA: Diagnosis not present

## 2017-10-28 NOTE — Progress Notes (Signed)
REFERRING PROVIDER: Nicholas Lose, MD [address] [place], [place] [address]  PRIMARY PROVIDER:  [name], DO  PRIMARY REASON FOR VISIT:  1. Malignant neoplasm of upper-inner quadrant of left breast in female, estrogen receptor positive ([place])   2. 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 female, was seen for a [facility] cancer genetics consultation at the request of [name] due to a personal history of bilateral breast cancer.  Charlene Rubio presents to clinic today to discuss the possibility of a hereditary predisposition to cancer, genetic testing, and to further clarify her future cancer risks, as well as potential cancer risks for family members.   In Nov 2018, at the age of 50, Charlene Rubio was diagnosed with bilateral breast cancer.  Left breast was ER+, PR-, HER2+ and her right breast was ER+, PR+, HER2-.  Charlene Rubio underwent bilateral mastectomy and is now undergoing herceptin treatment, and adjuvant radiation.  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, estrogen receptor positive ([place])   06/23/2017 Initial Diagnosis    Left breast 11 o'clock position 6.3 x 4.2 x 6.1 cm mass with thick papillary projections, 2 adjacent left axillary lymph nodes; right breast 930 position 1.7 cm mass; left breast and left lymph node biopsy revealed IDC grade 3, ER 5%, PR 0%, Ki-67 50%, HER-2 positive ratio 2.04 T3N1 stage IIIa; right breast: Grade 1 IDC  ER 95%, PR 95%, Ki-67 15%, HER-2 negative; T1CN0 stage I a      08/27/2017 Surgery    Right simple mastectomy: IDC grade 1, 1.1 cm, DCIS low-grade, margins negative, 0/2 lymph nodes negative; ER 95%, PR 95%, HER-2 negative Ki-67 15% T1 cN0 stage I a left simple mastectomy: IDC grade 3, 6.2 cm, 4/13 lymph nodes positive,  margins negative, ER 5 %, PR 0%, HER-2 positive ratio 2.04, Ki-67 50 % T3 N2 stage IIIa         HORMONAL RISK FACTORS:  Ovaries intact: yes.  Hysterectomy: no.  Menopausal status: postmenopausal.  Colonoscopy:  Unsure if she has had one.  Mammogram within the last year: yes.  Past Medical History:  Diagnosis Date  . Acute diastolic (congestive) heart failure ([place]) 04/25/2017  . Acute respiratory failure with hypoxia and hypercapnia ([place]) 04/25/2017  . ALLERGIC RHINITIS 07/01/2007   Qualifier: Diagnosis of  By: Sherren Mocha MD, Jory Ee   . Allergy   . Aortic insufficiency   . Aortic valve disorder 07/01/2007   Qualifier: Diagnosis of  By: Sherren Mocha MD, Jory Ee   . Arthritis   . Bilateral leg edema 04/25/2017  . Borderline diabetic   . Cancer [facility])    Breast cancer  . CHF (congestive heart failure) ([place]) 04/25/2017  . Dementia 04/30/2017  . Diabetes mellitus ([place]) 04/25/2017  . DOE (dyspnea on exertion) 04/25/2017  . Essential hypertension 07/01/2007   Qualifier: Diagnosis of  By: Sherren Mocha MD, Jory Ee Glaucoma suspect   . Hypertension   . KNEE PAIN, LEFT 04/06/2010   Qualifier: Diagnosis of  By: Sherren Mocha MD, Jory Ee   . Nonischemic cardiomyopathy ([place])   . Obesity   . Obesity, unspecified 07/01/2007   Qualifier: Diagnosis of  By: Sherren Mocha MD, Jory Ee   . Peripheral vascular disease ([place])  blood clots in legs  . Thyroid disease     Past Surgical History:  Procedure Laterality Date  . CESAREAN SECTION    . MASTECTOMY  08/27/2017   LEFT BREAST MODIFIED RADICAL MASTECTOMY (Left Breast)  . MASTECTOMY  08/27/2017   RIGHT TOTAL MASTECTOMY WITH SENTINEL LYMPH NODE BIOPSY (Right Breast)  . MASTECTOMY W/ SENTINEL NODE BIOPSY Right 08/27/2017   Procedure: RIGHT TOTAL MASTECTOMY WITH SENTINEL LYMPH NODE BIOPSY;  Surgeon: Fanny Skates, MD;  Location: [facility];  Service: General;  Laterality: Right;  . MODIFIED MASTECTOMY Left 08/27/2017   Procedure: LEFT BREAST MODIFIED RADICAL MASTECTOMY;  Surgeon:  Fanny Skates, MD;  Location: [facility];  Service: General;  Laterality: Left;  . PORTACATH PLACEMENT Right 10/09/2017  . PORTACATH PLACEMENT Right 10/09/2017   Procedure: INSERTION PORT-A-CATH WITH ULTRASOUND;  Surgeon: Fanny Skates, MD;  Location: [facility];  Service: General;  Laterality: Right;    Social History   Socioeconomic History  . Marital status: Married    Spouse name: Not on file  . Number of children: Not on file  . Years of education: Not on file  . Highest education level: Not on file  Social Needs  . Financial resource strain: Not on file  . Food insecurity - worry: Not on file  . Food insecurity - inability: Not on file  . Transportation needs - medical: Not on file  . Transportation needs - non-medical: Not on file  Occupational History  . Occupation: retired Building control surveyor  Tobacco Use  . Smoking status: Never Smoker  . Smokeless tobacco: Never Used  Substance and Sexual Activity  . Alcohol use: No  . Drug use: No  . Sexual activity: Not Currently  Other Topics Concern  . Not on file  Social History Narrative   Social History   Diet?    Do you drink/eat things with caffeine? Yes (minimal) coke   Marital status?          married                          What year were you married? 1968   Do you live in a house, apartment, assisted living, condo, trailer, etc.? house   Is it one or more stories? one   How many persons live in your home? 2   Do you have any pets in your home? (please list) none   Highest level of education completed?   Current or past profession: preschool teacher/nanny   Do you exercise?         0                             Type & how often?   Advanced Directives   Do you have a living will?   Do you have a DNR form?                                  If not, do you want to discuss one?   Do you have signed POA/HPOA for forms?    Functional Status   Do you have difficulty bathing or dressing yourself?   Do you have difficulty preparing food  or eating?    Do you have difficulty managing your medications?   Do you have difficulty managing your finances?   Do you have difficulty affording your  medications?    Admitted to [facility] 05/01/17   Never smoked   Alcohol none   DNR        FAMILY HISTORY:  We obtained a detailed, 4-generation family history.  Significant diagnoses are listed below: Family History  Problem Relation Age of Onset  . Alzheimer's disease Mother 51  . Alzheimer's disease Father 22  . Congestive Heart Failure Father   . Alcoholism Brother   . Alzheimer's disease Brother   . Dementia Brother   . Alcoholism Brother   . Diabetes Brother    Ms. Oka has 3 sons with no history of cancer.  She has some grandchildren- the oldest is 63.  Ms. Aarons ahs 3 brothers- 2 in their 48's with no history of cancer and one who died at 48 with no history of cancer.    Ms. Zaino's father died at 17 with no history of cancer.  Ms. Truax has 4 paternal aunts and 4 paternal uncles with no history of cancer, although she has limited information about these relatives.  She is not aware of any cancer in her paternal relatives, although has limited information about these relatives.  Ms. Siharath paternal grandfather died at 96 with no history of cancer, and her paternal grandmother had no history of cancer.    Ms. Logan's mother died at 35 with no history of cancer.  Ms. Pellicane has 3 maternal aunts and 4 maternal uncles with no history of cancer.  No maternal cousins with any history of cancer.  Her maternal grandparents died in their 71's and 56's with no history of cancer.    Ms. Virella is unaware of previous family history of genetic testing for hereditary cancer risks. Patient's maternal ancestors are of Northern European descent, and paternal ancestors are of English descent. There is no reported Ashkenazi Jewish ancestry. There is no known consanguinity.  GENETIC COUNSELING ASSESSMENT: DENECE SHEARER is a 72 y.o. female  with a personal history which is somewhat suggestive of a Hereditary Cancer Predisposition Syndrome. We, therefore, discussed and recommended the following at today's visit.   DISCUSSION: We reviewed the characteristics, features and inheritance patterns of hereditary cancer syndromes. We also discussed genetic testing, including the appropriate family members to test, the process of testing, insurance coverage and turn-around-time for results. We discussed the implications of a negative, positive and/or variant of uncertain significant result. We offered Ms. Veals the option to pursue genetic testing for the Common Hereditary Cancer gene panel. The Common Hereditary Cancer Panel offered by [facility] includes sequencing and/or deletion duplication testing of the following 47 genes: APC, ATM, AXIN2, BARD1, BMPR1A, BRCA1, BRCA2, BRIP1, CDH1, CDKN2A (p14ARF), CDKN2A (p16INK4a), CKD4, CHEK2, CTNNA1, DICER1, EPCAM (Deletion/duplication testing only), GREM1 (promoter region deletion/duplication testing only), KIT, MEN1, MLH1, MSH2, MSH3, MSH6, MUTYH, NBN, NF1, NHTL1, PALB2, PDGFRA, PMS2, POLD1, POLE, PTEN, RAD50, RAD51C, RAD51D, SDHB, SDHC, SDHD, SMAD4, SMARCA4. STK11, TP53, TSC1, TSC2, and VHL.  The following genes were evaluated for sequence changes only: SDHA and HOXB13 c.251G>A variant only.  We discussed that only 5-10% of cancers are associated with a Hereditary cancer predisposition syndrome.  One of the most common hereditary cancer syndromes that increases breast cancer risk is called Hereditary Breast and Ovarian Cancer (HBOC) syndrome.  This syndrome is caused by mutations in the BRCA1 and BRCA2 genes.  This syndrome increases an individual's lifetime risk to develop breast, ovarian, pancreatic, and other types of cancer.  There are also many other cancer predisposition syndromes caused  by mutations in several other genes.  We discussed that if she is found to have a mutation in one of these genes, it may  impact future medical management recommendations such as increased cancer screenings and consideration of risk reducing surgeries.  A positive result could also have implications for the patient's family members.  A Negative result would mean we were unable to identify a hereditary component to her cancer, but does not rule out the possibility of a hereditary basis for her  cancer.  There could be mutations that are undetectable by current technology, or in genes not yet tested or identified to increase cancer risk.    We discussed the potential to find a Variant of Uncertain Significance or VUS.  These are variants that have not yet been identified as pathogenic or benign, and it is unknown if this variant is associated with increased cancer risk or if this is a normal finding.  Most VUS's are reclassified to benign or likely benign.   It should not be used to make medical management decisions. With time, we suspect the lab will determine the significance of any VUS's identified if any.   We discussed with Ms. Eagleson that the family history is not highly consistent with a familial hereditary cancer syndrome, and we feel she is at low risk to harbor a gene mutation associated with such a condition. Thus, we did not recommend any genetic testing, at this time.  However, If she would like to pursue we could coordinate this for her. We discussed that if her out of pocket cost for testing is over $100, the laboratory will call and confirm whether she wants to proceed with testing.  If the out of pocket cost of testing is less than $100 she will be billed by the genetic testing laboratory.     PLAN: After considering the risks, benefits, and limitations, Ms. Pascarella  provided informed consent to pursue genetic testing and her blood will be drawn on 4/9.  It will then be sent to [facility] for analysis of the Common Hereditary Cancers Panel. Results should be available within approximately 2-3 weeks' time,  at which point they will be disclosed by telephone to Ms. Buren, as will any additional recommendations warranted by these results. Ms. Lacasse will receive a summary of her genetic counseling visit and a copy of her results once available. This information will also be available in Epic. We encouraged Ms. Stjulien to remain in contact with cancer genetics annually so that we can continuously update the family history and inform her of any changes in cancer genetics and testing that may be of benefit for her family. Ms. Riker questions were answered to her satisfaction today. Our contact information was provided should additional questions or concerns arise.  Lastly, we encouraged Ms. Marian to remain in contact with cancer genetics annually so that we can continuously update the family history and inform her of any changes in cancer genetics and testing that may be of benefit for this family.   Ms.  Panameno questions were answered to her satisfaction today. Our contact information was provided should additional questions or concerns arise. Thank you for the referral and allowing [place] to share in the care of your patient.   Tana Felts, MS, [facility] Certified Genetic Counselor [email or web address] phone: [phone]  The patient was seen for a total of 55 minutes in face-to-face genetic counseling.  The patient was accompanied today by her husband.  Intern, Murriel Hopper was present  and observed this session.  This patient was discussed with Drs. Magrinat, Lindi Adie and/or Burr Medico who agrees with the above.

## 2017-10-29 ENCOUNTER — Ambulatory Visit
Admission: RE | Admit: 2017-10-29 | Discharge: 2017-10-29 | Disposition: A | Discharge status: Home / Self Care | Payer: Medicare Other | Source: Ambulatory Visit | Attending: Radiation Oncology | Admitting: Radiation Oncology

## 2017-10-29 DIAGNOSIS — C50812 Malignant neoplasm of overlapping sites of left female breast: Secondary | ICD-10-CM | POA: Diagnosis not present

## 2017-10-29 DIAGNOSIS — Z170 Estrogen receptor positive status [ER+]: Secondary | ICD-10-CM | POA: Diagnosis not present

## 2017-10-29 DIAGNOSIS — C50212 Malignant neoplasm of upper-inner quadrant of left female breast: Secondary | ICD-10-CM | POA: Diagnosis not present

## 2017-10-29 DIAGNOSIS — Z510 Encounter for antineoplastic radiation therapy: Secondary | ICD-10-CM | POA: Diagnosis not present

## 2017-10-29 DIAGNOSIS — C50811 Malignant neoplasm of overlapping sites of right female breast: Secondary | ICD-10-CM | POA: Diagnosis not present

## 2017-10-30 ENCOUNTER — Ambulatory Visit
Admission: RE | Admit: 2017-10-30 | Discharge: 2017-10-30 | Disposition: A | Discharge status: Home / Self Care | Payer: Medicare Other | Source: Ambulatory Visit | Attending: Radiation Oncology | Admitting: Radiation Oncology

## 2017-10-30 DIAGNOSIS — C50212 Malignant neoplasm of upper-inner quadrant of left female breast: Secondary | ICD-10-CM | POA: Diagnosis not present

## 2017-10-30 DIAGNOSIS — Z170 Estrogen receptor positive status [ER+]: Secondary | ICD-10-CM | POA: Diagnosis not present

## 2017-10-30 DIAGNOSIS — C50812 Malignant neoplasm of overlapping sites of left female breast: Secondary | ICD-10-CM | POA: Diagnosis not present

## 2017-10-30 DIAGNOSIS — C50811 Malignant neoplasm of overlapping sites of right female breast: Secondary | ICD-10-CM | POA: Diagnosis not present

## 2017-10-30 DIAGNOSIS — Z510 Encounter for antineoplastic radiation therapy: Secondary | ICD-10-CM | POA: Diagnosis not present

## 2017-10-31 ENCOUNTER — Ambulatory Visit
Admission: RE | Admit: 2017-10-31 | Discharge: 2017-10-31 | Disposition: A | Discharge status: Home / Self Care | Payer: Medicare Other | Source: Ambulatory Visit | Attending: Radiation Oncology | Admitting: Radiation Oncology

## 2017-10-31 DIAGNOSIS — C50811 Malignant neoplasm of overlapping sites of right female breast: Secondary | ICD-10-CM | POA: Diagnosis not present

## 2017-10-31 DIAGNOSIS — C50812 Malignant neoplasm of overlapping sites of left female breast: Secondary | ICD-10-CM | POA: Diagnosis not present

## 2017-10-31 DIAGNOSIS — Z510 Encounter for antineoplastic radiation therapy: Secondary | ICD-10-CM | POA: Diagnosis not present

## 2017-10-31 DIAGNOSIS — Z170 Estrogen receptor positive status [ER+]: Secondary | ICD-10-CM | POA: Diagnosis not present

## 2017-10-31 DIAGNOSIS — C50212 Malignant neoplasm of upper-inner quadrant of left female breast: Secondary | ICD-10-CM | POA: Diagnosis not present

## 2017-10-31 NOTE — Telephone Encounter (Signed)
encounter opened I error

## 2017-11-03 ENCOUNTER — Ambulatory Visit
Admission: RE | Admit: 2017-11-03 | Discharge: 2017-11-03 | Disposition: A | Discharge status: Home / Self Care | Payer: Medicare Other | Source: Ambulatory Visit | Attending: Radiation Oncology | Admitting: Radiation Oncology

## 2017-11-03 DIAGNOSIS — C50212 Malignant neoplasm of upper-inner quadrant of left female breast: Secondary | ICD-10-CM | POA: Diagnosis not present

## 2017-11-03 DIAGNOSIS — C50812 Malignant neoplasm of overlapping sites of left female breast: Secondary | ICD-10-CM | POA: Diagnosis not present

## 2017-11-03 DIAGNOSIS — Z510 Encounter for antineoplastic radiation therapy: Secondary | ICD-10-CM | POA: Diagnosis not present

## 2017-11-03 DIAGNOSIS — C50811 Malignant neoplasm of overlapping sites of right female breast: Secondary | ICD-10-CM | POA: Diagnosis not present

## 2017-11-03 DIAGNOSIS — Z170 Estrogen receptor positive status [ER+]: Secondary | ICD-10-CM | POA: Diagnosis not present

## 2017-11-04 ENCOUNTER — Ambulatory Visit
Admission: RE | Admit: 2017-11-04 | Discharge: 2017-11-04 | Disposition: A | Discharge status: Home / Self Care | Payer: Medicare Other | Source: Ambulatory Visit | Attending: Radiation Oncology | Admitting: Radiation Oncology

## 2017-11-04 ENCOUNTER — Inpatient Hospital Stay: Payer: Medicare Other

## 2017-11-04 VITALS — BP 166/65 | HR 67 | Temp 98.5°F | Resp 18

## 2017-11-04 DIAGNOSIS — C50812 Malignant neoplasm of overlapping sites of left female breast: Secondary | ICD-10-CM | POA: Diagnosis not present

## 2017-11-04 DIAGNOSIS — Z170 Estrogen receptor positive status [ER+]: Secondary | ICD-10-CM | POA: Diagnosis not present

## 2017-11-04 DIAGNOSIS — Z5112 Encounter for antineoplastic immunotherapy: Secondary | ICD-10-CM | POA: Diagnosis not present

## 2017-11-04 DIAGNOSIS — C50212 Malignant neoplasm of upper-inner quadrant of left female breast: Secondary | ICD-10-CM | POA: Diagnosis not present

## 2017-11-04 DIAGNOSIS — Z510 Encounter for antineoplastic radiation therapy: Secondary | ICD-10-CM | POA: Diagnosis not present

## 2017-11-04 DIAGNOSIS — C50811 Malignant neoplasm of overlapping sites of right female breast: Secondary | ICD-10-CM

## 2017-11-04 MED ORDER — TRASTUZUMAB CHEMO 150 MG IV SOLR
450.0000 mg | Freq: Once | INTRAVENOUS | Status: AC
  Administered 2017-11-04: 450 mg via INTRAVENOUS
  Filled 2017-11-04: qty 21.43

## 2017-11-04 MED ORDER — SODIUM CHLORIDE 0.9% FLUSH
10.0000 mL | INTRAVENOUS | Status: DC | PRN
  Administered 2017-11-04: 10 mL
  Filled 2017-11-04: qty 10

## 2017-11-04 MED ORDER — SODIUM CHLORIDE 0.9 % IV SOLN
Freq: Once | INTRAVENOUS | Status: AC
  Administered 2017-11-04: 11:00:00 via INTRAVENOUS

## 2017-11-04 MED ORDER — HEPARIN SOD (PORK) LOCK FLUSH 100 UNIT/ML IV SOLN
500.0000 [IU] | Freq: Once | INTRAVENOUS | Status: AC | PRN
  Administered 2017-11-04: 500 [IU]
  Filled 2017-11-04: qty 5

## 2017-11-04 MED ORDER — DIPHENHYDRAMINE HCL 25 MG PO CAPS
50.0000 mg | ORAL_CAPSULE | Freq: Once | ORAL | Status: AC
  Administered 2017-11-04: 50 mg via ORAL

## 2017-11-04 MED ORDER — ACETAMINOPHEN 325 MG PO TABS
ORAL_TABLET | ORAL | Status: AC
Start: 2017-11-04 — End: 2017-11-04
  Filled 2017-11-04: qty 2

## 2017-11-04 MED ORDER — DIPHENHYDRAMINE HCL 25 MG PO CAPS
ORAL_CAPSULE | ORAL | Status: AC
  Filled 2017-11-04: qty 2

## 2017-11-04 MED ORDER — ACETAMINOPHEN 325 MG PO TABS
650.0000 mg | ORAL_TABLET | Freq: Once | ORAL | Status: AC
  Administered 2017-11-04: 650 mg via ORAL

## 2017-11-04 NOTE — Patient Instructions (Signed)
[facility] Discharge Instructions for Patients Receiving Chemotherapy  Today you received the following chemotherapy agents Herceptin  To help prevent nausea and vomiting after your treatment, we encourage you to take your nausea medication as directed   If you develop nausea and vomiting that is not controlled by your nausea medication, call the clinic.   BELOW ARE SYMPTOMS THAT SHOULD BE REPORTED IMMEDIATELY:  *FEVER GREATER THAN 100.5 F  *CHILLS WITH OR WITHOUT FEVER  NAUSEA AND VOMITING THAT IS NOT CONTROLLED WITH YOUR NAUSEA MEDICATION  *UNUSUAL SHORTNESS OF BREATH  *UNUSUAL BRUISING OR BLEEDING  TENDERNESS IN MOUTH AND THROAT WITH OR WITHOUT PRESENCE OF ULCERS  *URINARY PROBLEMS  *BOWEL PROBLEMS  UNUSUAL RASH Items with * indicate a potential emergency and should be followed up as soon as possible.  Feel free to call the clinic should you have any questions or concerns. The clinic phone number is [phone].  Please show the CHEMO ALERT CARD at check-in to the Emergency Department and triage nurse.   

## 2017-11-05 ENCOUNTER — Ambulatory Visit
Admission: RE | Admit: 2017-11-05 | Discharge: 2017-11-05 | Disposition: A | Discharge status: Home / Self Care | Payer: Medicare Other | Source: Ambulatory Visit | Attending: Radiation Oncology | Admitting: Radiation Oncology

## 2017-11-05 DIAGNOSIS — C50212 Malignant neoplasm of upper-inner quadrant of left female breast: Secondary | ICD-10-CM | POA: Diagnosis not present

## 2017-11-05 DIAGNOSIS — Z170 Estrogen receptor positive status [ER+]: Secondary | ICD-10-CM | POA: Diagnosis not present

## 2017-11-05 DIAGNOSIS — C50812 Malignant neoplasm of overlapping sites of left female breast: Secondary | ICD-10-CM | POA: Diagnosis not present

## 2017-11-05 DIAGNOSIS — C50811 Malignant neoplasm of overlapping sites of right female breast: Secondary | ICD-10-CM | POA: Diagnosis not present

## 2017-11-05 DIAGNOSIS — Z510 Encounter for antineoplastic radiation therapy: Secondary | ICD-10-CM | POA: Diagnosis not present

## 2017-11-06 ENCOUNTER — Ambulatory Visit
Admission: RE | Admit: 2017-11-06 | Discharge: 2017-11-06 | Disposition: A | Discharge status: Home / Self Care | Payer: Medicare Other | Source: Ambulatory Visit | Attending: Radiation Oncology | Admitting: Radiation Oncology

## 2017-11-06 DIAGNOSIS — Z510 Encounter for antineoplastic radiation therapy: Secondary | ICD-10-CM | POA: Diagnosis not present

## 2017-11-06 DIAGNOSIS — C50811 Malignant neoplasm of overlapping sites of right female breast: Secondary | ICD-10-CM | POA: Diagnosis not present

## 2017-11-06 DIAGNOSIS — Z170 Estrogen receptor positive status [ER+]: Secondary | ICD-10-CM | POA: Diagnosis not present

## 2017-11-06 DIAGNOSIS — C50212 Malignant neoplasm of upper-inner quadrant of left female breast: Secondary | ICD-10-CM | POA: Diagnosis not present

## 2017-11-06 DIAGNOSIS — C50812 Malignant neoplasm of overlapping sites of left female breast: Secondary | ICD-10-CM | POA: Diagnosis not present

## 2017-11-07 ENCOUNTER — Ambulatory Visit
Admission: RE | Admit: 2017-11-07 | Discharge: 2017-11-07 | Disposition: A | Discharge status: Home / Self Care | Payer: Medicare Other | Source: Ambulatory Visit | Attending: Radiation Oncology | Admitting: Radiation Oncology

## 2017-11-07 DIAGNOSIS — C50812 Malignant neoplasm of overlapping sites of left female breast: Secondary | ICD-10-CM | POA: Diagnosis not present

## 2017-11-07 DIAGNOSIS — Z510 Encounter for antineoplastic radiation therapy: Secondary | ICD-10-CM | POA: Diagnosis not present

## 2017-11-07 DIAGNOSIS — C50811 Malignant neoplasm of overlapping sites of right female breast: Secondary | ICD-10-CM | POA: Diagnosis not present

## 2017-11-07 DIAGNOSIS — Z170 Estrogen receptor positive status [ER+]: Secondary | ICD-10-CM | POA: Diagnosis not present

## 2017-11-07 DIAGNOSIS — C50212 Malignant neoplasm of upper-inner quadrant of left female breast: Secondary | ICD-10-CM | POA: Diagnosis not present

## 2017-11-10 ENCOUNTER — Ambulatory Visit
Admission: RE | Admit: 2017-11-10 | Discharge: 2017-11-10 | Disposition: A | Discharge status: Home / Self Care | Payer: Medicare Other | Source: Ambulatory Visit | Attending: Radiation Oncology | Admitting: Radiation Oncology

## 2017-11-10 DIAGNOSIS — Z170 Estrogen receptor positive status [ER+]: Secondary | ICD-10-CM | POA: Diagnosis not present

## 2017-11-10 DIAGNOSIS — Z510 Encounter for antineoplastic radiation therapy: Secondary | ICD-10-CM | POA: Diagnosis not present

## 2017-11-10 DIAGNOSIS — C50812 Malignant neoplasm of overlapping sites of left female breast: Secondary | ICD-10-CM | POA: Diagnosis not present

## 2017-11-10 DIAGNOSIS — C50212 Malignant neoplasm of upper-inner quadrant of left female breast: Secondary | ICD-10-CM | POA: Diagnosis not present

## 2017-11-10 DIAGNOSIS — C50811 Malignant neoplasm of overlapping sites of right female breast: Secondary | ICD-10-CM | POA: Diagnosis not present

## 2017-11-11 ENCOUNTER — Ambulatory Visit
Admission: RE | Admit: 2017-11-11 | Discharge: 2017-11-11 | Disposition: A | Discharge status: Home / Self Care | Payer: Medicare Other | Source: Ambulatory Visit | Attending: Radiation Oncology | Admitting: Radiation Oncology

## 2017-11-11 DIAGNOSIS — C50812 Malignant neoplasm of overlapping sites of left female breast: Secondary | ICD-10-CM | POA: Diagnosis not present

## 2017-11-11 DIAGNOSIS — C50212 Malignant neoplasm of upper-inner quadrant of left female breast: Secondary | ICD-10-CM | POA: Diagnosis not present

## 2017-11-11 DIAGNOSIS — Z510 Encounter for antineoplastic radiation therapy: Secondary | ICD-10-CM | POA: Diagnosis not present

## 2017-11-11 DIAGNOSIS — Z170 Estrogen receptor positive status [ER+]: Secondary | ICD-10-CM | POA: Diagnosis not present

## 2017-11-11 DIAGNOSIS — C50811 Malignant neoplasm of overlapping sites of right female breast: Secondary | ICD-10-CM | POA: Diagnosis not present

## 2017-11-11 IMAGING — MG 2D DIGITAL DIAGNOSTIC BILATERAL MAMMOGRAM WITH CAD AND ADJUNCT T
8 of 20 series · 8 of 40 positions shown · non-contrast
Comparison: None.

CLINICAL DATA: 71-year-old female presenting for evaluation of a
palpable mass in the left breast. Of note, the patient suffers from
suspected dementia and recently had [REDACTED] stay for heart
failure.

EXAM:
2D DIGITAL DIAGNOSTIC BILATERAL MAMMOGRAM WITH CAD AND ADJUNCT TOMO
BILATERAL BREAST ULTRASOUND

[R CC]
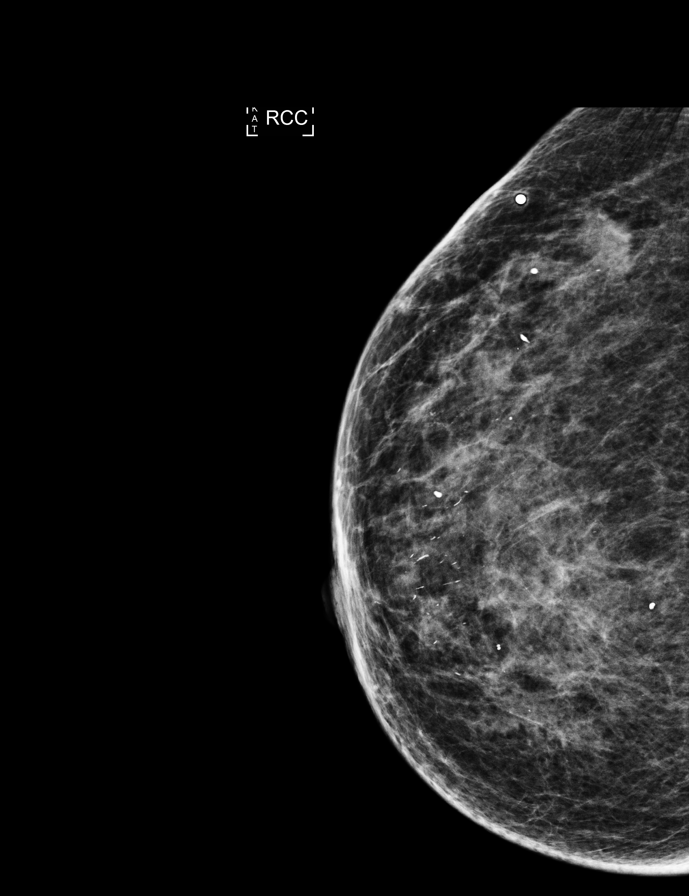

[R MLO (1 of 2)]
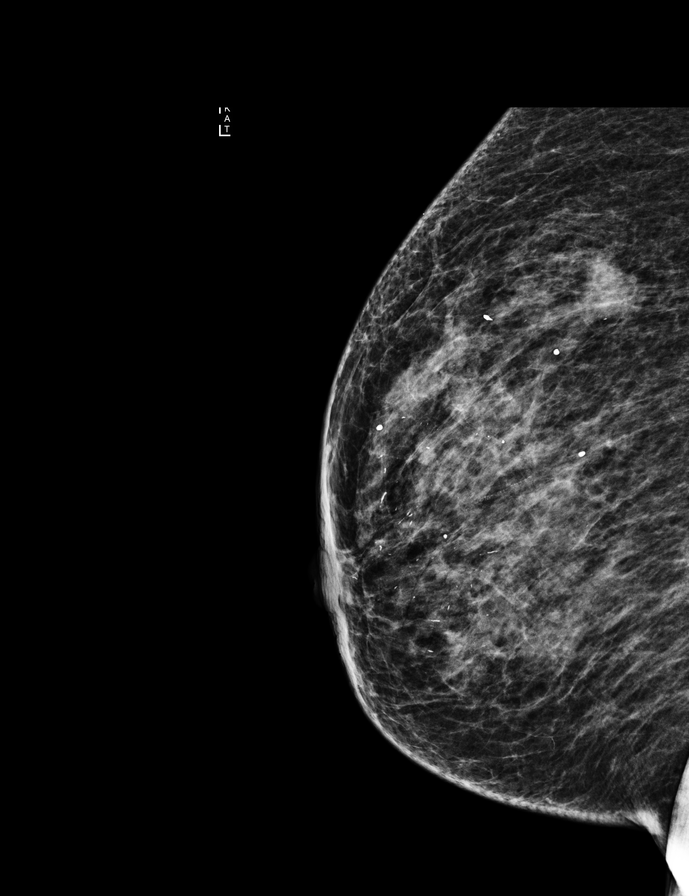

[L CC synth-2D]
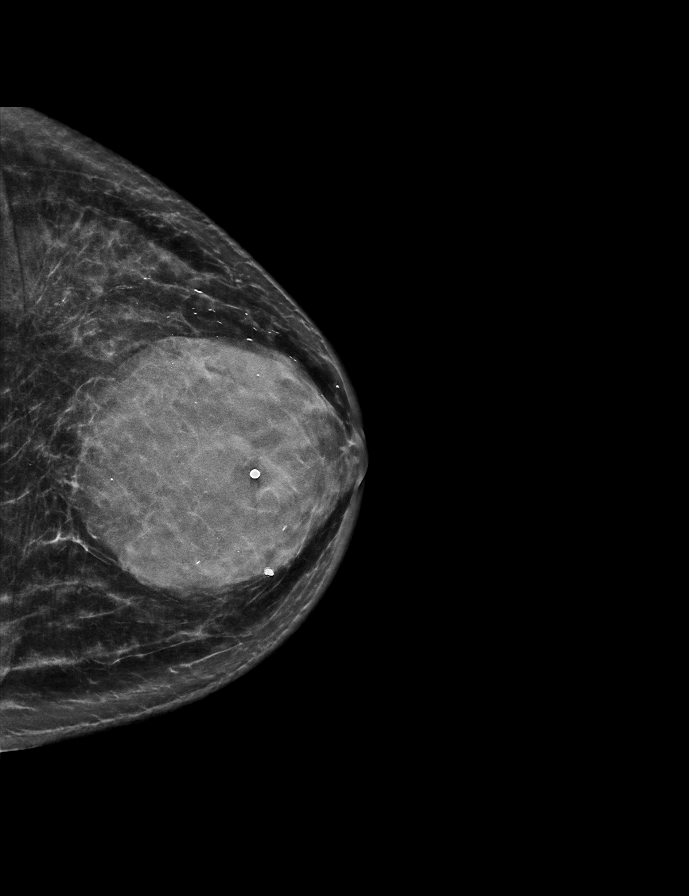

[R TAN synth-2D]
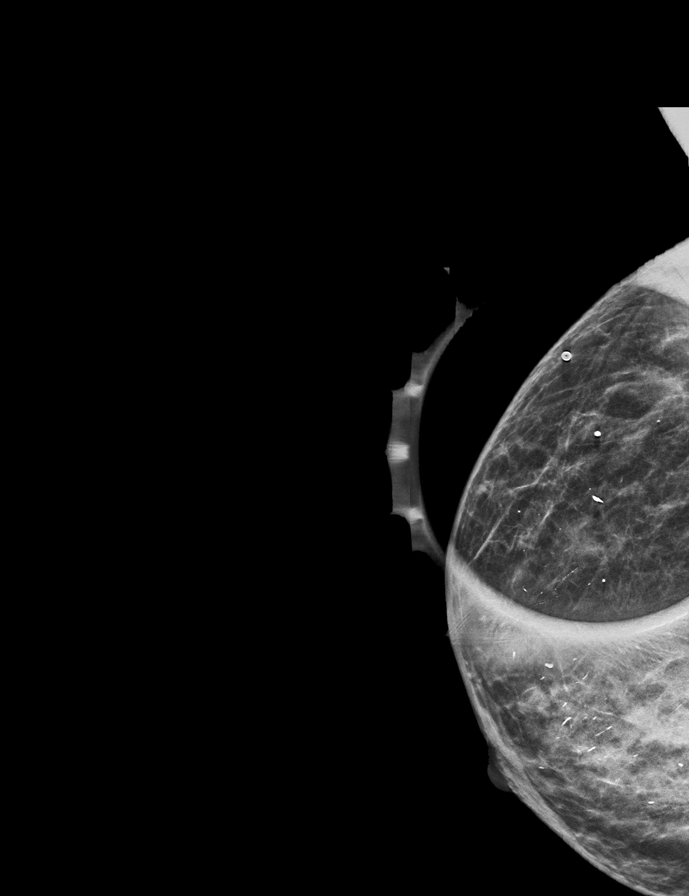

[L CC]
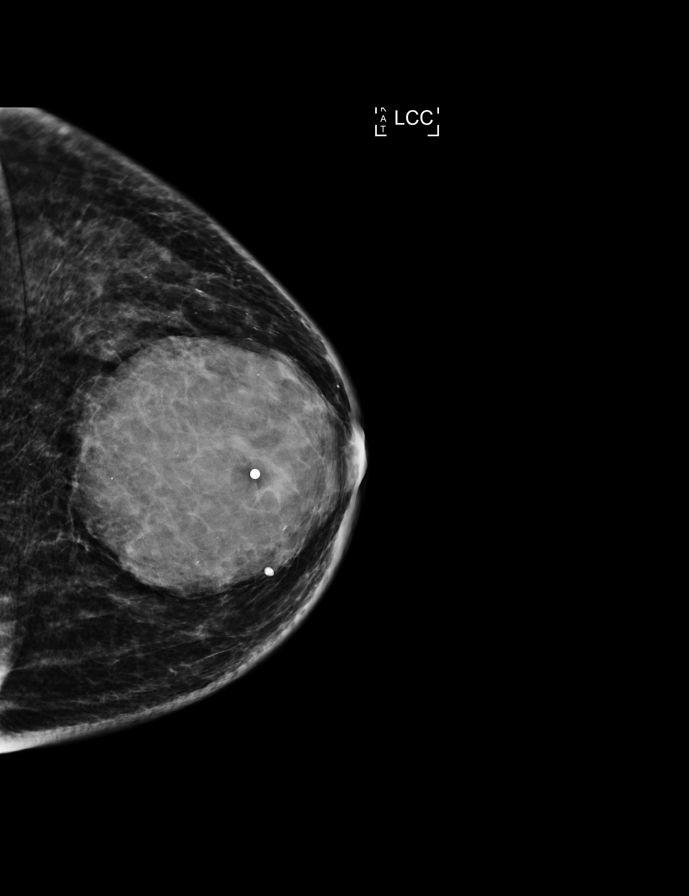

[R MLO synth-2D]
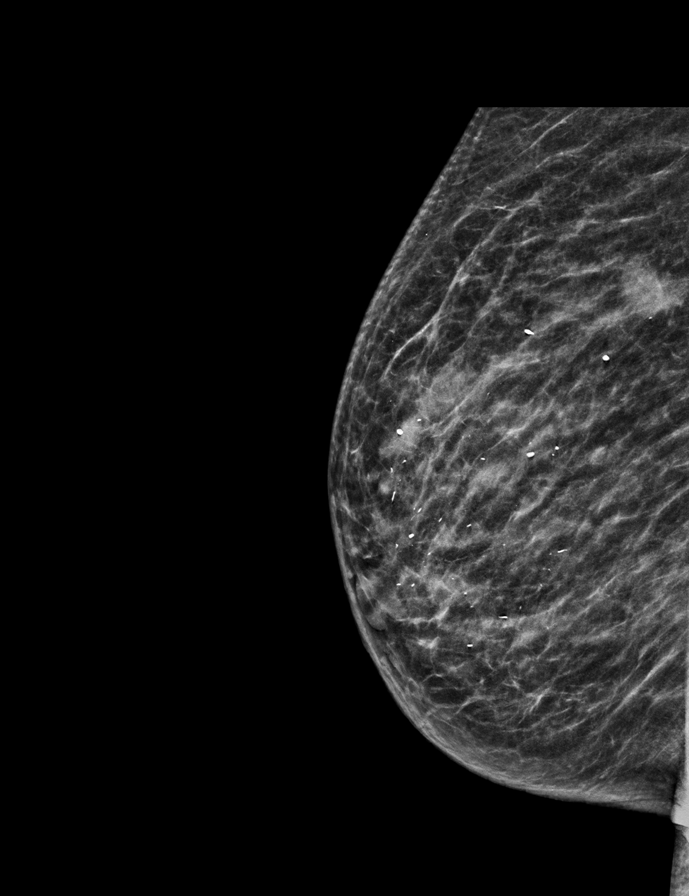

[R MLO (2 of 2)]
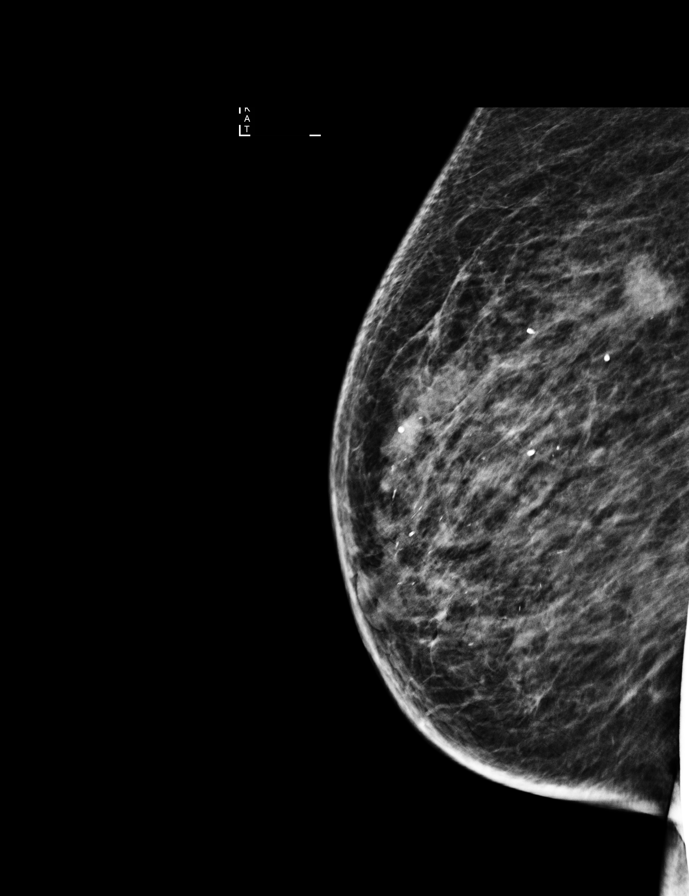

[L MLO synth-2D]
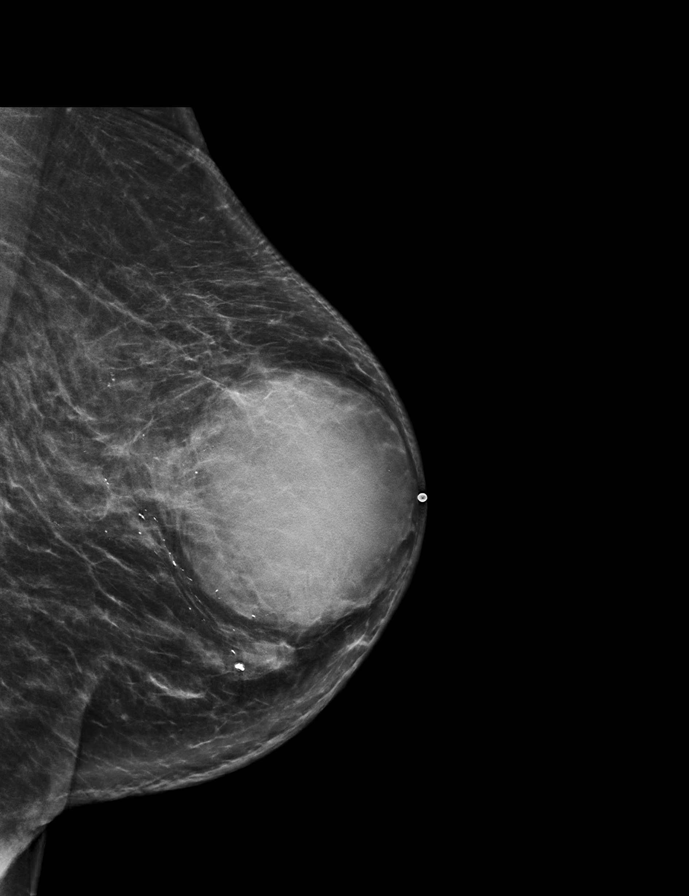

[8 of 40 positions shown; findings below may reference images not displayed]

ACR Breast Density Category c: The breast tissue is heterogeneously
dense, which may obscure small masses.
FINDINGS: On the left breast, a BB has been placed superior to the left nipple
indicating the palpable site of concern. Deep to this marker is a
6.5 cm round dense mass. There is a possible mass inferior to this
seen on the MLO view measuring 1.5 cm.

In the upper-outer quadrant of the right breast, there is a palpable
mass deep to a BB which has been placed on the skin measuring
approximately 1.5 cm. This is irregular with indistinct margins.

Mammographic images were processed with CAD.

Ultrasound targeted to the left breast at 11 o'clock, 4 cm from the
nipple demonstrates a large hypoechoic circumscribed mass with
substantial internal mobile debris measuring 6.3 x 4.2 x 6.1 cm.
There are multiple thick papillary projections into the cystic mass
with marked blood flow on color Doppler imaging. Ultrasound of the
left axilla demonstrates 2 adjacent morphologically abnormal second
lymph nodes.

Ultrasound of the right breast at [DATE], 6 cm from the nipple
demonstrates an irregular hypoechoic mass with indistinct margins
measuring 1.7 x 1.5 x 1.4 cm. Blood flow was identified within the
mass on color Doppler imaging. Ultrasound of the right axilla
demonstrates multiple normal-appearing lymph nodes.
IMPRESSION: 1. There is a palpable 6.3 cm complex mass in the retroareolar left
breast which is highly suspicious for malignancy.

2.  There are 2 abnormal left axillary lymph nodes.

3. There is a highly suspicious 1.7 cm mass in the right breast at
[DATE].

4.  No evidence of right axillary lymphadenopathy.

RECOMMENDATION:
1. Ultrasound-guided biopsy is recommended for the solid portion of
the complex mass in the retroareolar left breast. The patient would
like to have the fluid component drained at the time of biopsy.

2. Ultrasound-guided biopsy of 1 of the abnormal left axillary lymph
nodes is recommended.

3. Ultrasound-guided biopsy of the right breast mass at [DATE] is
recommended.

The above findings and recommendations were discussed with the
patient, her husband and her daughter-in-law. These procedures have
been scheduled for 06/23/2017 at [DATE] p.m..

I have discussed the findings and recommendations with the patient.
Results were also provided in writing at the conclusion of the
visit. If applicable, a reminder letter will be sent to the patient
regarding the next appointment.

BI-RADS CATEGORY  5: Highly suggestive of malignancy.

## 2017-11-12 ENCOUNTER — Ambulatory Visit
Admission: RE | Admit: 2017-11-12 | Discharge: 2017-11-12 | Disposition: A | Discharge status: Home / Self Care | Payer: Medicare Other | Source: Ambulatory Visit | Attending: Radiation Oncology | Admitting: Radiation Oncology

## 2017-11-12 DIAGNOSIS — Z510 Encounter for antineoplastic radiation therapy: Secondary | ICD-10-CM | POA: Diagnosis not present

## 2017-11-12 DIAGNOSIS — C50212 Malignant neoplasm of upper-inner quadrant of left female breast: Secondary | ICD-10-CM | POA: Diagnosis not present

## 2017-11-12 DIAGNOSIS — C50812 Malignant neoplasm of overlapping sites of left female breast: Secondary | ICD-10-CM | POA: Diagnosis not present

## 2017-11-12 DIAGNOSIS — C50811 Malignant neoplasm of overlapping sites of right female breast: Secondary | ICD-10-CM | POA: Diagnosis not present

## 2017-11-12 DIAGNOSIS — Z170 Estrogen receptor positive status [ER+]: Secondary | ICD-10-CM | POA: Diagnosis not present

## 2017-11-13 ENCOUNTER — Ambulatory Visit
Admission: RE | Admit: 2017-11-13 | Discharge: 2017-11-13 | Disposition: A | Discharge status: Home / Self Care | Payer: Medicare Other | Source: Ambulatory Visit | Attending: Radiation Oncology | Admitting: Radiation Oncology

## 2017-11-13 DIAGNOSIS — C50811 Malignant neoplasm of overlapping sites of right female breast: Secondary | ICD-10-CM | POA: Diagnosis not present

## 2017-11-13 DIAGNOSIS — C50812 Malignant neoplasm of overlapping sites of left female breast: Secondary | ICD-10-CM | POA: Diagnosis not present

## 2017-11-13 DIAGNOSIS — Z510 Encounter for antineoplastic radiation therapy: Secondary | ICD-10-CM | POA: Diagnosis not present

## 2017-11-13 DIAGNOSIS — C50212 Malignant neoplasm of upper-inner quadrant of left female breast: Secondary | ICD-10-CM | POA: Diagnosis not present

## 2017-11-13 DIAGNOSIS — Z170 Estrogen receptor positive status [ER+]: Secondary | ICD-10-CM | POA: Diagnosis not present

## 2017-11-14 ENCOUNTER — Encounter: Payer: Self-pay | Admitting: Internal Medicine

## 2017-11-14 ENCOUNTER — Ambulatory Visit (INDEPENDENT_AMBULATORY_CARE_PROVIDER_SITE_OTHER): Payer: Medicare Other | Admitting: Internal Medicine

## 2017-11-14 ENCOUNTER — Ambulatory Visit (INDEPENDENT_AMBULATORY_CARE_PROVIDER_SITE_OTHER): Payer: Medicare Other

## 2017-11-14 ENCOUNTER — Ambulatory Visit
Admission: RE | Admit: 2017-11-14 | Discharge: 2017-11-14 | Disposition: A | Discharge status: Home / Self Care | Payer: Medicare Other | Source: Ambulatory Visit | Attending: Radiation Oncology | Admitting: Radiation Oncology

## 2017-11-14 VITALS — BP 156/60 | HR 67 | Temp 98.2°F | Ht 66.0 in | Wt 161.0 lb

## 2017-11-14 DIAGNOSIS — Z0000 Encounter for general adult medical examination without abnormal findings: Secondary | ICD-10-CM | POA: Diagnosis not present

## 2017-11-14 DIAGNOSIS — I10 Essential (primary) hypertension: Secondary | ICD-10-CM | POA: Diagnosis not present

## 2017-11-14 DIAGNOSIS — C50912 Malignant neoplasm of unspecified site of left female breast: Secondary | ICD-10-CM | POA: Diagnosis not present

## 2017-11-14 DIAGNOSIS — E118 Type 2 diabetes mellitus with unspecified complications: Secondary | ICD-10-CM | POA: Diagnosis not present

## 2017-11-14 DIAGNOSIS — F0390 Unspecified dementia without behavioral disturbance: Secondary | ICD-10-CM | POA: Diagnosis not present

## 2017-11-14 DIAGNOSIS — C50812 Malignant neoplasm of overlapping sites of left female breast: Secondary | ICD-10-CM | POA: Diagnosis not present

## 2017-11-14 DIAGNOSIS — Z23 Encounter for immunization: Secondary | ICD-10-CM

## 2017-11-14 DIAGNOSIS — Z170 Estrogen receptor positive status [ER+]: Secondary | ICD-10-CM | POA: Diagnosis not present

## 2017-11-14 DIAGNOSIS — C50212 Malignant neoplasm of upper-inner quadrant of left female breast: Secondary | ICD-10-CM | POA: Diagnosis not present

## 2017-11-14 DIAGNOSIS — C50811 Malignant neoplasm of overlapping sites of right female breast: Secondary | ICD-10-CM | POA: Diagnosis not present

## 2017-11-14 DIAGNOSIS — C50911 Malignant neoplasm of unspecified site of right female breast: Secondary | ICD-10-CM | POA: Diagnosis not present

## 2017-11-14 DIAGNOSIS — Z510 Encounter for antineoplastic radiation therapy: Secondary | ICD-10-CM | POA: Diagnosis not present

## 2017-11-14 MED ORDER — LISINOPRIL 20 MG PO TABS: 30.0000 mg | ORAL_TABLET | Freq: Every day | ORAL | 3 refills | Status: DC

## 2017-11-14 NOTE — Progress Notes (Signed)
Subjective:   Charlene Rubio is a 72 y.o. female who presents for Medicare Annual (Subsequent) preventive examination.  Last AWV-12/23/2012    Objective:     Vitals: BP (!) 156/60 (BP Location: Right Arm, Patient Position: Sitting)   Pulse 67   Temp 98.2 F (36.8 C) (Oral)   Ht 5\' 6"  (1.676 m)   Wt 161 lb (73 kg)   LMP  (LMP Unknown)   SpO2 98%   BMI 25.99 kg/m   Body mass index is 25.99 kg/m.  Advanced Directives 11/14/2017 10/10/2017 09/23/2017 09/12/2017 08/27/2017 08/25/2017 05/20/2017  Does Patient Have a Medical Advance Directive? No No No Yes No No Yes  Type of Advance Directive - - - Out of facility DNR (pink MOST or yellow form) - - Out of facility DNR (pink MOST or yellow form)  Does patient want to make changes to medical advance directive? - No - Patient declined No - Patient declined No - Patient declined - - -  Would patient like information on creating a medical advance directive? Yes (MAU/Ambulatory/Procedural Areas - Information given) - - No - Patient declined No - Patient declined No - Patient declined -  Pre-existing out of facility DNR order (yellow form or pink MOST form) - - - Yellow form placed in chart (order not valid for inpatient use) - - Yellow form placed in chart (order not valid for inpatient use)    Tobacco Social History   Tobacco Use  Smoking Status Never Smoker  Smokeless Tobacco Never Used     Counseling given: Not Answered   Clinical Intake:  Pre-visit preparation completed: No  Pain : No/denies pain     Nutritional Risks: None Diabetes: Yes CBG done?: No Did pt. bring in CBG monitor from home?: No  What is the last grade level you completed in school?: HS  Interpreter Needed?: No  Information entered by :: [name], RN  Past Medical History:  Diagnosis Date  . Acute diastolic (congestive) heart failure ([place]) 04/25/2017  . Acute respiratory failure with hypoxia and hypercapnia ([place]) 04/25/2017  . ALLERGIC RHINITIS 07/01/2007     Qualifier: Diagnosis of  By: Sherren Mocha MD, Jory Ee   . Allergy   . Aortic insufficiency   . Aortic valve disorder 07/01/2007   Qualifier: Diagnosis of  By: Sherren Mocha MD, Jory Ee   . Arthritis   . Bilateral leg edema 04/25/2017  . Borderline diabetic   . Cancer [facility])    Breast cancer  . CHF (congestive heart failure) ([place]) 04/25/2017  . Dementia 04/30/2017  . Diabetes mellitus ([place]) 04/25/2017  . DOE (dyspnea on exertion) 04/25/2017  . Essential hypertension 07/01/2007   Qualifier: Diagnosis of  By: Sherren Mocha MD, Jory Ee Glaucoma suspect   . Hypertension   . KNEE PAIN, LEFT 04/06/2010   Qualifier: Diagnosis of  By: Sherren Mocha MD, Jory Ee   . Nonischemic cardiomyopathy ([place])   . Obesity   . Obesity, unspecified 07/01/2007   Qualifier: Diagnosis of  By: Sherren Mocha MD, Jory Ee   . Peripheral vascular disease ([place])    blood clots in legs  . Thyroid disease    Past Surgical History:  Procedure Laterality Date  . CESAREAN SECTION    . MASTECTOMY  08/27/2017   LEFT BREAST MODIFIED RADICAL MASTECTOMY (Left Breast)  . MASTECTOMY  08/27/2017   RIGHT TOTAL MASTECTOMY WITH SENTINEL LYMPH NODE BIOPSY (Right Breast)  . MASTECTOMY W/ SENTINEL NODE BIOPSY Right 08/27/2017   Procedure: RIGHT  TOTAL MASTECTOMY WITH SENTINEL LYMPH NODE BIOPSY;  Surgeon: Fanny Skates, MD;  Location: [facility];  Service: General;  Laterality: Right;  . MODIFIED MASTECTOMY Left 08/27/2017   Procedure: LEFT BREAST MODIFIED RADICAL MASTECTOMY;  Surgeon: Fanny Skates, MD;  Location: [facility];  Service: General;  Laterality: Left;  . PORTACATH PLACEMENT Right 10/09/2017  . PORTACATH PLACEMENT Right 10/09/2017   Procedure: INSERTION PORT-A-CATH WITH ULTRASOUND;  Surgeon: Fanny Skates, MD;  Location: [facility];  Service: General;  Laterality: Right;   Family History  Problem Relation Age of Onset  . Alzheimer's disease Mother 51  . Alzheimer's disease Father 61  . Congestive Heart Failure Father   . Alcoholism Brother   . Alzheimer's  disease Brother   . Dementia Brother   . Alcoholism Brother   . Diabetes Brother    Social History   Socioeconomic History  . Marital status: Married    Spouse name: Not on file  . Number of children: Not on file  . Years of education: Not on file  . Highest education level: Not on file  Occupational History  . Occupation: retired Engineer, structural  . Financial resource strain: Not hard at all  . Food insecurity:    Worry: Never true    Inability: Never true  . Transportation needs:    Medical: No    Non-medical: No  Tobacco Use  . Smoking status: Never Smoker  . Smokeless tobacco: Never Used  Substance and Sexual Activity  . Alcohol use: No  . Drug use: No  . Sexual activity: Not Currently  Lifestyle  . Physical activity:    Days per week: 0 days    Minutes per session: 0 min  . Stress: Only a little  Relationships  . Social connections:    Talks on phone: More than three times a week    Gets together: More than three times a week    Attends religious service: Never    Active member of club or organization: No    Attends meetings of clubs or organizations: Never    Relationship status: Married  Other Topics Concern  . Not on file  Social History Narrative   Social History   Diet?    Do you drink/eat things with caffeine? Yes (minimal) coke   Marital status?          married                          What year were you married? 1968   Do you live in a house, apartment, assisted living, condo, trailer, etc.? house   Is it one or more stories? one   How many persons live in your home? 2   Do you have any pets in your home? (please list) none   Highest level of education completed?   Current or past profession: preschool teacher/nanny   Do you exercise?         0                             Type & how often?   Advanced Directives   Do you have a living will?   Do you have a DNR form?  If not, do you want to discuss one?    Do you have signed POA/HPOA for forms?    Functional Status   Do you have difficulty bathing or dressing yourself?   Do you have difficulty preparing food or eating?    Do you have difficulty managing your medications?   Do you have difficulty managing your finances?   Do you have difficulty affording your medications?    Admitted to [facility] 05/01/17   Never smoked   Alcohol none   DNR       Outpatient Encounter Medications as of 11/14/2017  Medication Sig  . apixaban (ELIQUIS) 5 MG TABS tablet Take 1 tablet (5 mg total) by mouth 2 (two) times daily.  Marland Kitchen aspirin EC 81 MG EC tablet Take 1 tablet (81 mg total) by mouth daily.  . carvedilol (COREG) 6.25 MG tablet Take 1 tablet (6.25 mg total) by mouth 2 (two) times daily with a meal.  . donepezil (ARICEPT) 10 MG tablet Take 1 tablet (10 mg total) by mouth at bedtime.  . furosemide (LASIX) 40 MG tablet Take 1.5 tablets (60 mg total) by mouth daily.  . hyaluronate sodium (RADIAPLEXRX) GEL Apply 1 application topically 2 (two) times daily.  . isosorbide mononitrate (IMDUR) 30 MG 24 hr tablet Take 0.5 tablets (15 mg total) by mouth daily.  Marland Kitchen letrozole (FEMARA) 2.5 MG tablet Take 1 tablet (2.5 mg total) by mouth daily.  Marland Kitchen lidocaine-prilocaine (EMLA) cream Apply to affected area once  . lisinopril (PRINIVIL,ZESTRIL) 20 MG tablet Take 1 tablet (20 mg total) by mouth daily.  . metFORMIN (GLUCOPHAGE-XR) 500 MG 24 hr tablet Take 1 tablet (500 mg total) by mouth 2 (two) times daily with a meal.  . Multiple Vitamins-Minerals (ADULT GUMMY PO) Take 1 tablet by mouth 3 (three) times a week.  . non-metallic deodorant Jethro Poling) MISC Apply 1 application topically daily as needed.   No facility-administered encounter medications on file as of 11/14/2017.     Activities of Daily Living In your present state of health, do you have any difficulty performing the following activities: 11/14/2017 10/09/2017  Hearing? N -  Vision? N -  Difficulty  concentrating or making decisions? Tempie Donning  Walking or climbing stairs? Y N  Dressing or bathing? N -  Doing errands, shopping? Y -  Conservation officer, nature and eating ? N -  Using the Toilet? N -  In the past six months, have you accidently leaked urine? Y -  Comment wears dipends -  Do you have problems with loss of bowel control? Y -  Managing your Medications? Y -  Managing your Finances? Y -  Housekeeping or managing your Housekeeping? Y -  Some recent data might be hidden    Patient Care Team: [name], DO as PCP - General (Internal Medicine)    Assessment:   This is a routine wellness examination for Cr[place].  Exercise Activities and Dietary recommendations Current Exercise Habits: The patient does not participate in regular exercise at present, Exercise limited by: neurologic condition(s)  Goals    None      Fall Risk Fall Risk  11/14/2017 10/10/2017 09/12/2017 09/04/2017 07/30/2017  Falls in the past year? No No No No Yes  Number falls in past yr: - - - - 1  Injury with Fall? - - - - No   Is the patient's home free of loose throw rugs in walkways, pet beds, electrical cords, etc?   yes  Grab bars in the bathroom? yes      Handrails on the stairs?   yes      Adequate lighting?   yes  Depression Screen PHQ 2/9 Scores 11/14/2017 10/10/2017 09/12/2017 07/15/2017  PHQ - 2 Score 0 0 0 0  PHQ- 9 Score - - - -     Cognitive Function MMSE - Mini Mental State Exam 07/30/2017 04/25/2017  Not completed: - Unable to complete  Orientation to time 2 -  Orientation to Place 4 -  Registration 3 -  Attention/ Calculation 5 -  Recall 0 -  Language- name 2 objects 2 -  Language- repeat 1 -  Language- follow 3 step command 3 -  Language- read & follow direction 1 -  Write a sentence 0 -  Copy design 0 -  Total score 21 -        Immunization History  Administered Date(s) Administered  . Influenza Split 08/19/2011  . Influenza Whole 07/01/2007  . Influenza,inj,Quad PF,6+ Mos  05/20/2017  . PPD Test 05/01/2017  . Pneumococcal Conjugate-13 11/14/2017  . Pneumococcal Polysaccharide-23 02/17/2009, 12/23/2012, 04/29/2017  . Td 08/20/1999, 04/06/2010    Qualifies for Shingles Vaccine? Yes, educated and declined for now  Screening Tests Health Maintenance  Topic Date Due  . Hepatitis C Screening  [birth year]  . FOOT EXAM  12/22/1955  . COLONOSCOPY  12/22/1994  . DEXA SCAN  04/19/2018 (Originally 12/21/2009)  . HEMOGLOBIN A1C  04/08/2018  . OPHTHALMOLOGY EXAM  07/04/2018  . MAMMOGRAM  06/14/2019  . TETANUS/TDAP  04/06/2020  . INFLUENZA VACCINE  Completed  . PNA vac Low Risk Adult  Completed    Cancer Screenings: Lung: Low Dose CT Chest recommended if Age 41-80 years, 30 pack-year currently smoking OR have quit w/in 15years. Patient does not qualify. Breast:  Up to date on Mammogram? Yes   Up to date of Bone Density/Dexa? No, ordered Colorectal: due, cologuard info filled out  Additional Screenings:  Hepatitis C Screening: declined PNA 13 given today Hearing screen done today     Plan:    I have personally reviewed and addressed the Medicare Annual Wellness questionnaire and have noted the following in the patient's chart:  A. Medical and social history B. Use of alcohol, tobacco or illicit drugs  C. Current medications and supplements D. Functional ability and status E.  Nutritional status F.  Physical activity G. Advance directives H. List of other physicians I.  Hospitalizations, surgeries, and ER visits in previous 12 months J.  [place] to include hearing, vision, cognitive, depression L. Referrals and appointments - none  In addition, I have reviewed and discussed with patient certain preventive protocols, quality metrics, and best practice recommendations. A written personalized care plan for preventive services as well as general preventive health recommendations were provided to patient.  See attached scanned questionnaire for  additional information.   Signed,   [name], RN Nurse Health Advisor  Patient Concerns: None

## 2017-11-14 NOTE — Patient Instructions (Addendum)
INCREASE LISINOPRIL TO 30MG  (take 1.5 tabs of 20mg  dose) DAILY for blood pressure  Continue other medications as ordered  Follow up with specialists as scheduled  Follow up in 4 mos for DM, HTN, breast CA. Fasting labs prior to visit

## 2017-11-14 NOTE — Progress Notes (Signed)
Location:  [place] OFFICE  Provider: DR Arletha Grippe  Code Status:  Goals of Care:  Advanced Directives 11/14/2017  Does Patient Have a Medical Advance Directive? No  Type of Advance Directive -  Does patient want to make changes to medical advance directive? -  Would patient like information on creating a medical advance directive? Yes (MAU/Ambulatory/Procedural Areas - Information given)  Pre-existing out of facility DNR order (yellow form or pink MOST form) -     Chief Complaint  Patient presents with  . Medical Management of Chronic Issues    2 month follow-up DM and HTN. AWV completed today, health maintenance addressed by Sara,RN    HPI: Patient is a 72 y.o. female seen today for medical management of chronic diseases.  She had AWV today and c/o left hearing loss. She states it began after her breast cancer sx in Jan 2019. She is a poor historian due to dementia. Hx obtained from chart. She c/o unsteady gait following mastectomies  B/l breast cancer - invasive ductal cell and DCIS with (+) left LN mets; She underwent left breast MRM and right total mastectomy with sentinel node bx on 08/27/17. Followed by surgery and H/O (Dr Lindi Adie). She is taking femara. She had port-a-cath placed in 09/2017 and started chemotx in Feb 2019. She also on tx 18 of 31 XRT (Dr Isidore Moos)  HTN - BP suboptimally controlled on lisinopril, coreg, imdur, lasix. She is on eliquis and ASA  Dementia - stable on aricept. Followed by neurology Dr [name]  DM - controlled on metformin. A1c 6.1%. No low BS reactions  Hx LLE DVT - stable on eliquis. No bleeding or easy bruising. She has significant swelling. She does not wear TED stockings on a regular basis   Past Medical History:  Diagnosis Date  . Acute diastolic (congestive) heart failure ([facility]) 04/25/2017  . Acute respiratory failure with hypoxia and hypercapnia ([place]) 04/25/2017  . ALLERGIC RHINITIS 07/01/2007   Qualifier: Diagnosis of  By: Sherren Mocha MD, Jory Ee    . Allergy   . Aortic insufficiency   . Aortic valve disorder 07/01/2007   Qualifier: Diagnosis of  By: Sherren Mocha MD, Jory Ee   . Arthritis   . Bilateral leg edema 04/25/2017  . Borderline diabetic   . Cancer [facility])    Breast cancer  . CHF (congestive heart failure) ([place]) 04/25/2017  . Dementia 04/30/2017  . Diabetes mellitus ([place]) 04/25/2017  . DOE (dyspnea on exertion) 04/25/2017  . Essential hypertension 07/01/2007   Qualifier: Diagnosis of  By: Sherren Mocha MD, Jory Ee Glaucoma suspect   . Hypertension   . KNEE PAIN, LEFT 04/06/2010   Qualifier: Diagnosis of  By: Sherren Mocha MD, Jory Ee   . Nonischemic cardiomyopathy ([place])   . Obesity   . Obesity, unspecified 07/01/2007   Qualifier: Diagnosis of  By: Sherren Mocha MD, Jory Ee   . Peripheral vascular disease ([place])    blood clots in legs  . Thyroid disease     Past Surgical History:  Procedure Laterality Date  . CESAREAN SECTION    . MASTECTOMY  08/27/2017   LEFT BREAST MODIFIED RADICAL MASTECTOMY (Left Breast)  . MASTECTOMY  08/27/2017   RIGHT TOTAL MASTECTOMY WITH SENTINEL LYMPH NODE BIOPSY (Right Breast)  . MASTECTOMY W/ SENTINEL NODE BIOPSY Right 08/27/2017   Procedure: RIGHT TOTAL MASTECTOMY WITH SENTINEL LYMPH NODE BIOPSY;  Surgeon: Fanny Skates, MD;  Location: [place];  Service: General;  Laterality: Right;  . MODIFIED MASTECTOMY Left  08/27/2017   Procedure: LEFT BREAST MODIFIED RADICAL MASTECTOMY;  Surgeon: Fanny Skates, MD;  Location: [address];  Service: General;  Laterality: Left;  . PORTACATH PLACEMENT Right 10/09/2017  . PORTACATH PLACEMENT Right 10/09/2017   Procedure: INSERTION PORT-A-CATH WITH ULTRASOUND;  Surgeon: Fanny Skates, MD;  Location: [place];  Service: General;  Laterality: Right;     reports that she has never smoked. She has never used smokeless tobacco. She reports that she does not drink alcohol or use drugs. Social History   Socioeconomic History  . Marital status: Married    Spouse name: Not on file  . Number of  children: Not on file  . Years of education: Not on file  . Highest education level: Not on file  Occupational History  . Occupation: retired Engineer, structural  . Financial resource strain: Not hard at all  . Food insecurity:    Worry: Never true    Inability: Never true  . Transportation needs:    Medical: No    Non-medical: No  Tobacco Use  . Smoking status: Never Smoker  . Smokeless tobacco: Never Used  Substance and Sexual Activity  . Alcohol use: No  . Drug use: No  . Sexual activity: Not Currently  Lifestyle  . Physical activity:    Days per week: 0 days    Minutes per session: 0 min  . Stress: Only a little  Relationships  . Social connections:    Talks on phone: More than three times a week    Gets together: More than three times a week    Attends religious service: Never    Active member of club or organization: No    Attends meetings of clubs or organizations: Never    Relationship status: Married  . Intimate partner violence:    Fear of current or ex partner: No    Emotionally abused: No    Physically abused: No    Forced sexual activity: No  Other Topics Concern  . Not on file  Social History Narrative   Social History   Diet?    Do you drink/eat things with caffeine? Yes (minimal) coke   Marital status?          married                          What year were you married? 1968   Do you live in a house, apartment, assisted living, condo, trailer, etc.? house   Is it one or more stories? one   How many persons live in your home? 2   Do you have any pets in your home? (please list) none   Highest level of education completed?   Current or past profession: preschool teacher/nanny   Do you exercise?         0                             Type & how often?   Advanced Directives   Do you have a living will?   Do you have a DNR form?                                  If not, do you want to discuss one?   Do you have signed POA/HPOA for forms?     Functional Status  Do you have difficulty bathing or dressing yourself?   Do you have difficulty preparing food or eating?    Do you have difficulty managing your medications?   Do you have difficulty managing your finances?   Do you have difficulty affording your medications?    Admitted to [facility] 05/01/17   Never smoked   Alcohol none   DNR       Family History  Problem Relation Age of Onset  . Alzheimer's disease Mother 56  . Alzheimer's disease Father 92  . Congestive Heart Failure Father   . Alcoholism Brother   . Alzheimer's disease Brother   . Dementia Brother   . Alcoholism Brother   . Diabetes Brother     No Known Allergies  Outpatient Encounter Medications as of 11/14/2017  Medication Sig  . apixaban (ELIQUIS) 5 MG TABS tablet Take 1 tablet (5 mg total) by mouth 2 (two) times daily.  Marland Kitchen aspirin EC 81 MG EC tablet Take 1 tablet (81 mg total) by mouth daily.  . carvedilol (COREG) 6.25 MG tablet Take 1 tablet (6.25 mg total) by mouth 2 (two) times daily with a meal.  . donepezil (ARICEPT) 10 MG tablet Take 1 tablet (10 mg total) by mouth at bedtime.  . furosemide (LASIX) 40 MG tablet Take 1.5 tablets (60 mg total) by mouth daily.  . hyaluronate sodium (RADIAPLEXRX) GEL Apply 1 application topically 2 (two) times daily.  . isosorbide mononitrate (IMDUR) 30 MG 24 hr tablet Take 0.5 tablets (15 mg total) by mouth daily.  Marland Kitchen letrozole (FEMARA) 2.5 MG tablet Take 1 tablet (2.5 mg total) by mouth daily.  Marland Kitchen lidocaine-prilocaine (EMLA) cream Apply to affected area once  . lisinopril (PRINIVIL,ZESTRIL) 20 MG tablet Take 1 tablet (20 mg total) by mouth daily.  . metFORMIN (GLUCOPHAGE-XR) 500 MG 24 hr tablet Take 1 tablet (500 mg total) by mouth 2 (two) times daily with a meal.  . Multiple Vitamins-Minerals (ADULT GUMMY PO) Take 1 tablet by mouth 3 (three) times a week.  . non-metallic deodorant Jethro Poling) MISC Apply 1 application topically daily as needed.   No  facility-administered encounter medications on file as of 11/14/2017.     Review of Systems:  Review of Systems  Unable to perform ROS: Dementia    Health Maintenance  Topic Date Due  . Hepatitis C Screening  [birth year]  . FOOT EXAM  [birth year]1954  . COLONOSCOPY  12/22/1994  . DEXA SCAN  04/19/2018 (Originally 12/21/2009)  . HEMOGLOBIN A1C  04/08/2018  . OPHTHALMOLOGY EXAM  07/04/2018  . MAMMOGRAM  06/14/2019  . TETANUS/TDAP  04/06/2020  . INFLUENZA VACCINE  Completed  . PNA vac Low Risk Adult  Completed    Physical Exam: Vitals:   11/14/17 1120  BP: (!) 156/60  Pulse: 67  Temp: 98.2 F (36.8 C)  TempSrc: Oral  SpO2: 98%  Weight: 161 lb (73 kg)  Height: 5\' 6"  (1.676 m)   Body mass index is 25.99 kg/m. Physical Exam  Constitutional: She appears well-developed and well-nourished.  HENT:  Mouth/Throat: Oropharynx is clear and moist. No oropharyngeal exudate.  TMs intact b/l, dull but no bulging or redness; min external ear canal cerumen without impaction  Eyes: Pupils are equal, round, and reactive to light. No scleral icterus.  Neck: Neck supple. Carotid bruit is not present. No tracheal deviation present. No thyromegaly present.  Cardiovascular: Normal rate, regular rhythm and intact distal pulses. Exam reveals no gallop and no friction rub.  Murmur (  1/6 SEM) heard. +2 pitting LLE edema with palpable cord; trace RLE edema with no palpable cord. No calf TTP b/l  Pulmonary/Chest: Effort normal and breath sounds normal. No stridor. No respiratory distress. She has no wheezes. She has no rales.  Abdominal: Soft. Normal appearance and bowel sounds are normal. She exhibits no distension and no mass. There is no hepatomegaly. There is no tenderness. There is no rigidity, no rebound and no guarding. No hernia.  obese  Musculoskeletal: She exhibits edema.  Lymphadenopathy:    She has no cervical adenopathy.  Neurological: She is alert. Gait (unsteady) abnormal.  Skin: Skin is  warm and dry. No rash noted.  Psychiatric: She has a normal mood and affect. Her behavior is normal. Judgment and thought content normal.    Labs reviewed: Basic Metabolic Panel: Recent Labs    04/26/17 0455  04/28/17 0218  04/30/17 0525  05/09/17  09/12/17 1246 10/09/17 1346 10/14/17 0820  NA 144   < > 140   < > 138   < > 144   < > 145 141 144  K 3.2*   < > 3.6   < > 3.9   < > 3.8   < > 3.6 3.9 3.3*  CL 105   < > 98*   < > 96*  --   --    < > 108 109 107  CO2 28   < > 32   < > 33*  --   --    < > 29 21* 25  GLUCOSE 145*   < > 68   < > 133*  --   --    < > 125 119* 197*  BUN 25*   < > 22*   < > 21*   < > 15   < > 18 14 16   CREATININE 1.11*   < > 0.88   < > 0.89   < > 0.4*   < > 0.66 0.53 0.75  CALCIUM 8.4*   < > 8.0*   < > 8.2*  --   --    < > 9.1 9.1 9.4  MG  --   --  1.8  --  2.0  --   --   --   --   --   --   PHOS  --   --  3.5  --   --   --   --   --   --   --   --   TSH 2.447  --   --   --   --   --  4.03  --   --   --   --    < > = values in this interval not displayed.   Liver Function Tests: Recent Labs    04/30/17 0525 05/09/17 08/25/17 0959 10/14/17 0820  AST 29 16 19 11   ALT 28 17 14 9   ALKPHOS 45 72 71 74  BILITOT 1.3*  --  1.0 0.5  PROT 5.1*  --  6.7 6.5  ALBUMIN 3.0*  --  3.8 3.6   No results for input(s): LIPASE, AMYLASE in the last 8760 hours. Recent Labs    04/30/17 0525  AMMONIA 36*   CBC: Recent Labs    08/25/17 0959 08/27/17 1609 08/28/17 0759 10/09/17 1346 10/14/17 0820  WBC 6.2 11.3* 7.5 5.1 4.0  NEUTROABS 4.3  --   --   --  2.7  HGB 13.7 13.2 11.4* 13.3  --  HCT 42.2 40.2 35.3* 39.6 37.8  MCV 96.1 95.3 97.2 95.0 95.5  PLT 167 170 131* 215 176   Lipid Panel: Recent Labs    05/09/17  CHOL 170  HDL 52  LDLCALC 95  TRIG 117   Lab Results  Component Value Date   HGBA1C 6.1 (H) 10/09/2017    Procedures since last visit: No results found.  Assessment/Plan   ICD-10-CM   1. Essential hypertension I10 lisinopril  (PRINIVIL,ZESTRIL) 20 MG tablet   suboptimally controlled  2. Type 2 diabetes mellitus with complication, without long-term current use of insulin (HCC) E11.8 Lipid Panel    Hemoglobin A1c    Microalbumin/Creatinine Ratio, Urine  3. Bilateral malignant neoplasm of breast in female, unspecified estrogen receptor status, unspecified site of breast ([place]) C50.911    C50.912   4. Dementia without behavioral disturbance, unspecified dementia type F03.90     INCREASE LISINOPRIL TO 30MG  (take 1.5 tabs of 20mg  dose) DAILY for blood pressure  Continue other medications as ordered  Follow up with specialists as scheduled  Follow up in 4 mos for DM, HTN, breast CA. Fasting labs prior to visit    Gerlach S. Perlie Gold  [facility] [address] [place], [place] [address] [phone] Cell (Monday-Friday 8 AM - 5 PM) [phone] After 5 PM and follow prompts

## 2017-11-14 NOTE — Patient Instructions (Addendum)
Charlene Rubio , Thank you for taking time to come for your Medicare Wellness Visit. I appreciate your ongoing commitment to your health goals. Please review the following plan we discussed and let me know if I can assist you in the future.   Screening recommendations/referrals: Colonoscopy due-colorguard information filled out today Mammogram excluded Bone Density due, declined for now Recommended yearly ophthalmology/optometry visit for glaucoma screening and checkup Recommended yearly dental visit for hygiene and checkup  Vaccinations: Influenza vaccine up to date, due 2019 fall season Pneumococcal vaccine 13 due, given today. Completed Tdap vaccine up to date, due 04/06/2020 Shingles vaccine due, declined for now    Advanced directives: Please bring [place] a copy of your living will and health care power of attorney  Conditions/risks identified: none  Next appointment: Charlene Dense, RN 11/20/2018 @ 73:45pm   Preventive Care 73 Years and Older, Female Preventive care refers to lifestyle choices and visits with your health care provider that can promote health and wellness. What does preventive care include?  A yearly physical exam. This is also called an annual well check.  Dental exams once or twice a year.  Routine eye exams. Ask your health care provider how often you should have your eyes checked.  Personal lifestyle choices, including:  Daily care of your teeth and gums.  Regular physical activity.  Eating a healthy diet.  Avoiding tobacco and drug use.  Limiting alcohol use.  Practicing safe sex.  Taking low-dose aspirin every day.  Taking vitamin and mineral supplements as recommended by your health care provider. What happens during an annual well check? The services and screenings done by your health care provider during your annual well check will depend on your age, overall health, lifestyle risk factors, and family history of disease. Counseling  Your health  care provider may ask you questions about your:  Alcohol use.  Tobacco use.  Drug use.  Emotional well-being.  Home and relationship well-being.  Sexual activity.  Eating habits.  History of falls.  Memory and ability to understand (cognition).  Work and work Statistician.  Reproductive health. Screening  You may have the following tests or measurements:  Height, weight, and BMI.  Blood pressure.  Lipid and cholesterol levels. These may be checked every 5 years, or more frequently if you are over 30 years old.  Skin check.  Lung cancer screening. You may have this screening every year starting at age 73 if you have a 30-pack-year history of smoking and currently smoke or have quit within the past 15 years.  Fecal occult blood test (FOBT) of the stool. You may have this test every year starting at age 73.  Flexible sigmoidoscopy or colonoscopy. You may have a sigmoidoscopy every 5 years or a colonoscopy every 10 years starting at age 73.  Hepatitis C blood test.  Hepatitis B blood test.  Sexually transmitted disease (STD) testing.  Diabetes screening. This is done by checking your blood sugar (glucose) after you have not eaten for a while (fasting). You may have this done every 1-3 years.  Bone density scan. This is done to screen for osteoporosis. You may have this done starting at age 73.  Mammogram. This may be done every 1-2 years. Talk to your health care provider about how often you should have regular mammograms. Talk with your health care provider about your test results, treatment options, and if necessary, the need for more tests. Vaccines  Your health care provider may recommend certain vaccines, such as:  Influenza vaccine. This is recommended every year.  Tetanus, diphtheria, and acellular pertussis (Tdap, Td) vaccine. You may need a Td booster every 10 years.  Zoster vaccine. You may need this after age 19.  Pneumococcal 13-valent conjugate  (PCV13) vaccine. One dose is recommended after age 73.  Pneumococcal polysaccharide (PPSV23) vaccine. One dose is recommended after age 73. Talk to your health care provider about which screenings and vaccines you need and how often you need them. This information is not intended to replace advice given to you by your health care provider. Make sure you discuss any questions you have with your health care provider. Document Released: 09/01/2015 Document Revised: 04/24/2016 Document Reviewed: 06/06/2015 Elsevier Interactive Patient Education  2017 [employer] Prevention in the Home Falls can cause injuries. They can happen to people of all ages. There are many things you can do to make your home safe and to help prevent falls. What can I do on the outside of my home?  Regularly fix the edges of walkways and driveways and fix any cracks.  Remove anything that might make you trip as you walk through a door, such as a raised step or threshold.  Trim any bushes or trees on the path to your home.  Use bright outdoor lighting.  Clear any walking paths of anything that might make someone trip, such as rocks or tools.  Regularly check to see if handrails are loose or broken. Make sure that both sides of any steps have handrails.  Any raised decks and porches should have guardrails on the edges.  Have any leaves, snow, or ice cleared regularly.  Use sand or salt on walking paths during winter.  Clean up any spills in your garage right away. This includes oil or grease spills. What can I do in the bathroom?  Use night lights.  Install grab bars by the toilet and in the tub and shower. Do not use towel bars as grab bars.  Use non-skid mats or decals in the tub or shower.  If you need to sit down in the shower, use a plastic, non-slip stool.  Keep the floor dry. Clean up any water that spills on the floor as soon as it happens.  Remove soap buildup in the tub or shower  regularly.  Attach bath mats securely with double-sided non-slip rug tape.  Do not have throw rugs and other things on the floor that can make you trip. What can I do in the bedroom?  Use night lights.  Make sure that you have a light by your bed that is easy to reach.  Do not use any sheets or blankets that are too big for your bed. They should not hang down onto the floor.  Have a firm chair that has side arms. You can use this for support while you get dressed.  Do not have throw rugs and other things on the floor that can make you trip. What can I do in the kitchen?  Clean up any spills right away.  Avoid walking on wet floors.  Keep items that you use a lot in easy-to-reach places.  If you need to reach something above you, use a strong step stool that has a grab bar.  Keep electrical cords out of the way.  Do not use floor polish or wax that makes floors slippery. If you must use wax, use non-skid floor wax.  Do not have throw rugs and other things on the floor that  can make you trip. What can I do with my stairs?  Do not leave any items on the stairs.  Make sure that there are handrails on both sides of the stairs and use them. Fix handrails that are broken or loose. Make sure that handrails are as long as the stairways.  Check any carpeting to make sure that it is firmly attached to the stairs. Fix any carpet that is loose or worn.  Avoid having throw rugs at the top or bottom of the stairs. If you do have throw rugs, attach them to the floor with carpet tape.  Make sure that you have a light switch at the top of the stairs and the bottom of the stairs. If you do not have them, ask someone to add them for you. What else can I do to help prevent falls?  Wear shoes that:  Do not have high heels.  Have rubber bottoms.  Are comfortable and fit you well.  Are closed at the toe. Do not wear sandals.  If you use a stepladder:  Make sure that it is fully  opened. Do not climb a closed stepladder.  Make sure that both sides of the stepladder are locked into place.  Ask someone to hold it for you, if possible.  Clearly mark and make sure that you can see:  Any grab bars or handrails.  First and last steps.  Where the edge of each step is.  Use tools that help you move around (mobility aids) if they are needed. These include:  Canes.  Walkers.  Scooters.  Crutches.  Turn on the lights when you go into a dark area. Replace any light bulbs as soon as they burn out.  Set up your furniture so you have a clear path. Avoid moving your furniture around.  If any of your floors are uneven, fix them.  If there are any pets around you, be aware of where they are.  Review your medicines with your doctor. Some medicines can make you feel dizzy. This can increase your chance of falling. Ask your doctor what other things that you can do to help prevent falls. This information is not intended to replace advice given to you by your health care provider. Make sure you discuss any questions you have with your health care provider. Document Released: 06/01/2009 Document Revised: 01/11/2016 Document Reviewed: 09/09/2014 Elsevier Interactive Patient Education  2017 [employer].

## 2017-11-17 ENCOUNTER — Ambulatory Visit
Admission: RE | Admit: 2017-11-17 | Discharge: 2017-11-17 | Disposition: A | Discharge status: Home / Self Care | Payer: Medicare Other | Source: Ambulatory Visit | Attending: Radiation Oncology | Admitting: Radiation Oncology

## 2017-11-17 DIAGNOSIS — C50811 Malignant neoplasm of overlapping sites of right female breast: Secondary | ICD-10-CM | POA: Insufficient documentation

## 2017-11-17 DIAGNOSIS — C50812 Malignant neoplasm of overlapping sites of left female breast: Secondary | ICD-10-CM | POA: Insufficient documentation

## 2017-11-17 DIAGNOSIS — C50212 Malignant neoplasm of upper-inner quadrant of left female breast: Secondary | ICD-10-CM | POA: Diagnosis not present

## 2017-11-17 DIAGNOSIS — Z510 Encounter for antineoplastic radiation therapy: Secondary | ICD-10-CM | POA: Insufficient documentation

## 2017-11-18 ENCOUNTER — Ambulatory Visit: Payer: Medicare Other | Admitting: Neurology

## 2017-11-18 ENCOUNTER — Ambulatory Visit
Admission: RE | Admit: 2017-11-18 | Discharge: 2017-11-18 | Disposition: A | Discharge status: Home / Self Care | Payer: Medicare Other | Source: Ambulatory Visit | Attending: Radiation Oncology | Admitting: Radiation Oncology

## 2017-11-18 DIAGNOSIS — Z510 Encounter for antineoplastic radiation therapy: Secondary | ICD-10-CM | POA: Diagnosis not present

## 2017-11-18 DIAGNOSIS — C50212 Malignant neoplasm of upper-inner quadrant of left female breast: Secondary | ICD-10-CM | POA: Diagnosis not present

## 2017-11-18 DIAGNOSIS — C50812 Malignant neoplasm of overlapping sites of left female breast: Secondary | ICD-10-CM | POA: Diagnosis not present

## 2017-11-18 DIAGNOSIS — C50811 Malignant neoplasm of overlapping sites of right female breast: Secondary | ICD-10-CM | POA: Diagnosis not present

## 2017-11-19 ENCOUNTER — Ambulatory Visit
Admission: RE | Admit: 2017-11-19 | Discharge: 2017-11-19 | Disposition: A | Discharge status: Home / Self Care | Payer: Medicare Other | Source: Ambulatory Visit | Attending: Radiation Oncology | Admitting: Radiation Oncology

## 2017-11-19 ENCOUNTER — Encounter: Payer: Self-pay | Admitting: Hematology and Oncology

## 2017-11-19 DIAGNOSIS — C50811 Malignant neoplasm of overlapping sites of right female breast: Secondary | ICD-10-CM | POA: Diagnosis not present

## 2017-11-19 DIAGNOSIS — C50812 Malignant neoplasm of overlapping sites of left female breast: Secondary | ICD-10-CM | POA: Diagnosis not present

## 2017-11-19 DIAGNOSIS — Z510 Encounter for antineoplastic radiation therapy: Secondary | ICD-10-CM | POA: Diagnosis not present

## 2017-11-19 DIAGNOSIS — C50212 Malignant neoplasm of upper-inner quadrant of left female breast: Secondary | ICD-10-CM | POA: Diagnosis not present

## 2017-11-19 NOTE — Progress Notes (Signed)
Pt is approved for the $1000 [employer] grant.  

## 2017-11-20 ENCOUNTER — Ambulatory Visit
Admission: RE | Admit: 2017-11-20 | Discharge: 2017-11-20 | Disposition: A | Discharge status: Home / Self Care | Payer: Medicare Other | Source: Ambulatory Visit | Attending: Radiation Oncology | Admitting: Radiation Oncology

## 2017-11-20 DIAGNOSIS — C50811 Malignant neoplasm of overlapping sites of right female breast: Secondary | ICD-10-CM | POA: Diagnosis not present

## 2017-11-20 DIAGNOSIS — C50812 Malignant neoplasm of overlapping sites of left female breast: Secondary | ICD-10-CM | POA: Diagnosis not present

## 2017-11-20 DIAGNOSIS — Z510 Encounter for antineoplastic radiation therapy: Secondary | ICD-10-CM | POA: Diagnosis not present

## 2017-11-20 DIAGNOSIS — C50212 Malignant neoplasm of upper-inner quadrant of left female breast: Secondary | ICD-10-CM | POA: Diagnosis not present

## 2017-11-21 ENCOUNTER — Ambulatory Visit
Admission: RE | Admit: 2017-11-21 | Discharge: 2017-11-21 | Disposition: A | Discharge status: Home / Self Care | Payer: Medicare Other | Source: Ambulatory Visit | Attending: Radiation Oncology | Admitting: Radiation Oncology

## 2017-11-21 DIAGNOSIS — C50811 Malignant neoplasm of overlapping sites of right female breast: Secondary | ICD-10-CM | POA: Diagnosis not present

## 2017-11-21 DIAGNOSIS — C50212 Malignant neoplasm of upper-inner quadrant of left female breast: Secondary | ICD-10-CM | POA: Diagnosis not present

## 2017-11-21 DIAGNOSIS — C50812 Malignant neoplasm of overlapping sites of left female breast: Secondary | ICD-10-CM | POA: Diagnosis not present

## 2017-11-21 DIAGNOSIS — Z510 Encounter for antineoplastic radiation therapy: Secondary | ICD-10-CM | POA: Diagnosis not present

## 2017-11-24 ENCOUNTER — Ambulatory Visit
Admission: RE | Admit: 2017-11-24 | Discharge: 2017-11-24 | Disposition: A | Discharge status: Home / Self Care | Payer: Medicare Other | Source: Ambulatory Visit | Attending: Radiation Oncology | Admitting: Radiation Oncology

## 2017-11-24 DIAGNOSIS — C50212 Malignant neoplasm of upper-inner quadrant of left female breast: Secondary | ICD-10-CM | POA: Diagnosis not present

## 2017-11-24 DIAGNOSIS — C50811 Malignant neoplasm of overlapping sites of right female breast: Secondary | ICD-10-CM | POA: Diagnosis not present

## 2017-11-24 DIAGNOSIS — Z510 Encounter for antineoplastic radiation therapy: Secondary | ICD-10-CM | POA: Diagnosis not present

## 2017-11-24 DIAGNOSIS — C50812 Malignant neoplasm of overlapping sites of left female breast: Secondary | ICD-10-CM | POA: Diagnosis not present

## 2017-11-24 NOTE — Assessment & Plan Note (Signed)
Right simple mastectomy: IDC grade 1, 1.1 cm, DCIS low-grade, margins negative, 0/2 lymph nodes negative; ER 95%, PR 95%, HER-2 negative Ki-67 15% T1 cN0 stage I a left simple mastectomy: IDC grade 3, 6.2 cm, 4/13 lymph nodes positive, margins negative, ER 5 %, PR 0%, HER-2 positive ratio 2.04, Ki-67 50 % T3 N2 stage IIIa Echocardiogram 09/24/2017: EF 50%   Recommendation: 1. Adjuvant Herceptin started 10/14/17 2.  Adjuvant radiation therapy started 10/23/17-11/25/17 3. adjuvant antiestrogen therapy --------------------------------------------------  Herceptin Toxicities: Tolerating it well  Anti estrogen therapy with Letrozole

## 2017-11-25 ENCOUNTER — Other Ambulatory Visit: Payer: Medicare Other

## 2017-11-25 ENCOUNTER — Inpatient Hospital Stay: Payer: Medicare Other

## 2017-11-25 ENCOUNTER — Ambulatory Visit: Payer: Medicare Other | Admitting: Hematology and Oncology

## 2017-11-25 ENCOUNTER — Inpatient Hospital Stay (HOSPITAL_BASED_OUTPATIENT_CLINIC_OR_DEPARTMENT_OTHER): Payer: Medicare Other | Admitting: Hematology and Oncology

## 2017-11-25 ENCOUNTER — Ambulatory Visit: Payer: Medicare Other

## 2017-11-25 ENCOUNTER — Inpatient Hospital Stay: Payer: Medicare Other | Attending: Hematology and Oncology

## 2017-11-25 ENCOUNTER — Other Ambulatory Visit: Payer: Self-pay

## 2017-11-25 ENCOUNTER — Ambulatory Visit
Admission: RE | Admit: 2017-11-25 | Discharge: 2017-11-25 | Disposition: A | Discharge status: Home / Self Care | Payer: Medicare Other | Source: Ambulatory Visit | Attending: Radiation Oncology | Admitting: Radiation Oncology

## 2017-11-25 DIAGNOSIS — C50811 Malignant neoplasm of overlapping sites of right female breast: Secondary | ICD-10-CM | POA: Diagnosis not present

## 2017-11-25 DIAGNOSIS — C50812 Malignant neoplasm of overlapping sites of left female breast: Secondary | ICD-10-CM

## 2017-11-25 DIAGNOSIS — C50212 Malignant neoplasm of upper-inner quadrant of left female breast: Secondary | ICD-10-CM | POA: Diagnosis not present

## 2017-11-25 DIAGNOSIS — Z5112 Encounter for antineoplastic immunotherapy: Secondary | ICD-10-CM | POA: Diagnosis not present

## 2017-11-25 DIAGNOSIS — Z170 Estrogen receptor positive status [ER+]: Secondary | ICD-10-CM

## 2017-11-25 DIAGNOSIS — L598 Other specified disorders of the skin and subcutaneous tissue related to radiation: Secondary | ICD-10-CM

## 2017-11-25 DIAGNOSIS — Z95828 Presence of other vascular implants and grafts: Secondary | ICD-10-CM

## 2017-11-25 DIAGNOSIS — Z510 Encounter for antineoplastic radiation therapy: Secondary | ICD-10-CM | POA: Diagnosis not present

## 2017-11-25 DIAGNOSIS — C50911 Malignant neoplasm of unspecified site of right female breast: Secondary | ICD-10-CM | POA: Diagnosis not present

## 2017-11-25 LAB — CMP (CANCER CENTER ONLY)
ALBUMIN: 3.5 g/dL (ref 3.5–5.0)
ALT: 11 U/L (ref 0–55)
ANION GAP: 8 (ref 3–11)
AST: 13 U/L (ref 5–34)
Alkaline Phosphatase: 73 U/L (ref 40–150)
BILIRUBIN TOTAL: 0.4 mg/dL (ref 0.2–1.2)
BUN: 17 mg/dL (ref 7–26)
CHLORIDE: 110 mmol/L — AB (ref 98–109)
CO2: 25 mmol/L (ref 22–29)
Calcium: 9.1 mg/dL (ref 8.4–10.4)
Creatinine: 0.7 mg/dL (ref 0.60–1.10)
GFR, Est AFR Am: >60 mL/min (ref >60)
GLUCOSE: 132 mg/dL (ref 70–140)
POTASSIUM: 3.9 mmol/L (ref 3.5–5.1)
Sodium: 143 mmol/L (ref 136–145)
TOTAL PROTEIN: 6.1 g/dL — AB (ref 6.4–8.3)

## 2017-11-25 LAB — CBC WITH DIFFERENTIAL (CANCER CENTER ONLY)
BASOS ABS: 0.1 10*3/uL (ref 0.0–0.1)
Basophils Relative: 1 %
Eosinophils Absolute: 0.2 10*3/uL (ref 0.0–0.5)
Eosinophils Relative: 5 %
HCT: 37 % (ref 34.8–46.6)
Hemoglobin: 12.1 g/dL (ref 11.6–15.9)
LYMPHS ABS: 0.5 10*3/uL — AB (ref 0.9–3.3)
LYMPHS PCT: 11 %
MCH: 31.5 pg (ref 25.1–34.0)
MCHC: 32.7 g/dL (ref 31.5–36.0)
MCV: 96.4 fL (ref 79.5–101.0)
Monocytes Absolute: 0.3 10*3/uL (ref 0.1–0.9)
Monocytes Relative: 8 %
NEUTROS ABS: 3.4 10*3/uL (ref 1.5–6.5)
Neutrophils Relative %: 75 %
Platelet Count: 151 10*3/uL (ref 145–400)
RBC: 3.84 MIL/uL (ref 3.70–5.45)
RDW: 14 % (ref 11.2–14.5)
WBC: 4.6 10*3/uL (ref 3.9–10.3)

## 2017-11-25 MED ORDER — DIPHENHYDRAMINE HCL 25 MG PO CAPS
50.0000 mg | ORAL_CAPSULE | Freq: Once | ORAL | Status: AC
  Administered 2017-11-25: 50 mg via ORAL

## 2017-11-25 MED ORDER — SODIUM CHLORIDE 0.9% FLUSH
10.0000 mL | INTRAVENOUS | Status: DC | PRN
  Administered 2017-11-25: 10 mL
  Filled 2017-11-25: qty 10

## 2017-11-25 MED ORDER — ACETAMINOPHEN 325 MG PO TABS
650.0000 mg | ORAL_TABLET | Freq: Once | ORAL | Status: AC
  Administered 2017-11-25: 650 mg via ORAL

## 2017-11-25 MED ORDER — DIPHENHYDRAMINE HCL 25 MG PO CAPS
ORAL_CAPSULE | ORAL | Status: AC
  Filled 2017-11-25: qty 2

## 2017-11-25 MED ORDER — ACETAMINOPHEN 325 MG PO TABS
ORAL_TABLET | ORAL | Status: AC
  Filled 2017-11-25: qty 2

## 2017-11-25 MED ORDER — SODIUM CHLORIDE 0.9 % IV SOLN
Freq: Once | INTRAVENOUS | Status: AC
  Administered 2017-11-25: 13:00:00 via INTRAVENOUS

## 2017-11-25 MED ORDER — TRASTUZUMAB CHEMO 150 MG IV SOLR
450.0000 mg | Freq: Once | INTRAVENOUS | Status: AC
  Administered 2017-11-25: 450 mg via INTRAVENOUS
  Filled 2017-11-25: qty 21.43

## 2017-11-25 MED ORDER — HEPARIN SOD (PORK) LOCK FLUSH 100 UNIT/ML IV SOLN
500.0000 [IU] | Freq: Once | INTRAVENOUS | Status: AC | PRN
  Administered 2017-11-25: 500 [IU]
  Filled 2017-11-25: qty 5

## 2017-11-25 NOTE — Patient Instructions (Signed)
[facility] Discharge Instructions for Patients Receiving Chemotherapy  Today you received the following chemotherapy agents: Trastuzumab (Herceptin).  To help prevent nausea and vomiting after your treatment, we encourage you to take your nausea medication as prescribed. If you develop nausea and vomiting that is not controlled by your nausea medication, call the clinic.   BELOW ARE SYMPTOMS THAT SHOULD BE REPORTED IMMEDIATELY:  *FEVER GREATER THAN 100.5 F  *CHILLS WITH OR WITHOUT FEVER  NAUSEA AND VOMITING THAT IS NOT CONTROLLED WITH YOUR NAUSEA MEDICATION  *UNUSUAL SHORTNESS OF BREATH  *UNUSUAL BRUISING OR BLEEDING  TENDERNESS IN MOUTH AND THROAT WITH OR WITHOUT PRESENCE OF ULCERS  *URINARY PROBLEMS  *BOWEL PROBLEMS  UNUSUAL RASH Items with * indicate a potential emergency and should be followed up as soon as possible.  Feel free to call the clinic should you have any questions or concerns. The clinic phone number is [phone].  Please show the CHEMO ALERT CARD at check-in to the Emergency Department and triage nurse.   

## 2017-11-25 NOTE — Progress Notes (Signed)
Patient Care Team: [name], DO as PCP - General (Internal Medicine)  DIAGNOSIS:  Encounter Diagnosis  Name Primary?  . 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, estrogen receptor positive ([place])     SUMMARY OF ONCOLOGIC HISTORY:   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, estrogen receptor positive ([place])   06/23/2017 Initial Diagnosis    Left breast 11 o'clock position 6.3 x 4.2 x 6.1 cm mass with thick papillary projections, 2 adjacent left axillary lymph nodes; right breast 930 position 1.7 cm mass; left breast and left lymph node biopsy revealed IDC grade 3, ER 5%, PR 0%, Ki-67 50%, HER-2 positive ratio 2.04 T3N1 stage IIIa; right breast: Grade 1 IDC  ER 95%, PR 95%, Ki-67 15%, HER-2 negative; T1CN0 stage I a      08/27/2017 Surgery    Right simple mastectomy: IDC grade 1, 1.1 cm, DCIS low-grade, margins negative, 0/2 lymph nodes negative; ER 95%, PR 95%, HER-2 negative Ki-67 15% T1 cN0 stage I a left simple mastectomy: IDC grade 3, 6.2 cm, 4/13 lymph nodes positive, margins negative, ER 5 %, PR 0%, HER-2 positive ratio 2.04, Ki-67 50 % T3 N2 stage IIIa       10/14/2017 -  Chemotherapy    Adjuvant Herceptin alone for 1 year       10/23/2017 - 11/25/2017 Radiation Therapy    Adjuvant radiation therapy       CHIEF COMPLIANT: Follow-up on Herceptin and anastrozole  INTERVAL HISTORY: Charlene Rubio is a 72 year old with above-mentioned history of bilateral breast cancers who underwent bilateral mastectomies and is currently on adjuvant radiation.  She is also receiving adjuvant Herceptin along with letrozole.  She has no side effects from either the Herceptin on letrozole. She is experiencing some radiation dermatitis.  REVIEW OF SYSTEMS:   Constitutional: Denies fevers, chills or abnormal weight loss Eyes: Denies blurriness of vision Ears, nose, mouth, throat, and face: Denies mucositis or sore throat Respiratory: Denies  cough, dyspnea or wheezes Cardiovascular: Denies palpitation, chest discomfort Gastrointestinal:  Denies nausea, heartburn or change in bowel habits Skin: Denies abnormal skin rashes Lymphatics: Denies new lymphadenopathy or easy bruising Neurological:Denies numbness, tingling or new weaknesses Behavioral/Psych: Mood is stable, no new changes  Extremities: No lower extremity edema Breast: Radiation dermatitis All other systems were reviewed with the patient and are negative.  I have reviewed the past medical history, past surgical history, social history and family history with the patient and they are unchanged from previous note.  ALLERGIES:  has No Known Allergies.  MEDICATIONS:  Current Outpatient Medications  Medication Sig Dispense Refill  . apixaban (ELIQUIS) 5 MG TABS tablet Take 1 tablet (5 mg total) by mouth 2 (two) times daily. 60 tablet 5  . aspirin EC 81 MG EC tablet Take 1 tablet (81 mg total) by mouth daily.    . carvedilol (COREG) 6.25 MG tablet Take 1 tablet (6.25 mg total) by mouth 2 (two) times daily with a meal. 180 tablet 3  . donepezil (ARICEPT) 10 MG tablet Take 1 tablet (10 mg total) by mouth at bedtime. 30 tablet 6  . furosemide (LASIX) 40 MG tablet Take 1.5 tablets (60 mg total) by mouth daily. 145 tablet 3  . hyaluronate sodium (RADIAPLEXRX) GEL Apply 1 application topically 2 (two) times daily.    . isosorbide mononitrate (IMDUR) 30 MG 24 hr tablet Take 0.5 tablets (15 mg total) by mouth daily. 45 tablet 3  . letrozole (  FEMARA) 2.5 MG tablet Take 1 tablet (2.5 mg total) by mouth daily. 90 tablet 3  . lidocaine-prilocaine (EMLA) cream Apply to affected area once 30 g 3  . lisinopril (PRINIVIL,ZESTRIL) 20 MG tablet Take 1.5 tablets (30 mg total) by mouth daily. 135 tablet 3  . metFORMIN (GLUCOPHAGE-XR) 500 MG 24 hr tablet Take 1 tablet (500 mg total) by mouth 2 (two) times daily with a meal. 180 tablet 1  . Multiple Vitamins-Minerals (ADULT GUMMY PO) Take 1  tablet by mouth 3 (three) times a week.    . non-metallic deodorant Jethro Poling) MISC Apply 1 application topically daily as needed.     No current facility-administered medications for this visit.     PHYSICAL EXAMINATION: ECOG PERFORMANCE STATUS: 1 - Symptomatic but completely ambulatory  Vitals:   11/25/17 1110  BP: (!) 132/96  Pulse: (!) 58  Resp: 16  Temp: 98.3 F (36.8 C)  SpO2: 98%   Filed Weights   11/25/17 1110  Weight: 161 lb (73 kg)    GENERAL:alert, no distress and comfortable SKIN: skin color, texture, turgor are normal, no rashes or significant lesions EYES: normal, Conjunctiva are pink and non-injected, sclera clear OROPHARYNX:no exudate, no erythema and lips, buccal mucosa, and tongue normal  NECK: supple, thyroid normal size, non-tender, without nodularity LYMPH:  no palpable lymphadenopathy in the cervical, axillary or inguinal LUNGS: clear to auscultation and percussion with normal breathing effort HEART: regular rate & rhythm and no murmurs and no lower extremity edema ABDOMEN:abdomen soft, non-tender and normal bowel sounds MUSCULOSKELETAL:no cyanosis of digits and no clubbing  NEURO: alert & oriented x 3 with fluent speech, no focal motor/sensory deficits EXTREMITIES: No lower extremity edema  LABORATORY DATA:  I have reviewed the data as listed CMP Latest Ref Rng & Units 10/14/2017 10/09/2017 09/12/2017  Glucose 70 - 140 mg/dL 197(H) 119(H) 125  BUN 7 - 26 mg/dL '16 14 18  ' Creatinine 0.60 - 1.10 mg/dL 0.75 0.53 0.66  Sodium 136 - 145 mmol/L 144 141 145  Potassium 3.5 - 5.1 mmol/L 3.3(L) 3.9 3.6  Chloride 98 - 109 mmol/L 107 109 108  CO2 22 - 29 mmol/L 25 21(L) 29  Calcium 8.4 - 10.4 mg/dL 9.4 9.1 9.1  Total Protein 6.4 - 8.3 g/dL 6.5 - -  Total Bilirubin 0.2 - 1.2 mg/dL 0.5 - -  Alkaline Phos 40 - 150 U/L 74 - -  AST 5 - 34 U/L 11 - -  ALT 0 - 55 U/L 9 - -    Lab Results  Component Value Date   WBC 4.6 11/25/2017   HGB 13.3 10/09/2017   HCT 37.0  11/25/2017   MCV 96.4 11/25/2017   PLT 151 11/25/2017   NEUTROABS 3.4 11/25/2017    ASSESSMENT & PLAN:  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, estrogen receptor positive ([place]) Right simple mastectomy: IDC grade 1, 1.1 cm, DCIS low-grade, margins negative, 0/2 lymph nodes negative; ER 95%, PR 95%, HER-2 negative Ki-67 15% T1 cN0 stage I a left simple mastectomy: IDC grade 3, 6.2 cm, 4/13 lymph nodes positive, margins negative, ER 5 %, PR 0%, HER-2 positive ratio 2.04, Ki-67 50 % T3 N2 stage IIIa Echocardiogram 09/24/2017: EF 50%   Recommendation: 1. Adjuvant Herceptin started 10/14/17 2.  Adjuvant radiation therapy started 10/23/17-11/25/17 3. adjuvant antiestrogen therapy started February 2019 --------------------------------------------------  Herceptin Toxicities: Tolerating it well  Anti estrogen therapy with Letrozole she is tolerating it extremely well. Denies any hot flashes or  arthralgias or myalgias.  Return to clinic every 3 weeks for Herceptin every 6 weeks for follow-up with me.   No orders of the defined types were placed in this encounter.  The patient has a good understanding of the overall plan. she agrees with it. she will call with any problems that may develop before the next visit here.   Harriette Ohara, MD 11/25/17

## 2017-11-26 ENCOUNTER — Ambulatory Visit
Admission: RE | Admit: 2017-11-26 | Discharge: 2017-11-26 | Disposition: A | Discharge status: Home / Self Care | Payer: Medicare Other | Source: Ambulatory Visit | Attending: Radiation Oncology | Admitting: Radiation Oncology

## 2017-11-26 DIAGNOSIS — C50812 Malignant neoplasm of overlapping sites of left female breast: Secondary | ICD-10-CM | POA: Diagnosis not present

## 2017-11-26 DIAGNOSIS — Z510 Encounter for antineoplastic radiation therapy: Secondary | ICD-10-CM | POA: Diagnosis not present

## 2017-11-26 DIAGNOSIS — C50811 Malignant neoplasm of overlapping sites of right female breast: Secondary | ICD-10-CM | POA: Diagnosis not present

## 2017-11-26 DIAGNOSIS — C50212 Malignant neoplasm of upper-inner quadrant of left female breast: Secondary | ICD-10-CM | POA: Diagnosis not present

## 2017-11-27 ENCOUNTER — Ambulatory Visit
Admission: RE | Admit: 2017-11-27 | Discharge: 2017-11-27 | Disposition: A | Discharge status: Home / Self Care | Payer: Medicare Other | Source: Ambulatory Visit | Attending: Radiation Oncology | Admitting: Radiation Oncology

## 2017-11-27 DIAGNOSIS — C50812 Malignant neoplasm of overlapping sites of left female breast: Secondary | ICD-10-CM | POA: Diagnosis not present

## 2017-11-27 DIAGNOSIS — Z510 Encounter for antineoplastic radiation therapy: Secondary | ICD-10-CM | POA: Diagnosis not present

## 2017-11-27 DIAGNOSIS — C50811 Malignant neoplasm of overlapping sites of right female breast: Secondary | ICD-10-CM | POA: Diagnosis not present

## 2017-11-28 ENCOUNTER — Ambulatory Visit
Admission: RE | Admit: 2017-11-28 | Discharge: 2017-11-28 | Disposition: A | Discharge status: Home / Self Care | Payer: Medicare Other | Source: Ambulatory Visit | Attending: Radiation Oncology | Admitting: Radiation Oncology

## 2017-11-28 DIAGNOSIS — C50811 Malignant neoplasm of overlapping sites of right female breast: Secondary | ICD-10-CM | POA: Diagnosis not present

## 2017-11-28 DIAGNOSIS — Z510 Encounter for antineoplastic radiation therapy: Secondary | ICD-10-CM | POA: Diagnosis not present

## 2017-11-28 DIAGNOSIS — C50812 Malignant neoplasm of overlapping sites of left female breast: Secondary | ICD-10-CM | POA: Diagnosis not present

## 2017-12-01 ENCOUNTER — Ambulatory Visit
Admission: RE | Admit: 2017-12-01 | Discharge: 2017-12-01 | Disposition: A | Discharge status: Home / Self Care | Payer: Medicare Other | Source: Ambulatory Visit | Attending: Radiation Oncology | Admitting: Radiation Oncology

## 2017-12-01 DIAGNOSIS — C50811 Malignant neoplasm of overlapping sites of right female breast: Secondary | ICD-10-CM | POA: Diagnosis not present

## 2017-12-01 DIAGNOSIS — Z510 Encounter for antineoplastic radiation therapy: Secondary | ICD-10-CM | POA: Diagnosis not present

## 2017-12-01 DIAGNOSIS — C50812 Malignant neoplasm of overlapping sites of left female breast: Secondary | ICD-10-CM | POA: Diagnosis not present

## 2017-12-01 DIAGNOSIS — Z170 Estrogen receptor positive status [ER+]: Principal | ICD-10-CM

## 2017-12-02 ENCOUNTER — Ambulatory Visit
Admission: RE | Admit: 2017-12-02 | Discharge: 2017-12-02 | Disposition: A | Discharge status: Home / Self Care | Payer: Medicare Other | Source: Ambulatory Visit | Attending: Radiation Oncology | Admitting: Radiation Oncology

## 2017-12-02 DIAGNOSIS — Z510 Encounter for antineoplastic radiation therapy: Secondary | ICD-10-CM | POA: Diagnosis not present

## 2017-12-02 DIAGNOSIS — Z170 Estrogen receptor positive status [ER+]: Principal | ICD-10-CM

## 2017-12-02 DIAGNOSIS — C50811 Malignant neoplasm of overlapping sites of right female breast: Secondary | ICD-10-CM | POA: Diagnosis not present

## 2017-12-02 DIAGNOSIS — C50212 Malignant neoplasm of upper-inner quadrant of left female breast: Secondary | ICD-10-CM

## 2017-12-02 DIAGNOSIS — C50812 Malignant neoplasm of overlapping sites of left female breast: Secondary | ICD-10-CM | POA: Diagnosis not present

## 2017-12-02 MED ORDER — SILVER SULFADIAZINE 1 % EX CREA
TOPICAL_CREAM | Freq: Two times a day (BID) | CUTANEOUS | Status: DC
  Administered 2017-12-02: 11:00:00 via TOPICAL

## 2017-12-02 MED ORDER — RADIAPLEXRX EX GEL
Freq: Once | CUTANEOUS | Status: AC
  Administered 2017-12-02: 11:00:00 via TOPICAL

## 2017-12-03 ENCOUNTER — Ambulatory Visit
Admission: RE | Admit: 2017-12-03 | Discharge: 2017-12-03 | Disposition: A | Discharge status: Home / Self Care | Payer: Medicare Other | Source: Ambulatory Visit | Attending: Radiation Oncology | Admitting: Radiation Oncology

## 2017-12-03 DIAGNOSIS — Z510 Encounter for antineoplastic radiation therapy: Secondary | ICD-10-CM | POA: Diagnosis not present

## 2017-12-03 DIAGNOSIS — C50812 Malignant neoplasm of overlapping sites of left female breast: Secondary | ICD-10-CM | POA: Diagnosis not present

## 2017-12-03 DIAGNOSIS — C50811 Malignant neoplasm of overlapping sites of right female breast: Secondary | ICD-10-CM | POA: Diagnosis not present

## 2017-12-03 DIAGNOSIS — C50212 Malignant neoplasm of upper-inner quadrant of left female breast: Secondary | ICD-10-CM | POA: Diagnosis not present

## 2017-12-08 ENCOUNTER — Telehealth: Payer: Self-pay | Admitting: Genetics

## 2017-12-08 NOTE — Telephone Encounter (Signed)
Revealed negative genetic testing.     This normal result is reassuring and indicates that it is unlikely Charlene Rubio's cancer is due to a hereditary cause.  It is unlikely that there is an increased risk of another cancer due to a mutation in one of these genes.  However, genetic testing is not perfect, and cannot definitively rule out a hereditary cause.  It will be important for her to keep in contact with genetics to learn if any additional testing may be needed in the future.

## 2017-12-09 ENCOUNTER — Other Ambulatory Visit: Payer: Self-pay

## 2017-12-09 ENCOUNTER — Ambulatory Visit
Admission: RE | Admit: 2017-12-09 | Discharge: 2017-12-09 | Disposition: A | Discharge status: Home / Self Care | Payer: Medicare Other | Source: Ambulatory Visit | Attending: Radiation Oncology | Admitting: Radiation Oncology

## 2017-12-09 ENCOUNTER — Encounter: Payer: Self-pay | Admitting: Radiation Oncology

## 2017-12-09 VITALS — BP 160/57 | HR 63 | Temp 97.8°F | Resp 20 | Wt 166.0 lb

## 2017-12-09 DIAGNOSIS — Z7984 Long term (current) use of oral hypoglycemic drugs: Secondary | ICD-10-CM | POA: Diagnosis not present

## 2017-12-09 DIAGNOSIS — C50212 Malignant neoplasm of upper-inner quadrant of left female breast: Secondary | ICD-10-CM | POA: Insufficient documentation

## 2017-12-09 DIAGNOSIS — Z79811 Long term (current) use of aromatase inhibitors: Secondary | ICD-10-CM | POA: Diagnosis not present

## 2017-12-09 DIAGNOSIS — Z923 Personal history of irradiation: Secondary | ICD-10-CM | POA: Diagnosis not present

## 2017-12-09 DIAGNOSIS — C50812 Malignant neoplasm of overlapping sites of left female breast: Secondary | ICD-10-CM | POA: Insufficient documentation

## 2017-12-09 DIAGNOSIS — Z7901 Long term (current) use of anticoagulants: Secondary | ICD-10-CM | POA: Insufficient documentation

## 2017-12-09 DIAGNOSIS — Z79899 Other long term (current) drug therapy: Secondary | ICD-10-CM | POA: Insufficient documentation

## 2017-12-09 DIAGNOSIS — Z170 Estrogen receptor positive status [ER+]: Secondary | ICD-10-CM | POA: Diagnosis not present

## 2017-12-09 DIAGNOSIS — Z7982 Long term (current) use of aspirin: Secondary | ICD-10-CM | POA: Insufficient documentation

## 2017-12-09 DIAGNOSIS — C50811 Malignant neoplasm of overlapping sites of right female breast: Secondary | ICD-10-CM

## 2017-12-09 NOTE — Progress Notes (Signed)
Radiation Oncology         [phone] ________________________________  Name: Charlene Rubio MRN: [id number]  Date: 12/09/2017  DOB: Dec 25, 1945  Follow-Up Visit Note  Outpatient  CC: [name], DO  Nicholas Lose, MD  Diagnosis and Prior Radiotherapy:    ICD-10-CM   1. Malignant neoplasm of upper-inner quadrant of left breast in female, estrogen receptor positive ([place]) C50.212    Z17.0   2. 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  She denies pain. She does report itching to her Left Chest. Her radiation site has improved. There is no drainage present at this time. She continues to use silvadene to the Left Axilla.   She is applying radiaplex to the other areas of her radiation site. She continues Herceptin and Letrozole per medical oncology.                               ALLERGIES:  has No Known Allergies.  Meds: Current Outpatient Medications  Medication Sig Dispense Refill  . apixaban (ELIQUIS) 5 MG TABS tablet Take 1 tablet (5 mg total) by mouth 2 (two) times daily. 60 tablet 5  . aspirin EC 81 MG EC tablet Take 1 tablet (81 mg total) by mouth daily.    . carvedilol (COREG) 6.25 MG tablet Take 1 tablet (6.25 mg total) by mouth 2 (two) times daily with a meal. 180 tablet 3  . donepezil (ARICEPT) 10 MG tablet Take 1 tablet (10 mg total) by mouth at bedtime. 30 tablet 6  . furosemide (LASIX) 40 MG tablet Take 1.5 tablets (60 mg total) by mouth daily. 145  tablet 3  . hyaluronate sodium (RADIAPLEXRX) GEL Apply 1 application topically 2 (two) times daily.    . isosorbide mononitrate (IMDUR) 30 MG 24 hr tablet Take 0.5 tablets (15 mg total) by mouth daily. 45 tablet 3  . letrozole (FEMARA) 2.5 MG tablet Take 1 tablet (2.5 mg total) by mouth daily. 90 tablet 3  . lidocaine-prilocaine (EMLA) cream Apply to affected area once 30 g 3  . lisinopril (PRINIVIL,ZESTRIL) 20 MG tablet Take 1.5 tablets (30 mg total) by mouth daily. 135 tablet 3  . metFORMIN (GLUCOPHAGE-XR) 500 MG 24 hr tablet Take 1 tablet (500 mg total) by mouth 2 (two) times daily with a meal. 180 tablet 1  . Multiple Vitamins-Minerals (ADULT GUMMY PO) Take 1 tablet by mouth 3 (three) times a week.    . non-metallic deodorant Jethro Poling) MISC Apply 1 application topically daily as needed.     No current facility-administered medications for this encounter.     Physical Findings: The patient is in no acute distress. Patient is alert a weight is 166 lb (75.3 kg). Her temperature is 97.8 F (36.6 C). Her blood pressure is 160/57 (abnormal) and her pulse is 63. Her respiration is 20 and oxygen saturation is 99%. Marland Kitchen  Satisfactory skin healing in radiotherapy fields thus far. Minimal moist peeling, skin has regenerated in left axilla.  Small patches of open peeling skin on left chest wall. Resolving erythema and dry scabbing/desquamation.   Lab Findings: Lab Results  Component Value Date   WBC 4.6 11/25/2017   HGB 12.1 11/25/2017   HCT 37.0 11/25/2017   MCV 96.4 11/25/2017   PLT 151 11/25/2017    Radiographic Findings: No results found.  Impression/Plan: Healing well from radiotherapy    Continue skin care with topical radiaplex (to closed skin) and silvadene (to open areas)  They would prefer to see me prn unless healing does not continue.  This is fine.  I told them a lot of healing will continue particularly over the next month.  In 3 weeks, if skin is intact, may transition to  vitamin E lotion , for a couple more months.  _____________________________________   Eppie Gibson, MD  This document serves as a record of services personally performed by Eppie Gibson, MD. It was created on his behalf by Linward Natal, a trained medical scribe. The creation of this record is based on the scribe's personal observations and the provider's statements to them. This document has been checked and approved by the attending provider.

## 2017-12-09 NOTE — Progress Notes (Signed)
Ms. Ginzburg presents for follow up of radiation completed 12/03/17 to her Left Chest. She denies pain. She does report itching to her Left Chest. Her radiation site has improved. There is no drainage present at this time. She continues to use silvadene to the Left Axilla. She does have several open areas to her mid chest. She is applying radiaplex to the other areas of her radiation site. She continues Herceptin and Letrozole per medical oncology.   BP (!) 160/57   Pulse 63   Temp 97.8 F (36.6 C)   Resp 20   Wt 166 lb (75.3 kg)   LMP  (LMP Unknown)   SpO2 99% Comment: room air  BMI 26.79 kg/m    Wt Readings from Last 3 Encounters:  12/09/17 166 lb (75.3 kg)  11/25/17 161 lb (73 kg)  11/14/17 161 lb (73 kg)

## 2017-12-10 ENCOUNTER — Telehealth: Payer: Self-pay

## 2017-12-10 NOTE — Telephone Encounter (Signed)
I called and left a voice mail with Charlene Rubio at the request of Dr. Isidore Moos. I let him know that in 3 weeks if Charlene Rubio's skin is intact and healed that they should begin applying vitamin E lotion, twice daily for a couple more months. I left my direct contact number if he had any further questions or concerns.

## 2017-12-12 ENCOUNTER — Encounter: Payer: Self-pay | Admitting: Genetics

## 2017-12-12 ENCOUNTER — Ambulatory Visit: Payer: Self-pay | Admitting: Genetics

## 2017-12-12 DIAGNOSIS — C50811 Malignant neoplasm of overlapping sites of right female breast: Secondary | ICD-10-CM

## 2017-12-12 DIAGNOSIS — Z170 Estrogen receptor positive status [ER+]: Principal | ICD-10-CM

## 2017-12-12 DIAGNOSIS — C50212 Malignant neoplasm of upper-inner quadrant of left female breast: Secondary | ICD-10-CM

## 2017-12-12 DIAGNOSIS — C50812 Malignant neoplasm of overlapping sites of left female breast: Secondary | ICD-10-CM

## 2017-12-12 DIAGNOSIS — Z1379 Encounter for other screening for genetic and chromosomal anomalies: Secondary | ICD-10-CM

## 2017-12-12 NOTE — Progress Notes (Signed)
HPI:  Charlene Rubio was previously seen in the [facility] clinic on 10/27/2017 due to a personal history of bilateral breast cancer and concerns regarding a hereditary predisposition to cancer. Please refer to our prior cancer genetics clinic note for more information regarding Charlene Rubio's medical, social and family histories, and our assessment and recommendations, at the time. Charlene Rubio recent genetic test results were disclosed to her, as well as recommendations warranted by these results. These results and recommendations are discussed in more detail below.  CANCER HISTORY:    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, estrogen receptor positive ([place])   06/23/2017 Initial Diagnosis    Left breast 11 o'clock position 6.3 x 4.2 x 6.1 cm mass with thick papillary projections, 2 adjacent left axillary lymph nodes; right breast 930 position 1.7 cm mass; left breast and left lymph node biopsy revealed IDC grade 3, ER 5%, PR 0%, Ki-67 50%, HER-2 positive ratio 2.04 T3N1 stage IIIa; right breast: Grade 1 IDC  ER 95%, PR 95%, Ki-67 15%, HER-2 negative; T1CN0 stage I a      08/27/2017 Surgery    Right simple mastectomy: IDC grade 1, 1.1 cm, DCIS low-grade, margins negative, 0/2 lymph nodes negative; ER 95%, PR 95%, HER-2 negative Ki-67 15% T1 cN0 stage I a left simple mastectomy: IDC grade 3, 6.2 cm, 4/13 lymph nodes positive, margins negative, ER 5 %, PR 0%, HER-2 positive ratio 2.04, Ki-67 50 % T3 N2 stage IIIa       10/14/2017 -  Chemotherapy    Adjuvant Herceptin alone for 1 year       10/23/2017 - 11/25/2017 Radiation Therapy    Adjuvant radiation therapy      12/05/2017 Genetic Testing    The Common Hereditary Cancer Panel offered by [facility] includes sequencing and/or deletion duplication testing of the following 47 genes: APC, ATM, AXIN2, BARD1, BMPR1A, BRCA1, BRCA2, BRIP1, CDH1, CDKN2A (p14ARF), CDKN2A (p16INK4a), CKD4, CHEK2, CTNNA1, DICER1, EPCAM  (Deletion/duplication testing only), GREM1 (promoter region deletion/duplication testing only), KIT, MEN1, MLH1, MSH2, MSH3, MSH6, MUTYH, NBN, NF1, NHTL1, PALB2, PDGFRA, PMS2, POLD1, POLE, PTEN, RAD50, RAD51C, RAD51D, SDHB, SDHC, SDHD, SMAD4, SMARCA4. STK11, TP53, TSC1, TSC2, and VHL.  The following genes were evaluated for sequence changes only: SDHA and HOXB13 c.251G>A variant only.  Results: Negative, no pathogenic variants identified.  The date of this test report is 12/05/2017.         FAMILY HISTORY:  We obtained a detailed, 4-generation family history.  Significant diagnoses are listed below: Family History  Problem Relation Age of Onset  . Alzheimer's disease Mother 37  . Alzheimer's disease Father 33  . Congestive Heart Failure Father   . Alcoholism Brother   . Alzheimer's disease Brother   . Dementia Brother   . Alcoholism Brother   . Diabetes Brother     Charlene Rubio has 3 sons with no history of cancer.  She has some grandchildren- the oldest is 56.  Charlene Rubio ahs 3 brothers- 2 in their 63's with no history of cancer and one who died at 75 with no history of cancer.    Charlene Rubio's father died at 28 with no history of cancer.  Charlene Rubio has 4 paternal aunts and 4 paternal uncles with no history of cancer, although she has limited information about these relatives.  She is not aware of any cancer in her paternal relatives, although has limited information about these relatives.  Charlene Rubio paternal grandfather  died at 62 with no history of cancer, and her paternal grandmother had no history of cancer.    Charlene Rubio's mother died at 18 with no history of cancer.  Charlene Rubio has 3 maternal aunts and 4 maternal uncles with no history of cancer.  No maternal cousins with any history of cancer.  Her maternal grandparents died in their 62's and 9's with no history of cancer.    Charlene Rubio is unaware of previous family history of genetic testing for hereditary cancer risks. Patient's  maternal ancestors are of Northern European descent, and paternal ancestors are of English descent. There is no reported Ashkenazi Jewish ancestry. There is no known consanguinity.  GENETIC TEST RESULTS: Genetic testing performed through [facility]'s Common Hereditary Cancers Panel reported out on 12/05/2017 showed no pathogenic mutations. The Common Hereditary Cancer Panel offered by [facility] includes sequencing and/or deletion duplication testing of the following 47 genes: APC, ATM, AXIN2, BARD1, BMPR1A, BRCA1, BRCA2, BRIP1, CDH1, CDKN2A (p14ARF), CDKN2A (p16INK4a), CKD4, CHEK2, CTNNA1, DICER1, EPCAM (Deletion/duplication testing only), GREM1 (promoter region deletion/duplication testing only), KIT, MEN1, MLH1, MSH2, MSH3, MSH6, MUTYH, NBN, NF1, NHTL1, PALB2, PDGFRA, PMS2, POLD1, POLE, PTEN, RAD50, RAD51C, RAD51D, SDHB, SDHC, SDHD, SMAD4, SMARCA4. STK11, TP53, TSC1, TSC2, and VHL.  The following genes were evaluated for sequence changes only: SDHA and HOXB13 c.251G>A variant only..  The test report will be scanned into EPIC and will be located under the Molecular Pathology section of the Results Review tab. A portion of the result report is included below for reference.     We discussed with Charlene Rubio that because current genetic testing is not perfect, it is possible there may be a gene mutation in one of these genes that current testing cannot detect, but that chance is small.  We also discussed, that there could be another gene that has not yet been discovered, or that we have not yet tested, that is responsible for the cancer diagnoses in the family. It is also possible there is a hereditary cause for the cancer in the family that Charlene Rubio did not inherit and therefore was not identified in her testing.  Therefore, it is important to remain in touch with cancer genetics in the future so that we can continue to offer Charlene Rubio the most up to date genetic testing.   ADDITIONAL GENETIC TESTING: We discussed  with Charlene Rubio that there are other genes that are associated with increased cancer risk that can be analyzed. The laboratories that offer this testing look at these additional genes via a hereditary cancer gene panel. Should Charlene Rubio wish to pursue additional genetic testing, we are happy to discuss and coordinate this testing, at any time.    CANCER SCREENING RECOMMENDATIONS: This result indicates that it is unlikely Charlene Rubio has an increased risk for a future cancer due to a mutation in one of these genes. This normal test also suggests that Charlene Rubio's cancer was most likely not due to an inherited predisposition associated with one of these genes.  Most cancers happen by chance and this negative test suggests that her cancer may fall into this category.  Therefore, it is recommended she continue to follow the cancer management and screening guidelines provided by her oncology and primary healthcare provider. Other factors such as her personal and family history may still affect her cancer risk.  RECOMMENDATIONS FOR FAMILY MEMBERS:  Relatives in this family might be at some increased risk of developing cancer, over the general population risk,  simply due to the family history of cancer.  We recommended women in this family have a yearly mammogram beginning at age 2, or 23 years younger than the earliest onset of cancer, an annual clinical breast exam, and perform monthly breast self-exams. Women in this family should also have a gynecological exam as recommended by their primary provider. All family members should have a colonoscopy by age 28 (or as directed by their doctors).  All family members should inform their physicians about the family history of cancer so their doctors can make the most appropriate screening recommendations for them.   FOLLOW-UP: Lastly, we discussed with Charlene Rubio that cancer genetics is a rapidly advancing field and it is possible that new genetic tests will be appropriate  for her and/or her family members in the future. We encouraged her to remain in contact with cancer genetics on an annual basis so we can update her personal and family histories and let her know of advances in cancer genetics that may benefit this family.   Our contact number was provided. Ms. Oftedahl's questions were answered to her satisfaction, and she knows she is welcome to call us at anytime with additional questions or concerns.   Ferol Luz, MS, [facility] Certified Genetic Counselor [email or web address]

## 2017-12-16 ENCOUNTER — Inpatient Hospital Stay: Payer: Medicare Other

## 2017-12-16 ENCOUNTER — Encounter: Payer: Self-pay | Admitting: Radiation Oncology

## 2017-12-16 VITALS — BP 152/54 | HR 60 | Temp 98.4°F | Resp 18

## 2017-12-16 DIAGNOSIS — C50812 Malignant neoplasm of overlapping sites of left female breast: Principal | ICD-10-CM

## 2017-12-16 DIAGNOSIS — Z170 Estrogen receptor positive status [ER+]: Principal | ICD-10-CM

## 2017-12-16 DIAGNOSIS — C50811 Malignant neoplasm of overlapping sites of right female breast: Secondary | ICD-10-CM | POA: Diagnosis not present

## 2017-12-16 DIAGNOSIS — Z5112 Encounter for antineoplastic immunotherapy: Secondary | ICD-10-CM | POA: Diagnosis not present

## 2017-12-16 MED ORDER — DIPHENHYDRAMINE HCL 25 MG PO CAPS
50.0000 mg | ORAL_CAPSULE | Freq: Once | ORAL | Status: AC
  Administered 2017-12-16: 50 mg via ORAL

## 2017-12-16 MED ORDER — ACETAMINOPHEN 325 MG PO TABS
ORAL_TABLET | ORAL | Status: AC
Start: 2017-12-16 — End: ?
  Filled 2017-12-16: qty 2

## 2017-12-16 MED ORDER — SODIUM CHLORIDE 0.9% FLUSH
10.0000 mL | INTRAVENOUS | Status: DC | PRN
  Administered 2017-12-16: 10 mL
  Filled 2017-12-16: qty 10

## 2017-12-16 MED ORDER — ACETAMINOPHEN 325 MG PO TABS
650.0000 mg | ORAL_TABLET | Freq: Once | ORAL | Status: AC
  Administered 2017-12-16: 650 mg via ORAL

## 2017-12-16 MED ORDER — DIPHENHYDRAMINE HCL 25 MG PO CAPS
ORAL_CAPSULE | ORAL | Status: AC
  Filled 2017-12-16: qty 1

## 2017-12-16 MED ORDER — HEPARIN SOD (PORK) LOCK FLUSH 100 UNIT/ML IV SOLN
500.0000 [IU] | Freq: Once | INTRAVENOUS | Status: AC | PRN
  Administered 2017-12-16: 500 [IU]
  Filled 2017-12-16: qty 5

## 2017-12-16 MED ORDER — SODIUM CHLORIDE 0.9 % IV SOLN
Freq: Once | INTRAVENOUS | Status: AC
  Administered 2017-12-16: 10:00:00 via INTRAVENOUS

## 2017-12-16 MED ORDER — TRASTUZUMAB CHEMO 150 MG IV SOLR
450.0000 mg | Freq: Once | INTRAVENOUS | Status: AC
  Administered 2017-12-16: 450 mg via INTRAVENOUS
  Filled 2017-12-16: qty 21.43

## 2017-12-16 NOTE — Patient Instructions (Signed)
[facility] Discharge Instructions for Patients Receiving Chemotherapy  Today you received the following chemotherapy agents: Trastuzumab (Herceptin).  To help prevent nausea and vomiting after your treatment, we encourage you to take your nausea medication as prescribed. If you develop nausea and vomiting that is not controlled by your nausea medication, call the clinic.   BELOW ARE SYMPTOMS THAT SHOULD BE REPORTED IMMEDIATELY:  *FEVER GREATER THAN 100.5 F  *CHILLS WITH OR WITHOUT FEVER  NAUSEA AND VOMITING THAT IS NOT CONTROLLED WITH YOUR NAUSEA MEDICATION  *UNUSUAL SHORTNESS OF BREATH  *UNUSUAL BRUISING OR BLEEDING  TENDERNESS IN MOUTH AND THROAT WITH OR WITHOUT PRESENCE OF ULCERS  *URINARY PROBLEMS  *BOWEL PROBLEMS  UNUSUAL RASH Items with * indicate a potential emergency and should be followed up as soon as possible.  Feel free to call the clinic should you have any questions or concerns. The clinic phone number is [phone].  Please show the CHEMO ALERT CARD at check-in to the Emergency Department and triage nurse.   

## 2017-12-16 NOTE — Progress Notes (Signed)
  Radiation Oncology         [phone] ________________________________  Name: Charlene Rubio MRN: [id number]  Date: 12/16/2017  DOB: 26-May-1946  End of Treatment Note  Diagnosis: Cancer Staging 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, estrogen receptor positive ([place]) Staging form: Breast, AJCC 8th Edition - Pathologic: Stage IA (pT1c, pN0, cM0, G1, ER: Positive, PR: Positive, HER2: Negative) - Unsigned - Pathologic: Stage IIIA (pT3, pN2a, cM0, G3, ER: Positive, PR: Negative, HER2: Positive) - Unsigned  LEFT BREAST CANCER  Indication for treatment:  Curative       Radiation treatment dates:   10/23/17 - 12/03/17  Site/dose:  1) Left chest wall and IM, SCV, Axillary nodes / 50 Gy in 25 fx  2) boost of 10 Gy in 5 fx  Beams/energy:   1) 3D / 6X,10X  2) en face / 6 MEV  Narrative: The patient tolerated radiation treatment relatively well.   She endorsed redness and hyperpigmentation at left chest throughout treatment and treated this with radiaplex.  Plan: The patient has completed radiation treatment. The patient will return to radiation oncology clinic for routine followup in one month. I advised them to call or return sooner if they have any questions or concerns related to their recovery or treatment.  -----------------------------------  Eppie Gibson, MD   Eppie Gibson

## 2017-12-24 ENCOUNTER — Other Ambulatory Visit: Payer: Self-pay | Admitting: Cardiovascular Disease

## 2018-01-05 ENCOUNTER — Ambulatory Visit: Payer: Self-pay | Admitting: Radiation Oncology

## 2018-01-05 ENCOUNTER — Other Ambulatory Visit: Payer: Self-pay

## 2018-01-05 DIAGNOSIS — Z170 Estrogen receptor positive status [ER+]: Principal | ICD-10-CM

## 2018-01-05 DIAGNOSIS — C50212 Malignant neoplasm of upper-inner quadrant of left female breast: Secondary | ICD-10-CM

## 2018-01-06 ENCOUNTER — Other Ambulatory Visit: Payer: Medicare Other

## 2018-01-06 ENCOUNTER — Inpatient Hospital Stay (HOSPITAL_BASED_OUTPATIENT_CLINIC_OR_DEPARTMENT_OTHER): Payer: Medicare Other | Admitting: Hematology and Oncology

## 2018-01-06 ENCOUNTER — Inpatient Hospital Stay: Payer: Medicare Other

## 2018-01-06 ENCOUNTER — Ambulatory Visit: Payer: Medicare Other | Admitting: Hematology and Oncology

## 2018-01-06 ENCOUNTER — Ambulatory Visit: Payer: Medicare Other

## 2018-01-06 ENCOUNTER — Inpatient Hospital Stay: Payer: Medicare Other | Attending: Hematology and Oncology

## 2018-01-06 DIAGNOSIS — C50212 Malignant neoplasm of upper-inner quadrant of left female breast: Secondary | ICD-10-CM

## 2018-01-06 DIAGNOSIS — C50812 Malignant neoplasm of overlapping sites of left female breast: Secondary | ICD-10-CM | POA: Diagnosis not present

## 2018-01-06 DIAGNOSIS — Z170 Estrogen receptor positive status [ER+]: Secondary | ICD-10-CM | POA: Diagnosis not present

## 2018-01-06 DIAGNOSIS — Z5112 Encounter for antineoplastic immunotherapy: Secondary | ICD-10-CM | POA: Diagnosis not present

## 2018-01-06 DIAGNOSIS — C773 Secondary and unspecified malignant neoplasm of axilla and upper limb lymph nodes: Secondary | ICD-10-CM

## 2018-01-06 DIAGNOSIS — C50811 Malignant neoplasm of overlapping sites of right female breast: Secondary | ICD-10-CM | POA: Diagnosis not present

## 2018-01-06 DIAGNOSIS — Z95828 Presence of other vascular implants and grafts: Secondary | ICD-10-CM

## 2018-01-06 LAB — CBC WITH DIFFERENTIAL (CANCER CENTER ONLY)
BASOS ABS: 0 10*3/uL (ref 0.0–0.1)
BASOS PCT: 1 %
EOS ABS: 0.1 10*3/uL (ref 0.0–0.5)
Eosinophils Relative: 3 %
HCT: 37.4 % (ref 34.8–46.6)
HEMOGLOBIN: 12.4 g/dL (ref 11.6–15.9)
LYMPHS ABS: 0.3 10*3/uL — AB (ref 0.9–3.3)
Lymphocytes Relative: 7 %
MCH: 31.9 pg (ref 25.1–34.0)
MCHC: 33.2 g/dL (ref 31.5–36.0)
MCV: 96.1 fL (ref 79.5–101.0)
Monocytes Absolute: 0.3 10*3/uL (ref 0.1–0.9)
Monocytes Relative: 7 %
NEUTROS PCT: 82 %
Neutro Abs: 3.6 10*3/uL (ref 1.5–6.5)
Platelet Count: 149 10*3/uL (ref 145–400)
RBC: 3.89 MIL/uL (ref 3.70–5.45)
RDW: 14.2 % (ref 11.2–14.5)
WBC: 4.4 10*3/uL (ref 3.9–10.3)

## 2018-01-06 LAB — CMP (CANCER CENTER ONLY)
ALK PHOS: 67 U/L (ref 40–150)
ALT: 10 U/L (ref 0–55)
AST: 15 U/L (ref 5–34)
Albumin: 3.8 g/dL (ref 3.5–5.0)
Anion gap: 9 (ref 3–11)
BILIRUBIN TOTAL: 0.6 mg/dL (ref 0.2–1.2)
BUN: 15 mg/dL (ref 7–26)
CALCIUM: 9.2 mg/dL (ref 8.4–10.4)
CO2: 27 mmol/L (ref 22–29)
CREATININE: 0.74 mg/dL (ref 0.60–1.10)
Chloride: 108 mmol/L (ref 98–109)
Glucose, Bld: 165 mg/dL — ABNORMAL HIGH (ref 70–140)
Potassium: 3.4 mmol/L — ABNORMAL LOW (ref 3.5–5.1)
Sodium: 144 mmol/L (ref 136–145)
Total Protein: 6.6 g/dL (ref 6.4–8.3)

## 2018-01-06 MED ORDER — ACETAMINOPHEN 325 MG PO TABS
650.0000 mg | ORAL_TABLET | Freq: Once | ORAL | Status: AC
  Administered 2018-01-06: 650 mg via ORAL

## 2018-01-06 MED ORDER — SODIUM CHLORIDE 0.9% FLUSH
10.0000 mL | INTRAVENOUS | Status: DC | PRN
  Administered 2018-01-06: 10 mL
  Filled 2018-01-06: qty 10

## 2018-01-06 MED ORDER — TRASTUZUMAB CHEMO 150 MG IV SOLR
450.0000 mg | Freq: Once | INTRAVENOUS | Status: AC
  Administered 2018-01-06: 450 mg via INTRAVENOUS
  Filled 2018-01-06: qty 21.43

## 2018-01-06 MED ORDER — SODIUM CHLORIDE 0.9 % IV SOLN
Freq: Once | INTRAVENOUS | Status: AC
  Administered 2018-01-06: 11:00:00 via INTRAVENOUS

## 2018-01-06 MED ORDER — HEPARIN SOD (PORK) LOCK FLUSH 100 UNIT/ML IV SOLN
500.0000 [IU] | Freq: Once | INTRAVENOUS | Status: AC | PRN
Start: 2018-01-06 — End: 2018-01-06
  Administered 2018-01-06: 500 [IU]
  Filled 2018-01-06: qty 5

## 2018-01-06 MED ORDER — ACETAMINOPHEN 325 MG PO TABS
ORAL_TABLET | ORAL | Status: AC
  Filled 2018-01-06: qty 2

## 2018-01-06 MED ORDER — DIPHENHYDRAMINE HCL 25 MG PO CAPS
ORAL_CAPSULE | ORAL | Status: AC
  Filled 2018-01-06: qty 2

## 2018-01-06 MED ORDER — DIPHENHYDRAMINE HCL 25 MG PO CAPS
50.0000 mg | ORAL_CAPSULE | Freq: Once | ORAL | Status: AC
  Administered 2018-01-06: 50 mg via ORAL

## 2018-01-06 NOTE — Patient Instructions (Signed)
[facility] Discharge Instructions for Patients Receiving Chemotherapy  Today you received the following chemotherapy agents: trastuzumab (Herceptin).  To help prevent nausea and vomiting after your treatment, we encourage you to take your nausea medication as prescribed.  If you develop nausea and vomiting that is not controlled by your nausea medication, call the clinic.   BELOW ARE SYMPTOMS THAT SHOULD BE REPORTED IMMEDIATELY:  *FEVER GREATER THAN 100.5 F  *CHILLS WITH OR WITHOUT FEVER  NAUSEA AND VOMITING THAT IS NOT CONTROLLED WITH YOUR NAUSEA MEDICATION  *UNUSUAL SHORTNESS OF BREATH  *UNUSUAL BRUISING OR BLEEDING  TENDERNESS IN MOUTH AND THROAT WITH OR WITHOUT PRESENCE OF ULCERS  *URINARY PROBLEMS  *BOWEL PROBLEMS  UNUSUAL RASH Items with * indicate a potential emergency and should be followed up as soon as possible.  Feel free to call the clinic should you have any questions or concerns. The clinic phone number is [phone].  Please show the [place] at check-in to the Emergency Department and triage nurse.

## 2018-01-06 NOTE — Progress Notes (Signed)
Patient Care Team: [name], DO as PCP - General (Internal Medicine)  DIAGNOSIS:  Encounter Diagnosis  Name Primary?  . 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, estrogen receptor positive ([place])     SUMMARY OF ONCOLOGIC HISTORY:   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, estrogen receptor positive ([place])   06/23/2017 Initial Diagnosis    Left breast 11 o'clock position 6.3 x 4.2 x 6.1 cm mass with thick papillary projections, 2 adjacent left axillary lymph nodes; right breast 930 position 1.7 cm mass; left breast and left lymph node biopsy revealed IDC grade 3, ER 5%, PR 0%, Ki-67 50%, HER-2 positive ratio 2.04 T3N1 stage IIIa; right breast: Grade 1 IDC  ER 95%, PR 95%, Ki-67 15%, HER-2 negative; T1CN0 stage I a      08/27/2017 Surgery    Right simple mastectomy: IDC grade 1, 1.1 cm, DCIS low-grade, margins negative, 0/2 lymph nodes negative; ER 95%, PR 95%, HER-2 negative Ki-67 15% T1 cN0 stage I a left simple mastectomy: IDC grade 3, 6.2 cm, 4/13 lymph nodes positive, margins negative, ER 5 %, PR 0%, HER-2 positive ratio 2.04, Ki-67 50 % T3 N2 stage IIIa       10/14/2017 -  Chemotherapy    Adjuvant Herceptin alone for 1 year       10/23/2017 - 11/25/2017 Radiation Therapy    Adjuvant radiation therapy      12/05/2017 Genetic Testing    The Common Hereditary Cancer Panel offered by [facility] includes sequencing and/or deletion duplication testing of the following 47 genes: APC, ATM, AXIN2, BARD1, BMPR1A, BRCA1, BRCA2, BRIP1, CDH1, CDKN2A (p14ARF), CDKN2A (p16INK4a), CKD4, CHEK2, CTNNA1, DICER1, EPCAM (Deletion/duplication testing only), GREM1 (promoter region deletion/duplication testing only), KIT, MEN1, MLH1, MSH2, MSH3, MSH6, MUTYH, NBN, NF1, NHTL1, PALB2, PDGFRA, PMS2, POLD1, POLE, PTEN, RAD50, RAD51C, RAD51D, SDHB, SDHC, SDHD, SMAD4, SMARCA4. STK11, TP53, TSC1, TSC2, and VHL.  The following genes were evaluated for sequence  changes only: SDHA and HOXB13 c.251G>A variant only.  Results: Negative, no pathogenic variants identified.  The date of this test report is 12/05/2017.        CHIEF COMPLIANT: Herceptin and anastrozole therapy  INTERVAL HISTORY: Charlene Rubio is a 72 year old with above-mentioned history of right breast cancer treated with mastectomy and is currently on adjuvant Herceptin.  She is tolerating extremely well.  She is also on adjuvant antiestrogen therapy with Letrozole.  She denies any hot flashes or myalgias.  REVIEW OF SYSTEMS:   Constitutional: Denies fevers, chills or abnormal weight loss Eyes: Denies blurriness of vision Ears, nose, mouth, throat, and face: Denies mucositis or sore throat Respiratory: Denies cough, dyspnea or wheezes Cardiovascular: Denies palpitation, chest discomfort Gastrointestinal:  Denies nausea, heartburn or change in bowel habits Skin: Denies abnormal skin rashes Lymphatics: Denies new lymphadenopathy or easy bruising Neurological:Denies numbness, tingling or new weaknesses Behavioral/Psych: Mood is stable, no new changes  Extremities: No lower extremity edema Breast:  denies any pain or lumps or nodules in either breasts All other systems were reviewed with the patient and are negative.  I have reviewed the past medical history, past surgical history, social history and family history with the patient and they are unchanged from previous note.  ALLERGIES:  has No Known Allergies.  MEDICATIONS:  Current Outpatient Medications  Medication Sig Dispense Refill  . aspirin EC 81 MG EC tablet Take 1 tablet (81 mg total) by mouth daily.    . carvedilol (COREG) 6.25  MG tablet Take 1 tablet (6.25 mg total) by mouth 2 (two) times daily with a meal. 180 tablet 3  . donepezil (ARICEPT) 10 MG tablet Take 1 tablet (10 mg total) by mouth at bedtime. 30 tablet 6  . ELIQUIS 5 MG TABS tablet TAKE 1 TABLET BY MOUTH TWICE A DAY 60 tablet 4  . furosemide (LASIX) 40 MG  tablet Take 1.5 tablets (60 mg total) by mouth daily. 145 tablet 3  . hyaluronate sodium (RADIAPLEXRX) GEL Apply 1 application topically 2 (two) times daily.    . isosorbide mononitrate (IMDUR) 30 MG 24 hr tablet Take 0.5 tablets (15 mg total) by mouth daily. 45 tablet 3  . letrozole (FEMARA) 2.5 MG tablet Take 1 tablet (2.5 mg total) by mouth daily. 90 tablet 3  . lidocaine-prilocaine (EMLA) cream Apply to affected area once 30 g 3  . lisinopril (PRINIVIL,ZESTRIL) 20 MG tablet Take 1.5 tablets (30 mg total) by mouth daily. 135 tablet 3  . metFORMIN (GLUCOPHAGE-XR) 500 MG 24 hr tablet Take 1 tablet (500 mg total) by mouth 2 (two) times daily with a meal. 180 tablet 1  . Multiple Vitamins-Minerals (ADULT GUMMY PO) Take 1 tablet by mouth 3 (three) times a week.    . non-metallic deodorant Jethro Poling) MISC Apply 1 application topically daily as needed.     No current facility-administered medications for this visit.     PHYSICAL EXAMINATION: ECOG PERFORMANCE STATUS: 1 - Symptomatic but completely ambulatory  Vitals:   01/06/18 1052  BP: (!) 172/72  Pulse: 66  Resp: 16  Temp: 99 F (37.2 C)  SpO2: 100%   Filed Weights   01/06/18 1052  Weight: 163 lb 11.2 oz (74.3 kg)    GENERAL:alert, no distress and comfortable SKIN: skin color, texture, turgor are normal, no rashes or significant lesions EYES: normal, Conjunctiva are pink and non-injected, sclera clear OROPHARYNX:no exudate, no erythema and lips, buccal mucosa, and tongue normal  NECK: supple, thyroid normal size, non-tender, without nodularity LYMPH:  no palpable lymphadenopathy in the cervical, axillary or inguinal LUNGS: clear to auscultation and percussion with normal breathing effort HEART: regular rate & rhythm and no murmurs and no lower extremity edema ABDOMEN:abdomen soft, non-tender and normal bowel sounds MUSCULOSKELETAL:no cyanosis of digits and no clubbing  NEURO: alert & oriented x 3 with fluent speech, no focal  motor/sensory deficits EXTREMITIES: No lower extremity edema  LABORATORY DATA:  I have reviewed the data as listed CMP Latest Ref Rng & Units 01/06/2018 11/25/2017 10/14/2017  Glucose 70 - 140 mg/dL 165(H) 132 197(H)  BUN 7 - 26 mg/dL _0 Creatinine 0.60 - 1.10 mg/dL 0.74 0.70 0.75  Sodium 136 - 145 mmol/L 144 143 144  Potassium 3.5 - 5.1 mmol/L 3.4(L) 3.9 3.3(L)  Chloride 98 - 109 mmol/L 108 110(H) 107  CO2 22 - 29 mmol/L _1 Calcium 8.4 - 10.4 mg/dL 9.2 9.1 9.4  Total Protein 6.4 - 8.3 g/dL 6.6 6.1(L) 6.5  Total Bilirubin 0.2 - 1.2 mg/dL 0.6 0.4 0.5  Alkaline Phos 40 - 150 U/L 67 73 74  AST 5 - 34 U/L _2 ALT 0 - 55 U/L _3 Lab Results  Component Value Date   WBC 4.4 01/06/2018   HGB 12.4 01/06/2018   HCT 37.4 01/06/2018   MCV 96.1 01/06/2018   PLT 149 01/06/2018   NEUTROABS 3.6 01/06/2018    ASSESSMENT & PLAN:  Malignant neoplasm of overlapping  sites of both breasts in female, estrogen receptor positive ([place]) Right simple mastectomy: IDC grade 1, 1.1 cm, DCIS low-grade, margins negative, 0/2 lymph nodes negative; ER 95%, PR 95%, HER-2 negative Ki-67 15%T1 cN0 stage I a left simple mastectomy: IDC grade 3, 6.2 cm, 4/13 lymph nodes positive, margins negative, ER 5 %, PR 0%, HER-2 positive ratio 2.04, Ki-67 50 % T3 N2 stage IIIa Echocardiogram 09/24/2017: EF 50%   Recommendation: 1. Adjuvant Herceptin started 10/14/17 2.  Adjuvant radiation therapy started 10/23/17-11/25/17 3. adjuvant antiestrogen therapy started February 2019 --------------------------------------------------  Herceptin Toxicities: Tolerating it well  Anti estrogen therapy with Letrozole she is tolerating it extremely well. Denies any hot flashes or arthralgias or myalgias.  Return to clinic every 3 weeks for Herceptin every 6 weeks for follow-up with me.      No orders of the defined types were placed in this encounter.  The patient has a good understanding of the overall  plan. she agrees with it. she will call with any problems that may develop before the next visit here.   Harriette Ohara, MD 01/06/18

## 2018-01-06 NOTE — Assessment & Plan Note (Signed)
Right simple mastectomy: IDC grade 1, 1.1 cm, DCIS low-grade, margins negative, 0/2 lymph nodes negative; ER 95%, PR 95%, HER-2 negative Ki-67 15%T1 cN0 stage I a left simple mastectomy: IDC grade 3, 6.2 cm, 4/13 lymph nodes positive, margins negative, ER 5 %, PR 0%, HER-2 positive ratio 2.04, Ki-67 50 % T3 N2 stage IIIa Echocardiogram 09/24/2017: EF 50%   Recommendation: 1. Adjuvant Herceptin started 10/14/17 2.  Adjuvant radiation therapy started 10/23/17-11/25/17 3. adjuvant antiestrogen therapy started February 2019 --------------------------------------------------  Herceptin Toxicities: Tolerating it well  Anti estrogen therapy with Letrozole she is tolerating it extremely well. Denies any hot flashes or arthralgias or myalgias.  Return to clinic every 3 weeks for Herceptin every 6 weeks for follow-up with me.

## 2018-01-13 ENCOUNTER — Other Ambulatory Visit (HOSPITAL_COMMUNITY): Payer: Self-pay

## 2018-01-13 ENCOUNTER — Ambulatory Visit (HOSPITAL_BASED_OUTPATIENT_CLINIC_OR_DEPARTMENT_OTHER)
Admission: RE | Admit: 2018-01-13 | Discharge: 2018-01-13 | Disposition: A | Discharge status: Home / Self Care | Payer: Medicare Other | Source: Ambulatory Visit | Attending: Cardiology | Admitting: Cardiology

## 2018-01-13 ENCOUNTER — Other Ambulatory Visit: Payer: Self-pay

## 2018-01-13 ENCOUNTER — Encounter (HOSPITAL_COMMUNITY): Payer: Self-pay | Admitting: Cardiology

## 2018-01-13 ENCOUNTER — Ambulatory Visit (HOSPITAL_COMMUNITY)
Admission: RE | Admit: 2018-01-13 | Discharge: 2018-01-13 | Disposition: A | Discharge status: Home / Self Care | Payer: Medicare Other | Source: Ambulatory Visit | Attending: Cardiology | Admitting: Cardiology

## 2018-01-13 VITALS — BP 166/71 | HR 65 | Wt 165.5 lb

## 2018-01-13 DIAGNOSIS — I5042 Chronic combined systolic (congestive) and diastolic (congestive) heart failure: Secondary | ICD-10-CM

## 2018-01-13 DIAGNOSIS — C50212 Malignant neoplasm of upper-inner quadrant of left female breast: Secondary | ICD-10-CM

## 2018-01-13 DIAGNOSIS — I080 Rheumatic disorders of both mitral and aortic valves: Secondary | ICD-10-CM | POA: Insufficient documentation

## 2018-01-13 DIAGNOSIS — I429 Cardiomyopathy, unspecified: Secondary | ICD-10-CM | POA: Insufficient documentation

## 2018-01-13 DIAGNOSIS — I825Z2 Chronic embolism and thrombosis of unspecified deep veins of left distal lower extremity: Secondary | ICD-10-CM

## 2018-01-13 DIAGNOSIS — Z170 Estrogen receptor positive status [ER+]: Secondary | ICD-10-CM

## 2018-01-13 MED ORDER — CARVEDILOL 6.25 MG PO TABS: 9.3750 mg | ORAL_TABLET | Freq: Two times a day (BID) | ORAL | 3 refills | Status: DC

## 2018-01-13 MED ORDER — FUROSEMIDE 80 MG PO TABS: 80.0000 mg | ORAL_TABLET | Freq: Every day | ORAL | 3 refills | Status: DC

## 2018-01-13 NOTE — Patient Instructions (Signed)
Stop IMDUR  Increase Carvedilol 9.375 mg (1.5 tabs), twice a day  Increase Furosemide 80 mg (1 tab) daily   Your physician has requested that you have an echocardiogram. Echocardiography is a painless test that uses sound waves to create images of your heart. It provides your doctor with information about the size and shape of your heart and how well your heart's chambers and valves are working. This procedure takes approximately one hour. There are no restrictions for this procedure.  Your physician has requested that you have a cardiac MRI. Cardiac MRI uses a computer to create images of your heart as its beating, producing both still and moving pictures of your heart and major blood vessels. For further information please visit [email or web address] Please follow the instruction sheet given to you today for more information. (they will call you)    Wear Compression stocking daily  Decrease Sodium in diet   Your physician recommends that you return for lab work in: 10 days  Your physician recommends that you schedule a follow-up appointment in: 6 weeks with Dr. Aundra Dubin  an a echocardiogram

## 2018-01-13 NOTE — Progress Notes (Signed)
  Echocardiogram 2D Echocardiogram has been performed.  Charlene Rubio L Androw 01/13/2018, 10:42 AM

## 2018-01-14 NOTE — Progress Notes (Signed)
PCP: [name] Oncology: [name] Cardiology: Dr. Claiborne Billings HF Cardiology: Dr. Aundra Dubin  72 yo with history of breast cancer, chronic systolic CHF, chronic left leg DVT, and dementia was referred by [name] for cardio-oncology evaluation given Herceptin use.   Patient went a number of years without accessing medical care.  However, in 9/18, she was admitted with dyspnea and was found to have a CHF exacerbation.  Echo showed EF 25-30%.  She was diuresed.  Cardiolite in 10/18 showed EF 41% with no ischemia.  Blood glucose was also noted to be very high this admission.  She was then diagnosed with breast cancer and had bilateral mastectomies in 1/19.  ER+/PR+/HER2+. Planned for Herceptin starting 2/19 to continue for a year.  Radiation 3/19-4/19.   She had an echo in 2/19 suggesting improved EF, 50-55%.  However, repeat echo today was reviewed and showed EF 45% with mild AS and moderate AI, normal RV size and systolic function.    Most of history comes from her husband.  She is interactive but has a poor memory.  She has left knee pain so does not walk much.  No chest pain. She does get short of breath walking for more than about 5 minutes.  Dyspnea with stairs.  No orthopnea/PND.  Weight is up 4-5 lbs over the last several weeks.  BP is elevated, husband says that it tends to run high in doctors' offices.    ECG (2/19, personally reviewed): NSR, ?old ASMI.   Labs (5/19): K 3.4, creatinine 0.74, hgb 12.4  PMH: 1. Breast cancer: Bilateral mastectomies in 1/19.  ER+/PR+/HER2+. Planned for Herceptin starting 2/19 to continue for a year.  Radiation 3/19-4/19.  2. Type II diabetes 3. Chronic DVT left leg: Found in 9/18.  4. Dementia 5. HTN 6. Chronic systolic CHF: ?Etiology.  - Echo (9/18): EF 25-30% - Cardiolite (10/18): EF 41%, no ischemia.  - Echo (2/19): EF 50-55%, mild LV dilation, mild LVH, mild AS, mild-moderate AI.  - Echo (5/19): EF 45%, diffuse hypokinesis, normal RV size and systolic  function, mild AS with moderate AI.   FH: Father with CHF, dementia.  Brother with CAD s/p PCI.   Social History   Socioeconomic History  . Marital status: Married    Spouse name: Not on file  . Number of children: Not on file  . Years of education: Not on file  . Highest education level: Not on file  Occupational History  . Occupation: retired Engineer, structural  . Financial resource strain: Not hard at all  . Food insecurity:    Worry: Never true    Inability: Never true  . Transportation needs:    Medical: No    Non-medical: No  Tobacco Use  . Smoking status: Never Smoker  . Smokeless tobacco: Never Used  Substance and Sexual Activity  . Alcohol use: No  . Drug use: No  . Sexual activity: Not Currently  Lifestyle  . Physical activity:    Days per week: 0 days    Minutes per session: 0 min  . Stress: Only a little  Relationships  . Social connections:    Talks on phone: More than three times a week    Gets together: More than three times a week    Attends religious service: Never    Active member of club or organization: No    Attends meetings of clubs or organizations: Never    Relationship status: Married  . Intimate partner violence:  Fear of current or ex partner: No    Emotionally abused: No    Physically abused: No    Forced sexual activity: No  Other Topics Concern  . Not on file  Social History Narrative   Social History   Diet?    Do you drink/eat things with caffeine? Yes (minimal) coke   Marital status?          married                          What year were you married? 1968   Do you live in a house, apartment, assisted living, condo, trailer, etc.? house   Is it one or more stories? one   How many persons live in your home? 2   Do you have any pets in your home? (please list) none   Highest level of education completed?   Current or past profession: preschool teacher/nanny   Do you exercise?         0                             Type  & how often?   Advanced Directives   Do you have a living will?   Do you have a DNR form?                                  If not, do you want to discuss one?   Do you have signed POA/HPOA for forms?    Functional Status   Do you have difficulty bathing or dressing yourself?   Do you have difficulty preparing food or eating?    Do you have difficulty managing your medications?   Do you have difficulty managing your finances?   Do you have difficulty affording your medications?    Admitted to [facility] 05/01/17   Never smoked   Alcohol none   DNR      ROS: All systems reviewed and negative except as per HPI.  Current Outpatient Medications  Medication Sig Dispense Refill  . aspirin EC 81 MG EC tablet Take 1 tablet (81 mg total) by mouth daily.    . carvedilol (COREG) 6.25 MG tablet Take 1.5 tablets (9.375 mg total) by mouth 2 (two) times daily with a meal. 270 tablet 3  . donepezil (ARICEPT) 10 MG tablet Take 1 tablet (10 mg total) by mouth at bedtime. 30 tablet 6  . ELIQUIS 5 MG TABS tablet TAKE 1 TABLET BY MOUTH TWICE A DAY 60 tablet 4  . furosemide (LASIX) 80 MG tablet Take 1 tablet (80 mg total) by mouth daily. 30 tablet 3  . hyaluronate sodium (RADIAPLEXRX) GEL Apply 1 application topically 2 (two) times daily.    Marland Kitchen letrozole (FEMARA) 2.5 MG tablet Take 1 tablet (2.5 mg total) by mouth daily. 90 tablet 3  . lidocaine-prilocaine (EMLA) cream Apply to affected area once 30 g 3  . lisinopril (PRINIVIL,ZESTRIL) 20 MG tablet Take 1.5 tablets (30 mg total) by mouth daily. 135 tablet 3  . metFORMIN (GLUCOPHAGE-XR) 500 MG 24 hr tablet Take 1 tablet (500 mg total) by mouth 2 (two) times daily with a meal. 180 tablet 1  . Multiple Vitamins-Minerals (ADULT GUMMY PO) Take 1 tablet by mouth 3 (three) times a week.    . non-metallic deodorant Jethro Poling) MISC  Apply 1 application topically daily as needed.     No current facility-administered medications for this encounter.    BP (!)  166/71   Pulse 65   Wt 165 lb 8 oz (75.1 kg)   LMP  (LMP Unknown)   SpO2 99%   BMI 26.71 kg/m  General: NAD Neck: JVP 8-9 cm, no thyromegaly or thyroid nodule.  Lungs: Clear to auscultation bilaterally with normal respiratory effort. CV: Nondisplaced PMI.  Heart regular S1/S2, no S3/S4, 2/6 SEM RUSB, 1/6 diastolic murmur sternal border. 1+ edema to knees, L>R.  No carotid bruit.  Normal pedal pulses.  Abdomen: Soft, nontender, no hepatosplenomegaly, no distention.  Skin: Intact without lesions or rashes.  Neurologic: Alert and oriented x 3.  Psych: Normal affect. Extremities: No clubbing or cyanosis.  HEENT: Normal.   Assessment/Plan: 1. Chronic left leg DVT: She is on Eliquis.  With cancer diagnosis, will continue chronically.  2. Chronic systolic CHF: Cardiomyopathy of uncertain etiology. First diagnosed in 9/18 with CHF exacerbation, EF by echo at the time was 25-30%.  Cardiolite in 10/18 showed no ischemia, EF 41%.  Echo in 2/19 was read as showing improved EF, 50-55%.  I reviewed today's echo, EF appears to be about 45%.  On exam, she is volume overloaded and weight has gone up recently.  NYHA class II-III symptoms.   - Cardiac MRI to assess for evidence of prior MI or infiltrative disease.  - Cut back on sodium in diet.  - Wear compression stockings during the day, take off at night.  - Change Lasix to 80 mg daily.  Her husband thinks she would have a hard time taking Lasix bid.  - Start spironolactone 12.5 mg daily.  - Continue lisinopril 30 mg daily.  - Increase Coreg to 9.375 mg bid.  - BMET was done recently and will be repeated in 10 days.   - D/c Imdur (do not think this med is necessary).  - Herceptin treatment continuation is a difficult question.  Low EF clearly predates Herceptin use.  EF is clearly low today, 45%.  Prior echo from 2/19 really does not seem markedly different from today's echo.  Currently, I would favor continue Herceptin for now with very close  monitoring, repeating echo in 6 weeks.  If any further fall in EF will need to stop Herceptin.  3. Aortic valve disorder: Echo 5/19 with mild AS, mild to moderate AI.  Follow for now.   Loralie Champagne 01/14/2018

## 2018-01-23 ENCOUNTER — Ambulatory Visit (HOSPITAL_COMMUNITY)
Admission: RE | Admit: 2018-01-23 | Discharge: 2018-01-23 | Disposition: A | Discharge status: Home / Self Care | Payer: Medicare Other | Source: Ambulatory Visit | Attending: Cardiology | Admitting: Cardiology

## 2018-01-23 DIAGNOSIS — I429 Cardiomyopathy, unspecified: Secondary | ICD-10-CM | POA: Diagnosis not present

## 2018-01-23 LAB — BASIC METABOLIC PANEL
Anion gap: 10 (ref 5–15)
BUN: 18 mg/dL (ref 6–20)
CHLORIDE: 109 mmol/L (ref 101–111)
CO2: 25 mmol/L (ref 22–32)
Calcium: 8.9 mg/dL (ref 8.9–10.3)
Creatinine, Ser: 0.79 mg/dL (ref 0.44–1.00)
GFR calc non Af Amer: >60 mL/min (ref >60)
Glucose, Bld: 218 mg/dL — ABNORMAL HIGH (ref 65–99)
POTASSIUM: 3.4 mmol/L — AB (ref 3.5–5.1)
SODIUM: 144 mmol/L (ref 135–145)

## 2018-01-27 ENCOUNTER — Inpatient Hospital Stay: Payer: Medicare Other | Attending: Hematology and Oncology

## 2018-01-27 DIAGNOSIS — Z5112 Encounter for antineoplastic immunotherapy: Secondary | ICD-10-CM | POA: Diagnosis not present

## 2018-01-27 DIAGNOSIS — C773 Secondary and unspecified malignant neoplasm of axilla and upper limb lymph nodes: Secondary | ICD-10-CM | POA: Diagnosis not present

## 2018-01-27 DIAGNOSIS — C50811 Malignant neoplasm of overlapping sites of right female breast: Secondary | ICD-10-CM

## 2018-01-27 DIAGNOSIS — C50812 Malignant neoplasm of overlapping sites of left female breast: Secondary | ICD-10-CM | POA: Insufficient documentation

## 2018-01-27 DIAGNOSIS — Z170 Estrogen receptor positive status [ER+]: Secondary | ICD-10-CM

## 2018-01-27 MED ORDER — SODIUM CHLORIDE 0.9% FLUSH
10.0000 mL | INTRAVENOUS | Status: DC | PRN
  Administered 2018-01-27: 10 mL
  Filled 2018-01-27: qty 10

## 2018-01-27 MED ORDER — ACETAMINOPHEN 325 MG PO TABS
650.0000 mg | ORAL_TABLET | Freq: Once | ORAL | Status: AC
  Administered 2018-01-27: 650 mg via ORAL

## 2018-01-27 MED ORDER — TRASTUZUMAB CHEMO 150 MG IV SOLR
450.0000 mg | Freq: Once | INTRAVENOUS | Status: AC
  Administered 2018-01-27: 450 mg via INTRAVENOUS
  Filled 2018-01-27: qty 21.4

## 2018-01-27 MED ORDER — SODIUM CHLORIDE 0.9 % IV SOLN
Freq: Once | INTRAVENOUS | Status: AC
  Administered 2018-01-27: 11:00:00 via INTRAVENOUS

## 2018-01-27 MED ORDER — HEPARIN SOD (PORK) LOCK FLUSH 100 UNIT/ML IV SOLN
500.0000 [IU] | Freq: Once | INTRAVENOUS | Status: AC | PRN
  Administered 2018-01-27: 500 [IU]
  Filled 2018-01-27: qty 5

## 2018-01-27 MED ORDER — DIPHENHYDRAMINE HCL 25 MG PO CAPS
50.0000 mg | ORAL_CAPSULE | Freq: Once | ORAL | Status: AC
  Administered 2018-01-27: 50 mg via ORAL

## 2018-01-27 MED ORDER — ACETAMINOPHEN 325 MG PO TABS
ORAL_TABLET | ORAL | Status: AC
  Filled 2018-01-27: qty 2

## 2018-01-27 MED ORDER — DIPHENHYDRAMINE HCL 25 MG PO CAPS
ORAL_CAPSULE | ORAL | Status: AC
  Filled 2018-01-27: qty 2

## 2018-01-27 NOTE — Patient Instructions (Signed)
[place] Discharge Instructions for Patients Receiving Chemotherapy  Today you received the following chemotherapy agents: trastuzumab (Herceptin).  To help prevent nausea and vomiting after your treatment, we encourage you to take your nausea medication as prescribed.  If you develop nausea and vomiting that is not controlled by your nausea medication, call the clinic.   BELOW ARE SYMPTOMS THAT SHOULD BE REPORTED IMMEDIATELY:  *FEVER GREATER THAN 100.5 F  *CHILLS WITH OR WITHOUT FEVER  NAUSEA AND VOMITING THAT IS NOT CONTROLLED WITH YOUR NAUSEA MEDICATION  *UNUSUAL SHORTNESS OF BREATH  *UNUSUAL BRUISING OR BLEEDING  TENDERNESS IN MOUTH AND THROAT WITH OR WITHOUT PRESENCE OF ULCERS  *URINARY PROBLEMS  *BOWEL PROBLEMS  UNUSUAL RASH Items with * indicate a potential emergency and should be followed up as soon as possible.  Feel free to call the clinic should you have any questions or concerns. The clinic phone number is [phone].  Please show the [place] at check-in to the Emergency Department and triage nurse.

## 2018-02-13 ENCOUNTER — Other Ambulatory Visit: Payer: Self-pay | Admitting: *Deleted

## 2018-02-13 ENCOUNTER — Other Ambulatory Visit: Payer: Self-pay

## 2018-02-13 MED ORDER — LETROZOLE 2.5 MG PO TABS: 2.5000 mg | ORAL_TABLET | Freq: Every day | ORAL | 3 refills | Status: DC

## 2018-02-13 MED ORDER — CARVEDILOL 6.25 MG PO TABS: 9.3750 mg | ORAL_TABLET | Freq: Two times a day (BID) | ORAL | 3 refills | Status: DC

## 2018-02-13 MED ORDER — FUROSEMIDE 80 MG PO TABS: 80.0000 mg | ORAL_TABLET | Freq: Every day | ORAL | 3 refills | Status: DC

## 2018-02-16 ENCOUNTER — Other Ambulatory Visit: Payer: Self-pay

## 2018-02-16 DIAGNOSIS — Z170 Estrogen receptor positive status [ER+]: Principal | ICD-10-CM

## 2018-02-16 DIAGNOSIS — C50812 Malignant neoplasm of overlapping sites of left female breast: Principal | ICD-10-CM

## 2018-02-16 DIAGNOSIS — C50811 Malignant neoplasm of overlapping sites of right female breast: Secondary | ICD-10-CM

## 2018-02-17 ENCOUNTER — Other Ambulatory Visit: Payer: Self-pay | Admitting: *Deleted

## 2018-02-17 ENCOUNTER — Ambulatory Visit: Payer: Medicare Other

## 2018-02-17 ENCOUNTER — Inpatient Hospital Stay: Payer: Medicare Other

## 2018-02-17 ENCOUNTER — Other Ambulatory Visit: Payer: Medicare Other

## 2018-02-17 ENCOUNTER — Inpatient Hospital Stay: Payer: Medicare Other | Admitting: Hematology and Oncology

## 2018-02-17 ENCOUNTER — Inpatient Hospital Stay: Payer: Medicare Other | Attending: Hematology and Oncology

## 2018-02-17 ENCOUNTER — Ambulatory Visit: Payer: Medicare Other | Admitting: Hematology and Oncology

## 2018-02-17 DIAGNOSIS — C50812 Malignant neoplasm of overlapping sites of left female breast: Secondary | ICD-10-CM | POA: Insufficient documentation

## 2018-02-17 DIAGNOSIS — Z170 Estrogen receptor positive status [ER+]: Secondary | ICD-10-CM | POA: Insufficient documentation

## 2018-02-17 DIAGNOSIS — R197 Diarrhea, unspecified: Secondary | ICD-10-CM

## 2018-02-17 DIAGNOSIS — C773 Secondary and unspecified malignant neoplasm of axilla and upper limb lymph nodes: Secondary | ICD-10-CM | POA: Insufficient documentation

## 2018-02-17 DIAGNOSIS — Z5112 Encounter for antineoplastic immunotherapy: Secondary | ICD-10-CM | POA: Insufficient documentation

## 2018-02-17 DIAGNOSIS — Z79899 Other long term (current) drug therapy: Secondary | ICD-10-CM | POA: Diagnosis not present

## 2018-02-17 DIAGNOSIS — C50212 Malignant neoplasm of upper-inner quadrant of left female breast: Secondary | ICD-10-CM

## 2018-02-17 DIAGNOSIS — C50811 Malignant neoplasm of overlapping sites of right female breast: Secondary | ICD-10-CM

## 2018-02-17 DIAGNOSIS — I10 Essential (primary) hypertension: Secondary | ICD-10-CM

## 2018-02-17 DIAGNOSIS — Z95828 Presence of other vascular implants and grafts: Secondary | ICD-10-CM

## 2018-02-17 LAB — CBC WITH DIFFERENTIAL (CANCER CENTER ONLY)
Basophils Absolute: 0.1 10*3/uL (ref 0.0–0.1)
Basophils Relative: 1 %
EOS PCT: 3 %
Eosinophils Absolute: 0.1 10*3/uL (ref 0.0–0.5)
HEMATOCRIT: 36.7 % (ref 34.8–46.6)
Hemoglobin: 12.2 g/dL (ref 11.6–15.9)
LYMPHS PCT: 8 %
Lymphs Abs: 0.4 10*3/uL — ABNORMAL LOW (ref 0.9–3.3)
MCH: 31.8 pg (ref 25.1–34.0)
MCHC: 33.2 g/dL (ref 31.5–36.0)
MCV: 95.6 fL (ref 79.5–101.0)
MONO ABS: 0.3 10*3/uL (ref 0.1–0.9)
Monocytes Relative: 7 %
Neutro Abs: 3.6 10*3/uL (ref 1.5–6.5)
Neutrophils Relative %: 81 %
PLATELETS: 151 10*3/uL (ref 145–400)
RBC: 3.84 MIL/uL (ref 3.70–5.45)
RDW: 13.5 % (ref 11.2–14.5)
WBC: 4.5 10*3/uL (ref 3.9–10.3)

## 2018-02-17 LAB — CMP (CANCER CENTER ONLY)
ALBUMIN: 3.9 g/dL (ref 3.5–5.0)
ALT: 7 U/L (ref 0–44)
AST: 11 U/L — AB (ref 15–41)
Alkaline Phosphatase: 77 U/L (ref 38–126)
Anion gap: 10 (ref 5–15)
BUN: 21 mg/dL (ref 8–23)
CO2: 27 mmol/L (ref 22–32)
CREATININE: 0.81 mg/dL (ref 0.44–1.00)
Calcium: 9.5 mg/dL (ref 8.9–10.3)
Chloride: 106 mmol/L (ref 98–111)
GFR, Est AFR Am: >60 mL/min (ref >60)
GLUCOSE: 172 mg/dL — AB (ref 70–99)
POTASSIUM: 3.4 mmol/L — AB (ref 3.5–5.1)
Sodium: 143 mmol/L (ref 135–145)
Total Bilirubin: 0.5 mg/dL (ref 0.3–1.2)
Total Protein: 6.4 g/dL — ABNORMAL LOW (ref 6.5–8.1)

## 2018-02-17 MED ORDER — DIPHENHYDRAMINE HCL 25 MG PO CAPS
50.0000 mg | ORAL_CAPSULE | Freq: Once | ORAL | Status: AC
  Administered 2018-02-17: 50 mg via ORAL

## 2018-02-17 MED ORDER — ACETAMINOPHEN 325 MG PO TABS
650.0000 mg | ORAL_TABLET | Freq: Once | ORAL | Status: AC
  Administered 2018-02-17: 650 mg via ORAL

## 2018-02-17 MED ORDER — METFORMIN HCL ER 500 MG PO TB24: 500.0000 mg | ORAL_TABLET | Freq: Two times a day (BID) | ORAL | 0 refills | Status: DC

## 2018-02-17 MED ORDER — TRASTUZUMAB CHEMO 150 MG IV SOLR
450.0000 mg | Freq: Once | INTRAVENOUS | Status: AC
  Administered 2018-02-17: 450 mg via INTRAVENOUS
  Filled 2018-02-17: qty 21.43

## 2018-02-17 MED ORDER — SODIUM CHLORIDE 0.9% FLUSH
10.0000 mL | INTRAVENOUS | Status: DC | PRN
  Administered 2018-02-17: 10 mL
  Filled 2018-02-17: qty 10

## 2018-02-17 MED ORDER — ACETAMINOPHEN 325 MG PO TABS
ORAL_TABLET | ORAL | Status: AC
  Filled 2018-02-17: qty 2

## 2018-02-17 MED ORDER — DIPHENHYDRAMINE HCL 25 MG PO CAPS
ORAL_CAPSULE | ORAL | Status: AC
  Filled 2018-02-17: qty 2

## 2018-02-17 MED ORDER — SODIUM CHLORIDE 0.9 % IV SOLN
Freq: Once | INTRAVENOUS | Status: AC
  Administered 2018-02-17: 12:00:00 via INTRAVENOUS

## 2018-02-17 MED ORDER — LISINOPRIL 20 MG PO TABS: 30.0000 mg | ORAL_TABLET | Freq: Every day | ORAL | 0 refills | Status: DC

## 2018-02-17 MED ORDER — HEPARIN SOD (PORK) LOCK FLUSH 100 UNIT/ML IV SOLN
500.0000 [IU] | Freq: Once | INTRAVENOUS | Status: AC | PRN
  Administered 2018-02-17: 500 [IU]
  Filled 2018-02-17: qty 5

## 2018-02-17 NOTE — Assessment & Plan Note (Signed)
Right simple mastectomy: IDC grade 1, 1.1 cm, DCIS low-grade, margins negative, 0/2 lymph nodes negative; ER 95%, PR 95%, HER-2 negative Ki-67 15%T1 cN0 stage I a left simple mastectomy: IDC grade 3, 6.2 cm, 4/13 lymph nodes positive, margins negative, ER 5 %, PR 0%, HER-2 positive ratio 2.04, Ki-67 50 % T3 N2 stage IIIa Echocardiogram 09/24/2017: EF 50%   Recommendation: 1. Adjuvant Herceptin started 10/14/17 2.  Adjuvant radiation therapy started 10/23/17-11/25/17 3. adjuvant antiestrogen therapy started February 2019 --------------------------------------------------  Herceptin Toxicities: Tolerating it well  Anti estrogen therapy with Letrozole she is tolerating it extremely well. Denies any hot flashes or arthralgias or myalgias.  Return to clinic every 3 weeks for Herceptin every 6 weeks for follow-up with me.

## 2018-02-17 NOTE — Patient Instructions (Signed)
[facility] Discharge Instructions for Patients Receiving Chemotherapy  Today you received the following chemotherapy agents Herceptin  To help prevent nausea and vomiting after your treatment, we encourage you to take your nausea medication as directed   If you develop nausea and vomiting that is not controlled by your nausea medication, call the clinic.   BELOW ARE SYMPTOMS THAT SHOULD BE REPORTED IMMEDIATELY:  *FEVER GREATER THAN 100.5 F  *CHILLS WITH OR WITHOUT FEVER  NAUSEA AND VOMITING THAT IS NOT CONTROLLED WITH YOUR NAUSEA MEDICATION  *UNUSUAL SHORTNESS OF BREATH  *UNUSUAL BRUISING OR BLEEDING  TENDERNESS IN MOUTH AND THROAT WITH OR WITHOUT PRESENCE OF ULCERS  *URINARY PROBLEMS  *BOWEL PROBLEMS  UNUSUAL RASH Items with * indicate a potential emergency and should be followed up as soon as possible.  Feel free to call the clinic should you have any questions or concerns. The clinic phone number is [phone].  Please show the CHEMO ALERT CARD at check-in to the Emergency Department and triage nurse.   

## 2018-02-17 NOTE — Progress Notes (Signed)
Patient Care Team: [name], DO as PCP - General (Internal Medicine)  DIAGNOSIS:  Encounter Diagnosis  Name Primary?  . Malignant neoplasm of upper-inner quadrant of left breast in female, estrogen receptor positive ([place])     SUMMARY OF ONCOLOGIC HISTORY:   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, estrogen receptor positive ([place])   06/23/2017 Initial Diagnosis    Left breast 11 o'clock position 6.3 x 4.2 x 6.1 cm mass with thick papillary projections, 2 adjacent left axillary lymph nodes; right breast 930 position 1.7 cm mass; left breast and left lymph node biopsy revealed IDC grade 3, ER 5%, PR 0%, Ki-67 50%, HER-2 positive ratio 2.04 T3N1 stage IIIa; right breast: Grade 1 IDC  ER 95%, PR 95%, Ki-67 15%, HER-2 negative; T1CN0 stage I a      08/27/2017 Surgery    Right simple mastectomy: IDC grade 1, 1.1 cm, DCIS low-grade, margins negative, 0/2 lymph nodes negative; ER 95%, PR 95%, HER-2 negative Ki-67 15% T1 cN0 stage I a left simple mastectomy: IDC grade 3, 6.2 cm, 4/13 lymph nodes positive, margins negative, ER 5 %, PR 0%, HER-2 positive ratio 2.04, Ki-67 50 % T3 N2 stage IIIa       10/14/2017 -  Chemotherapy    Adjuvant Herceptin alone for 1 year       10/23/2017 - 11/25/2017 Radiation Therapy    Adjuvant radiation therapy      12/05/2017 Genetic Testing    The Common Hereditary Cancer Panel offered by [facility] includes sequencing and/or deletion duplication testing of the following 47 genes: APC, ATM, AXIN2, BARD1, BMPR1A, BRCA1, BRCA2, BRIP1, CDH1, CDKN2A (p14ARF), CDKN2A (p16INK4a), CKD4, CHEK2, CTNNA1, DICER1, EPCAM (Deletion/duplication testing only), GREM1 (promoter region deletion/duplication testing only), KIT, MEN1, MLH1, MSH2, MSH3, MSH6, MUTYH, NBN, NF1, NHTL1, PALB2, PDGFRA, PMS2, POLD1, POLE, PTEN, RAD50, RAD51C, RAD51D, SDHB, SDHC, SDHD, SMAD4, SMARCA4. STK11, TP53, TSC1, TSC2, and VHL.  The following genes were evaluated for sequence  changes only: SDHA and HOXB13 c.251G>A variant only.  Results: Negative, no pathogenic variants identified.  The date of this test report is 12/05/2017.        CHIEF COMPLIANT: Follow-up on Herceptin  INTERVAL HISTORY: Charlene Rubio is a 72 year old with above-mentioned history of right breast cancer treated with the bilateral mastectomies and is currently on adjuvant Herceptin.  She has had intermittent loose stools after she eats food but otherwise tolerating Herceptin extremely well.  Her husband is her primary caregiver.  REVIEW OF SYSTEMS:   Constitutional: Denies fevers, chills or abnormal weight loss Eyes: Denies blurriness of vision Ears, nose, mouth, throat, and face: Denies mucositis or sore throat Respiratory: Denies cough, dyspnea or wheezes Cardiovascular: Denies palpitation, chest discomfort Gastrointestinal:  Denies nausea, heartburn or change in bowel habits Skin: Denies abnormal skin rashes Lymphatics: Denies new lymphadenopathy or easy bruising Neurological:Denies numbness, tingling or new weaknesses Behavioral/Psych: Mood is stable, no new changes  Extremities: No lower extremity edema  All other systems were reviewed with the patient and are negative.  I have reviewed the past medical history, past surgical history, social history and family history with the patient and they are unchanged from previous note.  ALLERGIES:  has No Known Allergies.  MEDICATIONS:  Current Outpatient Medications  Medication Sig Dispense Refill  . aspirin EC 81 MG EC tablet Take 1 tablet (81 mg total) by mouth daily.    . carvedilol (COREG) 6.25 MG tablet Take 1.5 tablets (9.375 mg total) by mouth  2 (two) times daily with a meal. 270 tablet 3  . donepezil (ARICEPT) 10 MG tablet Take 1 tablet (10 mg total) by mouth at bedtime. 30 tablet 6  . ELIQUIS 5 MG TABS tablet TAKE 1 TABLET BY MOUTH TWICE A DAY 60 tablet 4  . furosemide (LASIX) 80 MG tablet Take 1 tablet (80 mg total) by mouth  daily. 30 tablet 3  . hyaluronate sodium (RADIAPLEXRX) GEL Apply 1 application topically 2 (two) times daily.    Marland Kitchen letrozole (FEMARA) 2.5 MG tablet Take 1 tablet (2.5 mg total) by mouth daily. 90 tablet 3  . lidocaine-prilocaine (EMLA) cream Apply to affected area once 30 g 3  . lisinopril (PRINIVIL,ZESTRIL) 20 MG tablet Take 1.5 tablets (30 mg total) by mouth daily. 135 tablet 3  . metFORMIN (GLUCOPHAGE-XR) 500 MG 24 hr tablet Take 1 tablet (500 mg total) by mouth 2 (two) times daily with a meal. 180 tablet 1  . Multiple Vitamins-Minerals (ADULT GUMMY PO) Take 1 tablet by mouth 3 (three) times a week.    . non-metallic deodorant Jethro Poling) MISC Apply 1 application topically daily as needed.     No current facility-administered medications for this visit.     PHYSICAL EXAMINATION: ECOG PERFORMANCE STATUS: 1 - Symptomatic but completely ambulatory  Vitals:   02/17/18 1051  BP: (!) 152/44  Pulse: 60  Resp: 18  Temp: 98.2 F (36.8 C)  SpO2: 97%   Filed Weights   02/17/18 1051  Weight: 160 lb 6.4 oz (72.8 kg)    GENERAL:alert, no distress and comfortable SKIN: skin color, texture, turgor are normal, no rashes or significant lesions EYES: normal, Conjunctiva are pink and non-injected, sclera clear OROPHARYNX:no exudate, no erythema and lips, buccal mucosa, and tongue normal  NECK: supple, thyroid normal size, non-tender, without nodularity LYMPH:  no palpable lymphadenopathy in the cervical, axillary or inguinal LUNGS: clear to auscultation and percussion with normal breathing effort HEART: regular rate & rhythm and no murmurs and no lower extremity edema ABDOMEN:abdomen soft, non-tender and normal bowel sounds MUSCULOSKELETAL:no cyanosis of digits and no clubbing  NEURO: alert & oriented x 3 with fluent speech, no focal motor/sensory deficits EXTREMITIES: No lower extremity edema  LABORATORY DATA:  I have reviewed the data as listed CMP Latest Ref Rng & Units 02/17/2018 01/23/2018  01/06/2018  Glucose 70 - 99 mg/dL 172(H) 218(H) 165(H)  BUN 8 - 23 mg/dL _0 Creatinine 0.44 - 1.00 mg/dL 0.81 0.79 0.74  Sodium 135 - 145 mmol/L 143 144 144  Potassium 3.5 - 5.1 mmol/L 3.4(L) 3.4(L) 3.4(L)  Chloride 98 - 111 mmol/L 106 109 108  CO2 22 - 32 mmol/L _1 Calcium 8.9 - 10.3 mg/dL 9.5 8.9 9.2  Total Protein 6.5 - 8.1 g/dL 6.4(L) - 6.6  Total Bilirubin 0.3 - 1.2 mg/dL 0.5 - 0.6  Alkaline Phos 38 - 126 U/L 77 - 67  AST 15 - 41 U/L 11(L) - 15  ALT 0 - 44 U/L 7 - 10    Lab Results  Component Value Date   WBC 4.5 02/17/2018   HGB 12.2 02/17/2018   HCT 36.7 02/17/2018   MCV 95.6 02/17/2018   PLT 151 02/17/2018   NEUTROABS 3.6 02/17/2018    ASSESSMENT & PLAN:  Malignant neoplasm of upper-inner quadrant of left breast in female, estrogen receptor positive ([facility]) Right simple mastectomy: IDC grade 1, 1.1 cm, DCIS low-grade, margins negative, 0/2 lymph nodes negative; ER 95%, PR 95%, HER-2 negative  Ki-67 15%T1 cN0 stage I a left simple mastectomy: IDC grade 3, 6.2 cm, 4/13 lymph nodes positive, margins negative, ER 5 %, PR 0%, HER-2 positive ratio 2.04, Ki-67 50 % T3 N2 stage IIIa Echocardiogram 09/24/2017: EF 50%   Recommendation: 1. Adjuvant Herceptin started 10/14/17 2.  Adjuvant radiation therapy started 10/23/17-11/25/17 3. adjuvant antiestrogen therapy started February 2019 --------------------------------------------------  Herceptin Toxicities: Tolerating it well  Anti estrogen therapy with Letrozole she is tolerating it extremely well. Denies any hot flashes or arthralgias or myalgias.  Return to clinic every 3 weeks for Herceptin every 6 weeks for follow-up with me.      No orders of the defined types were placed in this encounter.  The patient has a good understanding of the overall plan. she agrees with it. she will call with any problems that may develop before the next visit here.   Harriette Ohara, MD 02/17/18

## 2018-03-02 ENCOUNTER — Ambulatory Visit (HOSPITAL_BASED_OUTPATIENT_CLINIC_OR_DEPARTMENT_OTHER)
Admission: RE | Admit: 2018-03-02 | Discharge: 2018-03-02 | Disposition: A | Discharge status: Home / Self Care | Payer: Medicare Other | Source: Ambulatory Visit | Attending: Cardiology | Admitting: Cardiology

## 2018-03-02 ENCOUNTER — Ambulatory Visit (HOSPITAL_COMMUNITY)
Admission: RE | Admit: 2018-03-02 | Discharge: 2018-03-02 | Disposition: A | Discharge status: Home / Self Care | Payer: Medicare Other | Source: Ambulatory Visit | Attending: Cardiology | Admitting: Cardiology

## 2018-03-02 ENCOUNTER — Encounter (HOSPITAL_COMMUNITY): Payer: Self-pay | Admitting: Cardiology

## 2018-03-02 VITALS — BP 148/78 | HR 52 | Wt 161.1 lb

## 2018-03-02 DIAGNOSIS — Z7901 Long term (current) use of anticoagulants: Secondary | ICD-10-CM | POA: Diagnosis not present

## 2018-03-02 DIAGNOSIS — C50212 Malignant neoplasm of upper-inner quadrant of left female breast: Secondary | ICD-10-CM

## 2018-03-02 DIAGNOSIS — I5022 Chronic systolic (congestive) heart failure: Secondary | ICD-10-CM | POA: Diagnosis not present

## 2018-03-02 DIAGNOSIS — I429 Cardiomyopathy, unspecified: Secondary | ICD-10-CM | POA: Diagnosis not present

## 2018-03-02 DIAGNOSIS — Z79899 Other long term (current) drug therapy: Secondary | ICD-10-CM | POA: Diagnosis not present

## 2018-03-02 DIAGNOSIS — Z86718 Personal history of other venous thrombosis and embolism: Secondary | ICD-10-CM | POA: Diagnosis not present

## 2018-03-02 DIAGNOSIS — C50919 Malignant neoplasm of unspecified site of unspecified female breast: Secondary | ICD-10-CM | POA: Diagnosis not present

## 2018-03-02 DIAGNOSIS — I252 Old myocardial infarction: Secondary | ICD-10-CM | POA: Insufficient documentation

## 2018-03-02 DIAGNOSIS — E119 Type 2 diabetes mellitus without complications: Secondary | ICD-10-CM | POA: Diagnosis not present

## 2018-03-02 DIAGNOSIS — I110 Hypertensive heart disease with heart failure: Secondary | ICD-10-CM | POA: Diagnosis not present

## 2018-03-02 DIAGNOSIS — Z170 Estrogen receptor positive status [ER+]: Secondary | ICD-10-CM | POA: Insufficient documentation

## 2018-03-02 DIAGNOSIS — I5042 Chronic combined systolic (congestive) and diastolic (congestive) heart failure: Secondary | ICD-10-CM | POA: Diagnosis not present

## 2018-03-02 DIAGNOSIS — Z9013 Acquired absence of bilateral breasts and nipples: Secondary | ICD-10-CM | POA: Insufficient documentation

## 2018-03-02 DIAGNOSIS — Z7984 Long term (current) use of oral hypoglycemic drugs: Secondary | ICD-10-CM | POA: Insufficient documentation

## 2018-03-02 DIAGNOSIS — Z923 Personal history of irradiation: Secondary | ICD-10-CM | POA: Diagnosis not present

## 2018-03-02 DIAGNOSIS — F0280 Dementia in other diseases classified elsewhere without behavioral disturbance: Secondary | ICD-10-CM | POA: Insufficient documentation

## 2018-03-02 MED ORDER — SACUBITRIL-VALSARTAN 24-26 MG PO TABS: 1.0000 | ORAL_TABLET | Freq: Two times a day (BID) | ORAL | 3 refills | Status: DC

## 2018-03-02 MED ORDER — CARVEDILOL 12.5 MG PO TABS: 12.5000 mg | ORAL_TABLET | Freq: Two times a day (BID) | ORAL | 3 refills | Status: DC

## 2018-03-02 NOTE — Progress Notes (Signed)
  Echocardiogram 2D Echocardiogram has been performed.  Charlene Rubio 03/02/2018, 10:00 AM

## 2018-03-02 NOTE — Patient Instructions (Signed)
Stop Lisinopril  Stop Asprin  Start Entresto 24/26 mg (1 tab), twice a day  Increase Carvedilol 12.5 mg (1 tab), twice a day  Your physician has requested that you have a cardiac MRI. Cardiac MRI uses a computer to create images of your heart as its beating, producing both still and moving pictures of your heart and major blood vessels. For further information please visit [email or web address] Please follow the instruction sheet given to you today for more information.  Your physician has requested that you have an echocardiogram. Echocardiography is a painless test that uses sound waves to create images of your heart. It provides your doctor with information about the size and shape of your heart and how well your heart's chambers and valves are working. This procedure takes approximately one hour. There are no restrictions for this procedure.  Your physician recommends that you return for lab work in: 10 days   Your physician recommends that you schedule a follow-up appointment in: 2 months with Dr. Aundra Dubin   an a echocardiogram

## 2018-03-02 NOTE — Progress Notes (Signed)
PCP: [name] Oncology: [name] Cardiology: Dr. Claiborne Billings HF Cardiology: Dr. Aundra Dubin  72 yo with history of breast cancer, chronic systolic CHF, chronic left leg DVT, and dementia was referred by [name] for cardio-oncology evaluation given Herceptin use.   Patient went a number of years without accessing medical care.  However, in 9/18, she was admitted with dyspnea and was found to have a CHF exacerbation.  Echo showed EF 25-30%.  She was diuresed.  Cardiolite in 10/18 showed EF 41% with no ischemia.  Blood glucose was also noted to be very high this admission.  She was then diagnosed with breast cancer and had bilateral mastectomies in 1/19.  ER+/PR+/HER2+. Planned for Herceptin starting 2/19 to continue until 11/19.  Radiation 3/19-4/19.   She had an echo in 2/19 suggesting improved EF, 50-55%.  However, repeat echo in 5/19 showed EF 45% with mild AS and moderate AI, normal RV size and systolic function.  Echo today was reviewed, EF 45% with diffuse hypokinesis, normal RV, moderate AI/mild AS.   Most of history comes from her husband.  She is interactive but has a poor memory. Very little activity at baseline per husband.  Able to walk around house and into office today without dyspnea. Decreased leg swelling.  No complaint of chest pain. No orthopnea/PND. Weight down 4 lbs.    Labs (5/19): K 3.4, creatinine 0.74, hgb 12.4 Labs (7/19): K 3.4, creatinine 0.81  PMH: 1. Breast cancer: Bilateral mastectomies in 1/19.  ER+/PR+/HER2+. Planned for Herceptin starting 2/19 to continue for a year.  Radiation 3/19-4/19.  2. Type II diabetes 3. Chronic DVT left leg: Found in 9/18.  4. Dementia 5. HTN 6. Chronic systolic CHF: ?Etiology.  - Echo (9/18): EF 25-30% - Cardiolite (10/18): EF 41%, no ischemia.  - Echo (2/19): EF 50-55%, mild LV dilation, mild LVH, mild AS, mild-moderate AI.  - Echo (5/19): EF 45%, diffuse hypokinesis, normal RV size and systolic function, mild AS with moderate AI.   - Echo (7/19): EF 45%, mild LV dilation, diffuse hypokinesis, normal RV, moderate AI/mild AS.   FH: Father with CHF, dementia.  Brother with CAD s/p PCI.   Social History   Socioeconomic History  . Marital status: Married    Spouse name: Not on file  . Number of children: Not on file  . Years of education: Not on file  . Highest education level: Not on file  Occupational History  . Occupation: retired Engineer, structural  . Financial resource strain: Not hard at all  . Food insecurity:    Worry: Never true    Inability: Never true  . Transportation needs:    Medical: No    Non-medical: No  Tobacco Use  . Smoking status: Never Smoker  . Smokeless tobacco: Never Used  Substance and Sexual Activity  . Alcohol use: No  . Drug use: No  . Sexual activity: Not Currently  Lifestyle  . Physical activity:    Days per week: 0 days    Minutes per session: 0 min  . Stress: Only a little  Relationships  . Social connections:    Talks on phone: More than three times a week    Gets together: More than three times a week    Attends religious service: Never    Active member of club or organization: No    Attends meetings of clubs or organizations: Never    Relationship status: Married  . Intimate partner violence:    Fear  of current or ex partner: No    Emotionally abused: No    Physically abused: No    Forced sexual activity: No  Other Topics Concern  . Not on file  Social History Narrative   Social History   Diet?    Do you drink/eat things with caffeine? Yes (minimal) coke   Marital status?          married                          What year were you married? 1968   Do you live in a house, apartment, assisted living, condo, trailer, etc.? house   Is it one or more stories? one   How many persons live in your home? 2   Do you have any pets in your home? (please list) none   Highest level of education completed?   Current or past profession: preschool teacher/nanny    Do you exercise?         0                             Type & how often?   Advanced Directives   Do you have a living will?   Do you have a DNR form?                                  If not, do you want to discuss one?   Do you have signed POA/HPOA for forms?    Functional Status   Do you have difficulty bathing or dressing yourself?   Do you have difficulty preparing food or eating?    Do you have difficulty managing your medications?   Do you have difficulty managing your finances?   Do you have difficulty affording your medications?    Admitted to [facility] 05/01/17   Never smoked   Alcohol none   DNR      ROS: All systems reviewed and negative except as per HPI.  Current Outpatient Medications  Medication Sig Dispense Refill  . carvedilol (COREG) 12.5 MG tablet Take 1 tablet (12.5 mg total) by mouth 2 (two) times daily with a meal. 60 tablet 3  . donepezil (ARICEPT) 10 MG tablet Take 1 tablet (10 mg total) by mouth at bedtime. 30 tablet 6  . ELIQUIS 5 MG TABS tablet TAKE 1 TABLET BY MOUTH TWICE A DAY 60 tablet 4  . furosemide (LASIX) 80 MG tablet Take 1 tablet (80 mg total) by mouth daily. 30 tablet 3  . letrozole (FEMARA) 2.5 MG tablet Take 1 tablet (2.5 mg total) by mouth daily. 90 tablet 3  . metFORMIN (GLUCOPHAGE-XR) 500 MG 24 hr tablet Take 1 tablet (500 mg total) by mouth 2 (two) times daily with a meal. 180 tablet 0  . Multiple Vitamins-Minerals (ADULT GUMMY PO) Take 1 tablet by mouth 3 (three) times a week.    . sacubitril-valsartan (ENTRESTO) 24-26 MG Take 1 tablet by mouth 2 (two) times daily. 60 tablet 3   No current facility-administered medications for this encounter.    BP (!) 148/78   Pulse (!) 52   Wt 161 lb 1.9 oz (73.1 kg)   LMP  (LMP Unknown)   SpO2 97%   BMI 26.01 kg/m  General: NAD Neck: No JVD, no thyromegaly or thyroid nodule.  Lungs: Clear to auscultation bilaterally with normal respiratory effort. CV: Nondisplaced PMI.  Heart regular  S1/S2, no S3/S4, 2/6 SEM RUSB with clear S2.  1+ ankle edema.  No carotid bruit.  Normal pedal pulses.  Abdomen: Soft, nontender, no hepatosplenomegaly, no distention.  Skin: Intact without lesions or rashes.  Neurologic: Alert and oriented x 3.  Psych: Normal affect. Extremities: No clubbing or cyanosis.  HEENT: Normal.   Assessment/Plan: 1. Chronic left leg DVT: She is on Eliquis.  With cancer diagnosis, will continue chronically.   - She can stop ASA given Eliquis use.  2. Chronic systolic CHF: Cardiomyopathy of uncertain etiology. First diagnosed in 9/18 with CHF exacerbation, EF by echo at the time was 25-30%.  Cardiolite in 10/18 showed no ischemia, EF 41%.  Echo in 2/19 was read as showing improved EF, 50-55%.  Echo in 5/19 showed EF about 45%.  Echo done today was reviewed, shows EF 45% with diffuse hypokinesis.  Weight is down and she does not appear volume overloaded today. Probably NYHA class II symptoms.   - Cardiac MRI to assess for evidence of prior MI or infiltrative disease => waiting for precertification. - Wear compression stockings during the day, take off at night.  - Continue Lasix 80 mg daily.    - Increase Coreg to 12.5 mg bid.  - Stop lisinopril, start Entresto 24/26 bid. BMET 10 days. - Low EF clearly predates Herceptin use.  EF remains 45%.  Currently, I would favor continuing Herceptin for now with very close monitoring, repeating echo in 2 months.  If any further fall in EF, will need to stop Herceptin.  3. Aortic valve disorder: Echo 7/19 with mild AS, moderate AI.  Follow for now.   Followup in 2 months with echo.   Loralie Champagne 03/02/2018

## 2018-03-03 ENCOUNTER — Telehealth: Payer: Self-pay | Admitting: Cardiology

## 2018-03-03 ENCOUNTER — Other Ambulatory Visit: Payer: Self-pay | Admitting: *Deleted

## 2018-03-03 NOTE — Telephone Encounter (Signed)
Called patient and LVM to call back with the time of day she would like to have her cardiac MRI scheduled.

## 2018-03-06 ENCOUNTER — Other Ambulatory Visit: Payer: Self-pay | Admitting: *Deleted

## 2018-03-06 DIAGNOSIS — R413 Other amnesia: Secondary | ICD-10-CM

## 2018-03-06 MED ORDER — DONEPEZIL HCL 10 MG PO TABS: 10.0000 mg | ORAL_TABLET | Freq: Every day | ORAL | 1 refills | Status: DC

## 2018-03-06 MED ORDER — METFORMIN HCL ER 500 MG PO TB24: 500.0000 mg | ORAL_TABLET | Freq: Two times a day (BID) | ORAL | 1 refills | Status: DC

## 2018-03-06 NOTE — Telephone Encounter (Signed)
[facility] Refill Request

## 2018-03-09 ENCOUNTER — Telehealth: Payer: Self-pay | Admitting: Cardiology

## 2018-03-09 IMAGING — DX DG CHEST 1V PORT
1 series · 1 of 1 positions shown · non-contrast
Comparison: 04/28/2017 chest radiograph.

CLINICAL DATA: 71 y/o  F; Port-A-Cath in place.

EXAM:
PORTABLE CHEST 1 VIEW

[chest ap]
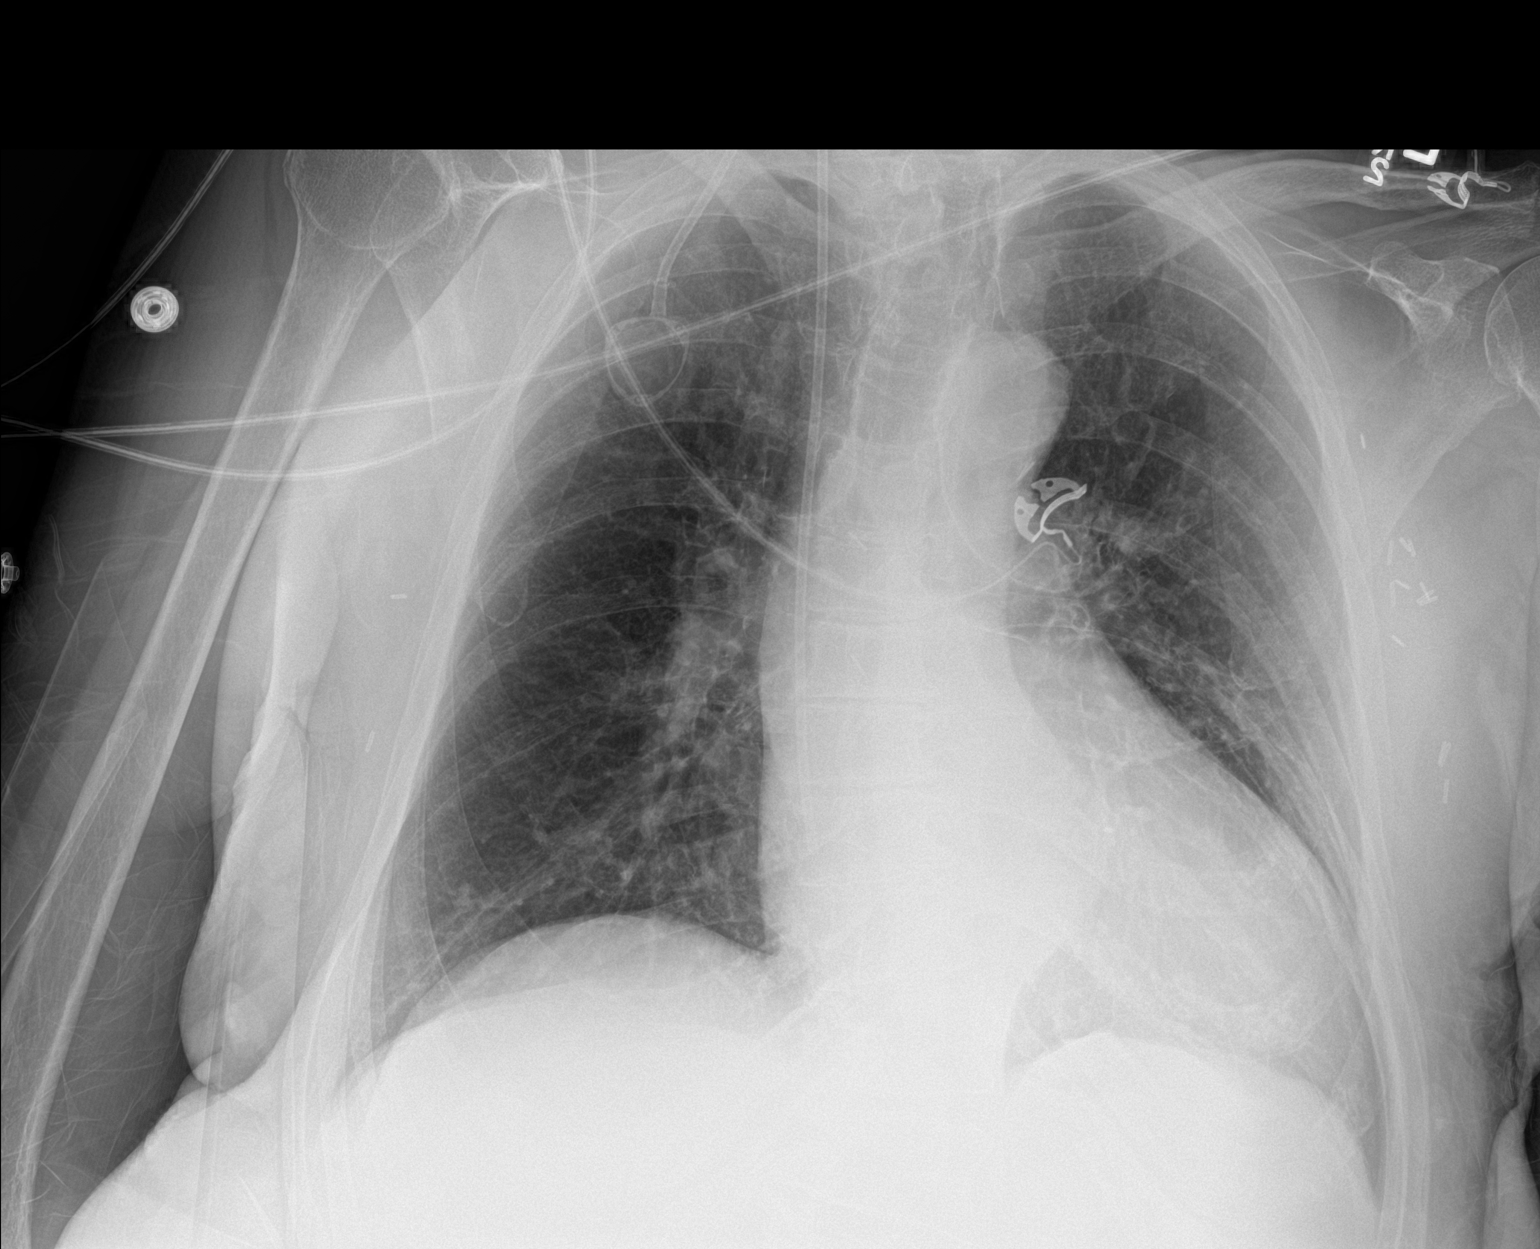

[1 of 1 positions shown; findings below may reference images not displayed]

FINDINGS: Stable cardiomegaly given projection and technique. Aortic
atherosclerosis with calcification. Right port catheter tip projects
over cavoatrial junction. Surgical clips project over bilateral
axilla. No focal consolidation. No pleural effusion or pneumothorax.
No acute osseous abnormality is evident.
IMPRESSION: Right port catheter tip projects over cavoatrial junction. No acute
pulmonary process identified.

By: Vipera Kieffer M.D.

## 2018-03-09 NOTE — Telephone Encounter (Signed)
Called patient and LVM to call back with the best time of day she would like her cardiac MRI scheduled.

## 2018-03-10 ENCOUNTER — Other Ambulatory Visit: Payer: Self-pay | Admitting: Oncology

## 2018-03-10 ENCOUNTER — Inpatient Hospital Stay: Payer: Medicare Other

## 2018-03-10 VITALS — BP 147/54 | HR 63 | Temp 97.7°F | Resp 18 | Ht 66.0 in | Wt 163.8 lb

## 2018-03-10 DIAGNOSIS — C773 Secondary and unspecified malignant neoplasm of axilla and upper limb lymph nodes: Secondary | ICD-10-CM | POA: Diagnosis not present

## 2018-03-10 DIAGNOSIS — Z5112 Encounter for antineoplastic immunotherapy: Secondary | ICD-10-CM | POA: Diagnosis not present

## 2018-03-10 DIAGNOSIS — Z170 Estrogen receptor positive status [ER+]: Principal | ICD-10-CM

## 2018-03-10 DIAGNOSIS — C50811 Malignant neoplasm of overlapping sites of right female breast: Secondary | ICD-10-CM

## 2018-03-10 DIAGNOSIS — Z79899 Other long term (current) drug therapy: Secondary | ICD-10-CM | POA: Diagnosis not present

## 2018-03-10 DIAGNOSIS — C50812 Malignant neoplasm of overlapping sites of left female breast: Secondary | ICD-10-CM | POA: Diagnosis not present

## 2018-03-10 MED ORDER — SODIUM CHLORIDE 0.9% FLUSH
10.0000 mL | INTRAVENOUS | Status: DC | PRN
  Administered 2018-03-10: 10 mL
  Filled 2018-03-10: qty 10

## 2018-03-10 MED ORDER — DIPHENHYDRAMINE HCL 25 MG PO CAPS
50.0000 mg | ORAL_CAPSULE | Freq: Once | ORAL | Status: AC
  Administered 2018-03-10: 50 mg via ORAL

## 2018-03-10 MED ORDER — ACETAMINOPHEN 325 MG PO TABS
ORAL_TABLET | ORAL | Status: AC
  Filled 2018-03-10: qty 2

## 2018-03-10 MED ORDER — DIPHENHYDRAMINE HCL 25 MG PO CAPS
ORAL_CAPSULE | ORAL | Status: AC
Start: 2018-03-10 — End: ?
  Filled 2018-03-10: qty 2

## 2018-03-10 MED ORDER — TRASTUZUMAB CHEMO 150 MG IV SOLR
450.0000 mg | Freq: Once | INTRAVENOUS | Status: AC
  Administered 2018-03-10: 450 mg via INTRAVENOUS
  Filled 2018-03-10: qty 21.43

## 2018-03-10 MED ORDER — HEPARIN SOD (PORK) LOCK FLUSH 100 UNIT/ML IV SOLN
500.0000 [IU] | Freq: Once | INTRAVENOUS | Status: AC | PRN
  Administered 2018-03-10: 500 [IU]
  Filled 2018-03-10: qty 5

## 2018-03-10 MED ORDER — SODIUM CHLORIDE 0.9 % IV SOLN
Freq: Once | INTRAVENOUS | Status: AC
  Administered 2018-03-10: 11:00:00 via INTRAVENOUS

## 2018-03-10 MED ORDER — ACETAMINOPHEN 325 MG PO TABS
650.0000 mg | ORAL_TABLET | Freq: Once | ORAL | Status: AC
  Administered 2018-03-10: 650 mg via ORAL

## 2018-03-10 NOTE — Patient Instructions (Signed)
[facility] Discharge Instructions for Patients Receiving Chemotherapy  Today you received the following chemotherapy agents Herceptin  To help prevent nausea and vomiting after your treatment, we encourage you to take your nausea medication as directed   If you develop nausea and vomiting that is not controlled by your nausea medication, call the clinic.   BELOW ARE SYMPTOMS THAT SHOULD BE REPORTED IMMEDIATELY:  *FEVER GREATER THAN 100.5 F  *CHILLS WITH OR WITHOUT FEVER  NAUSEA AND VOMITING THAT IS NOT CONTROLLED WITH YOUR NAUSEA MEDICATION  *UNUSUAL SHORTNESS OF BREATH  *UNUSUAL BRUISING OR BLEEDING  TENDERNESS IN MOUTH AND THROAT WITH OR WITHOUT PRESENCE OF ULCERS  *URINARY PROBLEMS  *BOWEL PROBLEMS  UNUSUAL RASH Items with * indicate a potential emergency and should be followed up as soon as possible.  Feel free to call the clinic should you have any questions or concerns. The clinic phone number is [phone].  Please show the CHEMO ALERT CARD at check-in to the Emergency Department and triage nurse.   

## 2018-03-10 NOTE — Progress Notes (Signed)
Per Dr. Jana Hakim, ok to proceed with today's treatment.  Echo on 03/02/18 listed EF as 45%. Pt seen by Dr. Aundra Dubin and stated ok for pt to continue Herceptin treatment.

## 2018-03-10 NOTE — Progress Notes (Unsigned)
I was called regarding this patient's ejection fraction of 45%.  She is receiving Herceptin today.  I reviewed Dr. Claris Gladden note from 03/02/2018, which states "low EF clearly predates Herceptin use.  EF remains 45%.  Currently, I would favor continuing Herceptin for now with very close monitoring, repeating echo in 2 months.  If any further fall in EF, will need to stop Herceptin."  Accordingly we are proceeding with Herceptin today

## 2018-03-13 ENCOUNTER — Ambulatory Visit (HOSPITAL_COMMUNITY)
Admission: RE | Admit: 2018-03-13 | Discharge: 2018-03-13 | Disposition: A | Discharge status: Home / Self Care | Payer: Medicare Other | Source: Ambulatory Visit | Attending: Cardiology | Admitting: Cardiology

## 2018-03-13 DIAGNOSIS — Z170 Estrogen receptor positive status [ER+]: Secondary | ICD-10-CM | POA: Insufficient documentation

## 2018-03-13 DIAGNOSIS — C50212 Malignant neoplasm of upper-inner quadrant of left female breast: Secondary | ICD-10-CM | POA: Diagnosis not present

## 2018-03-13 LAB — BASIC METABOLIC PANEL
Anion gap: 11 (ref 5–15)
BUN: 15 mg/dL (ref 8–23)
CHLORIDE: 107 mmol/L (ref 98–111)
CO2: 24 mmol/L (ref 22–32)
CREATININE: 0.65 mg/dL (ref 0.44–1.00)
Calcium: 9 mg/dL (ref 8.9–10.3)
GFR calc non Af Amer: >60 mL/min (ref >60)
GLUCOSE: 142 mg/dL — AB (ref 70–99)
Potassium: 3.4 mmol/L — ABNORMAL LOW (ref 3.5–5.1)
Sodium: 142 mmol/L (ref 135–145)

## 2018-03-16 ENCOUNTER — Telehealth (HOSPITAL_COMMUNITY): Payer: Self-pay | Admitting: *Deleted

## 2018-03-16 ENCOUNTER — Other Ambulatory Visit: Payer: Medicare Other

## 2018-03-16 MED ORDER — POTASSIUM CHLORIDE CRYS ER 20 MEQ PO TBCR: 20.0000 meq | EXTENDED_RELEASE_TABLET | Freq: Every day | ORAL | 3 refills | Status: DC

## 2018-03-16 NOTE — Telephone Encounter (Signed)
-----   Message from Larey Dresser, MD sent at 03/13/2018  1:39 PM EDT ----- Increase total daily KCl by 20 mEq.

## 2018-03-17 ENCOUNTER — Other Ambulatory Visit: Payer: Medicare Other

## 2018-03-17 DIAGNOSIS — E118 Type 2 diabetes mellitus with unspecified complications: Secondary | ICD-10-CM | POA: Diagnosis not present

## 2018-03-18 LAB — LIPID PANEL
Cholesterol: 157 mg/dL
HDL: 58 mg/dL
LDL Cholesterol (Calc): 79 mg/dL
Non-HDL Cholesterol (Calc): 99 mg/dL
Total CHOL/HDL Ratio: 2.7 (calc)
Triglycerides: 116 mg/dL

## 2018-03-18 LAB — HEMOGLOBIN A1C
Hgb A1c MFr Bld: 6.3 % of total Hgb — ABNORMAL HIGH (ref <5.7)
MEAN PLASMA GLUCOSE: 134 (calc)
eAG (mmol/L): 7.4 (calc)

## 2018-03-20 ENCOUNTER — Ambulatory Visit (INDEPENDENT_AMBULATORY_CARE_PROVIDER_SITE_OTHER): Payer: Medicare Other | Admitting: Internal Medicine

## 2018-03-20 ENCOUNTER — Encounter: Payer: Self-pay | Admitting: Internal Medicine

## 2018-03-20 VITALS — BP 140/68 | HR 62 | Temp 98.0°F | Ht 66.0 in | Wt 162.0 lb

## 2018-03-20 DIAGNOSIS — Z1159 Encounter for screening for other viral diseases: Secondary | ICD-10-CM

## 2018-03-20 DIAGNOSIS — E118 Type 2 diabetes mellitus with unspecified complications: Secondary | ICD-10-CM | POA: Diagnosis not present

## 2018-03-20 DIAGNOSIS — Z9189 Other specified personal risk factors, not elsewhere classified: Secondary | ICD-10-CM

## 2018-03-20 DIAGNOSIS — I825Y2 Chronic embolism and thrombosis of unspecified deep veins of left proximal lower extremity: Secondary | ICD-10-CM

## 2018-03-20 DIAGNOSIS — C50912 Malignant neoplasm of unspecified site of left female breast: Secondary | ICD-10-CM

## 2018-03-20 DIAGNOSIS — C50911 Malignant neoplasm of unspecified site of right female breast: Secondary | ICD-10-CM | POA: Diagnosis not present

## 2018-03-20 DIAGNOSIS — I5042 Chronic combined systolic (congestive) and diastolic (congestive) heart failure: Secondary | ICD-10-CM

## 2018-03-20 DIAGNOSIS — F0390 Unspecified dementia without behavioral disturbance: Secondary | ICD-10-CM

## 2018-03-20 DIAGNOSIS — I359 Nonrheumatic aortic valve disorder, unspecified: Secondary | ICD-10-CM

## 2018-03-20 NOTE — Progress Notes (Signed)
Patient ID: Charlene Rubio, female   DOB: 07/03/1946, 72 y.o.   MRN: [id number]   Location:  [facility] OFFICE  Provider: DR Arletha Grippe  Code Status:  Goals of Care:  Advanced Directives 12/09/2017  Does Patient Have a Medical Advance Directive? No  Type of Advance Directive -  Does patient want to make changes to medical advance directive? -  Would patient like information on creating a medical advance directive? -  Pre-existing out of facility DNR order (yellow form or pink MOST form) -     Chief Complaint  Patient presents with  . Medical Management of Chronic Issues    4 Month follow-up on DM, HTN, and Breast Cancer. DM Foot Exam Due. Discuss labs (copy printed) Here with Husband   . Health Maintenance    Discuss screening options for colon cancer, discuss screening for Hep C    HPI: Patient is a 72 y.o. female seen today for medical management of chronic diseases.  She reports feeling better overall. No bleeding or easy bruising. She is compliant with provider appts and meds. Spouse is present. She is a poor historian due to dementia. Hx obtained from chart. Lab results form 03/17/18 reviewed with pt  B/l breast cancer - invasive ductal cell and DCIS with (+) left LN mets; She underwent left breast MRM and right total mastectomy with sentinel node bx on 08/27/17. Followed by surgery and H/O (Dr Lindi Adie). She is taking femara. She had port-a-cath placed in 09/2017 and started chemotx (herceptin) in Feb 2019. She completed 31 XRT (Dr Isidore Moos)  HTN - BP suboptimally controlled on lisinopril, coreg, imdur, lasix. She is on eliquis and ASA  Dementia - stable on aricept. She has not seen neurology Dr [name] in awhile due to multiple Dr appts  DM - controlled on metformin. A1c 6.3%. No low BS reactions. 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 No bleeding or easy bruising. She has significant swelling. She does not wear TED stockings on a regular basis this summer.  Mild  AS/mod AR/combined s/d CHF - followed by cardio Dr Aundra Dubin. She takes coreg, lasix with KCl, entresto. EF 45% (July 2019) with diffuse hypokinesis, mod DD, mild MR, mod dilated LA. She is awaiting MRI heart for further eval.    Past Medical History:  Diagnosis Date  . Acute diastolic (congestive) heart failure ([place]) 04/25/2017  . Acute respiratory failure with hypoxia and hypercapnia ([place]) 04/25/2017  . ALLERGIC RHINITIS 07/01/2007   Qualifier: Diagnosis of  By: Sherren Mocha MD, Jory Ee   . Allergy   . Aortic insufficiency   . Aortic valve disorder 07/01/2007   Qualifier: Diagnosis of  By: Sherren Mocha MD, Jory Ee   . Arthritis   . Bilateral leg edema 04/25/2017  . Borderline diabetic   . Cancer [facility])    Breast cancer  . CHF (congestive heart failure) ([place]) 04/25/2017  . Dementia 04/30/2017  . Diabetes mellitus ([place]) 04/25/2017  . DOE (dyspnea on exertion) 04/25/2017  . Essential hypertension 07/01/2007   Qualifier: Diagnosis of  By: Sherren Mocha MD, Jory Ee Glaucoma suspect   . Hypertension   . KNEE PAIN, LEFT 04/06/2010   Qualifier: Diagnosis of  By: Sherren Mocha MD, Jory Ee   . Nonischemic cardiomyopathy ([place])   . Obesity   . Obesity, unspecified 07/01/2007   Qualifier: Diagnosis of  By: Sherren Mocha MD, Jory Ee   . Peripheral vascular disease ([place])    blood clots in  legs  . Thyroid disease     Past Surgical History:  Procedure Laterality Date  . CESAREAN SECTION    . MASTECTOMY  08/27/2017   LEFT BREAST MODIFIED RADICAL MASTECTOMY (Left Breast)  . MASTECTOMY  08/27/2017   RIGHT TOTAL MASTECTOMY WITH SENTINEL LYMPH NODE BIOPSY (Right Breast)  . MASTECTOMY W/ SENTINEL NODE BIOPSY Right 08/27/2017   Procedure: RIGHT TOTAL MASTECTOMY WITH SENTINEL LYMPH NODE BIOPSY;  Surgeon: Fanny Skates, MD;  Location: [place];  Service: General;  Laterality: Right;  . MODIFIED MASTECTOMY Left 08/27/2017   Procedure: LEFT BREAST MODIFIED RADICAL MASTECTOMY;  Surgeon: Fanny Skates, MD;  Location: [place];  Service: General;   Laterality: Left;  . PORTACATH PLACEMENT Right 10/09/2017  . PORTACATH PLACEMENT Right 10/09/2017   Procedure: INSERTION PORT-A-CATH WITH ULTRASOUND;  Surgeon: Fanny Skates, MD;  Location: [place];  Service: General;  Laterality: Right;     reports that she has never smoked. She has never used smokeless tobacco. She reports that she does not drink alcohol or use drugs. Social History   Socioeconomic History  . Marital status: Married    Spouse name: Not on file  . Number of children: Not on file  . Years of education: Not on file  . Highest education level: Not on file  Occupational History  . Occupation: retired Engineer, structural  . Financial resource strain: Not hard at all  . Food insecurity:    Worry: Never true    Inability: Never true  . Transportation needs:    Medical: No    Non-medical: No  Tobacco Use  . Smoking status: Never Smoker  . Smokeless tobacco: Never Used  Substance and Sexual Activity  . Alcohol use: No  . Drug use: No  . Sexual activity: Not Currently  Lifestyle  . Physical activity:    Days per week: 0 days    Minutes per session: 0 min  . Stress: Only a little  Relationships  . Social connections:    Talks on phone: More than three times a week    Gets together: More than three times a week    Attends religious service: Never    Active member of club or organization: No    Attends meetings of clubs or organizations: Never    Relationship status: Married  . Intimate partner violence:    Fear of current or ex partner: No    Emotionally abused: No    Physically abused: No    Forced sexual activity: No  Other Topics Concern  . Not on file  Social History Narrative   Social History   Diet?    Do you drink/eat things with caffeine? Yes (minimal) coke   Marital status?          married                          What year were you married? 1968   Do you live in a house, apartment, assisted living, condo, trailer, etc.? house   Is it one  or more stories? one   How many persons live in your home? 2   Do you have any pets in your home? (please list) none   Highest level of education completed?   Current or past profession: preschool teacher/nanny   Do you exercise?         0  Type & how often?   Advanced Directives   Do you have a living will?   Do you have a DNR form?                                  If not, do you want to discuss one?   Do you have signed POA/HPOA for forms?    Functional Status   Do you have difficulty bathing or dressing yourself?   Do you have difficulty preparing food or eating?    Do you have difficulty managing your medications?   Do you have difficulty managing your finances?   Do you have difficulty affording your medications?    Admitted to [place] 05/01/17   Never smoked   Alcohol none   DNR       Family History  Problem Relation Age of Onset  . Alzheimer's disease Mother 71  . Alzheimer's disease Father 106  . Congestive Heart Failure Father   . Alcoholism Brother   . Alzheimer's disease Brother   . Dementia Brother   . Alcoholism Brother   . Diabetes Brother     No Known Allergies  Outpatient Encounter Medications as of 03/20/2018  Medication Sig  . carvedilol (COREG) 12.5 MG tablet Take 1 tablet (12.5 mg total) by mouth 2 (two) times daily with a meal.  . donepezil (ARICEPT) 10 MG tablet Take 1 tablet (10 mg total) by mouth at bedtime.  Marland Kitchen ELIQUIS 5 MG TABS tablet TAKE 1 TABLET BY MOUTH TWICE A DAY  . furosemide (LASIX) 80 MG tablet Take 1 tablet (80 mg total) by mouth daily.  Marland Kitchen letrozole (FEMARA) 2.5 MG tablet Take 1 tablet (2.5 mg total) by mouth daily.  . metFORMIN (GLUCOPHAGE-XR) 500 MG 24 hr tablet Take 1 tablet (500 mg total) by mouth 2 (two) times daily with a meal.  . Multiple Vitamins-Minerals (ADULT GUMMY PO) Take 1 tablet by mouth 3 (three) times a week.  . potassium chloride SA (K-DUR,KLOR-CON) 20 MEQ tablet Take 1 tablet (20 mEq  total) by mouth daily.  . sacubitril-valsartan (ENTRESTO) 24-26 MG Take 1 tablet by mouth 2 (two) times daily.   No facility-administered encounter medications on file as of 03/20/2018.     Review of Systems:  Review of Systems  Unable to perform ROS: Dementia    Health Maintenance  Topic Date Due  . Hepatitis C Screening  [birth year]  . FOOT EXAM  12/22/1954  . COLONOSCOPY  12/22/1994  . URINE MICROALBUMIN  02/08/2010  . INFLUENZA VACCINE  03/19/2018  . DEXA SCAN  04/19/2018 (Originally 12/21/2009)  . OPHTHALMOLOGY EXAM  07/04/2018  . HEMOGLOBIN A1C  09/17/2018  . MAMMOGRAM  06/14/2019  . TETANUS/TDAP  04/06/2020  . PNA vac Low Risk Adult  Completed    Physical Exam: Vitals:   03/20/18 1033  BP: 140/68  Pulse: 62  Temp: 98 F (36.7 C)  TempSrc: Oral  SpO2: 97%  Weight: 162 lb (73.5 kg)  Height: 5\' 6"  (1.676 m)   Body mass index is 26.15 kg/m. Physical Exam  Constitutional: She is oriented to person, place, and time. She appears well-developed and well-nourished.  HENT:  Mouth/Throat: Oropharynx is clear and moist. No oropharyngeal exudate.  MMM; no oral thrush  Eyes: Pupils are equal, round, and reactive to light. No scleral icterus.  Neck: Neck supple. Carotid bruit is not present. No tracheal deviation present. No thyromegaly  present.  Cardiovascular: Normal rate, regular rhythm and intact distal pulses. Exam reveals no gallop and no friction rub.  Murmur heard.  Crescendo systolic (radiating to carotid b/l) murmur is present.  Decrescendo diastolic murmur is present with a grade of 1/6. +1 pitting LLE with palpable cord left calf; no RLE edema  Pulmonary/Chest: Effort normal and breath sounds normal. No stridor. No respiratory distress. She has no wheezes. She has no rales.  Abdominal: Soft. Normal appearance and bowel sounds are normal. She exhibits no distension and no mass. There is no hepatomegaly. There is no tenderness. There is no rigidity, no rebound and no  guarding. No hernia.  Musculoskeletal: She exhibits edema and deformity (LLE).  Lymphadenopathy:    She has no cervical adenopathy.  Neurological: She is alert and oriented to person, place, and time. She has normal reflexes.  Skin: Skin is warm and dry. No rash noted.  Psychiatric: She has a normal mood and affect. Her behavior is normal. Thought content normal.    Labs reviewed: Basic Metabolic Panel: Recent Labs    04/26/17 0455  04/28/17 0218  04/30/17 0525  05/09/17  01/23/18 1012 02/17/18 1021 03/13/18 1022  NA 144   < > 140   < > 138   < > 144   < > 144 143 142  K 3.2*   < > 3.6   < > 3.9   < > 3.8   < > 3.4* 3.4* 3.4*  CL 105   < > 98*   < > 96*  --   --    < > 109 106 107  CO2 28   < > 32   < > 33*  --   --    < > 25 27 24   GLUCOSE 145*   < > 68   < > 133*  --   --    < > 218* 172* 142*  BUN 25*   < > 22*   < > 21*   < > 15   < > 18 21 15   CREATININE 1.11*   < > 0.88   < > 0.89   < > 0.4*   < > 0.79 0.81 0.65  CALCIUM 8.4*   < > 8.0*   < > 8.2*  --   --    < > 8.9 9.5 9.0  MG  --   --  1.8  --  2.0  --   --   --   --   --   --   PHOS  --   --  3.5  --   --   --   --   --   --   --   --   TSH 2.447  --   --   --   --   --  4.03  --   --   --   --    < > = values in this interval not displayed.   Liver Function Tests: Recent Labs    11/25/17 1024 01/06/18 1013 02/17/18 1021  AST 13 15 11*  ALT 11 10 7   ALKPHOS 73 67 77  BILITOT 0.4 0.6 0.5  PROT 6.1* 6.6 6.4*  ALBUMIN 3.5 3.8 3.9   No results for input(s): LIPASE, AMYLASE in the last 8760 hours. Recent Labs    04/30/17 0525  AMMONIA 36*   CBC: Recent Labs    11/25/17 1024 01/06/18 1013 02/17/18 1021  WBC 4.6 4.4 4.5  NEUTROABS 3.4 3.6 3.6  HGB 12.1 12.4 12.2  HCT 37.0 37.4 36.7  MCV 96.4 96.1 95.6  PLT 151 149 151   Lipid Panel: Recent Labs    05/09/17 03/17/18 0807  CHOL 170 157  HDL 52 58  LDLCALC 95 79  TRIG 117 116  CHOLHDL  --  2.7   Lab Results  Component Value Date   HGBA1C 6.3  (H) 03/17/2018    Procedures since last visit: No results found.  Assessment/Plan   ICD-10-CM   1. Chronic deep vein thrombosis (DVT) of proximal vein of left lower extremity (HCC) I82.5Y2 VAS [place] LOWER EXTREMITY VENOUS (DVT)  2. Chronic combined systolic and diastolic congestive heart failure (HCC) I50.42   3. Type 2 diabetes mellitus with complication, without long-term current use of insulin (HCC) E11.8 Lipid Panel    Hemoglobin A1c  4. Bilateral malignant neoplasm of breast in female, unspecified estrogen receptor status, unspecified site of breast ([place]) C50.911    C50.912   5. Dementia without behavioral disturbance, unspecified dementia type F03.90   6. Aortic valve disorder I35.9   7. Encounter for hepatitis C virus screening test for high risk patient Z11.59 Hep C Antibody   Z91.89    Continue current medications as ordered  Will call with vascular US appt and results  Follow up with specialists as scheduled  [place]  Follow up in 4 mos with Janett Billow or sooner if need be. Fasting labs prior to appt     [place] S. Perlie Gold  [facility] [address] [place], [place] [address] [phone] Cell (Monday-Friday 8 AM - 5 PM) [phone] After 5 PM and follow prompts

## 2018-03-20 NOTE — Patient Instructions (Addendum)
Continue current medications as ordered  Will call with vascular US appt and results  Follow up with specialists as scheduled  [place]  Follow up in 4 mos with Janett Billow or sooner if need be. Fasting labs prior to appt

## 2018-03-21 LAB — MICROALBUMIN / CREATININE URINE RATIO
CREATININE, URINE: 120 mg/dL (ref 20–275)
MICROALB/CREAT RATIO: 6 ug/mg{creat} (ref <30)
Microalb, Ur: 0.7 mg/dL

## 2018-03-25 ENCOUNTER — Other Ambulatory Visit: Payer: Self-pay | Admitting: Internal Medicine

## 2018-03-31 ENCOUNTER — Ambulatory Visit: Payer: Medicare Other | Admitting: Hematology and Oncology

## 2018-03-31 ENCOUNTER — Other Ambulatory Visit: Payer: Medicare Other

## 2018-03-31 ENCOUNTER — Ambulatory Visit: Payer: Medicare Other

## 2018-03-31 ENCOUNTER — Inpatient Hospital Stay: Payer: Medicare Other

## 2018-03-31 ENCOUNTER — Inpatient Hospital Stay: Payer: Medicare Other | Attending: Hematology and Oncology

## 2018-03-31 ENCOUNTER — Inpatient Hospital Stay (HOSPITAL_BASED_OUTPATIENT_CLINIC_OR_DEPARTMENT_OTHER): Payer: Medicare Other | Admitting: Hematology and Oncology

## 2018-03-31 DIAGNOSIS — Z9013 Acquired absence of bilateral breasts and nipples: Secondary | ICD-10-CM | POA: Insufficient documentation

## 2018-03-31 DIAGNOSIS — C50812 Malignant neoplasm of overlapping sites of left female breast: Secondary | ICD-10-CM | POA: Insufficient documentation

## 2018-03-31 DIAGNOSIS — Z170 Estrogen receptor positive status [ER+]: Secondary | ICD-10-CM

## 2018-03-31 DIAGNOSIS — Z95828 Presence of other vascular implants and grafts: Secondary | ICD-10-CM

## 2018-03-31 DIAGNOSIS — C50811 Malignant neoplasm of overlapping sites of right female breast: Secondary | ICD-10-CM | POA: Diagnosis not present

## 2018-03-31 DIAGNOSIS — Z5112 Encounter for antineoplastic immunotherapy: Secondary | ICD-10-CM | POA: Diagnosis not present

## 2018-03-31 LAB — CMP (CANCER CENTER ONLY)
ALK PHOS: 77 U/L (ref 38–126)
ALT: 10 U/L (ref 0–44)
AST: 11 U/L — ABNORMAL LOW (ref 15–41)
Albumin: 3.8 g/dL (ref 3.5–5.0)
Anion gap: 13 (ref 5–15)
BUN: 18 mg/dL (ref 8–23)
CALCIUM: 9.1 mg/dL (ref 8.9–10.3)
CO2: 23 mmol/L (ref 22–32)
CREATININE: 0.78 mg/dL (ref 0.44–1.00)
Chloride: 107 mmol/L (ref 98–111)
GFR, Estimated: >60 mL/min (ref >60)
GLUCOSE: 179 mg/dL — AB (ref 70–99)
Potassium: 4.3 mmol/L (ref 3.5–5.1)
SODIUM: 143 mmol/L (ref 135–145)
Total Bilirubin: 0.6 mg/dL (ref 0.3–1.2)
Total Protein: 6.5 g/dL (ref 6.5–8.1)

## 2018-03-31 LAB — CBC WITH DIFFERENTIAL (CANCER CENTER ONLY)
BASOS PCT: 1 %
Basophils Absolute: 0 10*3/uL (ref 0.0–0.1)
EOS ABS: 0.1 10*3/uL (ref 0.0–0.5)
EOS PCT: 3 %
HCT: 38.4 % (ref 34.8–46.6)
Hemoglobin: 13.1 g/dL (ref 11.6–15.9)
Lymphocytes Relative: 10 %
Lymphs Abs: 0.4 10*3/uL — ABNORMAL LOW (ref 0.9–3.3)
MCH: 32.4 pg (ref 25.1–34.0)
MCHC: 34.2 g/dL (ref 31.5–36.0)
MCV: 94.7 fL (ref 79.5–101.0)
MONO ABS: 0.3 10*3/uL (ref 0.1–0.9)
MONOS PCT: 7 %
Neutro Abs: 3.3 10*3/uL (ref 1.5–6.5)
Neutrophils Relative %: 79 %
Platelet Count: 146 10*3/uL (ref 145–400)
RBC: 4.05 MIL/uL (ref 3.70–5.45)
RDW: 14.2 % (ref 11.2–14.5)
WBC Count: 4.1 10*3/uL (ref 3.9–10.3)

## 2018-03-31 MED ORDER — ACETAMINOPHEN 325 MG PO TABS
650.0000 mg | ORAL_TABLET | Freq: Once | ORAL | Status: AC
  Administered 2018-03-31: 650 mg via ORAL

## 2018-03-31 MED ORDER — ACETAMINOPHEN 325 MG PO TABS
ORAL_TABLET | ORAL | Status: AC
  Filled 2018-03-31: qty 1

## 2018-03-31 MED ORDER — DIPHENHYDRAMINE HCL 25 MG PO CAPS
ORAL_CAPSULE | ORAL | Status: AC
  Filled 2018-03-31: qty 2

## 2018-03-31 MED ORDER — SODIUM CHLORIDE 0.9 % IV SOLN
Freq: Once | INTRAVENOUS | Status: AC
  Administered 2018-03-31: 12:00:00 via INTRAVENOUS
  Filled 2018-03-31: qty 250

## 2018-03-31 MED ORDER — TRASTUZUMAB CHEMO 150 MG IV SOLR
450.0000 mg | Freq: Once | INTRAVENOUS | Status: AC
  Administered 2018-03-31: 450 mg via INTRAVENOUS
  Filled 2018-03-31: qty 21.43

## 2018-03-31 MED ORDER — HEPARIN SOD (PORK) LOCK FLUSH 100 UNIT/ML IV SOLN
500.0000 [IU] | Freq: Once | INTRAVENOUS | Status: AC | PRN
  Administered 2018-03-31: 500 [IU]
  Filled 2018-03-31: qty 5

## 2018-03-31 MED ORDER — SODIUM CHLORIDE 0.9% FLUSH
10.0000 mL | INTRAVENOUS | Status: DC | PRN
Start: 2018-03-31 — End: 2018-03-31
  Administered 2018-03-31: 10 mL
  Filled 2018-03-31: qty 10

## 2018-03-31 MED ORDER — SODIUM CHLORIDE 0.9% FLUSH
10.0000 mL | INTRAVENOUS | Status: DC | PRN
  Administered 2018-03-31: 10 mL
  Filled 2018-03-31: qty 10

## 2018-03-31 MED ORDER — DIPHENHYDRAMINE HCL 25 MG PO CAPS
50.0000 mg | ORAL_CAPSULE | Freq: Once | ORAL | Status: AC
  Administered 2018-03-31: 50 mg via ORAL

## 2018-03-31 MED ORDER — ACETAMINOPHEN 325 MG PO TABS
ORAL_TABLET | ORAL | Status: AC
  Filled 2018-03-31: qty 2

## 2018-03-31 NOTE — Patient Instructions (Signed)
[facility] Discharge Instructions for Patients Receiving Chemotherapy  Today you received the following chemotherapy agents Herceptin  To help prevent nausea and vomiting after your treatment, we encourage you to take your nausea medication as directed   If you develop nausea and vomiting that is not controlled by your nausea medication, call the clinic.   BELOW ARE SYMPTOMS THAT SHOULD BE REPORTED IMMEDIATELY:  *FEVER GREATER THAN 100.5 F  *CHILLS WITH OR WITHOUT FEVER  NAUSEA AND VOMITING THAT IS NOT CONTROLLED WITH YOUR NAUSEA MEDICATION  *UNUSUAL SHORTNESS OF BREATH  *UNUSUAL BRUISING OR BLEEDING  TENDERNESS IN MOUTH AND THROAT WITH OR WITHOUT PRESENCE OF ULCERS  *URINARY PROBLEMS  *BOWEL PROBLEMS  UNUSUAL RASH Items with * indicate a potential emergency and should be followed up as soon as possible.  Feel free to call the clinic should you have any questions or concerns. The clinic phone number is [phone].  Please show the CHEMO ALERT CARD at check-in to the Emergency Department and triage nurse.   

## 2018-03-31 NOTE — Assessment & Plan Note (Signed)
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 Adjuvant Herceptinstarted 10/14/17 2. Adjuvant radiation therapy started 10/23/17-11/25/17 3. adjuvant antiestrogen therapystarted February 2019 --------------------------------------------------  Herceptin Toxicities: Tolerating it well Monitoring closely for cardiac ejection fraction 03/02/2018 EF 45% follows with Charlene Rubio. She will need echocardiogram in September.  If her EF further falls then we have to hold Herceptin  Anti estrogen therapy with Charlene Rubio is tolerating it extremely well. Denies any hot flashes or arthralgias or myalgias.  Return to clinic every 3 weeks for Herceptin every 6 weeks for follow-up with me.

## 2018-03-31 NOTE — Progress Notes (Signed)
Patient Care Team: [name], DO as PCP - General (Internal Medicine)  DIAGNOSIS:  Encounter Diagnosis  Name Primary?  . 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, estrogen receptor positive ([place])     SUMMARY OF ONCOLOGIC HISTORY:   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, estrogen receptor positive ([place])   06/23/2017 Initial Diagnosis    Left breast 11 o'clock position 6.3 x 4.2 x 6.1 cm mass with thick papillary projections, 2 adjacent left axillary lymph nodes; right breast 930 position 1.7 cm mass; left breast and left lymph node biopsy revealed IDC grade 3, ER 5%, PR 0%, Ki-67 50%, HER-2 positive ratio 2.04 T3N1 stage IIIa; right breast: Grade 1 IDC  ER 95%, PR 95%, Ki-67 15%, HER-2 negative; T1CN0 stage I a    08/27/2017 Surgery    Right simple mastectomy: IDC grade 1, 1.1 cm, DCIS low-grade, margins negative, 0/2 lymph nodes negative; ER 95%, PR 95%, HER-2 negative Ki-67 15% T1 cN0 stage I a left simple mastectomy: IDC grade 3, 6.2 cm, 4/13 lymph nodes positive, margins negative, ER 5 %, PR 0%, HER-2 positive ratio 2.04, Ki-67 50 % T3 N2 stage IIIa     10/14/2017 -  Chemotherapy    Adjuvant Herceptin alone for 1 year     10/23/2017 - 11/25/2017 Radiation Therapy    Adjuvant radiation therapy    12/05/2017 Genetic Testing    The Common Hereditary Cancer Panel offered by [facility] includes sequencing and/or deletion duplication testing of the following 47 genes: APC, ATM, AXIN2, BARD1, BMPR1A, BRCA1, BRCA2, BRIP1, CDH1, CDKN2A (p14ARF), CDKN2A (p16INK4a), CKD4, CHEK2, CTNNA1, DICER1, EPCAM (Deletion/duplication testing only), GREM1 (promoter region deletion/duplication testing only), KIT, MEN1, MLH1, MSH2, MSH3, MSH6, MUTYH, NBN, NF1, NHTL1, PALB2, PDGFRA, PMS2, POLD1, POLE, PTEN, RAD50, RAD51C, RAD51D, SDHB, SDHC, SDHD, SMAD4, SMARCA4. STK11, TP53, TSC1, TSC2, and VHL.  The following genes were evaluated for sequence changes only: SDHA  and HOXB13 c.251G>A variant only.  Results: Negative, no pathogenic variants identified.  The date of this test report is 12/05/2017.      CHIEF COMPLIANT: Follow-up on anastrozole and Herceptin  INTERVAL HISTORY: Charlene Rubio is a 72-year with above-mentioned history of bilateral mastectomies is currently on adjuvant Herceptin.  She had a ejection fraction of 45% and Dr. Aundra Dubin is watching it.  She is also taking anastrozole and tolerating it very well.  She had a blood clot in the left leg and has been on Eliquis for the past 6 months.  To have an appointment to see her primary care physician to recheck an ultrasound and see if she can come off Eliquis.  REVIEW OF SYSTEMS:   Constitutional: Denies fevers, chills or abnormal weight loss Eyes: Denies blurriness of vision Ears, nose, mouth, throat, and face: Denies mucositis or sore throat Respiratory: Denies cough, dyspnea or wheezes Cardiovascular: Denies palpitation, chest discomfort Gastrointestinal:  Denies nausea, heartburn or change in bowel habits Skin: Denies abnormal skin rashes Lymphatics: Denies new lymphadenopathy or easy bruising Neurological:Denies numbness, tingling or new weaknesses Behavioral/Psych: Mood is stable, no new changes  Extremities: No lower extremity edema   All other systems were reviewed with the patient and are negative.  I have reviewed the past medical history, past surgical history, social history and family history with the patient and they are unchanged from previous note.  ALLERGIES:  has No Known Allergies.  MEDICATIONS:  Current Outpatient Medications  Medication Sig Dispense Refill  . carvedilol (COREG)  12.5 MG tablet Take 1 tablet (12.5 mg total) by mouth 2 (two) times daily with a meal. 60 tablet 3  . donepezil (ARICEPT) 10 MG tablet Take 1 tablet (10 mg total) by mouth at bedtime. 90 tablet 1  . ELIQUIS 5 MG TABS tablet TAKE 1 TABLET BY MOUTH TWICE A DAY 60 tablet 4  . furosemide (LASIX)  80 MG tablet Take 1 tablet (80 mg total) by mouth daily. 30 tablet 3  . letrozole (FEMARA) 2.5 MG tablet Take 1 tablet (2.5 mg total) by mouth daily. 90 tablet 3  . metFORMIN (GLUCOPHAGE-XR) 500 MG 24 hr tablet TAKE 1 TABLET (500 MG TOTAL) BY MOUTH 2 (TWO) TIMES DAILY WITH A MEAL. 180 tablet 1  . Multiple Vitamins-Minerals (ADULT GUMMY PO) Take 1 tablet by mouth 3 (three) times a week.    . potassium chloride SA (K-DUR,KLOR-CON) 20 MEQ tablet Take 1 tablet (20 mEq total) by mouth daily. 30 tablet 3  . sacubitril-valsartan (ENTRESTO) 24-26 MG Take 1 tablet by mouth 2 (two) times daily. 60 tablet 3   No current facility-administered medications for this visit.     PHYSICAL EXAMINATION: ECOG PERFORMANCE STATUS: 1 - Symptomatic but completely ambulatory  Vitals:   03/31/18 1054  BP: (!) 149/50  Pulse: 61  Resp: 18  Temp: 98.5 F (36.9 C)  SpO2: 99%   Filed Weights   03/31/18 1054  Weight: 161 lb 6.4 oz (73.2 kg)    GENERAL:alert, no distress and comfortable SKIN: skin color, texture, turgor are normal, no rashes or significant lesions EYES: normal, Conjunctiva are pink and non-injected, sclera clear OROPHARYNX:no exudate, no erythema and lips, buccal mucosa, and tongue normal  NECK: supple, thyroid normal size, non-tender, without nodularity LYMPH:  no palpable lymphadenopathy in the cervical, axillary or inguinal LUNGS: clear to auscultation and percussion with normal breathing effort HEART: regular rate & rhythm and no murmurs and no lower extremity edema ABDOMEN:abdomen soft, non-tender and normal bowel sounds MUSCULOSKELETAL:no cyanosis of digits and no clubbing  NEURO: alert & oriented x 3 with fluent speech, no focal motor/sensory deficits EXTREMITIES: No lower extremity edema   LABORATORY DATA:  I have reviewed the data as listed CMP Latest Ref Rng & Units 03/31/2018 03/13/2018 02/17/2018  Glucose 70 - 99 mg/dL 179(H) 142(H) 172(H)  BUN 8 - 23 mg/dL 18 15 21  Creatinine  0.44 - 1.00 mg/dL 0.78 0.65 0.81  Sodium 135 - 145 mmol/L 143 142 143  Potassium 3.5 - 5.1 mmol/L 4.3 3.4(L) 3.4(L)  Chloride 98 - 111 mmol/L 107 107 106  CO2 22 - 32 mmol/L 23 24 27  Calcium 8.9 - 10.3 mg/dL 9.1 9.0 9.5  Total Protein 6.5 - 8.1 g/dL 6.5 - 6.4(L)  Total Bilirubin 0.3 - 1.2 mg/dL 0.6 - 0.5  Alkaline Phos 38 - 126 U/L 77 - 77  AST 15 - 41 U/L 11(L) - 11(L)  ALT 0 - 44 U/L 10 - 7    Lab Results  Component Value Date   WBC 4.1 03/31/2018   HGB 13.1 03/31/2018   HCT 38.4 03/31/2018   MCV 94.7 03/31/2018   PLT 146 03/31/2018   NEUTROABS 3.3 03/31/2018    ASSESSMENT & PLAN:  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, estrogen receptor positive (HCC) Right simple mastectomy: IDC grade 1, 1.1 cm, DCIS low-grade, margins negative, 0/2 lymph nodes negative; ER 95%, PR 95%, HER-2 negative Ki-67 15%T1 cN0 stage I a left simple mastectomy: IDC grade 3,   6.2 cm, 4/13 lymph nodes positive, margins negative, ER 5 %, PR 0%, HER-2 positive ratio 2.04, Ki-67 50 % T3 N2 stage IIIa Echocardiogram 09/24/2017: EF 50%  Recommendation: 1. Adjuvant Herceptinstarted 10/14/17 2. Adjuvant radiation therapy started 10/23/17-11/25/17 3. adjuvant antiestrogen therapystarted February 2019 --------------------------------------------------  Herceptin Toxicities: Tolerating it well Monitoring closely for cardiac ejection fraction 03/02/2018 EF 45% follows with Dr. McClean. She will need echocardiogram in September.  If her EF further falls then we have to hold Herceptin  Anti estrogen therapy with Letrozoleshe is tolerating it extremely well. Denies any hot flashes or arthralgias or myalgias.  Return to clinic every 3 weeks for Herceptin every 6 weeks for follow-up with me.     No orders of the defined types were placed in this encounter.  The patient has a good understanding of the overall plan. she agrees with it. she will call with any problems that may develop  before the next visit here.   Viinay K , MD 03/31/18    

## 2018-04-01 ENCOUNTER — Ambulatory Visit (HOSPITAL_COMMUNITY)
Admission: RE | Admit: 2018-04-01 | Discharge: 2018-04-01 | Disposition: A | Discharge status: Home / Self Care | Payer: Medicare Other | Source: Ambulatory Visit | Attending: Cardiology | Admitting: Cardiology

## 2018-04-01 DIAGNOSIS — I825Y2 Chronic embolism and thrombosis of unspecified deep veins of left proximal lower extremity: Secondary | ICD-10-CM | POA: Diagnosis not present

## 2018-04-02 ENCOUNTER — Telehealth: Payer: Self-pay | Admitting: Cardiology

## 2018-04-02 NOTE — Telephone Encounter (Signed)
Called patients cell number and LVM to call me regarding scheduling her cardiac MRI.

## 2018-04-02 NOTE — Telephone Encounter (Signed)
Called patients home number and LVM to call me back regarding her cardiac MRI.

## 2018-04-07 ENCOUNTER — Other Ambulatory Visit: Payer: Self-pay | Admitting: Neurology

## 2018-04-07 DIAGNOSIS — R413 Other amnesia: Secondary | ICD-10-CM

## 2018-04-08 ENCOUNTER — Encounter: Payer: Self-pay | Admitting: Internal Medicine

## 2018-04-15 ENCOUNTER — Other Ambulatory Visit (HOSPITAL_COMMUNITY): Payer: Self-pay | Admitting: Cardiology

## 2018-04-15 ENCOUNTER — Other Ambulatory Visit: Payer: Self-pay

## 2018-04-15 DIAGNOSIS — Z1159 Encounter for screening for other viral diseases: Secondary | ICD-10-CM

## 2018-04-15 DIAGNOSIS — E118 Type 2 diabetes mellitus with unspecified complications: Secondary | ICD-10-CM

## 2018-04-15 DIAGNOSIS — Z9189 Other specified personal risk factors, not elsewhere classified: Secondary | ICD-10-CM

## 2018-04-21 ENCOUNTER — Inpatient Hospital Stay: Payer: Medicare Other | Attending: Hematology and Oncology

## 2018-04-21 VITALS — BP 118/54 | HR 60 | Temp 97.9°F | Resp 18

## 2018-04-21 DIAGNOSIS — Z5112 Encounter for antineoplastic immunotherapy: Secondary | ICD-10-CM | POA: Diagnosis not present

## 2018-04-21 DIAGNOSIS — C50811 Malignant neoplasm of overlapping sites of right female breast: Secondary | ICD-10-CM | POA: Insufficient documentation

## 2018-04-21 DIAGNOSIS — Z9013 Acquired absence of bilateral breasts and nipples: Secondary | ICD-10-CM | POA: Diagnosis not present

## 2018-04-21 DIAGNOSIS — Z170 Estrogen receptor positive status [ER+]: Secondary | ICD-10-CM | POA: Diagnosis not present

## 2018-04-21 DIAGNOSIS — C773 Secondary and unspecified malignant neoplasm of axilla and upper limb lymph nodes: Secondary | ICD-10-CM | POA: Insufficient documentation

## 2018-04-21 DIAGNOSIS — Z79899 Other long term (current) drug therapy: Secondary | ICD-10-CM | POA: Diagnosis not present

## 2018-04-21 DIAGNOSIS — C50812 Malignant neoplasm of overlapping sites of left female breast: Secondary | ICD-10-CM | POA: Diagnosis not present

## 2018-04-21 MED ORDER — SODIUM CHLORIDE 0.9 % IV SOLN
Freq: Once | INTRAVENOUS | Status: AC
  Administered 2018-04-21: 11:00:00 via INTRAVENOUS
  Filled 2018-04-21: qty 250

## 2018-04-21 MED ORDER — SODIUM CHLORIDE 0.9% FLUSH
10.0000 mL | INTRAVENOUS | Status: DC | PRN
  Administered 2018-04-21: 10 mL
  Filled 2018-04-21: qty 10

## 2018-04-21 MED ORDER — TRASTUZUMAB CHEMO 150 MG IV SOLR
450.0000 mg | Freq: Once | INTRAVENOUS | Status: AC
  Administered 2018-04-21: 450 mg via INTRAVENOUS
  Filled 2018-04-21: qty 21.43

## 2018-04-21 MED ORDER — ACETAMINOPHEN 325 MG PO TABS
650.0000 mg | ORAL_TABLET | Freq: Once | ORAL | Status: AC
  Administered 2018-04-21: 650 mg via ORAL

## 2018-04-21 MED ORDER — ACETAMINOPHEN 325 MG PO TABS
ORAL_TABLET | ORAL | Status: AC
  Filled 2018-04-21: qty 2

## 2018-04-21 MED ORDER — HEPARIN SOD (PORK) LOCK FLUSH 100 UNIT/ML IV SOLN
500.0000 [IU] | Freq: Once | INTRAVENOUS | Status: AC | PRN
  Administered 2018-04-21: 500 [IU]
  Filled 2018-04-21: qty 5

## 2018-04-21 MED ORDER — DIPHENHYDRAMINE HCL 25 MG PO CAPS
ORAL_CAPSULE | ORAL | Status: AC
  Filled 2018-04-21: qty 2

## 2018-04-21 MED ORDER — DIPHENHYDRAMINE HCL 25 MG PO CAPS
50.0000 mg | ORAL_CAPSULE | Freq: Once | ORAL | Status: AC
  Administered 2018-04-21: 50 mg via ORAL

## 2018-04-21 NOTE — Patient Instructions (Signed)
[facility] Discharge Instructions for Patients Receiving Chemotherapy  Today you received the following chemotherapy agents Herceptin  To help prevent nausea and vomiting after your treatment, we encourage you to take your nausea medication as directed   If you develop nausea and vomiting that is not controlled by your nausea medication, call the clinic.   BELOW ARE SYMPTOMS THAT SHOULD BE REPORTED IMMEDIATELY:  *FEVER GREATER THAN 100.5 F  *CHILLS WITH OR WITHOUT FEVER  NAUSEA AND VOMITING THAT IS NOT CONTROLLED WITH YOUR NAUSEA MEDICATION  *UNUSUAL SHORTNESS OF BREATH  *UNUSUAL BRUISING OR BLEEDING  TENDERNESS IN MOUTH AND THROAT WITH OR WITHOUT PRESENCE OF ULCERS  *URINARY PROBLEMS  *BOWEL PROBLEMS  UNUSUAL RASH Items with * indicate a potential emergency and should be followed up as soon as possible.  Feel free to call the clinic should you have any questions or concerns. The clinic phone number is [phone].  Please show the CHEMO ALERT CARD at check-in to the Emergency Department and triage nurse.   

## 2018-04-21 NOTE — Progress Notes (Signed)
Per [name], no labs needed during this visit (last labs drawn 03/31/18). Ok to proceed with treatment.

## 2018-05-08 ENCOUNTER — Ambulatory Visit (HOSPITAL_BASED_OUTPATIENT_CLINIC_OR_DEPARTMENT_OTHER)
Admission: RE | Admit: 2018-05-08 | Discharge: 2018-05-08 | Disposition: A | Discharge status: Home / Self Care | Payer: Medicare Other | Source: Ambulatory Visit | Attending: Cardiology | Admitting: Cardiology

## 2018-05-08 ENCOUNTER — Telehealth (HOSPITAL_COMMUNITY): Payer: Self-pay

## 2018-05-08 ENCOUNTER — Encounter (HOSPITAL_COMMUNITY): Payer: Self-pay | Admitting: *Deleted

## 2018-05-08 ENCOUNTER — Ambulatory Visit (HOSPITAL_COMMUNITY)
Admission: RE | Admit: 2018-05-08 | Discharge: 2018-05-08 | Disposition: A | Discharge status: Home / Self Care | Payer: Medicare Other | Source: Ambulatory Visit | Attending: Cardiology | Admitting: Cardiology

## 2018-05-08 VITALS — BP 120/66 | HR 65 | Wt 163.2 lb

## 2018-05-08 DIAGNOSIS — I5022 Chronic systolic (congestive) heart failure: Secondary | ICD-10-CM | POA: Insufficient documentation

## 2018-05-08 DIAGNOSIS — I872 Venous insufficiency (chronic) (peripheral): Secondary | ICD-10-CM | POA: Diagnosis not present

## 2018-05-08 DIAGNOSIS — I429 Cardiomyopathy, unspecified: Secondary | ICD-10-CM

## 2018-05-08 DIAGNOSIS — I110 Hypertensive heart disease with heart failure: Secondary | ICD-10-CM | POA: Diagnosis not present

## 2018-05-08 DIAGNOSIS — F0390 Unspecified dementia without behavioral disturbance: Secondary | ICD-10-CM | POA: Diagnosis not present

## 2018-05-08 DIAGNOSIS — Z170 Estrogen receptor positive status [ER+]: Secondary | ICD-10-CM

## 2018-05-08 DIAGNOSIS — Z8249 Family history of ischemic heart disease and other diseases of the circulatory system: Secondary | ICD-10-CM | POA: Diagnosis not present

## 2018-05-08 DIAGNOSIS — I82502 Chronic embolism and thrombosis of unspecified deep veins of left lower extremity: Secondary | ICD-10-CM | POA: Diagnosis not present

## 2018-05-08 DIAGNOSIS — C50212 Malignant neoplasm of upper-inner quadrant of left female breast: Secondary | ICD-10-CM

## 2018-05-08 DIAGNOSIS — Z7901 Long term (current) use of anticoagulants: Secondary | ICD-10-CM | POA: Insufficient documentation

## 2018-05-08 DIAGNOSIS — Z79899 Other long term (current) drug therapy: Secondary | ICD-10-CM | POA: Diagnosis not present

## 2018-05-08 DIAGNOSIS — Z9013 Acquired absence of bilateral breasts and nipples: Secondary | ICD-10-CM | POA: Diagnosis not present

## 2018-05-08 DIAGNOSIS — I825Y2 Chronic embolism and thrombosis of unspecified deep veins of left proximal lower extremity: Secondary | ICD-10-CM

## 2018-05-08 DIAGNOSIS — E119 Type 2 diabetes mellitus without complications: Secondary | ICD-10-CM | POA: Diagnosis not present

## 2018-05-08 DIAGNOSIS — Z7984 Long term (current) use of oral hypoglycemic drugs: Secondary | ICD-10-CM | POA: Diagnosis not present

## 2018-05-08 DIAGNOSIS — I351 Nonrheumatic aortic (valve) insufficiency: Secondary | ICD-10-CM | POA: Diagnosis not present

## 2018-05-08 NOTE — Progress Notes (Signed)
  Echocardiogram 2D Echocardiogram has been performed.  Chella Chapdelaine L Androw 05/08/2018, 12:06 PM

## 2018-05-08 NOTE — Progress Notes (Signed)
At appt today pt and husband expressed that she is in the donut hole and it is difficult to pay for medications at this time.  Completed [facility] form, pt and Dr Aundra Dubin signed and form was faxed in.

## 2018-05-08 NOTE — Telephone Encounter (Signed)
Medication Samples have been provided to the patient.  Drug name: Eliquis       Strength: 5 mg        Qty: 4  LOT: [id number]  Exp.Date: 11/21   Dosing instructions: TAKE 1 TABLET BY MOUTH TWICE A DAY  The patient has been instructed regarding the correct time, dose, and frequency of taking this medication, including desired effects and most common side effects.   Vanice Sarah 12:44 PM 05/08/2018    Medication Samples have been provided to the patient.  Drug name: Delene Loll       Strength: 24/26 mg        Qty: 1  LOT: [id number]  Exp.Date: 03/22  Dosing instructions: Take 1 tablet by mouth 2 (two) times daily  The patient has been instructed regarding the correct time, dose, and frequency of taking this medication, including desired effects and most common side effects.   Vanice Sarah 12:46 PM 05/08/2018

## 2018-05-08 NOTE — Patient Instructions (Signed)
Charlene Rubio will call you to schedule the cardiac MRI  Your physician recommends that you schedule a follow-up appointment in: 3 months

## 2018-05-10 NOTE — Progress Notes (Signed)
PCP: [name] Oncology: [name] Cardiology: Dr. Claiborne Billings HF Cardiology: Dr. Aundra Dubin  72 yo with history of breast cancer, chronic systolic CHF, chronic left leg DVT, and dementia was referred by [name] for cardio-oncology evaluation given Herceptin use.   Patient went a number of years without accessing medical care.  However, in 9/18, she was admitted with dyspnea and was found to have a CHF exacerbation.  Echo showed EF 25-30%.  She was diuresed.  Cardiolite in 10/18 showed EF 41% with no ischemia.  Blood glucose was also noted to be very high this admission.  She was then diagnosed with breast cancer and had bilateral mastectomies in 1/19.  ER+/PR+/HER2+. Planned for Herceptin starting 2/19 to continue until 11/19.  Radiation 3/19-4/19.   She had an echo in 2/19 suggesting improved EF, 50-55%.  However, repeat echo in 5/19 showed EF 45% with mild AS and moderate AI, normal RV size and systolic function.  Echo today was reviewed, EF 50% with mild LVH, normal RV size and systolic function, mild AS with mild-moderate aortic insufficiency.   Most of history comes from her husband.  She is interactive but has a poor memory. Very little activity at baseline per husband. She has chronic problems with her legs and walks little. She denies dyspnea or chest pain.  She finishes Herceptin 06/22/18. She has chronic venous insufficiency.   Labs (5/19): K 3.4, creatinine 0.74, hgb 12.4 Labs (7/19): K 3.4, creatinine 0.81, LDL 79 Labs (8/19): K 4.3, creatinine 0.78  PMH: 1. Breast cancer: Bilateral mastectomies in 1/19.  ER+/PR+/HER2+. Planned for Herceptin starting 2/19 to continue for a year.  Radiation 3/19-4/19.  2. Type II diabetes 3. Chronic DVT left leg: Found in 9/18.  4. Dementia 5. HTN 6. Chronic systolic CHF: ?Etiology.  - Echo (9/18): EF 25-30% - Cardiolite (10/18): EF 41%, no ischemia.  - Echo (2/19): EF 50-55%, mild LV dilation, mild LVH, mild AS, mild-moderate AI.  - Echo (5/19):  EF 45%, diffuse hypokinesis, normal RV size and systolic function, mild AS with moderate AI.  - Echo (7/19): EF 45%, mild LV dilation, diffuse hypokinesis, normal RV, moderate AI/mild AS.  - Echo (9/19): EF 50%, mild LVH, diffuse hypokinesis, mild AS and mild-moderate aortic insufficiency.   FH: Father with CHF, dementia.  Brother with CAD s/p PCI.   Social History   Socioeconomic History  . Marital status: Married    Spouse name: Not on file  . Number of children: Not on file  . Years of education: Not on file  . Highest education level: Not on file  Occupational History  . Occupation: retired Engineer, structural  . Financial resource strain: Not hard at all  . Food insecurity:    Worry: Never true    Inability: Never true  . Transportation needs:    Medical: No    Non-medical: No  Tobacco Use  . Smoking status: Never Smoker  . Smokeless tobacco: Never Used  Substance and Sexual Activity  . Alcohol use: No  . Drug use: No  . Sexual activity: Not Currently  Lifestyle  . Physical activity:    Days per week: 0 days    Minutes per session: 0 min  . Stress: Only a little  Relationships  . Social connections:    Talks on phone: More than three times a week    Gets together: More than three times a week    Attends religious service: Never    Active member of  club or organization: No    Attends meetings of clubs or organizations: Never    Relationship status: Married  . Intimate partner violence:    Fear of current or ex partner: No    Emotionally abused: No    Physically abused: No    Forced sexual activity: No  Other Topics Concern  . Not on file  Social History Narrative   Social History   Diet?    Do you drink/eat things with caffeine? Yes (minimal) coke   Marital status?          married                          What year were you married? 1968   Do you live in a house, apartment, assisted living, condo, trailer, etc.? house   Is it one or more stories? one    How many persons live in your home? 2   Do you have any pets in your home? (please list) none   Highest level of education completed?   Current or past profession: preschool teacher/nanny   Do you exercise?         0                             Type & how often?   Advanced Directives   Do you have a living will?   Do you have a DNR form?                                  If not, do you want to discuss one?   Do you have signed POA/HPOA for forms?    Functional Status   Do you have difficulty bathing or dressing yourself?   Do you have difficulty preparing food or eating?    Do you have difficulty managing your medications?   Do you have difficulty managing your finances?   Do you have difficulty affording your medications?    Admitted to [facility] 05/01/17   Never smoked   Alcohol none   DNR      ROS: All systems reviewed and negative except as per HPI.  Current Outpatient Medications  Medication Sig Dispense Refill  . carvedilol (COREG) 12.5 MG tablet Take 1 tablet (12.5 mg total) by mouth 2 (two) times daily with a meal. 60 tablet 3  . donepezil (ARICEPT) 10 MG tablet Take 1 tablet (10 mg total) by mouth at bedtime. 90 tablet 1  . ELIQUIS 5 MG TABS tablet TAKE 1 TABLET BY MOUTH TWICE A DAY 60 tablet 4  . furosemide (LASIX) 80 MG tablet TAKE 1 TABLET BY MOUTH EVERY DAY 90 tablet 1  . letrozole (FEMARA) 2.5 MG tablet Take 1 tablet (2.5 mg total) by mouth daily. 90 tablet 3  . metFORMIN (GLUCOPHAGE-XR) 500 MG 24 hr tablet TAKE 1 TABLET (500 MG TOTAL) BY MOUTH 2 (TWO) TIMES DAILY WITH A MEAL. 180 tablet 1  . Multiple Vitamins-Minerals (ADULT GUMMY PO) Take 1 tablet by mouth 3 (three) times a week.    . potassium chloride SA (K-DUR,KLOR-CON) 20 MEQ tablet Take 1 tablet (20 mEq total) by mouth daily. 30 tablet 3  . sacubitril-valsartan (ENTRESTO) 24-26 MG Take 1 tablet by mouth 2 (two) times daily. 60 tablet 3   No current facility-administered medications for this  encounter.    BP 120/66   Pulse 65   Wt 74 kg (163 lb 3.2 oz)   LMP  (LMP Unknown)   SpO2 98%   BMI 26.34 kg/m  General: NAD Neck: No JVD, no thyromegaly or thyroid nodule.  Lungs: Clear to auscultation bilaterally with normal respiratory effort. CV: Nondisplaced PMI.  Heart regular S1/S2, no S3/S4, 2/6 SEM RUSB.  Left lower leg chronic swelling (since DVT), 1+ right ankle edema.  No carotid bruit.  Normal pedal pulses.  Abdomen: Soft, nontender, no hepatosplenomegaly, no distention.  Skin: Intact without lesions or rashes.  Neurologic: Alert, poor memory.  Psych: Normal affect. Extremities: No clubbing or cyanosis.  HEENT: Normal.   Assessment/Plan: 1. Chronic left leg DVT: She is on Eliquis.  With cancer diagnosis, will continue chronically.  She has venous insufficiency in the left leg likely related to prior DVT.  2. Chronic systolic CHF: Cardiomyopathy of uncertain etiology. First diagnosed in 9/18 with CHF exacerbation, EF by echo at the time was 25-30%.  Cardiolite in 10/18 showed no ischemia, EF 41%.  Echo in 2/19 was read as showing improved EF, 50-55%.  Echo in 5/19 showed EF about 45%.  Echo in 7/19 showed EF 45% with diffuse hypokinesis.  Today's echo shows EF 50%, some improvement.  Weight is down and she does not appear volume overloaded today. Probably NYHA class II symptoms.  She will be completing Herceptin 11/4.  - I will try again to arrange for cardiac MRI to assess for infiltrative disease.  - Continue Lasix 80 mg daily.    - Continue Coreg 12.5 mg bid and Entresto 24/26 bid.  - She will complete Herceptin soon on 11/4.  I will not make her repeat an echo but will do cardiac MRI as above.  3. Aortic valve disorder: Echo 9/19 with mild AS, mild-moderate AI.  Follow for now.   Followup in 3 months.    Loralie Champagne 05/10/2018

## 2018-05-12 ENCOUNTER — Inpatient Hospital Stay: Payer: Medicare Other

## 2018-05-12 ENCOUNTER — Ambulatory Visit: Payer: Medicare Other

## 2018-05-12 ENCOUNTER — Ambulatory Visit: Payer: Medicare Other | Admitting: Hematology and Oncology

## 2018-05-12 ENCOUNTER — Other Ambulatory Visit: Payer: Medicare Other

## 2018-05-12 ENCOUNTER — Inpatient Hospital Stay: Payer: Medicare Other | Admitting: Hematology and Oncology

## 2018-05-12 DIAGNOSIS — C773 Secondary and unspecified malignant neoplasm of axilla and upper limb lymph nodes: Secondary | ICD-10-CM

## 2018-05-12 DIAGNOSIS — C50812 Malignant neoplasm of overlapping sites of left female breast: Secondary | ICD-10-CM

## 2018-05-12 DIAGNOSIS — Z170 Estrogen receptor positive status [ER+]: Secondary | ICD-10-CM

## 2018-05-12 DIAGNOSIS — Z9013 Acquired absence of bilateral breasts and nipples: Secondary | ICD-10-CM | POA: Diagnosis not present

## 2018-05-12 DIAGNOSIS — Z5112 Encounter for antineoplastic immunotherapy: Secondary | ICD-10-CM | POA: Diagnosis not present

## 2018-05-12 DIAGNOSIS — C50811 Malignant neoplasm of overlapping sites of right female breast: Secondary | ICD-10-CM

## 2018-05-12 DIAGNOSIS — Z79899 Other long term (current) drug therapy: Secondary | ICD-10-CM | POA: Diagnosis not present

## 2018-05-12 DIAGNOSIS — Z95828 Presence of other vascular implants and grafts: Secondary | ICD-10-CM

## 2018-05-12 LAB — CMP (CANCER CENTER ONLY)
ALT: 8 U/L (ref 0–44)
AST: 10 U/L — AB (ref 15–41)
Albumin: 3.5 g/dL (ref 3.5–5.0)
Alkaline Phosphatase: 83 U/L (ref 38–126)
Anion gap: 11 (ref 5–15)
BUN: 17 mg/dL (ref 8–23)
CHLORIDE: 107 mmol/L (ref 98–111)
CO2: 26 mmol/L (ref 22–32)
Calcium: 9.1 mg/dL (ref 8.9–10.3)
Creatinine: 0.77 mg/dL (ref 0.44–1.00)
GFR, Estimated: >60 mL/min (ref >60)
Glucose, Bld: 208 mg/dL — ABNORMAL HIGH (ref 70–99)
POTASSIUM: 3.4 mmol/L — AB (ref 3.5–5.1)
SODIUM: 144 mmol/L (ref 135–145)
Total Bilirubin: 0.5 mg/dL (ref 0.3–1.2)
Total Protein: 6.4 g/dL — ABNORMAL LOW (ref 6.5–8.1)

## 2018-05-12 LAB — CBC WITH DIFFERENTIAL (CANCER CENTER ONLY)
Basophils Absolute: 0 10*3/uL (ref 0.0–0.1)
Basophils Relative: 1 %
Eosinophils Absolute: 0.1 10*3/uL (ref 0.0–0.5)
Eosinophils Relative: 3 %
HCT: 36.5 % (ref 34.8–46.6)
HEMOGLOBIN: 12.3 g/dL (ref 11.6–15.9)
LYMPHS ABS: 0.4 10*3/uL — AB (ref 0.9–3.3)
LYMPHS PCT: 7 %
MCH: 32.2 pg (ref 25.1–34.0)
MCHC: 33.9 g/dL (ref 31.5–36.0)
MCV: 95 fL (ref 79.5–101.0)
MONOS PCT: 6 %
Monocytes Absolute: 0.3 10*3/uL (ref 0.1–0.9)
NEUTROS PCT: 83 %
Neutro Abs: 4.6 10*3/uL (ref 1.5–6.5)
Platelet Count: 158 10*3/uL (ref 145–400)
RBC: 3.84 MIL/uL (ref 3.70–5.45)
RDW: 14.2 % (ref 11.2–14.5)
WBC: 5.5 10*3/uL (ref 3.9–10.3)

## 2018-05-12 MED ORDER — DIPHENHYDRAMINE HCL 25 MG PO CAPS
50.0000 mg | ORAL_CAPSULE | Freq: Once | ORAL | Status: AC
  Administered 2018-05-12: 50 mg via ORAL

## 2018-05-12 MED ORDER — TRASTUZUMAB CHEMO 150 MG IV SOLR
450.0000 mg | Freq: Once | INTRAVENOUS | Status: AC
  Administered 2018-05-12: 450 mg via INTRAVENOUS
  Filled 2018-05-12: qty 21.43

## 2018-05-12 MED ORDER — HEPARIN SOD (PORK) LOCK FLUSH 100 UNIT/ML IV SOLN
500.0000 [IU] | Freq: Once | INTRAVENOUS | Status: AC | PRN
  Administered 2018-05-12: 500 [IU]
  Filled 2018-05-12: qty 5

## 2018-05-12 MED ORDER — ACETAMINOPHEN 325 MG PO TABS
ORAL_TABLET | ORAL | Status: AC
  Filled 2018-05-12: qty 2

## 2018-05-12 MED ORDER — SODIUM CHLORIDE 0.9% FLUSH
10.0000 mL | INTRAVENOUS | Status: DC | PRN
  Administered 2018-05-12: 10 mL
  Filled 2018-05-12: qty 10

## 2018-05-12 MED ORDER — SODIUM CHLORIDE 0.9 % IV SOLN
Freq: Once | INTRAVENOUS | Status: AC
  Administered 2018-05-12: 12:00:00 via INTRAVENOUS
  Filled 2018-05-12: qty 250

## 2018-05-12 MED ORDER — DIPHENHYDRAMINE HCL 25 MG PO CAPS
ORAL_CAPSULE | ORAL | Status: AC
  Filled 2018-05-12: qty 2

## 2018-05-12 MED ORDER — ACETAMINOPHEN 325 MG PO TABS
650.0000 mg | ORAL_TABLET | Freq: Once | ORAL | Status: AC
  Administered 2018-05-12: 650 mg via ORAL

## 2018-05-12 NOTE — Progress Notes (Signed)
Patient Care Team: [name], DO as PCP - General (Internal Medicine)  DIAGNOSIS:  Encounter Diagnosis  Name Primary?  . 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, estrogen receptor positive ([place])     SUMMARY OF ONCOLOGIC HISTORY:   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, estrogen receptor positive ([place])   06/23/2017 Initial Diagnosis    Left breast 11 o'clock position 6.3 x 4.2 x 6.1 cm mass with thick papillary projections, 2 adjacent left axillary lymph nodes; right breast 930 position 1.7 cm mass; left breast and left lymph node biopsy revealed IDC grade 3, ER 5%, PR 0%, Ki-67 50%, HER-2 positive ratio 2.04 T3N1 stage IIIa; right breast: Grade 1 IDC  ER 95%, PR 95%, Ki-67 15%, HER-2 negative; T1CN0 stage I a    08/27/2017 Surgery    Right simple mastectomy: IDC grade 1, 1.1 cm, DCIS low-grade, margins negative, 0/2 lymph nodes negative; ER 95%, PR 95%, HER-2 negative Ki-67 15% T1 cN0 stage I a left simple mastectomy: IDC grade 3, 6.2 cm, 4/13 lymph nodes positive, margins negative, ER 5 %, PR 0%, HER-2 positive ratio 2.04, Ki-67 50 % T3 N2 stage IIIa     10/14/2017 -  Chemotherapy    Adjuvant Herceptin alone for 1 year     10/23/2017 - 11/25/2017 Radiation Therapy    Adjuvant radiation therapy    12/05/2017 Genetic Testing    The Common Hereditary Cancer Panel offered by [facility] includes sequencing and/or deletion duplication testing of the following 47 genes: APC, ATM, AXIN2, BARD1, BMPR1A, BRCA1, BRCA2, BRIP1, CDH1, CDKN2A (p14ARF), CDKN2A (p16INK4a), CKD4, CHEK2, CTNNA1, DICER1, EPCAM (Deletion/duplication testing only), GREM1 (promoter region deletion/duplication testing only), KIT, MEN1, MLH1, MSH2, MSH3, MSH6, MUTYH, NBN, NF1, NHTL1, PALB2, PDGFRA, PMS2, POLD1, POLE, PTEN, RAD50, RAD51C, RAD51D, SDHB, SDHC, SDHD, SMAD4, SMARCA4. STK11, TP53, TSC1, TSC2, and VHL.  The following genes were evaluated for sequence changes only: SDHA  and HOXB13 c.251G>A variant only.  Results: Negative, no pathogenic variants identified.  The date of this test report is 12/05/2017.      CHIEF COMPLIANT: Follow-up on Herceptin therapy  INTERVAL HISTORY: PAISLEY GRAJEDA is a 72-year with above-mentioned history of HER-2 positive breast cancer is currently on adjuvant therapy and with Herceptin.  She is tolerating extremely well.  She has no side effects whatsoever.  Her last ejection fraction on echocardiogram was 50%.  She follows with Dr. Aundra Dubin.  He is planning on performing a cardiac MRI.  REVIEW OF SYSTEMS:   Constitutional: Denies fevers, chills or abnormal weight loss Eyes: Denies blurriness of vision Ears, nose, mouth, throat, and face: Denies mucositis or sore throat Respiratory: Denies cough, dyspnea or wheezes Cardiovascular: Denies palpitation, chest discomfort Gastrointestinal:  Denies nausea, heartburn or change in bowel habits Skin: Denies abnormal skin rashes Lymphatics: Denies new lymphadenopathy or easy bruising Neurological:Denies numbness, tingling or new weaknesses Behavioral/Psych: Mood is stable, no new changes  Extremities: No lower extremity edema All other systems were reviewed with the patient and are negative.  I have reviewed the past medical history, past surgical history, social history and family history with the patient and they are unchanged from previous note.  ALLERGIES:  has No Known Allergies.  MEDICATIONS:  Current Outpatient Medications  Medication Sig Dispense Refill  . carvedilol (COREG) 12.5 MG tablet Take 1 tablet (12.5 mg total) by mouth 2 (two) times daily with a meal. 60 tablet 3  . donepezil (ARICEPT) 10 MG tablet Take  1 tablet (10 mg total) by mouth at bedtime. 90 tablet 1  . ELIQUIS 5 MG TABS tablet TAKE 1 TABLET BY MOUTH TWICE A DAY 60 tablet 4  . furosemide (LASIX) 80 MG tablet TAKE 1 TABLET BY MOUTH EVERY DAY 90 tablet 1  . letrozole (FEMARA) 2.5 MG tablet Take 1 tablet (2.5 mg  total) by mouth daily. 90 tablet 3  . metFORMIN (GLUCOPHAGE-XR) 500 MG 24 hr tablet TAKE 1 TABLET (500 MG TOTAL) BY MOUTH 2 (TWO) TIMES DAILY WITH A MEAL. 180 tablet 1  . Multiple Vitamins-Minerals (ADULT GUMMY PO) Take 1 tablet by mouth 3 (three) times a week.    . potassium chloride SA (K-DUR,KLOR-CON) 20 MEQ tablet Take 1 tablet (20 mEq total) by mouth daily. 30 tablet 3  . sacubitril-valsartan (ENTRESTO) 24-26 MG Take 1 tablet by mouth 2 (two) times daily. 60 tablet 3   No current facility-administered medications for this visit.     PHYSICAL EXAMINATION: ECOG PERFORMANCE STATUS: 1 - Symptomatic but completely ambulatory  Vitals:   05/12/18 1046  BP: (!) 149/60  Pulse: 63  Resp: 17  Temp: 98.4 F (36.9 C)  SpO2: 99%   Filed Weights   05/12/18 1046  Weight: 164 lb 12.8 oz (74.8 kg)    GENERAL:alert, no distress and comfortable SKIN: skin color, texture, turgor are normal, no rashes or significant lesions EYES: normal, Conjunctiva are pink and non-injected, sclera clear OROPHARYNX:no exudate, no erythema and lips, buccal mucosa, and tongue normal  NECK: supple, thyroid normal size, non-tender, without nodularity LYMPH:  no palpable lymphadenopathy in the cervical, axillary or inguinal LUNGS: clear to auscultation and percussion with normal breathing effort HEART: regular rate & rhythm and no murmurs and no lower extremity edema ABDOMEN:abdomen soft, non-tender and normal bowel sounds MUSCULOSKELETAL:no cyanosis of digits and no clubbing  NEURO: alert & oriented x 3 with fluent speech, no focal motor/sensory deficits EXTREMITIES: No lower extremity edema   LABORATORY DATA:  I have reviewed the data as listed CMP Latest Ref Rng & Units 03/31/2018 03/13/2018 02/17/2018  Glucose 70 - 99 mg/dL 179(H) 142(H) 172(H)  BUN 8 - 23 mg/dL _0 Creatinine 0.44 - 1.00 mg/dL 0.78 0.65 0.81  Sodium 135 - 145 mmol/L 143 142 143  Potassium 3.5 - 5.1 mmol/L 4.3 3.4(L) 3.4(L)  Chloride  98 - 111 mmol/L 107 107 106  CO2 22 - 32 mmol/L _1 Calcium 8.9 - 10.3 mg/dL 9.1 9.0 9.5  Total Protein 6.5 - 8.1 g/dL 6.5 - 6.4(L)  Total Bilirubin 0.3 - 1.2 mg/dL 0.6 - 0.5  Alkaline Phos 38 - 126 U/L 77 - 77  AST 15 - 41 U/L 11(L) - 11(L)  ALT 0 - 44 U/L 10 - 7    Lab Results  Component Value Date   WBC 5.5 05/12/2018   HGB 12.3 05/12/2018   HCT 36.5 05/12/2018   MCV 95.0 05/12/2018   PLT 158 05/12/2018   NEUTROABS 4.6 05/12/2018    ASSESSMENT & PLAN:  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, estrogen receptor positive ([place]) Right simple mastectomy: IDC grade 1, 1.1 cm, DCIS low-grade, margins negative, 0/2 lymph nodes negative; ER 95%, PR 95%, HER-2 negative Ki-67 15%T1 cN0 stage I a left simple mastectomy: IDC grade 3, 6.2 cm, 4/13 lymph nodes positive, margins negative, ER 5 %, PR 0%, HER-2 positive ratio 2.04, Ki-67 50 % T3 N2 stage IIIa Echocardiogram 09/24/2017: EF 50%  Recommendation: 1.  Adjuvant Herceptinstarted 10/14/17 2. Adjuvant radiation therapy started 10/23/17-11/25/17 3. adjuvant antiestrogen therapystarted February 2019 --------------------------------------------------  Herceptin Toxicities: Tolerating it well Monitoring closely for cardiac ejection fraction 03/02/2018 EF 45% follows with Dr. Marigene Ehlers. September 2019 echocardiogram revealed EF 50%.  Dr. Aundra Dubin is planning to do cardiac MRI to rule out infiltrative diseases.  Letrozole toxicities:She is tolerating it extremely well. Denies any hot flashes or arthralgias or myalgias.  Return to clinic every 3 weeks for Herceptin every 6 weeks for follow-up with me.  She will finish Herceptin in November. After that we can see her in 6 months and after that once a year.   No orders of the defined types were placed in this encounter.  The patient has a good understanding of the overall plan. she agrees with it. she will call with any problems that may develop before the next  visit here.   Harriette Ohara, MD 05/12/18

## 2018-05-12 NOTE — Patient Instructions (Signed)
[facility] Discharge Instructions for Patients Receiving Chemotherapy  Today you received the following chemotherapy agents Herceptin  To help prevent nausea and vomiting after your treatment, we encourage you to take your nausea medication as directed   If you develop nausea and vomiting that is not controlled by your nausea medication, call the clinic.   BELOW ARE SYMPTOMS THAT SHOULD BE REPORTED IMMEDIATELY:  *FEVER GREATER THAN 100.5 F  *CHILLS WITH OR WITHOUT FEVER  NAUSEA AND VOMITING THAT IS NOT CONTROLLED WITH YOUR NAUSEA MEDICATION  *UNUSUAL SHORTNESS OF BREATH  *UNUSUAL BRUISING OR BLEEDING  TENDERNESS IN MOUTH AND THROAT WITH OR WITHOUT PRESENCE OF ULCERS  *URINARY PROBLEMS  *BOWEL PROBLEMS  UNUSUAL RASH Items with * indicate a potential emergency and should be followed up as soon as possible.  Feel free to call the clinic should you have any questions or concerns. The clinic phone number is [phone].  Please show the CHEMO ALERT CARD at check-in to the Emergency Department and triage nurse.   

## 2018-05-12 NOTE — Assessment & Plan Note (Signed)
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 Adjuvant Herceptinstarted 10/14/17 2. Adjuvant radiation therapy started 10/23/17-11/25/17 3. adjuvant antiestrogen therapystarted February 2019 --------------------------------------------------  Herceptin Toxicities: Tolerating it well Monitoring closely for cardiac ejection fraction 03/02/2018 EF 45% follows with Dr. Marigene Ehlers. September 2019 echocardiogram revealed EF 50%.  Dr. Aundra Dubin is planning to do cardiac MRI to rule out infiltrative diseases.  Letrozole toxicities:She is tolerating it extremely well. Denies any hot flashes or arthralgias or myalgias.  Return to clinic every 3 weeks for Herceptin every 6 weeks for follow-up with me.

## 2018-05-20 ENCOUNTER — Encounter: Payer: Self-pay | Admitting: *Deleted

## 2018-05-21 ENCOUNTER — Other Ambulatory Visit (HOSPITAL_COMMUNITY): Payer: Self-pay | Admitting: Cardiology

## 2018-05-26 ENCOUNTER — Other Ambulatory Visit (HOSPITAL_COMMUNITY): Payer: Self-pay | Admitting: Cardiology

## 2018-05-29 ENCOUNTER — Ambulatory Visit (HOSPITAL_COMMUNITY)
Admission: RE | Admit: 2018-05-29 | Discharge: 2018-05-29 | Disposition: A | Discharge status: Home / Self Care | Payer: Medicare Other | Source: Ambulatory Visit | Attending: Cardiology | Admitting: Cardiology

## 2018-05-29 DIAGNOSIS — I429 Cardiomyopathy, unspecified: Secondary | ICD-10-CM | POA: Diagnosis not present

## 2018-05-29 DIAGNOSIS — I351 Nonrheumatic aortic (valve) insufficiency: Secondary | ICD-10-CM | POA: Diagnosis not present

## 2018-05-29 MED ORDER — GADOBUTROL 1 MMOL/ML IV SOLN
10.0000 mL | Freq: Once | INTRAVENOUS | Status: AC | PRN
  Administered 2018-05-29: 10 mL via INTRAVENOUS

## 2018-06-02 ENCOUNTER — Inpatient Hospital Stay: Payer: Medicare Other | Attending: Hematology and Oncology

## 2018-06-02 VITALS — BP 158/51 | HR 66 | Temp 98.0°F | Resp 20 | Ht 66.0 in | Wt 167.6 lb

## 2018-06-02 DIAGNOSIS — C50811 Malignant neoplasm of overlapping sites of right female breast: Secondary | ICD-10-CM

## 2018-06-02 DIAGNOSIS — C773 Secondary and unspecified malignant neoplasm of axilla and upper limb lymph nodes: Secondary | ICD-10-CM | POA: Diagnosis not present

## 2018-06-02 DIAGNOSIS — Z5112 Encounter for antineoplastic immunotherapy: Secondary | ICD-10-CM | POA: Diagnosis not present

## 2018-06-02 DIAGNOSIS — C50812 Malignant neoplasm of overlapping sites of left female breast: Secondary | ICD-10-CM | POA: Diagnosis not present

## 2018-06-02 DIAGNOSIS — Z170 Estrogen receptor positive status [ER+]: Secondary | ICD-10-CM

## 2018-06-02 MED ORDER — DIPHENHYDRAMINE HCL 25 MG PO CAPS
ORAL_CAPSULE | ORAL | Status: AC
  Filled 2018-06-02: qty 2

## 2018-06-02 MED ORDER — SODIUM CHLORIDE 0.9 % IV SOLN
Freq: Once | INTRAVENOUS | Status: AC
  Administered 2018-06-02: 11:00:00 via INTRAVENOUS
  Filled 2018-06-02: qty 250

## 2018-06-02 MED ORDER — HEPARIN SOD (PORK) LOCK FLUSH 100 UNIT/ML IV SOLN
500.0000 [IU] | Freq: Once | INTRAVENOUS | Status: AC | PRN
  Administered 2018-06-02: 500 [IU]
  Filled 2018-06-02: qty 5

## 2018-06-02 MED ORDER — TRASTUZUMAB CHEMO 150 MG IV SOLR
450.0000 mg | Freq: Once | INTRAVENOUS | Status: AC
  Administered 2018-06-02: 450 mg via INTRAVENOUS
  Filled 2018-06-02: qty 21.4

## 2018-06-02 MED ORDER — ACETAMINOPHEN 325 MG PO TABS
ORAL_TABLET | ORAL | Status: AC
  Filled 2018-06-02: qty 2

## 2018-06-02 MED ORDER — SODIUM CHLORIDE 0.9% FLUSH
10.0000 mL | INTRAVENOUS | Status: DC | PRN
  Administered 2018-06-02: 10 mL
  Filled 2018-06-02: qty 10

## 2018-06-02 MED ORDER — ACETAMINOPHEN 325 MG PO TABS
650.0000 mg | ORAL_TABLET | Freq: Once | ORAL | Status: AC
  Administered 2018-06-02: 650 mg via ORAL

## 2018-06-02 MED ORDER — DIPHENHYDRAMINE HCL 25 MG PO CAPS
50.0000 mg | ORAL_CAPSULE | Freq: Once | ORAL | Status: AC
  Administered 2018-06-02: 50 mg via ORAL

## 2018-06-02 NOTE — Progress Notes (Signed)
06/02/18   Proceed with Herceptin per [name] with current cardiac testing results.  T.O. Dr Jannifer Hick, PharmD

## 2018-06-02 NOTE — Patient Instructions (Signed)
[facility] Discharge Instructions for Patients Receiving Chemotherapy  Today you received the following chemotherapy agents: Trastuzumab (Herceptin).  To help prevent nausea and vomiting after your treatment, we encourage you to take your nausea medication as prescribed. If you develop nausea and vomiting that is not controlled by your nausea medication, call the clinic.   BELOW ARE SYMPTOMS THAT SHOULD BE REPORTED IMMEDIATELY:  *FEVER GREATER THAN 100.5 F  *CHILLS WITH OR WITHOUT FEVER  NAUSEA AND VOMITING THAT IS NOT CONTROLLED WITH YOUR NAUSEA MEDICATION  *UNUSUAL SHORTNESS OF BREATH  *UNUSUAL BRUISING OR BLEEDING  TENDERNESS IN MOUTH AND THROAT WITH OR WITHOUT PRESENCE OF ULCERS  *URINARY PROBLEMS  *BOWEL PROBLEMS  UNUSUAL RASH Items with * indicate a potential emergency and should be followed up as soon as possible.  Feel free to call the clinic should you have any questions or concerns. The clinic phone number is [phone].  Please show the CHEMO ALERT CARD at check-in to the Emergency Department and triage nurse.   

## 2018-06-10 ENCOUNTER — Telehealth (HOSPITAL_COMMUNITY): Payer: Self-pay

## 2018-06-10 NOTE — Telephone Encounter (Signed)
Pt called no answer voice mail was left for pt to call back Mildly decreased LV function, EF 45%.  No evidence for prior MI.

## 2018-06-11 ENCOUNTER — Other Ambulatory Visit: Payer: Self-pay | Admitting: Cardiovascular Disease

## 2018-06-17 ENCOUNTER — Telehealth (HOSPITAL_COMMUNITY): Payer: Self-pay

## 2018-06-17 NOTE — Telephone Encounter (Signed)
Pt called no answer voice mail was left for pt to call back Mildly decreased LV function, EF 45%. No evidence for prior MI.

## 2018-06-19 ENCOUNTER — Telehealth (HOSPITAL_COMMUNITY): Payer: Self-pay

## 2018-06-19 NOTE — Telephone Encounter (Signed)
Pt called no answer voice mail was left for pt to call back Mildly decreased LV function, EF 45%. No evidence for prior MI.

## 2018-06-23 ENCOUNTER — Inpatient Hospital Stay: Payer: Medicare Other

## 2018-06-23 ENCOUNTER — Inpatient Hospital Stay: Payer: Medicare Other | Attending: Oncology

## 2018-06-23 ENCOUNTER — Inpatient Hospital Stay (HOSPITAL_BASED_OUTPATIENT_CLINIC_OR_DEPARTMENT_OTHER): Payer: Medicare Other | Admitting: Hematology and Oncology

## 2018-06-23 ENCOUNTER — Ambulatory Visit: Payer: Medicare Other

## 2018-06-23 ENCOUNTER — Telehealth: Payer: Self-pay | Admitting: Hematology and Oncology

## 2018-06-23 ENCOUNTER — Other Ambulatory Visit: Payer: Medicare Other

## 2018-06-23 DIAGNOSIS — Z23 Encounter for immunization: Secondary | ICD-10-CM | POA: Insufficient documentation

## 2018-06-23 DIAGNOSIS — C50811 Malignant neoplasm of overlapping sites of right female breast: Secondary | ICD-10-CM

## 2018-06-23 DIAGNOSIS — Z79899 Other long term (current) drug therapy: Secondary | ICD-10-CM | POA: Insufficient documentation

## 2018-06-23 DIAGNOSIS — Z9013 Acquired absence of bilateral breasts and nipples: Secondary | ICD-10-CM

## 2018-06-23 DIAGNOSIS — Z95828 Presence of other vascular implants and grafts: Secondary | ICD-10-CM

## 2018-06-23 DIAGNOSIS — C50812 Malignant neoplasm of overlapping sites of left female breast: Secondary | ICD-10-CM

## 2018-06-23 DIAGNOSIS — R011 Cardiac murmur, unspecified: Secondary | ICD-10-CM

## 2018-06-23 DIAGNOSIS — C773 Secondary and unspecified malignant neoplasm of axilla and upper limb lymph nodes: Secondary | ICD-10-CM | POA: Insufficient documentation

## 2018-06-23 DIAGNOSIS — Z170 Estrogen receptor positive status [ER+]: Secondary | ICD-10-CM

## 2018-06-23 DIAGNOSIS — Z5112 Encounter for antineoplastic immunotherapy: Secondary | ICD-10-CM | POA: Diagnosis not present

## 2018-06-23 LAB — CMP (CANCER CENTER ONLY)
ALT: 9 U/L (ref 0–44)
AST: 10 U/L — AB (ref 15–41)
Albumin: 3.7 g/dL (ref 3.5–5.0)
Alkaline Phosphatase: 77 U/L (ref 38–126)
Anion gap: 10 (ref 5–15)
BILIRUBIN TOTAL: 0.5 mg/dL (ref 0.3–1.2)
BUN: 19 mg/dL (ref 8–23)
CHLORIDE: 108 mmol/L (ref 98–111)
CO2: 24 mmol/L (ref 22–32)
CREATININE: 0.83 mg/dL (ref 0.44–1.00)
Calcium: 9.2 mg/dL (ref 8.9–10.3)
GFR, Est AFR Am: >60 mL/min (ref >60)
Glucose, Bld: 213 mg/dL — ABNORMAL HIGH (ref 70–99)
Potassium: 4.2 mmol/L (ref 3.5–5.1)
Sodium: 142 mmol/L (ref 135–145)
TOTAL PROTEIN: 6.5 g/dL (ref 6.5–8.1)

## 2018-06-23 LAB — CBC WITH DIFFERENTIAL (CANCER CENTER ONLY)
ABS IMMATURE GRANULOCYTES: 0.01 10*3/uL (ref 0.00–0.07)
Basophils Absolute: 0 10*3/uL (ref 0.0–0.1)
Basophils Relative: 1 %
Eosinophils Absolute: 0.1 10*3/uL (ref 0.0–0.5)
Eosinophils Relative: 3 %
HCT: 37.1 % (ref 36.0–46.0)
Hemoglobin: 12.3 g/dL (ref 12.0–15.0)
IMMATURE GRANULOCYTES: 0 %
LYMPHS PCT: 12 %
Lymphs Abs: 0.5 10*3/uL — ABNORMAL LOW (ref 0.7–4.0)
MCH: 32.6 pg (ref 26.0–34.0)
MCHC: 33.2 g/dL (ref 30.0–36.0)
MCV: 98.4 fL (ref 80.0–100.0)
MONOS PCT: 7 %
Monocytes Absolute: 0.3 10*3/uL (ref 0.1–1.0)
Neutro Abs: 3 10*3/uL (ref 1.7–7.7)
Neutrophils Relative %: 77 %
Platelet Count: 153 10*3/uL (ref 150–400)
RBC: 3.77 MIL/uL — ABNORMAL LOW (ref 3.87–5.11)
RDW: 13 % (ref 11.5–15.5)
WBC: 3.9 10*3/uL — AB (ref 4.0–10.5)
nRBC: 0 % (ref 0.0–0.2)

## 2018-06-23 MED ORDER — INFLUENZA VAC SPLIT HIGH-DOSE 0.5 ML IM SUSY
0.5000 mL | PREFILLED_SYRINGE | Freq: Once | INTRAMUSCULAR | Status: AC
  Administered 2018-06-23: 0.5 mL via INTRAMUSCULAR
  Filled 2018-06-23: qty 0.5

## 2018-06-23 MED ORDER — ACETAMINOPHEN 325 MG PO TABS
650.0000 mg | ORAL_TABLET | Freq: Once | ORAL | Status: AC
  Administered 2018-06-23: 650 mg via ORAL

## 2018-06-23 MED ORDER — SODIUM CHLORIDE 0.9% FLUSH
10.0000 mL | INTRAVENOUS | Status: DC | PRN
  Administered 2018-06-23: 10 mL
  Filled 2018-06-23: qty 10

## 2018-06-23 MED ORDER — DIPHENHYDRAMINE HCL 25 MG PO CAPS
ORAL_CAPSULE | ORAL | Status: AC
  Filled 2018-06-23: qty 2

## 2018-06-23 MED ORDER — ACETAMINOPHEN 325 MG PO TABS
ORAL_TABLET | ORAL | Status: AC
  Filled 2018-06-23: qty 2

## 2018-06-23 MED ORDER — HEPARIN SOD (PORK) LOCK FLUSH 100 UNIT/ML IV SOLN
500.0000 [IU] | Freq: Once | INTRAVENOUS | Status: AC | PRN
  Administered 2018-06-23: 500 [IU]
  Filled 2018-06-23: qty 5

## 2018-06-23 MED ORDER — SODIUM CHLORIDE 0.9 % IV SOLN
Freq: Once | INTRAVENOUS | Status: AC
  Administered 2018-06-23: 12:00:00 via INTRAVENOUS
  Filled 2018-06-23: qty 250

## 2018-06-23 MED ORDER — DIPHENHYDRAMINE HCL 25 MG PO CAPS
50.0000 mg | ORAL_CAPSULE | Freq: Once | ORAL | Status: AC
  Administered 2018-06-23: 50 mg via ORAL

## 2018-06-23 MED ORDER — TRASTUZUMAB CHEMO 150 MG IV SOLR
450.0000 mg | Freq: Once | INTRAVENOUS | Status: AC
  Administered 2018-06-23: 450 mg via INTRAVENOUS
  Filled 2018-06-23: qty 21.43

## 2018-06-23 NOTE — Telephone Encounter (Signed)
Gave patient avs and calendar.   °

## 2018-06-23 NOTE — Progress Notes (Signed)
Patient Care Team: [name], DO as PCP - General (Internal Medicine)  DIAGNOSIS:    ICD-10-CM   1. 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, estrogen receptor positive ([place])   06/23/2017 Initial Diagnosis    Left breast 11 o'clock position 6.3 x 4.2 x 6.1 cm mass with thick papillary projections, 2 adjacent left axillary lymph nodes; right breast 930 position 1.7 cm mass; left breast and left lymph node biopsy revealed IDC grade 3, ER 5%, PR 0%, Ki-67 50%, HER-2 positive ratio 2.04 T3N1 stage IIIa; right breast: Grade 1 IDC  ER 95%, PR 95%, Ki-67 15%, HER-2 negative; T1CN0 stage I a    08/27/2017 Surgery    Right simple mastectomy: IDC grade 1, 1.1 cm, DCIS low-grade, margins negative, 0/2 lymph nodes negative; ER 95%, PR 95%, HER-2 negative Ki-67 15% T1 cN0 stage I a left simple mastectomy: IDC grade 3, 6.2 cm, 4/13 lymph nodes positive, margins negative, ER 5 %, PR 0%, HER-2 positive ratio 2.04, Ki-67 50 % T3 N2 stage IIIa     10/14/2017 -  Chemotherapy    Adjuvant Herceptin alone for 1 year     10/23/2017 - 11/25/2017 Radiation Therapy    Adjuvant radiation therapy    12/05/2017 Genetic Testing    The Common Hereditary Cancer Panel offered by [facility] includes sequencing and/or deletion duplication testing of the following 47 genes: APC, ATM, AXIN2, BARD1, BMPR1A, BRCA1, BRCA2, BRIP1, CDH1, CDKN2A (p14ARF), CDKN2A (p16INK4a), CKD4, CHEK2, CTNNA1, DICER1, EPCAM (Deletion/duplication testing only), GREM1 (promoter region deletion/duplication testing only), KIT, MEN1, MLH1, MSH2, MSH3, MSH6, MUTYH, NBN, NF1, NHTL1, PALB2, PDGFRA, PMS2, POLD1, POLE, PTEN, RAD50, RAD51C, RAD51D, SDHB, SDHC, SDHD, SMAD4, SMARCA4. STK11, TP53, TSC1, TSC2, and VHL.  The following genes were evaluated for sequence changes  only: SDHA and HOXB13 c.251G>A variant only.  Results: Negative, no pathogenic variants identified.  The date of this test report is 12/05/2017.     CHIEF COMPLIANT: Follow-up on Herceptin therapy  INTERVAL HISTORY: Charlene Rubio is a 72 y.o. with above-mentioned history of HER-2 positive breast cancer is currently on adjuvant therapy and with Herceptin and anti-estrogen therapy with Letrozole.   She notes she is doing well. She denies any concerns with Herceptin, including no diarrhea. Plan to complete Herceptin on 09/15/18. She plans to see cardiologist on 08/05/18 with her next ECHO. She is aware of her heart murmur.   REVIEW OF SYSTEMS:   Constitutional: Denies fevers, chills or abnormal weight loss Eyes: Denies blurriness of vision Ears, nose, mouth, throat, and face: Denies mucositis or sore throat Respiratory: Denies cough, dyspnea or wheezes Cardiovascular: Denies palpitation, chest discomfort Gastrointestinal:  Denies nausea, heartburn or change in bowel habits Skin: Denies abnormal skin rashes Lymphatics: Denies new lymphadenopathy or easy bruising Neurological:Denies numbness, tingling or new weaknesses Behavioral/Psych: Mood is stable, no new changes  Extremities: No lower extremity edema Breast: denies any pain or lumps or nodules in either breasts All other systems were reviewed with the patient and are negative.  I have reviewed the past medical history, past surgical history, social history and family history with the patient and they are unchanged from previous note.  ALLERGIES:  has No Known Allergies.  MEDICATIONS:  Current Outpatient Medications  Medication Sig Dispense Refill  . carvedilol (COREG)  12.5 MG tablet TAKE 1 TABLET (12.5 MG TOTAL) BY MOUTH 2 (TWO) TIMES DAILY WITH A MEAL. 180 tablet 3  . donepezil (ARICEPT) 10 MG tablet Take 1 tablet (10 mg total) by mouth at bedtime. 90 tablet 1  . ELIQUIS 5 MG TABS tablet TAKE 1 TABLET BY MOUTH TWICE A DAY 180  tablet 1  . furosemide (LASIX) 80 MG tablet TAKE 1 TABLET BY MOUTH EVERY DAY 90 tablet 1  . KLOR-CON M20 20 MEQ tablet TAKE 1 TABLET BY MOUTH EVERY DAY 90 tablet 1  . letrozole (FEMARA) 2.5 MG tablet Take 1 tablet (2.5 mg total) by mouth daily. 90 tablet 3  . metFORMIN (GLUCOPHAGE-XR) 500 MG 24 hr tablet TAKE 1 TABLET (500 MG TOTAL) BY MOUTH 2 (TWO) TIMES DAILY WITH A MEAL. 180 tablet 1  . Multiple Vitamins-Minerals (ADULT GUMMY PO) Take 1 tablet by mouth 3 (three) times a week.    . sacubitril-valsartan (ENTRESTO) 24-26 MG Take 1 tablet by mouth 2 (two) times daily. 60 tablet 3   No current facility-administered medications for this visit.     PHYSICAL EXAMINATION: ECOG PERFORMANCE STATUS: 1 - Symptomatic but completely ambulatory  Vitals:   06/23/18 1108  BP: (!) 149/48  Pulse: (!) 59  Resp: 16  Temp: 98.4 F (36.9 C)  SpO2: 99%   Filed Weights   06/23/18 1108  Weight: 166 lb 6.4 oz (75.5 kg)    GENERAL:alert, no distress and comfortable SKIN: skin color, texture, turgor are normal, no rashes or significant lesions EYES: normal, Conjunctiva are pink and non-injected, sclera clear OROPHARYNX:no exudate, no erythema and lips, buccal mucosa, and tongue normal  NECK: supple, thyroid normal size, non-tender, without nodularity LYMPH:  no palpable lymphadenopathy in the cervical, axillary or inguinal LUNGS: clear to auscultation and percussion with normal breathing effort HEART: regular rate & rhythm and no murmurs and no lower extremity edema ABDOMEN:abdomen soft, non-tender and normal bowel sounds MUSCULOSKELETAL:no cyanosis of digits and no clubbing  NEURO: alert & oriented x 3 with fluent speech, no focal motor/sensory deficits EXTREMITIES: No lower extremity edema   LABORATORY DATA:  I have reviewed the data as listed CMP Latest Ref Rng & Units 05/12/2018 03/31/2018 03/13/2018  Glucose 70 - 99 mg/dL 208(H) 179(H) 142(H)  BUN 8 - 23 mg/dL '17 18 15  ' Creatinine 0.44 - 1.00  mg/dL 0.77 0.78 0.65  Sodium 135 - 145 mmol/L 144 143 142  Potassium 3.5 - 5.1 mmol/L 3.4(L) 4.3 3.4(L)  Chloride 98 - 111 mmol/L 107 107 107  CO2 22 - 32 mmol/L '26 23 24  ' Calcium 8.9 - 10.3 mg/dL 9.1 9.1 9.0  Total Protein 6.5 - 8.1 g/dL 6.4(L) 6.5 -  Total Bilirubin 0.3 - 1.2 mg/dL 0.5 0.6 -  Alkaline Phos 38 - 126 U/L 83 77 -  AST 15 - 41 U/L 10(L) 11(L) -  ALT 0 - 44 U/L 8 10 -    Lab Results  Component Value Date   WBC 3.9 (L) 06/23/2018   HGB 12.3 06/23/2018   HCT 37.1 06/23/2018   MCV 98.4 06/23/2018   PLT 153 06/23/2018   NEUTROABS 3.0 06/23/2018    ASSESSMENT & PLAN:  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, estrogen receptor positive ([place]) Right simple mastectomy: IDC grade 1, 1.1 cm, DCIS low-grade, margins negative, 0/2 lymph nodes negative; ER 95%, PR 95%, HER-2 negative Ki-67 15%T1 cN0 stage I a left simple mastectomy: IDC grade 3, 6.2 cm, 4/13 lymph  nodes positive, margins negative, ER 5 %, PR 0%, HER-2 positive ratio 2.04, Ki-67 50 % T3 N2 stage IIIa Echocardiogram 09/24/2017: EF 50%  Recommendation: 1. Adjuvant Herceptinstarted 10/14/17 2. Adjuvant radiation therapy started 10/23/17-11/25/17 3. adjuvant antiestrogen therapystarted February 2019 --------------------------------------------------  Herceptin Toxicities: Tolerating it well Monitoring closely for cardiac ejection fraction 03/02/2018 EF 45% follows with Dr. Marigene Ehlers. September 2019 echocardiogram revealed EF 50%.   Cardiac MRI 05/29/2018: EF 45%, diffuse hypokinesis  Letrozole toxicities:She is tolerating it extremely well. Denies any hot flashes or arthralgias or myalgias.  Return to clinic every 3 weeks for Herceptin every 6 weeks for follow-up with me.  She will finish Herceptin in 09/15/2018.    No orders of the defined types were placed in this encounter.  The patient has a good understanding of the overall plan. she agrees with it. she will call with any problems  that may develop before the next visit here.  Nicholas Lose, MD 06/23/2018  Oneal Deputy, am acting as scribe for Nicholas Lose, MD.  I have reviewed the above documentation for accuracy and completeness, and I agree with the above.

## 2018-06-23 NOTE — Patient Instructions (Addendum)
[facility] Discharge Instructions for Patients Receiving Chemotherapy  Today you received the following chemotherapy agents: Trastuzumab (Herceptin)  To help prevent nausea and vomiting after your treatment, we encourage you to take your nausea medication as directed.    If you develop nausea and vomiting that is not controlled by your nausea medication, call the clinic.   BELOW ARE SYMPTOMS THAT SHOULD BE REPORTED IMMEDIATELY:  *FEVER GREATER THAN 100.5 F  *CHILLS WITH OR WITHOUT FEVER  NAUSEA AND VOMITING THAT IS NOT CONTROLLED WITH YOUR NAUSEA MEDICATION  *UNUSUAL SHORTNESS OF BREATH  *UNUSUAL BRUISING OR BLEEDING  TENDERNESS IN MOUTH AND THROAT WITH OR WITHOUT PRESENCE OF ULCERS  *URINARY PROBLEMS  *BOWEL PROBLEMS  UNUSUAL RASH Items with * indicate a potential emergency and should be followed up as soon as possible.  Feel free to call the clinic should you have any questions or concerns. The clinic phone number is [phone].  Please show the [place] at check-in to the Emergency Department and triage nurse.  Influenza (Flu) Vaccine (Inactivated or Recombinant): What You Need to Know 1. Why get vaccinated? Influenza ("flu") is a contagious disease that spreads around the [place] every year, usually between October and May. Flu is caused by influenza viruses, and is spread mainly by coughing, sneezing, and close contact. Anyone can get flu. Flu strikes suddenly and can last several days. Symptoms vary by age, but can include:  fever/chills  sore throat  muscle aches  fatigue  cough  headache  runny or stuffy nose  Flu can also lead to pneumonia and blood infections, and cause diarrhea and seizures in children. If you have a medical condition, such as heart or lung disease, flu can make it worse. Flu is more dangerous for some people. Infants and young children, people 73 years of age and older, pregnant women, and people with  certain health conditions or a weakened immune system are at greatest risk. Each year thousands of people in the [place] die from flu, and many more are hospitalized. Flu vaccine can:  keep you from getting flu,  make flu less severe if you do get it, and  keep you from spreading flu to your family and other people. 2. Inactivated and recombinant flu vaccines A dose of flu vaccine is recommended every flu season. Children 6 months through 50 years of age may need two doses during the same flu season. Everyone else needs only one dose each flu season. Some inactivated flu vaccines contain a very small amount of a mercury-based preservative called thimerosal. Studies have not shown thimerosal in vaccines to be harmful, but flu vaccines that do not contain thimerosal are available. There is no live flu virus in flu shots. They cannot cause the flu. There are many flu viruses, and they are always changing. Each year a new flu vaccine is made to protect against three or four viruses that are likely to cause disease in the upcoming flu season. But even when the vaccine doesn't exactly match these viruses, it may still provide some protection. Flu vaccine cannot prevent:  flu that is caused by a virus not covered by the vaccine, or  illnesses that look like flu but are not.  It takes about 2 weeks for protection to develop after vaccination, and protection lasts through the flu season. 3. Some people should not get this vaccine Tell the person who is giving you the vaccine:  If you have any severe, life-threatening allergies.  If you ever had a life-threatening allergic reaction after a dose of flu vaccine, or have a severe allergy to any part of this vaccine, you may be advised not to get vaccinated. Most, but not all, types of flu vaccine contain a small amount of egg protein.  If you ever had Guillain-Barr Syndrome (also called GBS). Some people with a history of GBS should not get this  vaccine. This should be discussed with your doctor.  If you are not feeling well. It is usually okay to get flu vaccine when you have a mild illness, but you might be asked to come back when you feel better.  4. Risks of a vaccine reaction With any medicine, including vaccines, there is a chance of reactions. These are usually mild and go away on their own, but serious reactions are also possible. Most people who get a flu shot do not have any problems with it. Minor problems following a flu shot include:  soreness, redness, or swelling where the shot was given  hoarseness  sore, red or itchy eyes  cough  fever  aches  headache  itching  fatigue  If these problems occur, they usually begin soon after the shot and last 1 or 2 days. More serious problems following a flu shot can include the following:  There may be a small increased risk of Guillain-Barre Syndrome (GBS) after inactivated flu vaccine. This risk has been estimated at 1 or 2 additional cases per million people vaccinated. This is much lower than the risk of severe complications from flu, which can be prevented by flu vaccine.  Young children who get the flu shot along with pneumococcal vaccine (PCV13) and/or DTaP vaccine at the same time might be slightly more likely to have a seizure caused by fever. Ask your doctor for more information. Tell your doctor if a child who is getting flu vaccine has ever had a seizure.  Problems that could happen after any injected vaccine:  People sometimes faint after a medical procedure, including vaccination. Sitting or lying down for about 15 minutes can help prevent fainting, and injuries caused by a fall. Tell your doctor if you feel dizzy, or have vision changes or ringing in the ears.  Some people get severe pain in the shoulder and have difficulty moving the arm where a shot was given. This happens very rarely.  Any medication can cause a severe allergic reaction. Such  reactions from a vaccine are very rare, estimated at about 1 in a million doses, and would happen within a few minutes to a few hours after the vaccination. As with any medicine, there is a very remote chance of a vaccine causing a serious injury or death. The safety of vaccines is always being monitored. For more information, visit: http://www.aguilar.org/ 5. What if there is a serious reaction? What should I look for? Look for anything that concerns you, such as signs of a severe allergic reaction, very high fever, or unusual behavior. Signs of a severe allergic reaction can include hives, swelling of the face and throat, difficulty breathing, a fast heartbeat, dizziness, and weakness. These would start a few minutes to a few hours after the vaccination. What should I do?  If you think it is a severe allergic reaction or other emergency that can't wait, call 9-1-1 and get the person to the nearest hospital. Otherwise, call your doctor.  Reactions should be reported to the Vaccine Adverse Event Reporting System (VAERS). Your doctor should file this report,  or you can do it yourself through the VAERS web site at www.vaers.SamedayNews.es, or by calling 437-174-6851. ? VAERS does not give medical advice. 6. The National Vaccine Injury Compensation Program The [employer] Vaccine Injury Compensation Program (VICP) is a federal program that was created to compensate people who may have been injured by certain vaccines. Persons who believe they may have been injured by a vaccine can learn about the program and about filing a claim by calling 8134456641 or visiting the [employer] website at GoldCloset.com.ee. There is a time limit to file a claim for compensation. 7. How can I learn more?  Ask your healthcare provider. He or she can give you the vaccine package insert or suggest other sources of information.  Call your local or state health department.  Contact the Centers for Disease Control  and Prevention (CDC): ? Call 901-101-6259 (1-800-CDC-INFO) or ? Visit CDC's website at https://gibson.com/ Vaccine Information Statement, Inactivated Influenza Vaccine (03/25/2014) This information is not intended to replace advice given to you by your health care provider. Make sure you discuss any questions you have with your health care provider. Document Released: 05/30/2006 Document Revised: 04/25/2016 Document Reviewed: 04/25/2016 Elsevier Interactive Patient Education  2017 [employer].

## 2018-06-23 NOTE — Assessment & Plan Note (Signed)
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 Adjuvant Herceptinstarted 10/14/17 2. Adjuvant radiation therapy started 10/23/17-11/25/17 3. adjuvant antiestrogen therapystarted February 2019 --------------------------------------------------  Herceptin Toxicities: Tolerating it well Monitoring closely for cardiac ejection fraction 03/02/2018 EF 45% follows with Dr. Marigene Ehlers. September 2019 echocardiogram revealed EF 50%.   Cardiac MRI 05/29/2018: EF 45%, diffuse hypokinesis  Letrozole toxicities:She is tolerating it extremely well. Denies any hot flashes or arthralgias or myalgias.  Return to clinic every 3 weeks for Herceptin every 6 weeks for follow-up with me.  She will finish Herceptin in February 2020.

## 2018-06-24 ENCOUNTER — Encounter (HOSPITAL_COMMUNITY): Payer: Self-pay | Admitting: *Deleted

## 2018-07-06 ENCOUNTER — Other Ambulatory Visit: Payer: Self-pay | Admitting: Cardiology

## 2018-07-09 ENCOUNTER — Other Ambulatory Visit: Payer: Self-pay | Admitting: *Deleted

## 2018-07-09 DIAGNOSIS — R413 Other amnesia: Secondary | ICD-10-CM

## 2018-07-09 MED ORDER — DONEPEZIL HCL 10 MG PO TABS: 10.0000 mg | ORAL_TABLET | Freq: Every day | ORAL | 1 refills | Status: DC

## 2018-07-09 NOTE — Telephone Encounter (Signed)
[facility] 

## 2018-07-13 ENCOUNTER — Inpatient Hospital Stay: Payer: Medicare Other

## 2018-07-13 VITALS — BP 138/54 | HR 62 | Temp 98.5°F | Resp 14

## 2018-07-13 DIAGNOSIS — C50811 Malignant neoplasm of overlapping sites of right female breast: Secondary | ICD-10-CM | POA: Diagnosis not present

## 2018-07-13 DIAGNOSIS — Z170 Estrogen receptor positive status [ER+]: Secondary | ICD-10-CM | POA: Diagnosis not present

## 2018-07-13 DIAGNOSIS — Z5112 Encounter for antineoplastic immunotherapy: Secondary | ICD-10-CM | POA: Diagnosis not present

## 2018-07-13 DIAGNOSIS — C773 Secondary and unspecified malignant neoplasm of axilla and upper limb lymph nodes: Secondary | ICD-10-CM | POA: Diagnosis not present

## 2018-07-13 DIAGNOSIS — Z79899 Other long term (current) drug therapy: Secondary | ICD-10-CM | POA: Diagnosis not present

## 2018-07-13 DIAGNOSIS — Z23 Encounter for immunization: Secondary | ICD-10-CM | POA: Diagnosis not present

## 2018-07-13 DIAGNOSIS — C50812 Malignant neoplasm of overlapping sites of left female breast: Secondary | ICD-10-CM | POA: Diagnosis not present

## 2018-07-13 MED ORDER — ACETAMINOPHEN 325 MG PO TABS
650.0000 mg | ORAL_TABLET | Freq: Once | ORAL | Status: AC
  Administered 2018-07-13: 650 mg via ORAL

## 2018-07-13 MED ORDER — SODIUM CHLORIDE 0.9% FLUSH
10.0000 mL | INTRAVENOUS | Status: DC | PRN
  Administered 2018-07-13: 10 mL
  Filled 2018-07-13: qty 10

## 2018-07-13 MED ORDER — TRASTUZUMAB CHEMO 150 MG IV SOLR
450.0000 mg | Freq: Once | INTRAVENOUS | Status: AC
  Administered 2018-07-13: 450 mg via INTRAVENOUS
  Filled 2018-07-13: qty 21.43

## 2018-07-13 MED ORDER — DIPHENHYDRAMINE HCL 25 MG PO CAPS
50.0000 mg | ORAL_CAPSULE | Freq: Once | ORAL | Status: AC
  Administered 2018-07-13: 50 mg via ORAL

## 2018-07-13 MED ORDER — ACETAMINOPHEN 325 MG PO TABS
ORAL_TABLET | ORAL | Status: AC
  Filled 2018-07-13: qty 2

## 2018-07-13 MED ORDER — DIPHENHYDRAMINE HCL 25 MG PO CAPS
ORAL_CAPSULE | ORAL | Status: AC
  Filled 2018-07-13: qty 2

## 2018-07-13 MED ORDER — HEPARIN SOD (PORK) LOCK FLUSH 100 UNIT/ML IV SOLN
500.0000 [IU] | Freq: Once | INTRAVENOUS | Status: AC | PRN
  Administered 2018-07-13: 500 [IU]
  Filled 2018-07-13: qty 5

## 2018-07-13 MED ORDER — SODIUM CHLORIDE 0.9 % IV SOLN
Freq: Once | INTRAVENOUS | Status: AC
  Administered 2018-07-13: 09:00:00 via INTRAVENOUS
  Filled 2018-07-13: qty 250

## 2018-07-13 NOTE — Patient Instructions (Signed)
[facility] Discharge Instructions for Patients Receiving Chemotherapy  Today you received the following chemotherapy agents: Trastuzumab (Herceptin).  To help prevent nausea and vomiting after your treatment, we encourage you to take your nausea medication as prescribed. If you develop nausea and vomiting that is not controlled by your nausea medication, call the clinic.   BELOW ARE SYMPTOMS THAT SHOULD BE REPORTED IMMEDIATELY:  *FEVER GREATER THAN 100.5 F  *CHILLS WITH OR WITHOUT FEVER  NAUSEA AND VOMITING THAT IS NOT CONTROLLED WITH YOUR NAUSEA MEDICATION  *UNUSUAL SHORTNESS OF BREATH  *UNUSUAL BRUISING OR BLEEDING  TENDERNESS IN MOUTH AND THROAT WITH OR WITHOUT PRESENCE OF ULCERS  *URINARY PROBLEMS  *BOWEL PROBLEMS  UNUSUAL RASH Items with * indicate a potential emergency and should be followed up as soon as possible.  Feel free to call the clinic should you have any questions or concerns. The clinic phone number is [phone].  Please show the CHEMO ALERT CARD at check-in to the Emergency Department and triage nurse.   

## 2018-07-15 ENCOUNTER — Other Ambulatory Visit: Payer: Medicare Other

## 2018-07-15 DIAGNOSIS — E118 Type 2 diabetes mellitus with unspecified complications: Secondary | ICD-10-CM | POA: Diagnosis not present

## 2018-07-15 DIAGNOSIS — Z9189 Other specified personal risk factors, not elsewhere classified: Secondary | ICD-10-CM | POA: Diagnosis not present

## 2018-07-15 DIAGNOSIS — Z1159 Encounter for screening for other viral diseases: Secondary | ICD-10-CM | POA: Diagnosis not present

## 2018-07-17 LAB — HEMOGLOBIN A1C
EAG (MMOL/L): 7.6 (calc)
Hgb A1c MFr Bld: 6.4 % of total Hgb — ABNORMAL HIGH (ref <5.7)
Mean Plasma Glucose: 137 (calc)

## 2018-07-17 LAB — LIPID PANEL
CHOLESTEROL: 148 mg/dL (ref <200)
HDL: 52 mg/dL (ref >50)
LDL CHOLESTEROL (CALC): 75 mg/dL
NON-HDL CHOLESTEROL (CALC): 96 mg/dL (ref <130)
Total CHOL/HDL Ratio: 2.8 (calc) (ref <5.0)
Triglycerides: 131 mg/dL (ref <150)

## 2018-07-17 LAB — HEPATITIS C ANTIBODY
HEP C AB: NONREACTIVE
SIGNAL TO CUT-OFF: 0.01 (ref <1.00)

## 2018-07-20 ENCOUNTER — Encounter: Payer: Self-pay | Admitting: Nurse Practitioner

## 2018-07-20 ENCOUNTER — Ambulatory Visit (INDEPENDENT_AMBULATORY_CARE_PROVIDER_SITE_OTHER): Payer: Medicare Other | Admitting: Nurse Practitioner

## 2018-07-20 VITALS — BP 136/64 | HR 60 | Temp 98.0°F | Ht 66.0 in | Wt 167.6 lb

## 2018-07-20 DIAGNOSIS — I5022 Chronic systolic (congestive) heart failure: Secondary | ICD-10-CM

## 2018-07-20 DIAGNOSIS — C50911 Malignant neoplasm of unspecified site of right female breast: Secondary | ICD-10-CM

## 2018-07-20 DIAGNOSIS — E118 Type 2 diabetes mellitus with unspecified complications: Secondary | ICD-10-CM

## 2018-07-20 DIAGNOSIS — C50912 Malignant neoplasm of unspecified site of left female breast: Secondary | ICD-10-CM

## 2018-07-20 DIAGNOSIS — F0390 Unspecified dementia without behavioral disturbance: Secondary | ICD-10-CM

## 2018-07-20 DIAGNOSIS — I10 Essential (primary) hypertension: Secondary | ICD-10-CM

## 2018-07-20 DIAGNOSIS — I825Y2 Chronic embolism and thrombosis of unspecified deep veins of left proximal lower extremity: Secondary | ICD-10-CM

## 2018-07-20 MED ORDER — MEMANTINE HCL-DONEPEZIL HCL 28-10 MG PO CP24
1.0000 | ORAL_CAPSULE | Freq: Every day | ORAL | 2 refills | Status: DC
Start: 2018-07-20 — End: 2019-03-11

## 2018-07-20 NOTE — Progress Notes (Signed)
Careteam: Patient Care Team: Lauree Chandler, NP as PCP - General (Geriatric Medicine)  Advanced Directive information Does Patient Have a Medical Advance Directive?: Yes, Type of Advance Directive: Out of facility DNR (pink MOST or yellow form), Pre-existing out of facility DNR order (yellow form or pink MOST form): Yellow form placed in chart (order not valid for inpatient use), Does patient want to make changes to medical advance directive?: No - Patient declined  No Known Allergies  Chief Complaint  Patient presents with  . Medical Management of Chronic Issues    4 month follow up,has cologuard kit at home, undecided about DEXA scan     HPI: Patient is a 72 y.o. female seen in the office today for routine follow up. Former pt of Dr Eulas Post. Pt with hx of dementia and poor historian. Husband ask to speak to me prior to going in with wife. Reports he does not want to do anything extra however still under treatment for breast cancer and would like to do cologuard because if abnormal would want further investigation.   Unsure if they want to do dexa scan.   B/l breast cancer - invasive ductal cell and DCIS with (+) left LN mets; She underwent left breast MRM and right total mastectomy with sentinel node bx on 08/27/17. Followed by surgery and H/O (Dr Lindi Adie). She is taking femara. She had port-a-cath placed in 09/2017 and started chemotx (herceptin) in Feb 2019. She completed 26 XRT (Dr Isidore Moos) in January she will complete treatment.   HTN - BP suboptimally controlled on lisinopril, coreg, imdur, lasix. She is on eliquis and ASA  Dementia - stable on aricept. She has not seen neurology Dr [name] does not really wish to follow up, lots of Dr appts. Both her parents had dementia.   DM - controlled on metformin. A1c 6.4%. No low BS reactions.   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 No bleeding or easy bruising.  Mild AS/mod AR/combined s/d CHF - followed by cardio Dr  Aundra Dubin. She takes coreg, lasix with KCl, entresto. EF 45% (July 2019) with diffuse hypokinesis, mod DD, mild MR, mod dilated LA.   Review of Systems:  Review of Systems  Unable to perform ROS: Dementia    Past Medical History:  Diagnosis Date  . Acute diastolic (congestive) heart failure ([place]) 04/25/2017  . Acute respiratory failure with hypoxia and hypercapnia ([place]) 04/25/2017  . ALLERGIC RHINITIS 07/01/2007   Qualifier: Diagnosis of  By: Sherren Mocha MD, Jory Ee   . Allergy   . Aortic insufficiency   . Aortic valve disorder 07/01/2007   Qualifier: Diagnosis of  By: Sherren Mocha MD, Jory Ee   . Arthritis   . Bilateral leg edema 04/25/2017  . Borderline diabetic   . Cancer [facility])    Breast cancer  . CHF (congestive heart failure) ([place]) 04/25/2017  . Dementia ([place]) 04/30/2017  . Diabetes mellitus ([place]) 04/25/2017  . DOE (dyspnea on exertion) 04/25/2017  . Essential hypertension 07/01/2007   Qualifier: Diagnosis of  By: Sherren Mocha MD, Jory Ee Glaucoma suspect   . Hypertension   . KNEE PAIN, LEFT 04/06/2010   Qualifier: Diagnosis of  By: Sherren Mocha MD, Jory Ee   . Nonischemic cardiomyopathy ([place])   . Obesity   . Obesity, unspecified 07/01/2007   Qualifier: Diagnosis of  By: Sherren Mocha MD, Jory Ee   . Peripheral vascular disease ([place])    blood clots in legs  . Thyroid disease  Past Surgical History:  Procedure Laterality Date  . CESAREAN SECTION    . MASTECTOMY  08/27/2017   LEFT BREAST MODIFIED RADICAL MASTECTOMY (Left Breast)  . MASTECTOMY  08/27/2017   RIGHT TOTAL MASTECTOMY WITH SENTINEL LYMPH NODE BIOPSY (Right Breast)  . MASTECTOMY W/ SENTINEL NODE BIOPSY Right 08/27/2017   Procedure: RIGHT TOTAL MASTECTOMY WITH SENTINEL LYMPH NODE BIOPSY;  Surgeon: Fanny Skates, MD;  Location: [place];  Service: General;  Laterality: Right;  . MODIFIED MASTECTOMY Left 08/27/2017   Procedure: LEFT BREAST MODIFIED RADICAL MASTECTOMY;  Surgeon: Fanny Skates, MD;  Location: [place];  Service: General;  Laterality:  Left;  . PORTACATH PLACEMENT Right 10/09/2017  . PORTACATH PLACEMENT Right 10/09/2017   Procedure: INSERTION PORT-A-CATH WITH ULTRASOUND;  Surgeon: Fanny Skates, MD;  Location: [place];  Service: General;  Laterality: Right;   Social History:   reports that she has never smoked. She has never used smokeless tobacco. She reports that she does not drink alcohol or use drugs.  Family History  Problem Relation Age of Onset  . Alzheimer's disease Mother 75  . Alzheimer's disease Father 21  . Congestive Heart Failure Father   . Alcoholism Brother   . Alzheimer's disease Brother   . Dementia Brother   . Alcoholism Brother   . Diabetes Brother     Medications: Patient's Medications  New Prescriptions   No medications on file  Previous Medications   CARVEDILOL (COREG) 12.5 MG TABLET    TAKE 1 TABLET (12.5 MG TOTAL) BY MOUTH 2 (TWO) TIMES DAILY WITH A MEAL.   DONEPEZIL (ARICEPT) 10 MG TABLET    Take 1 tablet (10 mg total) by mouth at bedtime.   ELIQUIS 5 MG TABS TABLET    TAKE 1 TABLET BY MOUTH TWICE A DAY   FUROSEMIDE (LASIX) 80 MG TABLET    TAKE 1 TABLET BY MOUTH EVERY DAY   KLOR-CON M20 20 MEQ TABLET    TAKE 1 TABLET BY MOUTH EVERY DAY   LETROZOLE (FEMARA) 2.5 MG TABLET    Take 1 tablet (2.5 mg total) by mouth daily.   METFORMIN (GLUCOPHAGE-XR) 500 MG 24 HR TABLET    TAKE 1 TABLET (500 MG TOTAL) BY MOUTH 2 (TWO) TIMES DAILY WITH A MEAL.   MULTIPLE VITAMINS-MINERALS (ADULT GUMMY PO)    Take 1 tablet by mouth 3 (three) times a week.   SACUBITRIL-VALSARTAN (ENTRESTO) 24-26 MG    Take 1 tablet by mouth 2 (two) times daily.  Modified Medications   No medications on file  Discontinued Medications   FUROSEMIDE (LASIX) 80 MG TABLET    TAKE 1 TABLET BY MOUTH  DAILY     Physical Exam:  Vitals:   07/20/18 0839  BP: 136/64  Pulse: 60  Temp: 98 F (36.7 C)  TempSrc: Oral  SpO2: 98%  Weight: 167 lb 9.6 oz (76 kg)  Height: _0  (1.676 m)   Body mass index is 27.05 kg/m.  Physical  Exam  Constitutional: She is oriented to person, place, and time. She appears well-developed and well-nourished.  HENT:  Mouth/Throat: Oropharynx is clear and moist. No oropharyngeal exudate.  MMM; no oral thrush  Eyes: Pupils are equal, round, and reactive to light. No scleral icterus.  Neck: Neck supple. Carotid bruit is not present. No tracheal deviation present. No thyromegaly present.  Cardiovascular: Normal rate, regular rhythm and intact distal pulses. Exam reveals no gallop and no friction rub.  Murmur heard.  Crescendo systolic (radiating to carotid b/l) murmur  is present.  Decrescendo diastolic murmur is present with a grade of 1/6. Pulmonary/Chest: Effort normal and breath sounds normal. No stridor. No respiratory distress. She has no wheezes. She has no rales.  Abdominal: Soft. Normal appearance and bowel sounds are normal. She exhibits no distension and no mass. There is no hepatomegaly. There is no tenderness. There is no rigidity, no rebound and no guarding. No hernia.  Musculoskeletal: She exhibits edema and deformity (LLE).  Lymphadenopathy:    She has no cervical adenopathy.  Neurological: She is alert and oriented to person, place, and time. She has normal reflexes.  Skin: Skin is warm and dry. No rash noted.  Psychiatric: She has a normal mood and affect. Her behavior is normal. Thought content normal.    Labs reviewed: Basic Metabolic Panel: Recent Labs    03/31/18 1017 05/12/18 0958 06/23/18 1026  NA 143 144 142  K 4.3 3.4* 4.2  CL 107 107 108  CO2 _0 GLUCOSE 179* 208* 213*  BUN _1 CREATININE 0.78 0.77 0.83  CALCIUM 9.1 9.1 9.2   Liver Function Tests: Recent Labs    03/31/18 1017 05/12/18 0958 06/23/18 1026  AST 11* 10* 10*  ALT _2 ALKPHOS 77 83 77  BILITOT 0.6 0.5 0.5  PROT 6.5 6.4* 6.5  ALBUMIN 3.8 3.5 3.7   No results for input(s): LIPASE, AMYLASE in the last 8760 hours. No results for input(s): AMMONIA in the last 8760  hours. CBC: Recent Labs    03/31/18 1017 05/12/18 0958 06/23/18 1026  WBC 4.1 5.5 3.9*  NEUTROABS 3.3 4.6 3.0  HGB 13.1 12.3 12.3  HCT 38.4 36.5 37.1  MCV 94.7 95.0 98.4  PLT 146 158 153   Lipid Panel: Recent Labs    03/17/18 0807 07/15/18 0840  CHOL 157 148  HDL 58 52  LDLCALC 79 75  TRIG 116 131  CHOLHDL 2.7 2.8   TSH: No results for input(s): TSH in the last 8760 hours. A1C: Lab Results  Component Value Date   HGBA1C 6.4 (H) 07/15/2018     Assessment/Plan 1. Type 2 diabetes mellitus with complication, without long-term current use of insulin (HCC) A1c at goal. Continues on metformin with dietary modifications.   2. Dementia without behavioral disturbance, unspecified dementia type ([place]) -slow progression of disease noted. Tolerating aricept well. Will add namenda via Namzaric titration.  - Memantine HCl-Donepezil HCl (NAMZARIC) 28-10 MG CP24; Take 1 capsule by mouth daily.  Dispense: 30 capsule; Refill: 2  3. Chronic systolic congestive heart failure (HCC) Stable, continues with cardiology follow up. Continues on lasix 80 mg daily with potassium, entresto, and coreg  4. Chronic deep vein thrombosis (DVT) of proximal vein of left lower extremity (HCC) Stable, continues on eliquis.   5. Bilateral malignant neoplasm of breast in female, unspecified estrogen receptor status, unspecified site of breast ([place]) Continues on femara and follow up with oncology  6. Essential hypertension -stable on current regimen.   Next appt: 11/20/2018, sooner if needed Carlos American. Hoffman, [facility] [phone]

## 2018-07-20 NOTE — Patient Instructions (Signed)
To start Namzaric titration pack for memory loss Will send prescription to [facility] (it is in the [place])

## 2018-07-31 ENCOUNTER — Other Ambulatory Visit (HOSPITAL_COMMUNITY): Payer: Self-pay | Admitting: Cardiology

## 2018-08-02 NOTE — Assessment & Plan Note (Signed)
in female, estrogen receptor positive ([place]) Right simple mastectomy: IDC grade 1, 1.1 cm, DCIS low-grade, margins negative, 0/2 lymph nodes negative; ER 95%, PR 95%, HER-2 negative Ki-67 15%T1 cN0 stage I a left simple mastectomy: IDC grade 3, 6.2 cm, 4/13 lymph nodes positive, margins negative, ER 5 %, PR 0%, HER-2 positive ratio 2.04, Ki-67 50 % T3 N2 stage IIIa Echocardiogram 09/24/2017: EF 50%  Recommendation: 1. Adjuvant Herceptinstarted 10/14/17 2. Adjuvant radiation therapy started 10/23/17-11/25/17 3. adjuvant antiestrogen therapystarted February 2019 --------------------------------------------------  Herceptin Toxicities: Tolerating it well Monitoring closely for cardiac ejection fraction 03/02/2018 EF 45% follows with Dr. Marigene Ehlers. September 2019 echocardiogram revealed EF 50%.   Cardiac MRI 05/29/2018: EF 45%, diffuse hypokinesis  Letrozoletoxicities:She is tolerating it extremely well. Denies any hot flashes or arthralgias or myalgias.  Return to clinic every 3 weeks for Herceptin every 6 weeks for follow-up with me.She will finish Herceptin in 09/15/2018

## 2018-08-03 ENCOUNTER — Inpatient Hospital Stay: Payer: Medicare Other

## 2018-08-03 ENCOUNTER — Inpatient Hospital Stay (HOSPITAL_BASED_OUTPATIENT_CLINIC_OR_DEPARTMENT_OTHER): Payer: Medicare Other | Admitting: Hematology and Oncology

## 2018-08-03 ENCOUNTER — Inpatient Hospital Stay: Payer: Medicare Other | Attending: Hematology and Oncology

## 2018-08-03 DIAGNOSIS — C50812 Malignant neoplasm of overlapping sites of left female breast: Secondary | ICD-10-CM | POA: Insufficient documentation

## 2018-08-03 DIAGNOSIS — R197 Diarrhea, unspecified: Secondary | ICD-10-CM

## 2018-08-03 DIAGNOSIS — Z5112 Encounter for antineoplastic immunotherapy: Secondary | ICD-10-CM | POA: Insufficient documentation

## 2018-08-03 DIAGNOSIS — C50811 Malignant neoplasm of overlapping sites of right female breast: Secondary | ICD-10-CM

## 2018-08-03 DIAGNOSIS — Z9013 Acquired absence of bilateral breasts and nipples: Secondary | ICD-10-CM

## 2018-08-03 DIAGNOSIS — C773 Secondary and unspecified malignant neoplasm of axilla and upper limb lymph nodes: Secondary | ICD-10-CM | POA: Diagnosis not present

## 2018-08-03 DIAGNOSIS — Z170 Estrogen receptor positive status [ER+]: Secondary | ICD-10-CM | POA: Diagnosis not present

## 2018-08-03 DIAGNOSIS — Z95828 Presence of other vascular implants and grafts: Secondary | ICD-10-CM

## 2018-08-03 LAB — CMP (CANCER CENTER ONLY)
ALBUMIN: 3.7 g/dL (ref 3.5–5.0)
ALT: 10 U/L (ref 0–44)
ANION GAP: 14 (ref 5–15)
AST: 9 U/L — ABNORMAL LOW (ref 15–41)
Alkaline Phosphatase: 72 U/L (ref 38–126)
BILIRUBIN TOTAL: 0.4 mg/dL (ref 0.3–1.2)
BUN: 19 mg/dL (ref 8–23)
CO2: 22 mmol/L (ref 22–32)
Calcium: 9.1 mg/dL (ref 8.9–10.3)
Chloride: 108 mmol/L (ref 98–111)
Creatinine: 0.8 mg/dL (ref 0.44–1.00)
GFR, Est AFR Am: >60 mL/min (ref >60)
GFR, Estimated: >60 mL/min (ref >60)
GLUCOSE: 189 mg/dL — AB (ref 70–99)
POTASSIUM: 3.8 mmol/L (ref 3.5–5.1)
Sodium: 144 mmol/L (ref 135–145)
TOTAL PROTEIN: 6.5 g/dL (ref 6.5–8.1)

## 2018-08-03 LAB — CBC WITH DIFFERENTIAL (CANCER CENTER ONLY)
ABS IMMATURE GRANULOCYTES: 0 10*3/uL (ref 0.00–0.07)
BASOS PCT: 1 %
Basophils Absolute: 0 10*3/uL (ref 0.0–0.1)
Eosinophils Absolute: 0.2 10*3/uL (ref 0.0–0.5)
Eosinophils Relative: 6 %
HCT: 37.4 % (ref 36.0–46.0)
Hemoglobin: 12.4 g/dL (ref 12.0–15.0)
IMMATURE GRANULOCYTES: 0 %
LYMPHS PCT: 11 %
Lymphs Abs: 0.4 10*3/uL — ABNORMAL LOW (ref 0.7–4.0)
MCH: 32.2 pg (ref 26.0–34.0)
MCHC: 33.2 g/dL (ref 30.0–36.0)
MCV: 97.1 fL (ref 80.0–100.0)
MONOS PCT: 10 %
Monocytes Absolute: 0.4 10*3/uL (ref 0.1–1.0)
NEUTROS ABS: 2.9 10*3/uL (ref 1.7–7.7)
NEUTROS PCT: 72 %
PLATELETS: 166 10*3/uL (ref 150–400)
RBC: 3.85 MIL/uL — AB (ref 3.87–5.11)
RDW: 12.9 % (ref 11.5–15.5)
WBC: 4 10*3/uL (ref 4.0–10.5)
nRBC: 0 % (ref 0.0–0.2)

## 2018-08-03 MED ORDER — HEPARIN SOD (PORK) LOCK FLUSH 100 UNIT/ML IV SOLN
500.0000 [IU] | Freq: Once | INTRAVENOUS | Status: AC | PRN
  Administered 2018-08-03: 500 [IU]
  Filled 2018-08-03: qty 5

## 2018-08-03 MED ORDER — SODIUM CHLORIDE 0.9% FLUSH
10.0000 mL | INTRAVENOUS | Status: DC | PRN
  Administered 2018-08-03: 10 mL
  Filled 2018-08-03: qty 10

## 2018-08-03 MED ORDER — DIPHENHYDRAMINE HCL 25 MG PO CAPS
ORAL_CAPSULE | ORAL | Status: AC
  Filled 2018-08-03: qty 2

## 2018-08-03 MED ORDER — SODIUM CHLORIDE 0.9 % IV SOLN
Freq: Once | INTRAVENOUS | Status: DC
  Filled 2018-08-03: qty 250

## 2018-08-03 MED ORDER — DIPHENHYDRAMINE HCL 25 MG PO CAPS
50.0000 mg | ORAL_CAPSULE | Freq: Once | ORAL | Status: AC
  Administered 2018-08-03: 50 mg via ORAL

## 2018-08-03 MED ORDER — ACETAMINOPHEN 325 MG PO TABS
650.0000 mg | ORAL_TABLET | Freq: Once | ORAL | Status: AC
  Administered 2018-08-03: 650 mg via ORAL

## 2018-08-03 MED ORDER — ACETAMINOPHEN 325 MG PO TABS
ORAL_TABLET | ORAL | Status: AC
  Filled 2018-08-03: qty 2

## 2018-08-03 MED ORDER — TRASTUZUMAB CHEMO 150 MG IV SOLR
450.0000 mg | Freq: Once | INTRAVENOUS | Status: AC
  Administered 2018-08-03: 450 mg via INTRAVENOUS
  Filled 2018-08-03: qty 21.43

## 2018-08-03 NOTE — Progress Notes (Signed)
Patient Care Team: Lauree Chandler, NP as PCP - General (Geriatric Medicine)  DIAGNOSIS:  Encounter Diagnosis  Name Primary?  . 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, estrogen receptor positive ([place])     SUMMARY OF ONCOLOGIC HISTORY:   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, estrogen receptor positive ([place])   06/23/2017 Initial Diagnosis    Left breast 11 o'clock position 6.3 x 4.2 x 6.1 cm mass with thick papillary projections, 2 adjacent left axillary lymph nodes; right breast 930 position 1.7 cm mass; left breast and left lymph node biopsy revealed IDC grade 3, ER 5%, PR 0%, Ki-67 50%, HER-2 positive ratio 2.04 T3N1 stage IIIa; right breast: Grade 1 IDC  ER 95%, PR 95%, Ki-67 15%, HER-2 negative; T1CN0 stage I a    08/27/2017 Surgery    Right simple mastectomy: IDC grade 1, 1.1 cm, DCIS low-grade, margins negative, 0/2 lymph nodes negative; ER 95%, PR 95%, HER-2 negative Ki-67 15% T1 cN0 stage I a left simple mastectomy: IDC grade 3, 6.2 cm, 4/13 lymph nodes positive, margins negative, ER 5 %, PR 0%, HER-2 positive ratio 2.04, Ki-67 50 % T3 N2 stage IIIa     10/14/2017 -  Chemotherapy    Adjuvant Herceptin alone for 1 year     10/23/2017 - 11/25/2017 Radiation Therapy    Adjuvant radiation therapy    12/05/2017 Genetic Testing    The Common Hereditary Cancer Panel offered by [facility] includes sequencing and/or deletion duplication testing of the following 47 genes: APC, ATM, AXIN2, BARD1, BMPR1A, BRCA1, BRCA2, BRIP1, CDH1, CDKN2A (p14ARF), CDKN2A (p16INK4a), CKD4, CHEK2, CTNNA1, DICER1, EPCAM (Deletion/duplication testing only), GREM1 (promoter region deletion/duplication testing only), KIT, MEN1, MLH1, MSH2, MSH3, MSH6, MUTYH, NBN, NF1, NHTL1, PALB2, PDGFRA, PMS2, POLD1, POLE, PTEN, RAD50, RAD51C, RAD51D, SDHB, SDHC, SDHD, SMAD4, SMARCA4. STK11, TP53, TSC1, TSC2, and VHL.  The following genes were evaluated for sequence changes only:  SDHA and HOXB13 c.251G>A variant only.  Results: Negative, no pathogenic variants identified.  The date of this test report is 12/05/2017.      CHIEF COMPLIANT: Follow-up on Herceptin  INTERVAL HISTORY: Charlene Rubio is a 72 year old with above-mentioned history of HER-2 positive breast cancer currently on adjuvant anti-HER-2 therapy with Herceptin.  She is tolerating it fairly well.  She has intermittent diarrhea 2-3 times per week.  It is unclear if it is related to Herceptin.  Otherwise no problems or concerns.  REVIEW OF SYSTEMS:   Constitutional: Denies fevers, chills or abnormal weight loss Eyes: Denies blurriness of vision Ears, nose, mouth, throat, and face: Denies mucositis or sore throat Respiratory: Denies cough, dyspnea or wheezes Cardiovascular: Denies palpitation, chest discomfort Gastrointestinal:  Denies nausea, heartburn or change in bowel habits Skin: Denies abnormal skin rashes Lymphatics: Denies new lymphadenopathy or easy bruising Neurological:Denies numbness, tingling or new weaknesses Behavioral/Psych: Mood is stable, no new changes  Extremities: No lower extremity edema Breast:  denies any pain or lumps or nodules in either breasts All other systems were reviewed with the patient and are negative.  I have reviewed the past medical history, past surgical history, social history and family history with the patient and they are unchanged from previous note.  ALLERGIES:  has No Known Allergies.  MEDICATIONS:  Current Outpatient Medications  Medication Sig Dispense Refill  . carvedilol (COREG) 12.5 MG tablet TAKE 1 TABLET (12.5 MG TOTAL) BY MOUTH 2 (TWO) TIMES DAILY WITH A MEAL. 180 tablet 3  .  donepezil (ARICEPT) 10 MG tablet Take 1 tablet (10 mg total) by mouth at bedtime. 90 tablet 1  . ELIQUIS 5 MG TABS tablet TAKE 1 TABLET BY MOUTH TWICE A DAY 180 tablet 1  . ENTRESTO 24-26 MG TAKE 1 TABLET BY MOUTH TWICE A DAY 60 tablet 3  . furosemide (LASIX) 80 MG tablet  TAKE 1 TABLET BY MOUTH EVERY DAY 90 tablet 1  . KLOR-CON M20 20 MEQ tablet TAKE 1 TABLET BY MOUTH EVERY DAY 90 tablet 1  . letrozole (FEMARA) 2.5 MG tablet Take 1 tablet (2.5 mg total) by mouth daily. 90 tablet 3  . Memantine HCl-Donepezil HCl (NAMZARIC) 28-10 MG CP24 Take 1 capsule by mouth daily. 30 capsule 2  . metFORMIN (GLUCOPHAGE-XR) 500 MG 24 hr tablet TAKE 1 TABLET (500 MG TOTAL) BY MOUTH 2 (TWO) TIMES DAILY WITH A MEAL. 180 tablet 1  . Multiple Vitamins-Minerals (ADULT GUMMY PO) Take 1 tablet by mouth 3 (three) times a week.     No current facility-administered medications for this visit.    Facility-Administered Medications Ordered in Other Visits  Medication Dose Route Frequency Provider Last Rate Last Dose  . sodium chloride flush (NS) 0.9 % injection 10 mL  10 mL Intracatheter PRN Nicholas Lose, MD   10 mL at 08/03/18 0920    PHYSICAL EXAMINATION: ECOG PERFORMANCE STATUS: 1 - Symptomatic but completely ambulatory  Vitals:   08/03/18 1001  BP: (!) 148/51  Pulse: 60  Resp: 18  Temp: 98.2 F (36.8 C)  SpO2: 99%   Filed Weights   08/03/18 1001  Weight: 165 lb (74.8 kg)    GENERAL:alert, no distress and comfortable SKIN: skin color, texture, turgor are normal, no rashes or significant lesions EYES: normal, Conjunctiva are pink and non-injected, sclera clear OROPHARYNX:no exudate, no erythema and lips, buccal mucosa, and tongue normal  NECK: supple, thyroid normal size, non-tender, without nodularity LYMPH:  no palpable lymphadenopathy in the cervical, axillary or inguinal LUNGS: clear to auscultation and percussion with normal breathing effort HEART: regular rate & rhythm and no murmurs and no lower extremity edema ABDOMEN:abdomen soft, non-tender and normal bowel sounds MUSCULOSKELETAL:no cyanosis of digits and no clubbing  NEURO: alert & oriented x 3 with fluent speech, no focal motor/sensory deficits EXTREMITIES: No lower extremity edema   LABORATORY DATA:  I  have reviewed the data as listed CMP Latest Ref Rng & Units 06/23/2018 05/12/2018 03/31/2018  Glucose 70 - 99 mg/dL 213(H) 208(H) 179(H)  BUN 8 - 23 mg/dL _0 Creatinine 0.44 - 1.00 mg/dL 0.83 0.77 0.78  Sodium 135 - 145 mmol/L 142 144 143  Potassium 3.5 - 5.1 mmol/L 4.2 3.4(L) 4.3  Chloride 98 - 111 mmol/L 108 107 107  CO2 22 - 32 mmol/L _1 Calcium 8.9 - 10.3 mg/dL 9.2 9.1 9.1  Total Protein 6.5 - 8.1 g/dL 6.5 6.4(L) 6.5  Total Bilirubin 0.3 - 1.2 mg/dL 0.5 0.5 0.6  Alkaline Phos 38 - 126 U/L 77 83 77  AST 15 - 41 U/L 10(L) 10(L) 11(L)  ALT 0 - 44 U/L _2 Lab Results  Component Value Date   WBC 4.0 08/03/2018   HGB 12.4 08/03/2018   HCT 37.4 08/03/2018   MCV 97.1 08/03/2018   PLT 166 08/03/2018   NEUTROABS 2.9 08/03/2018    ASSESSMENT & PLAN:  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, estrogen receptor positive ([place]) in female, estrogen receptor positive (  [place]) Right simple mastectomy: IDC grade 1, 1.1 cm, DCIS low-grade, margins negative, 0/2 lymph nodes negative; ER 95%, PR 95%, HER-2 negative Ki-67 15%T1 cN0 stage I a left simple mastectomy: IDC grade 3, 6.2 cm, 4/13 lymph nodes positive, margins negative, ER 5 %, PR 0%, HER-2 positive ratio 2.04, Ki-67 50 % T3 N2 stage IIIa Echocardiogram 09/24/2017: EF 50%  Recommendation: 1. Adjuvant Herceptinstarted 10/14/17 2. Adjuvant radiation therapy started 10/23/17-11/25/17 3. adjuvant antiestrogen therapystarted February 2019 --------------------------------------------------  Herceptin Toxicities: Tolerating it well Monitoring closely for cardiac ejection fraction 03/02/2018 EF 45% follows with Dr. Marigene Ehlers. September 2019 echocardiogram revealed EF 50%.   Cardiac MRI 05/29/2018: EF 45%, diffuse hypokinesis 05/08/2018: Echocardiogram EF 50%  Letrozoletoxicities:She is tolerating it extremely well. Denies any hot flashes or arthralgias or myalgias.  Return to clinic every 3 weeks for  Herceptin every 6 weeks for follow-up with me.She will finish Herceptin in 09/15/2018    No orders of the defined types were placed in this encounter.  The patient has a good understanding of the overall plan. she agrees with it. she will call with any problems that may develop before the next visit here.   Harriette Ohara, MD 08/03/18

## 2018-08-03 NOTE — Patient Instructions (Signed)
[facility] Discharge Instructions for Patients Receiving Chemotherapy  Today you received the following chemotherapy agents: Trastuzumab (Herceptin).  To help prevent nausea and vomiting after your treatment, we encourage you to take your nausea medication as prescribed. If you develop nausea and vomiting that is not controlled by your nausea medication, call the clinic.   BELOW ARE SYMPTOMS THAT SHOULD BE REPORTED IMMEDIATELY:  *FEVER GREATER THAN 100.5 F  *CHILLS WITH OR WITHOUT FEVER  NAUSEA AND VOMITING THAT IS NOT CONTROLLED WITH YOUR NAUSEA MEDICATION  *UNUSUAL SHORTNESS OF BREATH  *UNUSUAL BRUISING OR BLEEDING  TENDERNESS IN MOUTH AND THROAT WITH OR WITHOUT PRESENCE OF ULCERS  *URINARY PROBLEMS  *BOWEL PROBLEMS  UNUSUAL RASH Items with * indicate a potential emergency and should be followed up as soon as possible.  Feel free to call the clinic should you have any questions or concerns. The clinic phone number is [phone].  Please show the CHEMO ALERT CARD at check-in to the Emergency Department and triage nurse.   

## 2018-08-03 NOTE — Patient Instructions (Signed)

## 2018-08-04 ENCOUNTER — Telehealth: Payer: Self-pay | Admitting: Hematology and Oncology

## 2018-08-04 NOTE — Telephone Encounter (Signed)
No 12/16 los.

## 2018-08-05 ENCOUNTER — Ambulatory Visit (HOSPITAL_COMMUNITY)
Admission: RE | Admit: 2018-08-05 | Discharge: 2018-08-05 | Disposition: A | Discharge status: Home / Self Care | Payer: Medicare Other | Source: Ambulatory Visit | Attending: Cardiology | Admitting: Cardiology

## 2018-08-05 ENCOUNTER — Encounter (HOSPITAL_COMMUNITY): Payer: Self-pay | Admitting: Cardiology

## 2018-08-05 VITALS — BP 140/78 | HR 74 | Wt 168.0 lb

## 2018-08-05 DIAGNOSIS — Z7984 Long term (current) use of oral hypoglycemic drugs: Secondary | ICD-10-CM | POA: Diagnosis not present

## 2018-08-05 DIAGNOSIS — I5022 Chronic systolic (congestive) heart failure: Secondary | ICD-10-CM | POA: Insufficient documentation

## 2018-08-05 DIAGNOSIS — I872 Venous insufficiency (chronic) (peripheral): Secondary | ICD-10-CM | POA: Diagnosis not present

## 2018-08-05 DIAGNOSIS — F0390 Unspecified dementia without behavioral disturbance: Secondary | ICD-10-CM | POA: Diagnosis not present

## 2018-08-05 DIAGNOSIS — I82502 Chronic embolism and thrombosis of unspecified deep veins of left lower extremity: Secondary | ICD-10-CM | POA: Diagnosis not present

## 2018-08-05 DIAGNOSIS — Z9013 Acquired absence of bilateral breasts and nipples: Secondary | ICD-10-CM | POA: Diagnosis not present

## 2018-08-05 DIAGNOSIS — Z853 Personal history of malignant neoplasm of breast: Secondary | ICD-10-CM | POA: Insufficient documentation

## 2018-08-05 DIAGNOSIS — I5042 Chronic combined systolic (congestive) and diastolic (congestive) heart failure: Secondary | ICD-10-CM | POA: Diagnosis not present

## 2018-08-05 DIAGNOSIS — Z7901 Long term (current) use of anticoagulants: Secondary | ICD-10-CM | POA: Diagnosis not present

## 2018-08-05 DIAGNOSIS — I110 Hypertensive heart disease with heart failure: Secondary | ICD-10-CM | POA: Diagnosis not present

## 2018-08-05 DIAGNOSIS — E119 Type 2 diabetes mellitus without complications: Secondary | ICD-10-CM | POA: Insufficient documentation

## 2018-08-05 DIAGNOSIS — Z79899 Other long term (current) drug therapy: Secondary | ICD-10-CM | POA: Diagnosis not present

## 2018-08-05 DIAGNOSIS — I351 Nonrheumatic aortic (valve) insufficiency: Secondary | ICD-10-CM | POA: Insufficient documentation

## 2018-08-05 DIAGNOSIS — Z8249 Family history of ischemic heart disease and other diseases of the circulatory system: Secondary | ICD-10-CM | POA: Insufficient documentation

## 2018-08-05 MED ORDER — SPIRONOLACTONE 25 MG PO TABS: 12.5000 mg | ORAL_TABLET | Freq: Every day | ORAL | 3 refills | Status: DC

## 2018-08-05 NOTE — Patient Instructions (Signed)
Labs will need to be drawn in 2 weeks  START Spironolactone 12.5mg  (0.5 tab) daily  Your physician has requested that you have an echocardiogram. Echocardiography is a painless test that uses sound waves to create images of your heart. It provides your doctor with information about the size and shape of your heart and how well your heart's chambers and valves are working. This procedure takes approximately one hour. There are no restrictions for this procedure.  Follow up with Dr. Aundra Dubin in 6 months

## 2018-08-06 NOTE — Progress Notes (Signed)
PCP: [name] Oncology: [name] Cardiology: Dr. Claiborne Billings HF Cardiology: Dr. Aundra Dubin  72 yo with history of breast cancer, chronic systolic CHF, chronic left leg DVT, and dementia was referred by [name] for cardio-oncology evaluation given Herceptin use.   Patient went a number of years without accessing medical care.  However, in 9/18, she was admitted with dyspnea and was found to have a CHF exacerbation.  Echo showed EF 25-30%.  She was diuresed.  Cardiolite in 10/18 showed EF 41% with no ischemia.  Blood glucose was also noted to be very high this admission.  She was then diagnosed with breast cancer and had bilateral mastectomies in 1/19.  ER+/PR+/HER2+. Planned for Herceptin starting 2/19 to continue until 1/20.  Radiation 3/19-4/19.   She had an echo in 2/19 suggesting improved EF, 50-55%.  However, repeat echo in 5/19 showed EF 45% with mild AS and moderate AI, normal RV size and systolic function.  Echo in 9/19 showed EF 50% with mild LVH, normal RV size and systolic function, mild AS with mild-moderate aortic insufficiency. She had cardiac MRI in 10/19, showing EF 45% with moderate LV dilation, normal RV size with EF 43%, moderate AI, no LGE.    Most of history comes from her husband.  She is interactive but has a poor memory. Very little activity at baseline per husband. She has chronic problems with her legs and walks little. No exertional dyspnea.  No chest pain.  No lightheadedness or falls.   Labs (5/19): K 3.4, creatinine 0.74, hgb 12.4 Labs (7/19): K 3.4, creatinine 0.81, LDL 79 Labs (8/19): K 4.3, creatinine 0.78 Labs (12/19): K 3.8, creatinine 0.8, LDL 75  PMH: 1. Breast cancer: Bilateral mastectomies in 1/19.  ER+/PR+/HER2+. Planned for Herceptin starting 2/19 to continue for a year.  Radiation 3/19-4/19.  2. Type II diabetes 3. Chronic DVT left leg: Found in 9/18.  4. Dementia 5. HTN 6. Chronic systolic CHF: ?Etiology.  - Echo (9/18): EF 25-30% - Cardiolite  (10/18): EF 41%, no ischemia.  - Echo (2/19): EF 50-55%, mild LV dilation, mild LVH, mild AS, mild-moderate AI.  - Echo (5/19): EF 45%, diffuse hypokinesis, normal RV size and systolic function, mild AS with moderate AI.  - Echo (7/19): EF 45%, mild LV dilation, diffuse hypokinesis, normal RV, moderate AI/mild AS.  - Echo (9/19): EF 50%, mild LVH, diffuse hypokinesis, mild AS and mild-moderate aortic insufficiency.  - Cardiac MRI (10/19): EF 45%, diffuse hypokinesis, moderate LV dilation, normal RV size with EF 43%, moderate AI, no LGE.   FH: Father with CHF, dementia.  Brother with CAD s/p PCI.   Social History   Socioeconomic History  . Marital status: Married    Spouse name: Not on file  . Number of children: Not on file  . Years of education: Not on file  . Highest education level: Not on file  Occupational History  . Occupation: retired Engineer, structural  . Financial resource strain: Not hard at all  . Food insecurity:    Worry: Never true    Inability: Never true  . Transportation needs:    Medical: No    Non-medical: No  Tobacco Use  . Smoking status: Never Smoker  . Smokeless tobacco: Never Used  Substance and Sexual Activity  . Alcohol use: No  . Drug use: No  . Sexual activity: Not Currently  Lifestyle  . Physical activity:    Days per week: 0 days    Minutes per session:  0 min  . Stress: Only a little  Relationships  . Social connections:    Talks on phone: More than three times a week    Gets together: More than three times a week    Attends religious service: Never    Active member of club or organization: No    Attends meetings of clubs or organizations: Never    Relationship status: Married  . Intimate partner violence:    Fear of current or ex partner: No    Emotionally abused: No    Physically abused: No    Forced sexual activity: No  Other Topics Concern  . Not on file  Social History Narrative   Social History   Diet?    Do you  drink/eat things with caffeine? Yes (minimal) coke   Marital status?          married                          What year were you married? 1968   Do you live in a house, apartment, assisted living, condo, trailer, etc.? house   Is it one or more stories? one   How many persons live in your home? 2   Do you have any pets in your home? (please list) none   Highest level of education completed?   Current or past profession: preschool teacher/nanny   Do you exercise?         0                             Type & how often?   Advanced Directives   Do you have a living will?   Do you have a DNR form?                                  If not, do you want to discuss one?   Do you have signed POA/HPOA for forms?    Functional Status   Do you have difficulty bathing or dressing yourself?   Do you have difficulty preparing food or eating?    Do you have difficulty managing your medications?   Do you have difficulty managing your finances?   Do you have difficulty affording your medications?    Admitted to [facility] 05/01/17   Never smoked   Alcohol none   DNR      ROS: All systems reviewed and negative except as per HPI.  Current Outpatient Medications  Medication Sig Dispense Refill  . carvedilol (COREG) 12.5 MG tablet TAKE 1 TABLET (12.5 MG TOTAL) BY MOUTH 2 (TWO) TIMES DAILY WITH A MEAL. 180 tablet 3  . donepezil (ARICEPT) 10 MG tablet Take 1 tablet (10 mg total) by mouth at bedtime. 90 tablet 1  . ELIQUIS 5 MG TABS tablet TAKE 1 TABLET BY MOUTH TWICE A DAY 180 tablet 1  . ENTRESTO 24-26 MG TAKE 1 TABLET BY MOUTH TWICE A DAY 60 tablet 3  . furosemide (LASIX) 80 MG tablet TAKE 1 TABLET BY MOUTH EVERY DAY 90 tablet 1  . KLOR-CON M20 20 MEQ tablet TAKE 1 TABLET BY MOUTH EVERY DAY 90 tablet 1  . letrozole (FEMARA) 2.5 MG tablet Take 1 tablet (2.5 mg total) by mouth daily. 90 tablet 3  . Memantine HCl-Donepezil HCl (NAMZARIC) 28-10 MG CP24 Take  1 capsule by mouth daily. 30 capsule 2  .  metFORMIN (GLUCOPHAGE-XR) 500 MG 24 hr tablet TAKE 1 TABLET (500 MG TOTAL) BY MOUTH 2 (TWO) TIMES DAILY WITH A MEAL. 180 tablet 1  . Multiple Vitamins-Minerals (ADULT GUMMY PO) Take 1 tablet by mouth 3 (three) times a week.    . spironolactone (ALDACTONE) 25 MG tablet Take 0.5 tablets (12.5 mg total) by mouth daily. 90 tablet 3   No current facility-administered medications for this encounter.    BP 140/78   Pulse 74   Wt 76.2 kg (168 lb)   LMP  (LMP Unknown)   SpO2 98%   BMI 27.12 kg/m  General: NAD Neck: No JVD, no thyromegaly or thyroid nodule.  Lungs: Clear to auscultation bilaterally with normal respiratory effort. CV: Nondisplaced PMI.  Heart regular S1/S2, no S3/S4, 3/6 SEM RUSB.  1+ ankle edema.  No carotid bruit.  Normal pedal pulses.  Abdomen: Soft, nontender, no hepatosplenomegaly, no distention.  Skin: Intact without lesions or rashes.  Neurologic: Alert and oriented x 3. Poor memory. Psych: Normal affect. Extremities: No clubbing or cyanosis.  HEENT: Normal.   Assessment/Plan: 1. Chronic left leg DVT: She is on Eliquis.  With cancer diagnosis, will continue chronically.  She has venous insufficiency in the left leg likely related to prior DVT.  2. Chronic systolic CHF: Cardiomyopathy of uncertain etiology. First diagnosed in 9/18 with CHF exacerbation, EF by echo at the time was 25-30%.  Cardiolite in 10/18 showed no ischemia, EF 41%.  Echo in 2/19 was read as showing improved EF, 50-55%.  Echo in 5/19 showed EF about 45%.  Echo in 7/19 showed EF 45% with diffuse hypokinesis.  Echo in 9/19 showed EF 50%, some improvement.  Cardiac MRI in 10/19 showed EF 45%, no LGE, moderate AI.  She does not look volume overloaded on exam.  She will be finishing Herceptin in 1/20.  NYHA class II.  - Continue Lasix 80 mg daily.    - Continue Coreg 12.5 mg bid and Entresto 24/26 bid.  - Add spironolactone 12.5 daily with BMET in 2 wks.  3. Aortic valve disorder: Moderate aortic insufficiency  by echo and cardiac MRI.    Followup in 6 months with echo.    Loralie Champagne 08/06/2018

## 2018-08-20 ENCOUNTER — Ambulatory Visit (HOSPITAL_COMMUNITY)
Admission: RE | Admit: 2018-08-20 | Discharge: 2018-08-20 | Disposition: A | Discharge status: Home / Self Care | Payer: Medicare Other | Source: Ambulatory Visit | Attending: Cardiology | Admitting: Cardiology

## 2018-08-20 DIAGNOSIS — I5022 Chronic systolic (congestive) heart failure: Secondary | ICD-10-CM | POA: Diagnosis not present

## 2018-08-20 LAB — BASIC METABOLIC PANEL
Anion gap: 13 (ref 5–15)
BUN: 20 mg/dL (ref 8–23)
CO2: 19 mmol/L — ABNORMAL LOW (ref 22–32)
CREATININE: 0.79 mg/dL (ref 0.44–1.00)
Calcium: 9.4 mg/dL (ref 8.9–10.3)
Chloride: 109 mmol/L (ref 98–111)
GFR calc Af Amer: >60 mL/min (ref >60)
GFR calc non Af Amer: >60 mL/min (ref >60)
Glucose, Bld: 204 mg/dL — ABNORMAL HIGH (ref 70–99)
Potassium: 4.6 mmol/L (ref 3.5–5.1)
Sodium: 141 mmol/L (ref 135–145)

## 2018-08-24 ENCOUNTER — Inpatient Hospital Stay: Payer: Medicare Other | Attending: Hematology and Oncology

## 2018-08-24 VITALS — BP 153/46 | HR 61 | Temp 98.2°F | Resp 20

## 2018-08-24 DIAGNOSIS — Z170 Estrogen receptor positive status [ER+]: Secondary | ICD-10-CM

## 2018-08-24 DIAGNOSIS — C50811 Malignant neoplasm of overlapping sites of right female breast: Secondary | ICD-10-CM | POA: Diagnosis not present

## 2018-08-24 DIAGNOSIS — Z5112 Encounter for antineoplastic immunotherapy: Secondary | ICD-10-CM | POA: Diagnosis not present

## 2018-08-24 DIAGNOSIS — C50812 Malignant neoplasm of overlapping sites of left female breast: Secondary | ICD-10-CM | POA: Insufficient documentation

## 2018-08-24 DIAGNOSIS — Z79899 Other long term (current) drug therapy: Secondary | ICD-10-CM | POA: Insufficient documentation

## 2018-08-24 DIAGNOSIS — C773 Secondary and unspecified malignant neoplasm of axilla and upper limb lymph nodes: Secondary | ICD-10-CM | POA: Diagnosis not present

## 2018-08-24 MED ORDER — HEPARIN SOD (PORK) LOCK FLUSH 100 UNIT/ML IV SOLN
500.0000 [IU] | Freq: Once | INTRAVENOUS | Status: AC | PRN
  Administered 2018-08-24: 500 [IU]
  Filled 2018-08-24: qty 5

## 2018-08-24 MED ORDER — DIPHENHYDRAMINE HCL 25 MG PO CAPS
50.0000 mg | ORAL_CAPSULE | Freq: Once | ORAL | Status: AC
  Administered 2018-08-24: 50 mg via ORAL

## 2018-08-24 MED ORDER — ACETAMINOPHEN 325 MG PO TABS
650.0000 mg | ORAL_TABLET | Freq: Once | ORAL | Status: AC
  Administered 2018-08-24: 650 mg via ORAL

## 2018-08-24 MED ORDER — DIPHENHYDRAMINE HCL 25 MG PO CAPS
ORAL_CAPSULE | ORAL | Status: AC
  Filled 2018-08-24: qty 2

## 2018-08-24 MED ORDER — ACETAMINOPHEN 325 MG PO TABS
ORAL_TABLET | ORAL | Status: AC
  Filled 2018-08-24: qty 2

## 2018-08-24 MED ORDER — SODIUM CHLORIDE 0.9 % IV SOLN
Freq: Once | INTRAVENOUS | Status: AC
  Administered 2018-08-24: 10:00:00 via INTRAVENOUS
  Filled 2018-08-24: qty 250

## 2018-08-24 MED ORDER — TRASTUZUMAB CHEMO 150 MG IV SOLR
450.0000 mg | Freq: Once | INTRAVENOUS | Status: AC
  Administered 2018-08-24: 450 mg via INTRAVENOUS
  Filled 2018-08-24: qty 21.43

## 2018-08-24 MED ORDER — SODIUM CHLORIDE 0.9% FLUSH
10.0000 mL | INTRAVENOUS | Status: DC | PRN
  Administered 2018-08-24: 10 mL
  Filled 2018-08-24: qty 10

## 2018-08-24 NOTE — Patient Instructions (Signed)
[facility] Discharge Instructions for Patients Receiving Chemotherapy  Today you received the following chemotherapy agents: Trastuzumab (Herceptin).  To help prevent nausea and vomiting after your treatment, we encourage you to take your nausea medication as prescribed. If you develop nausea and vomiting that is not controlled by your nausea medication, call the clinic.   BELOW ARE SYMPTOMS THAT SHOULD BE REPORTED IMMEDIATELY:  *FEVER GREATER THAN 100.5 F  *CHILLS WITH OR WITHOUT FEVER  NAUSEA AND VOMITING THAT IS NOT CONTROLLED WITH YOUR NAUSEA MEDICATION  *UNUSUAL SHORTNESS OF BREATH  *UNUSUAL BRUISING OR BLEEDING  TENDERNESS IN MOUTH AND THROAT WITH OR WITHOUT PRESENCE OF ULCERS  *URINARY PROBLEMS  *BOWEL PROBLEMS  UNUSUAL RASH Items with * indicate a potential emergency and should be followed up as soon as possible.  Feel free to call the clinic should you have any questions or concerns. The clinic phone number is [phone].  Please show the CHEMO ALERT CARD at check-in to the Emergency Department and triage nurse.   

## 2018-09-02 ENCOUNTER — Ambulatory Visit (HOSPITAL_COMMUNITY)
Admission: RE | Admit: 2018-09-02 | Discharge: 2018-09-02 | Disposition: A | Discharge status: Home / Self Care | Payer: Medicare Other | Source: Ambulatory Visit | Attending: Cardiology | Admitting: Cardiology

## 2018-09-02 DIAGNOSIS — I083 Combined rheumatic disorders of mitral, aortic and tricuspid valves: Secondary | ICD-10-CM | POA: Insufficient documentation

## 2018-09-02 DIAGNOSIS — I5022 Chronic systolic (congestive) heart failure: Secondary | ICD-10-CM | POA: Diagnosis not present

## 2018-09-02 NOTE — Progress Notes (Signed)
  Echocardiogram 2D Echocardiogram has been performed.  Jannett Celestine 09/02/2018, 12:05 PM

## 2018-09-03 ENCOUNTER — Telehealth (HOSPITAL_COMMUNITY): Payer: Self-pay

## 2018-09-03 NOTE — Telephone Encounter (Signed)
Pt called no answer no voice mail set up

## 2018-09-07 ENCOUNTER — Other Ambulatory Visit (HOSPITAL_COMMUNITY): Payer: Self-pay

## 2018-09-07 DIAGNOSIS — I5022 Chronic systolic (congestive) heart failure: Secondary | ICD-10-CM

## 2018-09-11 ENCOUNTER — Encounter (HOSPITAL_COMMUNITY): Payer: Self-pay

## 2018-09-11 ENCOUNTER — Telehealth (HOSPITAL_COMMUNITY): Payer: Self-pay

## 2018-09-11 NOTE — Telephone Encounter (Signed)
Pt called no answer no voice mail set up

## 2018-09-14 ENCOUNTER — Inpatient Hospital Stay: Payer: Medicare Other

## 2018-09-14 ENCOUNTER — Inpatient Hospital Stay (HOSPITAL_BASED_OUTPATIENT_CLINIC_OR_DEPARTMENT_OTHER): Payer: Medicare Other | Admitting: Hematology and Oncology

## 2018-09-14 ENCOUNTER — Telehealth: Payer: Self-pay | Admitting: Adult Health

## 2018-09-14 DIAGNOSIS — C50812 Malignant neoplasm of overlapping sites of left female breast: Principal | ICD-10-CM

## 2018-09-14 DIAGNOSIS — C50811 Malignant neoplasm of overlapping sites of right female breast: Secondary | ICD-10-CM

## 2018-09-14 DIAGNOSIS — C773 Secondary and unspecified malignant neoplasm of axilla and upper limb lymph nodes: Secondary | ICD-10-CM | POA: Diagnosis not present

## 2018-09-14 DIAGNOSIS — Z170 Estrogen receptor positive status [ER+]: Secondary | ICD-10-CM

## 2018-09-14 DIAGNOSIS — Z79899 Other long term (current) drug therapy: Secondary | ICD-10-CM | POA: Diagnosis not present

## 2018-09-14 DIAGNOSIS — Z5112 Encounter for antineoplastic immunotherapy: Secondary | ICD-10-CM | POA: Diagnosis not present

## 2018-09-14 DIAGNOSIS — Z9013 Acquired absence of bilateral breasts and nipples: Secondary | ICD-10-CM

## 2018-09-14 LAB — CMP (CANCER CENTER ONLY)
ALT: 10 U/L (ref 0–44)
AST: 9 U/L — AB (ref 15–41)
Albumin: 3.7 g/dL (ref 3.5–5.0)
Alkaline Phosphatase: 67 U/L (ref 38–126)
Anion gap: 12 (ref 5–15)
BUN: 20 mg/dL (ref 8–23)
CO2: 23 mmol/L (ref 22–32)
Calcium: 9.1 mg/dL (ref 8.9–10.3)
Chloride: 108 mmol/L (ref 98–111)
Creatinine: 0.83 mg/dL (ref 0.44–1.00)
GFR, Est AFR Am: >60 mL/min (ref >60)
GFR, Estimated: >60 mL/min (ref >60)
Glucose, Bld: 257 mg/dL — ABNORMAL HIGH (ref 70–99)
Potassium: 3.8 mmol/L (ref 3.5–5.1)
Sodium: 143 mmol/L (ref 135–145)
Total Bilirubin: 0.5 mg/dL (ref 0.3–1.2)
Total Protein: 6.8 g/dL (ref 6.5–8.1)

## 2018-09-14 LAB — CBC WITH DIFFERENTIAL (CANCER CENTER ONLY)
Abs Immature Granulocytes: 0.03 10*3/uL (ref 0.00–0.07)
Basophils Absolute: 0.1 10*3/uL (ref 0.0–0.1)
Basophils Relative: 1 %
Eosinophils Absolute: 0.2 10*3/uL (ref 0.0–0.5)
Eosinophils Relative: 3 %
HEMATOCRIT: 37.8 % (ref 36.0–46.0)
HEMOGLOBIN: 12.7 g/dL (ref 12.0–15.0)
Immature Granulocytes: 0 %
LYMPHS PCT: 6 %
Lymphs Abs: 0.4 10*3/uL — ABNORMAL LOW (ref 0.7–4.0)
MCH: 32.6 pg (ref 26.0–34.0)
MCHC: 33.6 g/dL (ref 30.0–36.0)
MCV: 97.2 fL (ref 80.0–100.0)
MONO ABS: 0.3 10*3/uL (ref 0.1–1.0)
Monocytes Relative: 4 %
Neutro Abs: 6 10*3/uL (ref 1.7–7.7)
Neutrophils Relative %: 86 %
Platelet Count: 161 10*3/uL (ref 150–400)
RBC: 3.89 MIL/uL (ref 3.87–5.11)
RDW: 13 % (ref 11.5–15.5)
WBC Count: 6.9 10*3/uL (ref 4.0–10.5)
nRBC: 0 % (ref 0.0–0.2)

## 2018-09-14 MED ORDER — HEPARIN SOD (PORK) LOCK FLUSH 100 UNIT/ML IV SOLN
500.0000 [IU] | Freq: Once | INTRAVENOUS | Status: AC | PRN
  Administered 2018-09-14: 500 [IU]
  Filled 2018-09-14: qty 5

## 2018-09-14 MED ORDER — TRASTUZUMAB CHEMO 150 MG IV SOLR
450.0000 mg | Freq: Once | INTRAVENOUS | Status: AC
  Administered 2018-09-14: 450 mg via INTRAVENOUS
  Filled 2018-09-14: qty 21.43

## 2018-09-14 MED ORDER — ACETAMINOPHEN 325 MG PO TABS
ORAL_TABLET | ORAL | Status: AC
  Filled 2018-09-14: qty 2

## 2018-09-14 MED ORDER — DIPHENHYDRAMINE HCL 25 MG PO CAPS
50.0000 mg | ORAL_CAPSULE | Freq: Once | ORAL | Status: AC
  Administered 2018-09-14: 50 mg via ORAL

## 2018-09-14 MED ORDER — DIPHENHYDRAMINE HCL 25 MG PO CAPS
ORAL_CAPSULE | ORAL | Status: AC
  Filled 2018-09-14: qty 2

## 2018-09-14 MED ORDER — SODIUM CHLORIDE 0.9 % IV SOLN
Freq: Once | INTRAVENOUS | Status: AC
  Administered 2018-09-14: 10:00:00 via INTRAVENOUS
  Filled 2018-09-14: qty 250

## 2018-09-14 MED ORDER — SODIUM CHLORIDE 0.9% FLUSH
10.0000 mL | INTRAVENOUS | Status: DC | PRN
  Administered 2018-09-14: 10 mL
  Filled 2018-09-14: qty 10

## 2018-09-14 MED ORDER — ACETAMINOPHEN 325 MG PO TABS
650.0000 mg | ORAL_TABLET | Freq: Once | ORAL | Status: AC
  Administered 2018-09-14: 650 mg via ORAL

## 2018-09-14 NOTE — Telephone Encounter (Signed)
Gave avs and calendar ° °

## 2018-09-14 NOTE — Progress Notes (Signed)
Patient Care Team: Lauree Chandler, NP as PCP - General (Geriatric Medicine)  DIAGNOSIS:  Encounter Diagnosis  Name Primary?  . 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, estrogen receptor positive ([place])     SUMMARY OF ONCOLOGIC HISTORY:   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, estrogen receptor positive ([place])   06/23/2017 Initial Diagnosis    Left breast 11 o'clock position 6.3 x 4.2 x 6.1 cm mass with thick papillary projections, 2 adjacent left axillary lymph nodes; right breast 930 position 1.7 cm mass; left breast and left lymph node biopsy revealed IDC grade 3, ER 5%, PR 0%, Ki-67 50%, HER-2 positive ratio 2.04 T3N1 stage IIIa; right breast: Grade 1 IDC  ER 95%, PR 95%, Ki-67 15%, HER-2 negative; T1CN0 stage I a    08/27/2017 Surgery    Right simple mastectomy: IDC grade 1, 1.1 cm, DCIS low-grade, margins negative, 0/2 lymph nodes negative; ER 95%, PR 95%, HER-2 negative Ki-67 15% T1 cN0 stage I a left simple mastectomy: IDC grade 3, 6.2 cm, 4/13 lymph nodes positive, margins negative, ER 5 %, PR 0%, HER-2 positive ratio 2.04, Ki-67 50 % T3 N2 stage IIIa     10/14/2017 -  Chemotherapy    Adjuvant Herceptin alone for 1 year     10/23/2017 - 11/25/2017 Radiation Therapy    Adjuvant radiation therapy    12/05/2017 Genetic Testing    The Common Hereditary Cancer Panel offered by [facility] includes sequencing and/or deletion duplication testing of the following 47 genes: APC, ATM, AXIN2, BARD1, BMPR1A, BRCA1, BRCA2, BRIP1, CDH1, CDKN2A (p14ARF), CDKN2A (p16INK4a), CKD4, CHEK2, CTNNA1, DICER1, EPCAM (Deletion/duplication testing only), GREM1 (promoter region deletion/duplication testing only), KIT, MEN1, MLH1, MSH2, MSH3, MSH6, MUTYH, NBN, NF1, NHTL1, PALB2, PDGFRA, PMS2, POLD1, POLE, PTEN, RAD50, RAD51C, RAD51D, SDHB, SDHC, SDHD, SMAD4, SMARCA4. STK11, TP53, TSC1, TSC2, and VHL.  The following genes were evaluated for sequence changes only:  SDHA and HOXB13 c.251G>A variant only.  Results: Negative, no pathogenic variants identified.  The date of this test report is 12/05/2017.      CHIEF COMPLIANT: Last cycle of Herceptin  INTERVAL HISTORY: Charlene Rubio is a 73 year old above-mentioned history of bilateral mastectomies for left-sided breast cancer.  She went on Herceptin maintenance therapy.  She is here today to receive her last cycle of Herceptin.  She had no problems or toxicities from Herceptin.  REVIEW OF SYSTEMS:   Constitutional: Denies fevers, chills or abnormal weight loss Eyes: Denies blurriness of vision Ears, nose, mouth, throat, and face: Denies mucositis or sore throat Respiratory: Denies cough, dyspnea or wheezes Cardiovascular: Denies palpitation, chest discomfort Gastrointestinal:  Denies nausea, heartburn or change in bowel habits Skin: Denies abnormal skin rashes Lymphatics: Denies new lymphadenopathy or easy bruising Neurological:Denies numbness, tingling or new weaknesses Behavioral/Psych: Mood is stable, no new changes  Extremities: No lower extremity edema   All other systems were reviewed with the patient and are negative.  I have reviewed the past medical history, past surgical history, social history and family history with the patient and they are unchanged from previous note.  ALLERGIES:  has No Known Allergies.  MEDICATIONS:  Current Outpatient Medications  Medication Sig Dispense Refill  . carvedilol (COREG) 12.5 MG tablet TAKE 1 TABLET (12.5 MG TOTAL) BY MOUTH 2 (TWO) TIMES DAILY WITH A MEAL. 180 tablet 3  . donepezil (ARICEPT) 10 MG tablet Take 1 tablet (10 mg total) by mouth at bedtime. 90 tablet 1  .  ELIQUIS 5 MG TABS tablet TAKE 1 TABLET BY MOUTH TWICE A DAY 180 tablet 1  . ENTRESTO 24-26 MG TAKE 1 TABLET BY MOUTH TWICE A DAY 60 tablet 3  . furosemide (LASIX) 80 MG tablet TAKE 1 TABLET BY MOUTH EVERY DAY 90 tablet 1  . KLOR-CON M20 20 MEQ tablet TAKE 1 TABLET BY MOUTH EVERY DAY 90  tablet 1  . letrozole (FEMARA) 2.5 MG tablet Take 1 tablet (2.5 mg total) by mouth daily. 90 tablet 3  . Memantine HCl-Donepezil HCl (NAMZARIC) 28-10 MG CP24 Take 1 capsule by mouth daily. 30 capsule 2  . metFORMIN (GLUCOPHAGE-XR) 500 MG 24 hr tablet TAKE 1 TABLET (500 MG TOTAL) BY MOUTH 2 (TWO) TIMES DAILY WITH A MEAL. 180 tablet 1  . Multiple Vitamins-Minerals (ADULT GUMMY PO) Take 1 tablet by mouth 3 (three) times a week.    . spironolactone (ALDACTONE) 25 MG tablet Take 0.5 tablets (12.5 mg total) by mouth daily. 90 tablet 3   No current facility-administered medications for this visit.     PHYSICAL EXAMINATION: ECOG PERFORMANCE STATUS: 1 - Symptomatic but completely ambulatory  Vitals:   09/14/18 0920  BP: (!) 119/53  Pulse: 68  Resp: 16  Temp: 98.4 F (36.9 C)  SpO2: 97%   Filed Weights   09/14/18 0920  Weight: 169 lb 12.8 oz (77 kg)    GENERAL:alert, no distress and comfortable SKIN: skin color, texture, turgor are normal, no rashes or significant lesions EYES: normal, Conjunctiva are pink and non-injected, sclera clear OROPHARYNX:no exudate, no erythema and lips, buccal mucosa, and tongue normal  NECK: supple, thyroid normal size, non-tender, without nodularity LYMPH:  no palpable lymphadenopathy in the cervical, axillary or inguinal LUNGS: clear to auscultation and percussion with normal breathing effort HEART: regular rate & rhythm and no murmurs and no lower extremity edema ABDOMEN:abdomen soft, non-tender and normal bowel sounds MUSCULOSKELETAL:no cyanosis of digits and no clubbing  NEURO: alert & oriented x 3 with fluent speech, no focal motor/sensory deficits EXTREMITIES: No lower extremity edema   LABORATORY DATA:  I have reviewed the data as listed CMP Latest Ref Rng & Units 08/20/2018 08/03/2018 06/23/2018  Glucose 70 - 99 mg/dL 204(H) 189(H) 213(H)  BUN 8 - 23 mg/dL '20 19 19  ' Creatinine 0.44 - 1.00 mg/dL 0.79 0.80 0.83  Sodium 135 - 145 mmol/L 141 144 142    Potassium 3.5 - 5.1 mmol/L 4.6 3.8 4.2  Chloride 98 - 111 mmol/L 109 108 108  CO2 22 - 32 mmol/L 19(L) 22 24  Calcium 8.9 - 10.3 mg/dL 9.4 9.1 9.2  Total Protein 6.5 - 8.1 g/dL - 6.5 6.5  Total Bilirubin 0.3 - 1.2 mg/dL - 0.4 0.5  Alkaline Phos 38 - 126 U/L - 72 77  AST 15 - 41 U/L - 9(L) 10(L)  ALT 0 - 44 U/L - 10 9    Lab Results  Component Value Date   WBC 6.9 09/14/2018   HGB 12.7 09/14/2018   HCT 37.8 09/14/2018   MCV 97.2 09/14/2018   PLT 161 09/14/2018   NEUTROABS 6.0 09/14/2018    ASSESSMENT & PLAN:  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, estrogen receptor positive ([facility]) Right simple mastectomy: IDC grade 1, 1.1 cm, DCIS low-grade, margins negative, 0/2 lymph nodes negative; ER 95%, PR 95%, HER-2 negative Ki-67 15%T1 cN0 stage I a left simple mastectomy: IDC grade 3, 6.2 cm, 4/13 lymph nodes positive, margins negative, ER 5 %, PR 0%,  HER-2 positive ratio 2.04, Ki-67 50 % T3 N2 stage IIIa Echocardiogram 09/24/2017: EF 50%  Recommendation: 1. Adjuvant Herceptinstarted 10/14/17 2. Adjuvant radiation therapy started 10/23/17-11/25/17 3. adjuvant antiestrogen therapystarted February 2019 --------------------------------------------------  Herceptin Toxicities: Tolerating it well Monitoring closely for cardiac ejection fraction 03/02/2018 EF 45% follows with Dr. Marigene Ehlers. September 2019 echocardiogram revealed EF 50%. Cardiac MRI 05/29/2018: EF 45%, diffuse hypokinesis 05/08/2018: Echocardiogram EF 50%  Letrozoletoxicities:She is tolerating it extremely well. Denies any hot flashes or arthralgias or myalgias.  Today is her last treatment with Herceptin. I requested Dr. Dalbert Batman to remove her port.  Return to clinic in 3 months for survivorship care plan visit.    No orders of the defined types were placed in this encounter.  The patient has a good understanding of the overall plan. she agrees with it. she will call with any problems  that may develop before the next visit here.   Harriette Ohara, MD 09/14/18

## 2018-09-14 NOTE — Patient Instructions (Signed)
[facility] Discharge Instructions for Patients Receiving Chemotherapy  Today you received the following chemotherapy agents Herceptin  To help prevent nausea and vomiting after your treatment, we encourage you to take your nausea medication as directed   If you develop nausea and vomiting that is not controlled by your nausea medication, call the clinic.   BELOW ARE SYMPTOMS THAT SHOULD BE REPORTED IMMEDIATELY:  *FEVER GREATER THAN 100.5 F  *CHILLS WITH OR WITHOUT FEVER  NAUSEA AND VOMITING THAT IS NOT CONTROLLED WITH YOUR NAUSEA MEDICATION  *UNUSUAL SHORTNESS OF BREATH  *UNUSUAL BRUISING OR BLEEDING  TENDERNESS IN MOUTH AND THROAT WITH OR WITHOUT PRESENCE OF ULCERS  *URINARY PROBLEMS  *BOWEL PROBLEMS  UNUSUAL RASH Items with * indicate a potential emergency and should be followed up as soon as possible.  Feel free to call the clinic should you have any questions or concerns. The clinic phone number is [phone].  Please show the CHEMO ALERT CARD at check-in to the Emergency Department and triage nurse.   

## 2018-09-14 NOTE — Assessment & Plan Note (Signed)
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 Adjuvant Herceptinstarted 10/14/17 2. Adjuvant radiation therapy started 10/23/17-11/25/17 3. adjuvant antiestrogen therapystarted February 2019 --------------------------------------------------  Herceptin Toxicities: Tolerating it well Monitoring closely for cardiac ejection fraction 03/02/2018 EF 45% follows with Dr. Marigene Ehlers. September 2019 echocardiogram revealed EF 50%. Cardiac MRI 05/29/2018: EF 45%, diffuse hypokinesis 05/08/2018: Echocardiogram EF 50%  Letrozoletoxicities:She is tolerating it extremely well. Denies any hot flashes or arthralgias or myalgias.  Today is her last treatment with Herceptin. Return to clinic in 3 months for survivorship care plan visit.

## 2018-09-20 ENCOUNTER — Other Ambulatory Visit: Payer: Self-pay | Admitting: General Surgery

## 2018-09-22 ENCOUNTER — Telehealth (HOSPITAL_COMMUNITY): Payer: Self-pay

## 2018-09-22 NOTE — Telephone Encounter (Signed)
Called patient to confirm she is not taking both Aspirin and Eliquis.  In Epic patient only on Eliquis.  No VM set up on home number.  Called mobile, unable to leave a message. Will make another attempt.  Clearance faxed to [facility] [phone].  Pt to stop Eliquis 2 days prior to procedure per clearance.

## 2018-09-25 DIAGNOSIS — Z853 Personal history of malignant neoplasm of breast: Secondary | ICD-10-CM | POA: Diagnosis not present

## 2018-09-25 DIAGNOSIS — Z452 Encounter for adjustment and management of vascular access device: Secondary | ICD-10-CM | POA: Diagnosis not present

## 2018-09-30 ENCOUNTER — Telehealth (HOSPITAL_COMMUNITY): Payer: Self-pay

## 2018-09-30 NOTE — Telephone Encounter (Signed)
Cardiac clearance sent to [facility] attn J Haggett

## 2018-11-17 ENCOUNTER — Other Ambulatory Visit (HOSPITAL_COMMUNITY): Payer: Self-pay | Admitting: Cardiology

## 2018-11-20 ENCOUNTER — Encounter: Payer: Medicare Other | Admitting: Nurse Practitioner

## 2018-11-20 ENCOUNTER — Ambulatory Visit: Payer: Medicare Other | Admitting: Nurse Practitioner

## 2018-11-20 ENCOUNTER — Ambulatory Visit: Payer: Self-pay

## 2018-11-20 ENCOUNTER — Encounter: Payer: Self-pay | Admitting: Nurse Practitioner

## 2018-11-22 ENCOUNTER — Other Ambulatory Visit (HOSPITAL_COMMUNITY): Payer: Self-pay | Admitting: Cardiology

## 2018-11-24 ENCOUNTER — Other Ambulatory Visit: Payer: Self-pay

## 2018-11-24 MED ORDER — METFORMIN HCL ER 500 MG PO TB24: ORAL_TABLET | ORAL | 1 refills | Status: DC

## 2018-11-25 ENCOUNTER — Encounter: Payer: Self-pay | Admitting: General Practice

## 2018-11-25 NOTE — Progress Notes (Signed)
[facility] Team contacted patient to assess for food insecurity and other psychosocial needs during current COVID19 pandemic.    Patient/family expressed no needs at this time.  Support Team member encouraged patient to call if changes occur or they have any other questions/concerns.    Beverely Pace, Gam[place]

## 2018-12-09 ENCOUNTER — Encounter: Payer: Self-pay | Admitting: Adult Health

## 2018-12-09 NOTE — Telephone Encounter (Signed)
This encounter was created in error - please disregard.

## 2019-01-01 ENCOUNTER — Other Ambulatory Visit: Payer: Self-pay | Admitting: Hematology and Oncology

## 2019-01-01 ENCOUNTER — Other Ambulatory Visit (HOSPITAL_COMMUNITY): Payer: Self-pay | Admitting: Cardiology

## 2019-01-07 ENCOUNTER — Telehealth: Payer: Self-pay | Admitting: Adult Health

## 2019-01-07 NOTE — Telephone Encounter (Signed)
Per 5/20 schedule message moved 6/1 visit to 6/3 as webex. Left message for patient.

## 2019-01-18 ENCOUNTER — Encounter: Payer: Medicare Other | Admitting: Adult Health

## 2019-01-19 ENCOUNTER — Telehealth: Payer: Self-pay | Admitting: Adult Health

## 2019-01-19 ENCOUNTER — Telehealth: Payer: Self-pay | Admitting: Hematology and Oncology

## 2019-01-19 NOTE — Telephone Encounter (Signed)
Called patient regarding Webex appointment, per patient's this needs to be a telephone visit.

## 2019-01-19 NOTE — Telephone Encounter (Signed)
Contacted pt's husband to verify telephone visit for pre reg

## 2019-01-20 ENCOUNTER — Encounter: Payer: Self-pay | Admitting: Adult Health

## 2019-01-20 ENCOUNTER — Inpatient Hospital Stay: Payer: Medicare Other | Attending: Adult Health | Admitting: Adult Health

## 2019-01-20 ENCOUNTER — Other Ambulatory Visit: Payer: Self-pay | Admitting: Cardiovascular Disease

## 2019-01-20 DIAGNOSIS — Z9221 Personal history of antineoplastic chemotherapy: Secondary | ICD-10-CM

## 2019-01-20 DIAGNOSIS — C50812 Malignant neoplasm of overlapping sites of left female breast: Secondary | ICD-10-CM

## 2019-01-20 DIAGNOSIS — Z923 Personal history of irradiation: Secondary | ICD-10-CM

## 2019-01-20 DIAGNOSIS — Z170 Estrogen receptor positive status [ER+]: Secondary | ICD-10-CM

## 2019-01-20 DIAGNOSIS — Z9013 Acquired absence of bilateral breasts and nipples: Secondary | ICD-10-CM

## 2019-01-20 DIAGNOSIS — Z79811 Long term (current) use of aromatase inhibitors: Secondary | ICD-10-CM

## 2019-01-20 DIAGNOSIS — C50811 Malignant neoplasm of overlapping sites of right female breast: Secondary | ICD-10-CM | POA: Diagnosis not present

## 2019-01-20 NOTE — Telephone Encounter (Signed)
73yo FEMALE WT 77kg Scr = 0.83 on 09/14/2018 Last OV 09/02/2018 with DR Aundra Dubin

## 2019-01-20 NOTE — Progress Notes (Signed)
SURVIVORSHIP VIRTUAL VISIT:  I connected with Charlene Rubio and her husband Charlene Rubio (patient has h/o demential) on 01/20/19 at  3:00 PM EDT by telephone and verified that I am speaking with the correct person using two identifiers.   I discussed the limitations, risks, security and privacy concerns of performing an evaluation and management service by telephone and the availability of in person appointments. I also discussed with the patient that there may be a patient responsible charge related to this service. The patient expressed understanding and agreed to proceed.   BRIEF ONCOLOGIC HISTORY:    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, estrogen receptor positive ([place])   06/23/2017 Initial Diagnosis    Left breast 11 o'clock position 6.3 x 4.2 x 6.1 cm mass with thick papillary projections, 2 adjacent left axillary lymph nodes; right breast 930 position 1.7 cm mass; left breast and left lymph node biopsy revealed IDC grade 3, ER 5%, PR 0%, Ki-67 50%, HER-2 positive ratio 2.04 T3N1 stage IIIa; right breast: Grade 1 IDC  ER 95%, PR 95%, Ki-67 15%, HER-2 negative; T1CN0 stage I a    08/27/2017 Surgery    Right simple mastectomy: IDC grade 1, 1.1 cm, DCIS low-grade, margins negative, 0/2 lymph nodes negative; ER 95%, PR 95%, HER-2 negative Ki-67 15% T1 cN0 stage I a left simple mastectomy: IDC grade 3, 6.2 cm, 4/13 lymph nodes positive, margins negative, ER 5 %, PR 0%, HER-2 positive ratio 2.04, Ki-67 50 % T3 N2 stage IIIa     10/14/2017 - 09/2018 Chemotherapy    Adjuvant Herceptin alone for 1 year     09/2017 -  Anti-estrogen oral therapy    Letrozole daily    10/23/2017 - 11/25/2017 Radiation Therapy    Adjuvant radiation therapy    12/05/2017 Genetic Testing    The Common Hereditary Cancer Panel offered by [facility] includes sequencing and/or deletion duplication testing of the following 47 genes: APC, ATM, AXIN2, BARD1, BMPR1A, BRCA1, BRCA2, BRIP1, CDH1, CDKN2A (p14ARF),  CDKN2A (p16INK4a), CKD4, CHEK2, CTNNA1, DICER1, EPCAM (Deletion/duplication testing only), GREM1 (promoter region deletion/duplication testing only), KIT, MEN1, MLH1, MSH2, MSH3, MSH6, MUTYH, NBN, NF1, NHTL1, PALB2, PDGFRA, PMS2, POLD1, POLE, PTEN, RAD50, RAD51C, RAD51D, SDHB, SDHC, SDHD, SMAD4, SMARCA4. STK11, TP53, TSC1, TSC2, and VHL.  The following genes were evaluated for sequence changes only: SDHA and HOXB13 c.251G>A variant only.  Results: Negative, no pathogenic variants identified.  The date of this test report is 12/05/2017.      INTERVAL HISTORY:  Charlene Rubio to review her survivorship care plan detailing her treatment course for breast cancer, as well as monitoring long-term side effects of that treatment, education regarding health maintenance, screening, and overall wellness and health promotion.     Overall, Charlene Rubio reports feeling quite well. Her husband assisted with the telephone visit due to patient dementia.  He verified her information.  Charlene Rubio is taking Letrozole daily and is tolerating it quite well.  She dneies hot flahses or arthralgias.     REVIEW OF SYSTEMS:  Review of Systems  Constitutional: Negative for appetite change, chills, fatigue, fever and unexpected weight change.  HENT:   Negative for hearing loss, lump/mass, mouth sores, sore throat and trouble swallowing.   Eyes: Negative for eye problems and icterus.  Respiratory: Negative for chest tightness, cough and shortness of breath.   Cardiovascular: Negative for chest pain, leg swelling and palpitations.  Gastrointestinal: Negative for abdominal distention, abdominal pain, constipation, diarrhea, nausea and vomiting.  Endocrine:  Negative for hot flashes.  Musculoskeletal: Negative for arthralgias.  Skin: Negative for itching and rash.  Neurological: Negative for dizziness, extremity weakness, headaches and numbness.  Hematological: Negative for adenopathy. Does not bruise/bleed easily.  Psychiatric/Behavioral:  Negative for depression. The patient is not nervous/anxious.   Breast: Denies any new nodularity, masses, tenderness, nipple changes, or nipple discharge.      ONCOLOGY TREATMENT TEAM:  1. Surgeon:  Dr. Dalbert Batman at [facility] 2. Medical Oncologist: [name]  3. Radiation Oncologist: Dr. Isidore Moos    PAST MEDICAL/SURGICAL HISTORY:  Past Medical History:  Diagnosis Date  . Acute diastolic (congestive) heart failure ([place]) 04/25/2017  . Acute respiratory failure with hypoxia and hypercapnia ([place]) 04/25/2017  . ALLERGIC RHINITIS 07/01/2007   Qualifier: Diagnosis of  By: Sherren Mocha MD, Jory Ee   . Allergy   . Aortic insufficiency   . Aortic valve disorder 07/01/2007   Qualifier: Diagnosis of  By: Sherren Mocha MD, Jory Ee   . Arthritis   . Bilateral leg edema 04/25/2017  . Borderline diabetic   . Cancer [facility])    Breast cancer  . CHF (congestive heart failure) ([place]) 04/25/2017  . Dementia ([place]) 04/30/2017  . Diabetes mellitus ([place]) 04/25/2017  . DOE (dyspnea on exertion) 04/25/2017  . Essential hypertension 07/01/2007   Qualifier: Diagnosis of  By: Sherren Mocha MD, Jory Ee Glaucoma suspect   . Hypertension   . KNEE PAIN, LEFT 04/06/2010   Qualifier: Diagnosis of  By: Sherren Mocha MD, Jory Ee   . Nonischemic cardiomyopathy ([place])   . Obesity   . Obesity, unspecified 07/01/2007   Qualifier: Diagnosis of  By: Sherren Mocha MD, Jory Ee   . Peripheral vascular disease ([place])    blood clots in legs  . Thyroid disease    Past Surgical History:  Procedure Laterality Date  . CESAREAN SECTION    . MASTECTOMY  08/27/2017   LEFT BREAST MODIFIED RADICAL MASTECTOMY (Left Breast)  . MASTECTOMY  08/27/2017   RIGHT TOTAL MASTECTOMY WITH SENTINEL LYMPH NODE BIOPSY (Right Breast)  . MASTECTOMY W/ SENTINEL NODE BIOPSY Right 08/27/2017   Procedure: RIGHT TOTAL MASTECTOMY WITH SENTINEL LYMPH NODE BIOPSY;  Surgeon: Fanny Skates, MD;  Location: [facility];  Service: General;  Laterality: Right;  . MODIFIED MASTECTOMY Left  08/27/2017   Procedure: LEFT BREAST MODIFIED RADICAL MASTECTOMY;  Surgeon: Fanny Skates, MD;  Location: [place];  Service: General;  Laterality: Left;  . PORTACATH PLACEMENT Right 10/09/2017  . PORTACATH PLACEMENT Right 10/09/2017   Procedure: INSERTION PORT-A-CATH WITH ULTRASOUND;  Surgeon: Fanny Skates, MD;  Location: [place];  Service: General;  Laterality: Right;     ALLERGIES:  No Known Allergies   CURRENT MEDICATIONS:  Outpatient Encounter Medications as of 01/20/2019  Medication Sig  . carvedilol (COREG) 12.5 MG tablet TAKE 1 TABLET (12.5 MG TOTAL) BY MOUTH 2 (TWO) TIMES DAILY WITH A MEAL.  Marland Kitchen donepezil (ARICEPT) 10 MG tablet Take 1 tablet (10 mg total) by mouth at bedtime.  Marland Kitchen ELIQUIS 5 MG TABS tablet TAKE 1 TABLET BY MOUTH TWICE A DAY  . ENTRESTO 24-26 MG TAKE 1 TABLET BY MOUTH TWICE A DAY  . furosemide (LASIX) 80 MG tablet TAKE 1 TABLET BY MOUTH  DAILY  . KLOR-CON M20 20 MEQ tablet TAKE 1 TABLET BY MOUTH EVERY DAY  . letrozole (FEMARA) 2.5 MG tablet TAKE 1 TABLET BY MOUTH  DAILY  . Memantine HCl-Donepezil HCl (NAMZARIC) 28-10 MG CP24 Take 1 capsule by mouth daily.  . metFORMIN (  GLUCOPHAGE-XR) 500 MG 24 hr tablet Take 1 tablet by mouth 2 times daily with meal. E11.8  . Multiple Vitamins-Minerals (ADULT GUMMY PO) Take 1 tablet by mouth 3 (three) times a week.  . spironolactone (ALDACTONE) 25 MG tablet Take 0.5 tablets (12.5 mg total) by mouth daily.   No facility-administered encounter medications on file as of 01/20/2019.      ONCOLOGIC FAMILY HISTORY:  Family History  Problem Relation Age of Onset  . Alzheimer's disease Mother 97  . Alzheimer's disease Father 33  . Congestive Heart Failure Father   . Alcoholism Brother   . Alzheimer's disease Brother   . Dementia Brother   . Alcoholism Brother   . Diabetes Brother      GENETIC COUNSELING/TESTING: Not at this time  SOCIAL HISTORY:  Social History   Socioeconomic History  . Marital status: Married    Spouse name:  Not on file  . Number of children: Not on file  . Years of education: Not on file  . Highest education level: Not on file  Occupational History  . Occupation: retired Engineer, structural  . Financial resource strain: Not hard at all  . Food insecurity:    Worry: Never true    Inability: Never true  . Transportation needs:    Medical: No    Non-medical: No  Tobacco Use  . Smoking status: Never Smoker  . Smokeless tobacco: Never Used  Substance and Sexual Activity  . Alcohol use: No  . Drug use: No  . Sexual activity: Not Currently  Lifestyle  . Physical activity:    Days per week: 0 days    Minutes per session: 0 min  . Stress: Only a little  Relationships  . Social connections:    Talks on phone: More than three times a week    Gets together: More than three times a week    Attends religious service: Never    Active member of club or organization: No    Attends meetings of clubs or organizations: Never    Relationship status: Married  . Intimate partner violence:    Fear of current or ex partner: No    Emotionally abused: No    Physically abused: No    Forced sexual activity: No  Other Topics Concern  . Not on file  Social History Narrative   Social History   Diet?    Do you drink/eat things with caffeine? Yes (minimal) coke   Marital status?          married                          What year were you married? 1968   Do you live in a house, apartment, assisted living, condo, trailer, etc.? house   Is it one or more stories? one   How many persons live in your home? 2   Do you have any pets in your home? (please list) none   Highest level of education completed?   Current or past profession: preschool teacher/nanny   Do you exercise?         0                             Type & how often?   Advanced Directives   Do you have a living will?   Do you have a DNR form?  If not, do you want to discuss one?   Do you have signed  POA/HPOA for forms?    Functional Status   Do you have difficulty bathing or dressing yourself?   Do you have difficulty preparing food or eating?    Do you have difficulty managing your medications?   Do you have difficulty managing your finances?   Do you have difficulty affording your medications?    Admitted to [facility] 05/01/17   Never smoked   Alcohol none   DNR        OBSERVATIONS/OBJECTIVE:  Mikesha's speech is normal and non pressured, and she is in no apparent distress.  Mood and behavior are normal.  Breathing is non labored.   LABORATORY DATA:  None for this visit.  DIAGNOSTIC IMAGING:  None for this visit.      ASSESSMENT AND PLAN:  Ms.. Peden is a pleasant 73 y.o. female with bilateral breast cancer, stages IA to IIA, one, ER+/PR+/HER2-, and the other ER positive and HER-2 positive,  diagnosed in 06/2017, treated with bilateral mastectomies, maintenance trastuzumab x 1 year, adjuvant radiation therapy, and anti-estrogen therapy with Letrozole beginning in 09/2017.  She presents to the Survivorship Clinic for our initial meeting and routine follow-up post-completion of treatment for breast cancer.    1. Bilateral breast cancer:  Ms. Bultman is continuing to recover from definitive treatment for breast cancer. She will follow-up with her medical oncologist, [name] in 3 months with history and physical exam per surveillance protocol.  She will continue her anti-estrogen therapy with Letrozole. Thus far, she is tolerating the Letrozole well, with minimal side effects. She was instructed to make [name] or myself aware if she begins to experience any worsening side effects of the medication and I could see her back in clinic to help manage those side effects, as needed.  Today, a comprehensive survivorship care plan and treatment summary was reviewed with the patient today detailing her breast cancer diagnosis, treatment course, potential late/long-term effects of  treatment, appropriate follow-up care with recommendations for the future, and patient education resources.  A copy of this summary, along with a letter will be sent to the patient's primary care provider via mail/fax/In Basket message after today's visit.    2. Dementia: FH of Alzheimers.  Memory is an issue for her, and she sees her PCP regularly and is taking Aricept regularly.  She is f/u with her PCP later this month with them.  3. Weakness: I recommended that Izora Gala do some strengthening exercises.  Offered to refer to PT if necessary.  Recommended she walk when possible to help keep her independence and autonomy.    3. Bone health:  Given Ms. Portales's age/history of breast cancer and her current treatment regimen including anti-estrogen therapy with Letrozole, she is at risk for bone demineralization. She will f/u with her PCP and inquire when She was given education on specific activities to promote bone health.  4. Cancer screening:  Due to Ms. Corning's history and her age, she should receive screening for skin cancers, colon cancer, and gynecologic cancers.  The information and recommendations are listed on the patient's comprehensive care plan/treatment summary and were reviewed in detail with the patient.    5. Health maintenance and wellness promotion: Ms. Dalia was encouraged to consume 5-7 servings of fruits and vegetables per day. We reviewed the "Nutrition Rainbow" handout, as well as the handout "Take Control of Your Health and Reduce Your Cancer Risk"  from the [employer].  She was also encouraged to engage in moderate to vigorous exercise for 30 minutes per day most days of the week. We discussed the [employer] fitness program, which is designed for cancer survivors to help them become more physically fit after cancer treatments.  She was instructed to limit her alcohol consumption and continue to abstain from tobacco use.     6. Support services/counseling: It is not  uncommon for this period of the patient's cancer care trajectory to be one of many emotions and stressors.  We discussed how this can be increasingly difficult during the times of quarantine and social distancing due to the COVID-19 pandemic.   She was given information regarding our available services and encouraged to contact me with any questions or for help enrolling in any of our support group/programs.    Follow up instructions:    -Return to cancer center in 3 months for f/u with [name] -She is welcome to return back to the Survivorship Clinic at any time; no additional follow-up needed at this time.  -Consider referral back to survivorship as a long-term survivor for continued surveillance  The patient was provided an opportunity to ask questions and all were answered. The patient agreed with the plan and demonstrated an understanding of the instructions.   The patient was advised to call back or seek an in-person evaluation if the symptoms worsen or if the condition fails to improve as anticipated.   I provided 16 minutes of non face-to-face telephone visit time during this encounter, and > 50% was spent counseling as documented under my assessment & plan.  Scot Dock, NP

## 2019-01-21 ENCOUNTER — Telehealth: Payer: Self-pay | Admitting: Hematology and Oncology

## 2019-01-21 NOTE — Telephone Encounter (Signed)
I left a message regarding schedule  

## 2019-01-28 ENCOUNTER — Other Ambulatory Visit: Payer: Self-pay

## 2019-01-28 ENCOUNTER — Ambulatory Visit (HOSPITAL_BASED_OUTPATIENT_CLINIC_OR_DEPARTMENT_OTHER)
Admission: RE | Admit: 2019-01-28 | Discharge: 2019-01-28 | Disposition: A | Discharge status: Home / Self Care | Payer: Medicare Other | Source: Ambulatory Visit | Attending: Cardiology | Admitting: Cardiology

## 2019-01-28 ENCOUNTER — Ambulatory Visit (HOSPITAL_COMMUNITY)
Admission: RE | Admit: 2019-01-28 | Discharge: 2019-01-28 | Disposition: A | Discharge status: Home / Self Care | Payer: Medicare Other | Source: Ambulatory Visit | Attending: Cardiology | Admitting: Cardiology

## 2019-01-28 ENCOUNTER — Encounter (HOSPITAL_COMMUNITY): Payer: Medicare Other | Admitting: Cardiology

## 2019-01-28 DIAGNOSIS — Z853 Personal history of malignant neoplasm of breast: Secondary | ICD-10-CM | POA: Diagnosis not present

## 2019-01-28 DIAGNOSIS — I080 Rheumatic disorders of both mitral and aortic valves: Secondary | ICD-10-CM | POA: Insufficient documentation

## 2019-01-28 DIAGNOSIS — I5022 Chronic systolic (congestive) heart failure: Secondary | ICD-10-CM | POA: Diagnosis not present

## 2019-01-28 DIAGNOSIS — I5032 Chronic diastolic (congestive) heart failure: Secondary | ICD-10-CM | POA: Diagnosis not present

## 2019-01-28 NOTE — Patient Instructions (Addendum)
Labs needed: BMET and CBC We will only contact you if something comes back abnormal or we need to make some changes. Otherwise no news is good news!   2.Your physician recommends 6 month followup.  You will get a call in a few months to schedule this appointment.

## 2019-01-28 NOTE — Progress Notes (Signed)
Echocardiogram 2D Echocardiogram has been performed.  Oneal Deputy Jakai Onofre 01/28/2019, 10:57 AM

## 2019-01-28 NOTE — Progress Notes (Addendum)
Called patient to schedule lab visit. No answer on home phone. No vm available. Left message on cell to return call to office. AVS mailed    AVS: Need BMET and CBC  Sent staff message to [facility] to add patient for recall of 6 months.

## 2019-01-30 NOTE — Progress Notes (Signed)
Heart Failure TeleHealth Note  Due to national recommendations of social distancing due to [place] 19, Audio/video telehealth visit is felt to be most appropriate for this patient at this time.  See MyChart message from today for patient consent regarding telehealth for [facility].  Date:  01/30/2019   ID:  Charlene Rubio, DOB 07/12/1946, MRN [id number]  Location: Home  Provider location: [facility] Type of Visit: Established patient   PCP:  Lauree Chandler, NP  Cardiologist: Dr. Aundra Dubin Oncology: [name]  Chief Complaint: "I feel fine"   History of Present Illness: Charlene Rubio is a 73 y.o. female who presents via audio/video conferencing for a telehealth visit today.     she denies symptoms worrisome for COVID 19.   Patient has a history of breast cancer, chronic systolic CHF, chronic left leg DVT, and dementia.  She was initially referred by [name] for cardio-oncology evaluation given Herceptin use.   Patient went a number of years without accessing medical care.  However, in 9/18, she was admitted with dyspnea and was found to have a CHF exacerbation.  Echo showed EF 25-30%.  She was diuresed.  Cardiolite in 10/18 showed EF 41% with no ischemia.  Blood glucose was also noted to be very high this admission.  She was then diagnosed with breast cancer and had bilateral mastectomies in 1/19.  ER+/PR+/HER2+. Planned for Herceptin starting 2/19 to continue until 1/20.  Radiation 3/19-4/19.   She had an echo in 2/19 suggesting improved EF, 50-55%.  However, repeat echo in 5/19 showed EF 45% with mild AS and moderate AI, normal RV size and systolic function.  Echo in 9/19 showed EF 50% with mild LVH, normal RV size and systolic function, mild AS with mild-moderate aortic insufficiency. She had cardiac MRI in 10/19, showing EF 45% with moderate LV dilation, normal RV size with EF 43%, moderate AI, no LGE.   Echo from this week was reviewed, EF 55%, mild RV  dilation with normal systolic function, moderate aortic insufficiency.   History comes from her husband due to dementia.  She says that she feels "fine."  Husband says that her memory has progressively worsened.  She does not have motivation to do much, she is inactive and relies on him for most ADLs.  She has not complained of chest pain or dyspnea.  She has no leg swelling.  She completed Herceptin in 2/20.   Labs (5/19): K 3.4, creatinine 0.74, hgb 12.4 Labs (7/19): K 3.4, creatinine 0.81, LDL 79 Labs (8/19): K 4.3, creatinine 0.78 Labs (12/19): K 3.8, creatinine 0.8, LDL 75 Labs (1/20): K 3.8, creatinine 0.83, hgb 12.7  PMH: 1. Breast cancer: Bilateral mastectomies in 1/19.  ER+/PR+/HER2+. Planned for Herceptin starting 2/19 to continue for a year.  Radiation 3/19-4/19.  2. Type II diabetes 3. Chronic DVT left leg: Found in 9/18.  4. Dementia 5. HTN 6. Chronic systolic CHF: ?Etiology.  - Echo (9/18): EF 25-30% - Cardiolite (10/18): EF 41%, no ischemia.  - Echo (2/19): EF 50-55%, mild LV dilation, mild LVH, mild AS, mild-moderate AI.  - Echo (5/19): EF 45%, diffuse hypokinesis, normal RV size and systolic function, mild AS with moderate AI.  - Echo (7/19): EF 45%, mild LV dilation, diffuse hypokinesis, normal RV, moderate AI/mild AS.  - Echo (9/19): EF 50%, mild LVH, diffuse hypokinesis, mild AS and mild-moderate aortic insufficiency.  - Cardiac MRI (10/19): EF 45%, diffuse hypokinesis, moderate LV dilation, normal  RV size with EF 43%, moderate AI, no LGE.  - Echo (6/20): EF 55%, mild RV dilation with normal RV systolic function, moderate AI.   Current Outpatient Medications  Medication Sig Dispense Refill  . carvedilol (COREG) 12.5 MG tablet TAKE 1 TABLET (12.5 MG TOTAL) BY MOUTH 2 (TWO) TIMES DAILY WITH A MEAL. 180 tablet 3  . donepezil (ARICEPT) 10 MG tablet Take 1 tablet (10 mg total) by mouth at bedtime. 90 tablet 1  . ELIQUIS 5 MG TABS tablet TAKE 1 TABLET BY MOUTH TWICE A  DAY 180 tablet 1  . ENTRESTO 24-26 MG TAKE 1 TABLET BY MOUTH TWICE A DAY 60 tablet 3  . furosemide (LASIX) 80 MG tablet TAKE 1 TABLET BY MOUTH  DAILY 90 tablet 3  . KLOR-CON M20 20 MEQ tablet TAKE 1 TABLET BY MOUTH EVERY DAY 90 tablet 1  . letrozole (FEMARA) 2.5 MG tablet TAKE 1 TABLET BY MOUTH  DAILY 90 tablet 0  . Memantine HCl-Donepezil HCl (NAMZARIC) 28-10 MG CP24 Take 1 capsule by mouth daily. 30 capsule 2  . metFORMIN (GLUCOPHAGE-XR) 500 MG 24 hr tablet Take 1 tablet by mouth 2 times daily with meal. E11.8 180 tablet 1  . Multiple Vitamins-Minerals (ADULT GUMMY PO) Take 1 tablet by mouth 3 (three) times a week.    . spironolactone (ALDACTONE) 25 MG tablet Take 0.5 tablets (12.5 mg total) by mouth daily. 90 tablet 3   No current facility-administered medications for this encounter.     Allergies:   Patient has no known allergies.   Social History:  The patient  reports that she has never smoked. She has never used smokeless tobacco. She reports that she does not drink alcohol or use drugs.   Family History:  The patient's family history includes Alcoholism in her brother and brother; Alzheimer's disease in her brother; Alzheimer's disease (age of onset: 66) in her father and mother; Congestive Heart Failure in her father; Dementia in her brother; Diabetes in her brother.   ROS:  Please see the history of present illness.   All other systems are personally reviewed and negative.   Exam:  (Video/Tele Health Call; Exam is subjective and or/visual.) General:  Speaks in full sentences. No resp difficulty. Lungs: Normal respiratory effort with conversation.  Abdomen: Non-distended per patient report Extremities: Husband reports minimal edema.  Neuro: Alert & oriented x 3.   Recent Labs: 09/14/2018: ALT 10; BUN 20; Creatinine 0.83; Hemoglobin 12.7; Platelet Count 161; Potassium 3.8; Sodium 143  Personally reviewed   Wt Readings from Last 3 Encounters:  09/14/18 77 kg (169 lb 12.8 oz)   08/05/18 76.2 kg (168 lb)  08/03/18 74.8 kg (165 lb)      ASSESSMENT AND PLAN:  1. Chronic left leg DVT: She is on Eliquis.  With cancer diagnosis, will continue chronically.  She has venous insufficiency in the left leg likely related to prior DVT.  - I will arrange for CBC given Eliquis use.  2. Chronic systolic CHF: Cardiomyopathy of uncertain etiology. First diagnosed in 9/18 with CHF exacerbation, EF by echo at the time was 25-30%.  Cardiolite in 10/18 showed no ischemia, EF 41%.  Echo in 2/19 was read as showing improved EF, 50-55%.  Echo in 5/19 showed EF about 45%.  Echo in 7/19 showed EF 45% with diffuse hypokinesis.  Echo in 9/19 showed EF 50%, some improvement.  Cardiac MRI in 10/19 showed EF 45%, no LGE, moderate AI.  Echo from 6/20 was reviewed, EF  55% with moderate AI.  She has now completed Herceptin.  Weight is stable, I do not think that she is significantly volume overloaded.  - Continue Lasix 80 mg daily with KCl 20 daily.    - Continue Coreg 12.5 mg bid, spironolactone 12.5 daily, and Entresto 24/26 bid.  - I will arrange for BMET.  3. Aortic valve disorder: Moderate aortic insufficiency by echo and cardiac MRI.    COVID screen The patient does not have any symptoms that suggest any further testing/ screening at this time.  Social distancing reinforced today.  Patient Risk: After full review of this patients clinical status, I feel that they are at moderate risk for cardiac decompensation at this time.  Relevant cardiac medications were reviewed at length with the patient today. The patient does not have concerns regarding their medications at this time.   Recommended follow-up:  6 months  Today, I have spent 16 minutes with the patient with telehealth technology discussing the above issues .    Signed, Loralie Champagne, MD  01/30/2019 2:45 PM  [facility] [address] [facility] [place] [id number] [phone]  (office) [phone] (fax)

## 2019-02-22 ENCOUNTER — Ambulatory Visit (INDEPENDENT_AMBULATORY_CARE_PROVIDER_SITE_OTHER): Payer: Medicare Other | Admitting: Family

## 2019-02-22 ENCOUNTER — Ambulatory Visit: Payer: Self-pay | Admitting: Nurse Practitioner

## 2019-02-22 ENCOUNTER — Other Ambulatory Visit: Payer: Self-pay

## 2019-02-22 ENCOUNTER — Encounter: Payer: Self-pay | Admitting: Family

## 2019-02-22 DIAGNOSIS — Z0000 Encounter for general adult medical examination without abnormal findings: Secondary | ICD-10-CM | POA: Diagnosis not present

## 2019-02-22 DIAGNOSIS — F0390 Unspecified dementia without behavioral disturbance: Secondary | ICD-10-CM | POA: Diagnosis not present

## 2019-02-22 DIAGNOSIS — R197 Diarrhea, unspecified: Secondary | ICD-10-CM | POA: Diagnosis not present

## 2019-02-22 NOTE — Progress Notes (Signed)
This service is provided via telemedicine  No vital signs collected/recorded due to the encounter was a telemedicine visit.   Location of patient (ex: home, work):  Home   Patient consents to a telephone visit:  Yes  Location of the provider (ex: office, home):  Office   Name of any referring provider: Sherrie Mustache, NP  Names of all persons participating in the telemedicine service and their role in the encounter:  Ruthell Rummage CMA, Blayton Huttner NP, Roslynn Amble and husband [name]  Time spent on call:  Ruthell Rummage CMA, spent 23 Minutes on phone with patient    Subjective:   Charlene Rubio is a 73 y.o. female who presents for Medicare Annual (Subsequent) preventive examination.  Review of Systems:   Cardiac Risk Factors include: advanced age (>15mn, >>96women);dyslipidemia     Objective:     Vitals: LMP  (LMP Unknown)   There is no height or weight on file to calculate BMI.  Advanced Directives 02/22/2019 07/20/2018 12/09/2017 11/14/2017 10/10/2017 09/23/2017 09/12/2017  Does Patient Have a Medical Advance Directive? Yes Yes No No No No Yes  Type of AParamedicof AClintLiving will;Out of facility DNR (pink MOST or yellow form) Out of facility DNR (pink MOST or yellow form) - - - - Out of facility DNR (pink MOST or yellow form)  Does patient want to make changes to medical advance directive? No - Patient declined No - Patient declined - - No - Patient declined No - Patient declined No - Patient declined  Copy of [place] Chart? No - copy requested - - - - - -  Would patient like information on creating a medical advance directive? - - - Yes (MAU/Ambulatory/Procedural Areas - Information given) - - No - Patient declined  Pre-existing out of facility DNR order (yellow form or pink MOST form) - Yellow form placed in chart (order not valid for inpatient use) - - - - Yellow form placed in chart (order not valid for inpatient use)     Tobacco Social History   Tobacco Use  Smoking Status Never Smoker  Smokeless Tobacco Never Used     Counseling given: Not Answered   Clinical Intake:  Pre-visit preparation completed: No  Pain : No/denies pain     Nutritional Risks: None Diabetes: Yes CBG done?: No Did pt. bring in CBG monitor from home?: No  How often do you need to have someone help you when you read instructions, pamphlets, or other written materials from your doctor or pharmacy?: 5 - Always(Husband assist) What is the last grade level you completed in school?: 12 Grade  Interpreter Needed?: No  Information entered by :: Kameah Rawl FNP-C  Past Medical History:  Diagnosis Date  . Acute diastolic (congestive) heart failure ([facility] 04/25/2017  . Acute respiratory failure with hypoxia and hypercapnia ([facility] 04/25/2017  . ALLERGIC RHINITIS 07/01/2007   Qualifier: Diagnosis of  By: TSherren MochaMD, JJory Ee  . Allergy   . Aortic insufficiency   . Aortic valve disorder 07/01/2007   Qualifier: Diagnosis of  By: TSherren MochaMD, JJory Ee  . Arthritis   . Bilateral leg edema 04/25/2017  . Borderline diabetic   . Cancer ([facility]    Breast cancer  . CHF (congestive heart failure) ([facility] 04/25/2017  . Dementia ([facility] 04/30/2017  . Diabetes mellitus ([facility] 04/25/2017  . DOE (dyspnea on exertion) 04/25/2017  . Essential hypertension 07/01/2007   Qualifier: Diagnosis of  By:  Sherren Mocha MD, Jory Ee Glaucoma suspect   . Hypertension   . KNEE PAIN, LEFT 04/06/2010   Qualifier: Diagnosis of  By: Sherren Mocha MD, Jory Ee   . Nonischemic cardiomyopathy ([place])   . Obesity   . Obesity, unspecified 07/01/2007   Qualifier: Diagnosis of  By: Sherren Mocha MD, Jory Ee   . Peripheral vascular disease ([place])    blood clots in legs  . Thyroid disease    Past Surgical History:  Procedure Laterality Date  . CESAREAN SECTION    . MASTECTOMY  08/27/2017   LEFT BREAST MODIFIED RADICAL MASTECTOMY (Left Breast)  . MASTECTOMY  08/27/2017   RIGHT TOTAL  MASTECTOMY WITH SENTINEL LYMPH NODE BIOPSY (Right Breast)  . MASTECTOMY W/ SENTINEL NODE BIOPSY Right 08/27/2017   Procedure: RIGHT TOTAL MASTECTOMY WITH SENTINEL LYMPH NODE BIOPSY;  Surgeon: Fanny Skates, MD;  Location: [facility];  Service: General;  Laterality: Right;  . MODIFIED MASTECTOMY Left 08/27/2017   Procedure: LEFT BREAST MODIFIED RADICAL MASTECTOMY;  Surgeon: Fanny Skates, MD;  Location: [place];  Service: General;  Laterality: Left;  . PORTACATH PLACEMENT Right 10/09/2017  . PORTACATH PLACEMENT Right 10/09/2017   Procedure: INSERTION PORT-A-CATH WITH ULTRASOUND;  Surgeon: Fanny Skates, MD;  Location: [facility];  Service: General;  Laterality: Right;   Family History  Problem Relation Age of Onset  . Alzheimer's disease Mother 4  . Alzheimer's disease Father 46  . Congestive Heart Failure Father   . Alcoholism Brother   . Alzheimer's disease Brother   . Dementia Brother   . Alcoholism Brother   . Diabetes Brother    Social History   Socioeconomic History  . Marital status: Married    Spouse name: Not on file  . Number of children: Not on file  . Years of education: Not on file  . Highest education level: Not on file  Occupational History  . Occupation: retired Engineer, structural  . Financial resource strain: Not hard at all  . Food insecurity    Worry: Never true    Inability: Never true  . Transportation needs    Medical: No    Non-medical: No  Tobacco Use  . Smoking status: Never Smoker  . Smokeless tobacco: Never Used  Substance and Sexual Activity  . Alcohol use: No  . Drug use: No  . Sexual activity: Not Currently  Lifestyle  . Physical activity    Days per week: 0 days    Minutes per session: 0 min  . Stress: Only a little  Relationships  . Social connections    Talks on phone: More than three times a week    Gets together: More than three times a week    Attends religious service: Never    Active member of club or organization: No    Attends  meetings of clubs or organizations: Never    Relationship status: Married  Other Topics Concern  . Not on file  Social History Narrative   Social History   Diet?    Do you drink/eat things with caffeine? Yes (minimal) coke   Marital status?          married                          What year were you married? 1968   Do you live in a house, apartment, assisted living, condo, trailer, etc.? house   Is it one or more stories? one  How many persons live in your home? 2   Do you have any pets in your home? (please list) none   Highest level of education completed?   Current or past profession: preschool teacher/nanny   Do you exercise?         0                             Type & how often?   Advanced Directives   Do you have a living will?   Do you have a DNR form?                                  If not, do you want to discuss one?   Do you have signed POA/HPOA for forms?    Functional Status   Do you have difficulty bathing or dressing yourself?   Do you have difficulty preparing food or eating?    Do you have difficulty managing your medications?   Do you have difficulty managing your finances?   Do you have difficulty affording your medications?    Admitted to [facility] 05/01/17   Never smoked   Alcohol none   DNR       Outpatient Encounter Medications as of 02/22/2019  Medication Sig  . carvedilol (COREG) 12.5 MG tablet TAKE 1 TABLET (12.5 MG TOTAL) BY MOUTH 2 (TWO) TIMES DAILY WITH A MEAL.  Marland Kitchen donepezil (ARICEPT) 10 MG tablet Take 1 tablet (10 mg total) by mouth at bedtime.  Marland Kitchen ELIQUIS 5 MG TABS tablet TAKE 1 TABLET BY MOUTH TWICE A DAY  . ENTRESTO 24-26 MG TAKE 1 TABLET BY MOUTH TWICE A DAY  . furosemide (LASIX) 80 MG tablet TAKE 1 TABLET BY MOUTH  DAILY  . KLOR-CON M20 20 MEQ tablet TAKE 1 TABLET BY MOUTH EVERY DAY  . letrozole (FEMARA) 2.5 MG tablet TAKE 1 TABLET BY MOUTH  DAILY  . metFORMIN (GLUCOPHAGE-XR) 500 MG 24 hr tablet Take 1 tablet by mouth 2 times daily  with meal. E11.8  . Multiple Vitamins-Minerals (ADULT GUMMY PO) Take 1 tablet by mouth 3 (three) times a week.  . Memantine HCl-Donepezil HCl (NAMZARIC) 28-10 MG CP24 Take 1 capsule by mouth daily. (Patient not taking: Reported on 02/22/2019)  . [DISCONTINUED] spironolactone (ALDACTONE) 25 MG tablet Take 0.5 tablets (12.5 mg total) by mouth daily.   No facility-administered encounter medications on file as of 02/22/2019.     Activities of Daily Living In your present state of health, do you have any difficulty performing the following activities: 02/22/2019  Hearing? Y  Comment wears hearing aids  Vision? Y  Comment follows up with Opthalmology  Difficulty concentrating or making decisions? Y  Comment remmbering and making decisions  Walking or climbing stairs? Y  Comment left leg swelling  Dressing or bathing? Y  Comment Needs assist  Doing errands, shopping? Y  Comment Husband assist  Preparing Food and eating ? Y  Comment husband prepares food  Using the Toilet? Y  Comment sometimes needs assistance with changing diaper  In the past six months, have you accidently leaked urine? Y  Do you have problems with loss of bowel control? Y  Managing your Medications? Y  Managing your Finances? Y  Housekeeping or managing your Housekeeping? Y  Some recent data might be hidden    Patient Care Team: Sherrie Mustache  K, NP as PCP - General (Geriatric Medicine) Nicholas Lose, MD as Consulting Physician (Hematology and Oncology) Eppie Gibson, MD as Attending Physician (Radiation Oncology) Fanny Skates, MD as Consulting Physician (General Surgery)    Assessment:   This is a routine wellness examination for [place].  Exercise Activities and Dietary recommendations Current Exercise Habits: The patient does not participate in regular exercise at present, Exercise limited by: Other - see comments(cognitive impairment)  Goals   None     Fall Risk Fall Risk  02/22/2019 02/22/2019 12/09/2017  11/14/2017 10/10/2017  Falls in the past year? 1 0 No No No  Number falls in past yr: - 0 - - -  Injury with Fall? 0 0 - - -  Risk for fall due to : Impaired vision;Mental status change;Impaired balance/gait;History of fall(s) - - - -  Follow up Falls prevention discussed - - - -   Is the patient's home free of loose throw rugs in walkways, pet beds, electrical cords, etc?   no      Grab bars in the bathroom? yes      Handrails on the stairs?   yes      Adequate lighting?   yes  Depression Screen PHQ 2/9 Scores 02/22/2019 12/09/2017 11/14/2017 10/10/2017  PHQ - 2 Score 0 0 0 0  PHQ- 9 Score - - - -     Cognitive Function MMSE - Mini Mental State Exam 07/30/2017 04/25/2017  Not completed: - Unable to complete  Orientation to time 2 -  Orientation to Place 4 -  Registration 3 -  Attention/ Calculation 5 -  Recall 0 -  Language- name 2 objects 2 -  Language- repeat 1 -  Language- follow 3 step command 3 -  Language- read & follow direction 1 -  Write a sentence 0 -  Copy design 0 -  Total score 21 -     6CIT Screen 02/22/2019  What Year? 4 points  What month? 3 points  What time? 0 points  Count back from 20 0 points  Months in reverse (No Data)  Repeat phrase 10 points    Immunization History  Administered Date(s) Administered  . Influenza Split 08/19/2011  . Influenza Whole 07/01/2007  . Influenza, High Dose Seasonal PF 06/23/2018  . Influenza,inj,Quad PF,6+ Mos 05/20/2017  . PPD Test 05/01/2017  . Pneumococcal Conjugate-13 11/14/2017  . Pneumococcal Polysaccharide-23 02/17/2009, 12/23/2012, 04/29/2017  . Td 08/20/1999, 04/06/2010    Qualifies for Shingles Vaccine? Decline   Screening Tests Health Maintenance  Topic Date Due  . FOOT EXAM  12/22/1955  . COLONOSCOPY  12/22/1994  . DEXA SCAN  12/21/2009  . OPHTHALMOLOGY EXAM  07/04/2018  . HEMOGLOBIN A1C  01/13/2019  . URINE MICROALBUMIN  03/21/2019  . INFLUENZA VACCINE  03/20/2019  . MAMMOGRAM  06/14/2019  .  TETANUS/TDAP  04/06/2020  . Hepatitis C Screening  Completed  . PNA vac Low Risk Adult  Completed    Cancer Screenings: Lung: Low Dose CT Chest recommended if Age 15-80 years, 30 pack-year currently smoking OR have quit w/in 15years. Patient does not qualify. Breast:  Up to date on Mammogram? No  Has bilateral mastectomy  Up to date of Bone Density/Dexa? Yes Colorectal:Has cologuard  Test kit   Additional Screenings: Hepatitis C Screening: Low risk   Plan:  - Referal to Podiatrist for diabetic annual foot exam   I have personally reviewed and noted the following in the patient's chart:   . Medical and social  history . Use of alcohol, tobacco or illicit drugs  . Current medications and supplements . Functional ability and status . Nutritional status . Physical activity . Advanced directives . List of other physicians . Hospitalizations, surgeries, and ER visits in previous 12 months . Vitals . Screenings to include cognitive, depression, and falls . Referrals and appointments  In addition, I have reviewed and discussed with patient certain preventive protocols, quality metrics, and best practice recommendations. A written personalized care plan for preventive services as well as general preventive health recommendations were provided to patient.  Sandrea Hughs, NP  02/22/2019

## 2019-02-22 NOTE — Progress Notes (Signed)
This service is provided via telemedicine  No vital signs collected/recorded due to the encounter was a telemedicine visit.   Location of patient (ex: home, work):  Home  Patient consents to a telephone visit:  Yes   Location of the provider (ex: office, home):  Office   Name of any referring provider:  Sherrie Mustache, NP   Names of all persons participating in the telemedicine service and their role in the encounter:  Leighana Neyman NP, Ruthell Rummage CMA, Roslynn Amble and husband [name]   Time spent on call:  Ruthell Rummage CMA spent 23  miniutes on phone with patient.    Provider: Andree Heeg FNP-C  Lauree Chandler, NP  Patient Care Team: Lauree Chandler, NP as PCP - General (Geriatric Medicine) Nicholas Lose, MD as Consulting Physician (Hematology and Oncology) Eppie Gibson, MD as Attending Physician (Radiation Oncology) Fanny Skates, MD as Consulting Physician (General Surgery)  Extended Emergency Contact Information Primary Emergency Contact: Penaloza,David Address: [place], [place] [address] [address] of [place] Phone: [phone] Mobile Phone: [phone] Relation: Spouse Secondary Emergency Contact: Duncan Dull, [place] [address] [address] of [place] Phone: [phone] Mobile Phone: [phone] Relation: Relative  Goals of care: Advanced Directive information Advanced Directives 02/22/2019  Does Patient Have a Medical Advance Directive? Yes  Type of Paramedic of [place];Living will;Out of facility DNR (pink MOST or yellow form)  Does patient want to make changes to medical advance directive? No - Patient declined  Copy of [place] in Chart? No - copy requested  Would patient like information on creating a medical advance directive? -  Pre-existing out of facility DNR order (yellow form or pink MOST form) -     Chief Complaint  Patient presents with   . Medication Management    7 month follow up   . Medication Management    Memantine 28 - 10 too expensive and not taking would like cheaper generic     HPI:  Pt is a 73 y.o. female seen today for an acute visit to discuss medication.Husband states patient completed previous Memantine sample given by PCP.He states unable to purchase medication due to cost.He states has Delene Loll which is already too expensive but knows patient needs it for her heart.He request generic memantine to be ordered since patient is already taking Aricept 10 mg tablet daily.Also thinks medication is making her have loose stool.No abdominal pain,fever,chills,nausea or vomiting reported.He states patient has no agitation,anxiety, hallucination,delusion,altered mental status or insomnia since she stopped memantine- Donepezil.     Past Medical History:  Diagnosis Date  . Acute diastolic (congestive) heart failure ([place]) 04/25/2017  . Acute respiratory failure with hypoxia and hypercapnia ([place]) 04/25/2017  . ALLERGIC RHINITIS 07/01/2007   Qualifier: Diagnosis of  By: Sherren Mocha MD, Jory Ee   . Allergy   . Aortic insufficiency   . Aortic valve disorder 07/01/2007   Qualifier: Diagnosis of  By: Sherren Mocha MD, Jory Ee   . Arthritis   . Bilateral leg edema 04/25/2017  . Borderline diabetic   . Cancer [facility])    Breast cancer  . CHF (congestive heart failure) ([place]) 04/25/2017  . Dementia ([place] [place]) 04/30/2017  . Diabetes mellitus ([place]) 04/25/2017  . DOE (dyspnea on exertion) 04/25/2017  . Essential hypertension 07/01/2007   Qualifier: Diagnosis of  By: Sherren Mocha MD, Jory Ee Glaucoma  suspect   . Hypertension   . KNEE PAIN, LEFT 04/06/2010   Qualifier: Diagnosis of  By: Sherren Mocha MD, Jory Ee   . Nonischemic cardiomyopathy ([facility])   . Obesity   . Obesity, unspecified 07/01/2007   Qualifier: Diagnosis of  By: Sherren Mocha MD, Jory Ee   . Peripheral vascular disease ([place])    blood clots in legs  . Thyroid disease    Past Surgical History:  Procedure  Laterality Date  . CESAREAN SECTION    . MASTECTOMY  08/27/2017   LEFT BREAST MODIFIED RADICAL MASTECTOMY (Left Breast)  . MASTECTOMY  08/27/2017   RIGHT TOTAL MASTECTOMY WITH SENTINEL LYMPH NODE BIOPSY (Right Breast)  . MASTECTOMY W/ SENTINEL NODE BIOPSY Right 08/27/2017   Procedure: RIGHT TOTAL MASTECTOMY WITH SENTINEL LYMPH NODE BIOPSY;  Surgeon: Fanny Skates, MD;  Location: [facility];  Service: General;  Laterality: Right;  . MODIFIED MASTECTOMY Left 08/27/2017   Procedure: LEFT BREAST MODIFIED RADICAL MASTECTOMY;  Surgeon: Fanny Skates, MD;  Location: [facility];  Service: General;  Laterality: Left;  . PORTACATH PLACEMENT Right 10/09/2017  . PORTACATH PLACEMENT Right 10/09/2017   Procedure: INSERTION PORT-A-CATH WITH ULTRASOUND;  Surgeon: Fanny Skates, MD;  Location: [facility];  Service: General;  Laterality: Right;    No Known Allergies  Outpatient Encounter Medications as of 02/22/2019  Medication Sig  . carvedilol (COREG) 12.5 MG tablet TAKE 1 TABLET (12.5 MG TOTAL) BY MOUTH 2 (TWO) TIMES DAILY WITH A MEAL.  Marland Kitchen donepezil (ARICEPT) 10 MG tablet Take 1 tablet (10 mg total) by mouth at bedtime.  Marland Kitchen ELIQUIS 5 MG TABS tablet TAKE 1 TABLET BY MOUTH TWICE A DAY  . ENTRESTO 24-26 MG TAKE 1 TABLET BY MOUTH TWICE A DAY  . furosemide (LASIX) 80 MG tablet TAKE 1 TABLET BY MOUTH  DAILY  . KLOR-CON M20 20 MEQ tablet TAKE 1 TABLET BY MOUTH EVERY DAY  . letrozole (FEMARA) 2.5 MG tablet TAKE 1 TABLET BY MOUTH  DAILY  . metFORMIN (GLUCOPHAGE-XR) 500 MG 24 hr tablet Take 1 tablet by mouth 2 times daily with meal. E11.8  . Multiple Vitamins-Minerals (ADULT GUMMY PO) Take 1 tablet by mouth 3 (three) times a week.  . Memantine HCl-Donepezil HCl (NAMZARIC) 28-10 MG CP24 Take 1 capsule by mouth daily. (Patient not taking: Reported on 02/22/2019)  . [DISCONTINUED] spironolactone (ALDACTONE) 25 MG tablet Take 0.5 tablets (12.5 mg total) by mouth daily.   No facility-administered encounter medications on file as of  02/22/2019.     Review of Systems  Constitutional: Negative for appetite change, chills, fatigue and fever.  HENT: Negative for congestion, rhinorrhea, sinus pressure, sinus pain, sneezing and sore throat.   Respiratory: Negative for cough, chest tightness, shortness of breath and wheezing.   Cardiovascular: Negative for chest pain, palpitations and leg swelling.  Gastrointestinal: Negative for abdominal distention, abdominal pain, constipation, nausea and vomiting.       Loose stool   Skin: Negative for color change, pallor and rash.  Neurological: Negative for dizziness, syncope, light-headedness and headaches.  Psychiatric/Behavioral: Positive for confusion. Negative for agitation, behavioral problems, hallucinations and sleep disturbance. The patient is not nervous/anxious.        Dementia     Immunization History  Administered Date(s) Administered  . Influenza Split 08/19/2011  . Influenza Whole 07/01/2007  . Influenza, High Dose Seasonal PF 06/23/2018  . Influenza,inj,Quad PF,6+ Mos 05/20/2017  . PPD Test 05/01/2017  . Pneumococcal Conjugate-13 11/14/2017  . Pneumococcal Polysaccharide-23 02/17/2009, 12/23/2012, 04/29/2017  .  Td 08/20/1999, 04/06/2010   Pertinent  Health Maintenance Due  Topic Date Due  . FOOT EXAM  [birth year]1954  . COLONOSCOPY  12/22/1994  . DEXA SCAN  12/21/2009  . OPHTHALMOLOGY EXAM  07/04/2018  . HEMOGLOBIN A1C  01/13/2019  . URINE MICROALBUMIN  03/21/2019  . INFLUENZA VACCINE  03/20/2019  . MAMMOGRAM  06/14/2019  . PNA vac Low Risk Adult  Completed   Fall Risk  02/22/2019 02/22/2019 12/09/2017 11/14/2017 10/10/2017  Falls in the past year? 1 0 No No No  Number falls in past yr: - 0 - - -  Injury with Fall? 0 0 - - -  Risk for fall due to : Impaired vision;Mental status change;Impaired balance/gait;History of fall(s) - - - -  Follow up Falls prevention discussed - - - -    There were no vitals filed for this visit. There is no height or weight on file to  calculate BMI. Physical Exam  Unable to complete on telephone visit   Labs reviewed: Recent Labs    08/03/18 0925 08/20/18 1042 09/14/18 0908  NA 144 141 143  K 3.8 4.6 3.8  CL 108 109 108  CO2 22 19* 23  GLUCOSE 189* 204* 257*  BUN 19 20 20   CREATININE 0.80 0.79 0.83  CALCIUM 9.1 9.4 9.1   Recent Labs    06/23/18 1026 08/03/18 0925 09/14/18 0908  AST 10* 9* 9*  ALT 9 10 10   ALKPHOS 77 72 67  BILITOT 0.5 0.4 0.5  PROT 6.5 6.5 6.8  ALBUMIN 3.7 3.7 3.7   Recent Labs    06/23/18 1026 08/03/18 0925 09/14/18 0908  WBC 3.9* 4.0 6.9  NEUTROABS 3.0 2.9 6.0  HGB 12.3 12.4 12.7  HCT 37.1 37.4 37.8  MCV 98.4 97.1 97.2  PLT 153 166 161   Lab Results  Component Value Date   TSH 4.03 05/09/2017   Lab Results  Component Value Date   HGBA1C 6.4 (H) 07/15/2018   Lab Results  Component Value Date   CHOL 148 07/15/2018   HDL 52 07/15/2018   LDLCALC 75 07/15/2018   TRIG 131 07/15/2018   CHOLHDL 2.8 07/15/2018    Significant Diagnostic Results in last 30 days:  No results found.  Assessment/Plan 1. Diarrhea, unspecified type Reports no fever or chills.No abdominal pain or bloating.suspect possible medication related.On Metformin ER 500 mg tablet twice daily and also Aricept 10 mg tablet and Memantine- Donepezil  28-10 mg tablet daily.Will discontinue Memantine- Donepezil due to cost.scheduled for recheck HJgb A1C then will adjust metformin as needed.    2. Dementia without behavioral disturbance, unspecified dementia type ([place]) No new behavorial issues reported though progressive decline in memory reported by husband.will discontinue Memantine- Donepezil  28-10 mg tablet daily per husband's request due to cost.Patient has been off medication since samples was completed.continue on Aricept 10 mg tablet daily.  - Add Namenda 5 mg tablet one by mouth daily x 1 week then take 5 mg tablet one by mouth twice daily x 1 week then 10 mg tablet one by mouth twice daily.     Family/ staff Communication: Reviewed plan of care with patient and Husband.  Labs/tests ordered: None   Time spent with patient 27 minutes >50% time spent counseling; reviewing medical record; tests; labs; and developing future plan of care   Sandrea Hughs, NP

## 2019-02-22 NOTE — Patient Instructions (Signed)
Charlene Rubio , Thank you for taking time to come for your Medicare Wellness Visit. I appreciate your ongoing commitment to your health goals. Please review the following plan we discussed and let me know if I can assist you in the future.   Screening recommendations/referrals: Colonoscopy: Please send cologuard specimen  Mammogram N/A  Bone Density: Up to date  Recommended yearly ophthalmology/optometry visit for glaucoma screening and checkup Recommended yearly dental visit for hygiene and checkup  Vaccinations: Influenza vaccine  Up to date Pneumococcal vaccine  Up to date Tdap vaccine  Up to date Shingles vaccine: None   Advanced directives: Yes   Conditions/risks identified: Advance age female > 39 yrs   Next appointment: 1 year    Preventive Care 74 Years and Older, Female, Female Preventive care refers to lifestyle choices and visits with your health care provider that can promote health and wellness. What does preventive care include?  A yearly physical exam. This is also called an annual well check.  Dental exams once or twice a year.  Routine eye exams. Ask your health care provider how often you should have your eyes checked.  Personal lifestyle choices, including:  Daily care of your teeth and gums.  Regular physical activity.  Eating a healthy diet.  Avoiding tobacco and drug use.  Limiting alcohol use.  Practicing safe sex.  Taking low-dose aspirin every day.  Taking vitamin and mineral supplements as recommended by your health care provider. What happens during an annual well check? The services and screenings done by your health care provider during your annual well check will depend on your age, overall health, lifestyle risk factors, and family history of disease. Counseling  Your health care provider may ask you questions about your:  Alcohol use.  Tobacco use.  Drug use.  Emotional well-being.  Home and relationship well-being.  Sexual  activity.  Eating habits.  History of falls.  Memory and ability to understand (cognition).  Work and work Statistician.  Reproductive health. Screening  You may have the following tests or measurements:  Height, weight, and BMI.  Blood pressure.  Lipid and cholesterol levels. These may be checked every 5 years, or more frequently if you are over 75 years old.  Skin check.  Lung cancer screening. You may have this screening every year starting at age 74 if you have a 30-pack-year history of smoking and currently smoke or have quit within the past 15 years.  Fecal occult blood test (FOBT) of the stool. You may have this test every year starting at age 74.  Flexible sigmoidoscopy or colonoscopy. You may have a sigmoidoscopy every 5 years or a colonoscopy every 10 years starting at age 74.  Hepatitis C blood test.  Hepatitis B blood test.  Sexually transmitted disease (STD) testing.  Diabetes screening. This is done by checking your blood sugar (glucose) after you have not eaten for a while (fasting). You may have this done every 1-3 years.  Bone density scan. This is done to screen for osteoporosis. You may have this done starting at age 74.  Mammogram. This may be done every 1-2 years. Talk to your health care provider about how often you should have regular mammograms. Talk with your health care provider about your test results, treatment options, and if necessary, the need for more tests. Vaccines  Your health care provider may recommend certain vaccines, such as:  Influenza vaccine. This is recommended every year.  Tetanus, diphtheria, and acellular pertussis (Tdap, Td) vaccine. You  may need a Td booster every 10 years.  Zoster vaccine. You may need this after age 74.  Pneumococcal 13-valent conjugate (PCV13) vaccine. One dose is recommended after age 74.  Pneumococcal polysaccharide (PPSV23) vaccine. One dose is recommended after age 74. Talk to your health care  provider about which screenings and vaccines you need and how often you need them. This information is not intended to replace advice given to you by your health care provider. Make sure you discuss any questions you have with your health care provider. Document Released: 09/01/2015 Document Revised: 04/24/2016 Document Reviewed: 06/06/2015 Elsevier Interactive Patient Education  2017 [employer] Prevention in the Home Falls can cause injuries. They can happen to people of all ages. There are many things you can do to make your home safe and to help prevent falls. What can I do on the outside of my home?  Regularly fix the edges of walkways and driveways and fix any cracks.  Remove anything that might make you trip as you walk through a door, such as a raised step or threshold.  Trim any bushes or trees on the path to your home.  Use bright outdoor lighting.  Clear any walking paths of anything that might make someone trip, such as rocks or tools.  Regularly check to see if handrails are loose or broken. Make sure that both sides of any steps have handrails.  Any raised decks and porches should have guardrails on the edges.  Have any leaves, snow, or ice cleared regularly.  Use sand or salt on walking paths during winter.  Clean up any spills in your garage right away. This includes oil or grease spills. What can I do in the bathroom?  Use night lights.  Install grab bars by the toilet and in the tub and shower. Do not use towel bars as grab bars.  Use non-skid mats or decals in the tub or shower.  If you need to sit down in the shower, use a plastic, non-slip stool.  Keep the floor dry. Clean up any water that spills on the floor as soon as it happens.  Remove soap buildup in the tub or shower regularly.  Attach bath mats securely with double-sided non-slip rug tape.  Do not have throw rugs and other things on the floor that can make you trip. What can I do in  the bedroom?  Use night lights.  Make sure that you have a light by your bed that is easy to reach.  Do not use any sheets or blankets that are too big for your bed. They should not hang down onto the floor.  Have a firm chair that has side arms. You can use this for support while you get dressed.  Do not have throw rugs and other things on the floor that can make you trip. What can I do in the kitchen?  Clean up any spills right away.  Avoid walking on wet floors.  Keep items that you use a lot in easy-to-reach places.  If you need to reach something above you, use a strong step stool that has a grab bar.  Keep electrical cords out of the way.  Do not use floor polish or wax that makes floors slippery. If you must use wax, use non-skid floor wax.  Do not have throw rugs and other things on the floor that can make you trip. What can I do with my stairs?  Do not leave any items  on the stairs.  Make sure that there are handrails on both sides of the stairs and use them. Fix handrails that are broken or loose. Make sure that handrails are as long as the stairways.  Check any carpeting to make sure that it is firmly attached to the stairs. Fix any carpet that is loose or worn.  Avoid having throw rugs at the top or bottom of the stairs. If you do have throw rugs, attach them to the floor with carpet tape.  Make sure that you have a light switch at the top of the stairs and the bottom of the stairs. If you do not have them, ask someone to add them for you. What else can I do to help prevent falls?  Wear shoes that:  Do not have high heels.  Have rubber bottoms.  Are comfortable and fit you well.  Are closed at the toe. Do not wear sandals.  If you use a stepladder:  Make sure that it is fully opened. Do not climb a closed stepladder.  Make sure that both sides of the stepladder are locked into place.  Ask someone to hold it for you, if possible.  Clearly mark and  make sure that you can see:  Any grab bars or handrails.  First and last steps.  Where the edge of each step is.  Use tools that help you move around (mobility aids) if they are needed. These include:  Canes.  Walkers.  Scooters.  Crutches.  Turn on the lights when you go into a dark area. Replace any light bulbs as soon as they burn out.  Set up your furniture so you have a clear path. Avoid moving your furniture around.  If any of your floors are uneven, fix them.  If there are any pets around you, be aware of where they are.  Review your medicines with your doctor. Some medicines can make you feel dizzy. This can increase your chance of falling. Ask your doctor what other things that you can do to help prevent falls. This information is not intended to replace advice given to you by your health care provider. Make sure you discuss any questions you have with your health care provider. Document Released: 06/01/2009 Document Revised: 01/11/2016 Document Reviewed: 09/09/2014 Elsevier Interactive Patient Education  2017 [employer].

## 2019-02-23 ENCOUNTER — Other Ambulatory Visit: Payer: Self-pay | Admitting: Family

## 2019-02-23 DIAGNOSIS — I5042 Chronic combined systolic (congestive) and diastolic (congestive) heart failure: Secondary | ICD-10-CM

## 2019-02-23 DIAGNOSIS — E118 Type 2 diabetes mellitus with unspecified complications: Secondary | ICD-10-CM

## 2019-02-23 DIAGNOSIS — I10 Essential (primary) hypertension: Secondary | ICD-10-CM

## 2019-02-23 MED ORDER — MEMANTINE HCL 5 MG PO TABS: 5.0000 mg | ORAL_TABLET | Freq: Every day | ORAL | 0 refills | Status: DC

## 2019-03-04 ENCOUNTER — Other Ambulatory Visit: Payer: Self-pay | Admitting: Internal Medicine

## 2019-03-04 DIAGNOSIS — R413 Other amnesia: Secondary | ICD-10-CM

## 2019-03-08 ENCOUNTER — Other Ambulatory Visit: Payer: Self-pay

## 2019-03-08 ENCOUNTER — Other Ambulatory Visit: Payer: Medicare Other

## 2019-03-08 DIAGNOSIS — E118 Type 2 diabetes mellitus with unspecified complications: Secondary | ICD-10-CM

## 2019-03-08 DIAGNOSIS — I5042 Chronic combined systolic (congestive) and diastolic (congestive) heart failure: Secondary | ICD-10-CM

## 2019-03-08 DIAGNOSIS — I10 Essential (primary) hypertension: Secondary | ICD-10-CM | POA: Diagnosis not present

## 2019-03-09 LAB — CBC WITH DIFFERENTIAL/PLATELET
Absolute Monocytes: 467 cells/uL (ref 200–950)
Basophils Absolute: 51 cells/uL (ref 0–200)
Basophils Relative: 0.8 %
Eosinophils Absolute: 141 cells/uL (ref 15–500)
Eosinophils Relative: 2.2 %
HCT: 41.4 % (ref 35.0–45.0)
Hemoglobin: 14 g/dL (ref 11.7–15.5)
Lymphs Abs: 736 cells/uL — ABNORMAL LOW (ref 850–3900)
MCH: 32.6 pg (ref 27.0–33.0)
MCHC: 33.8 g/dL (ref 32.0–36.0)
MCV: 96.5 fL (ref 80.0–100.0)
MPV: 11.3 fL (ref 7.5–12.5)
Monocytes Relative: 7.3 %
Neutro Abs: 5005 cells/uL (ref 1500–7800)
Neutrophils Relative %: 78.2 %
Platelets: 207 10*3/uL (ref 140–400)
RBC: 4.29 10*6/uL (ref 3.80–5.10)
RDW: 12.6 % (ref 11.0–15.0)
Total Lymphocyte: 11.5 %
WBC: 6.4 10*3/uL (ref 3.8–10.8)

## 2019-03-09 LAB — COMPLETE METABOLIC PANEL WITH GFR
AG Ratio: 1.5 (calc) (ref 1.0–2.5)
ALT: 10 U/L (ref 6–29)
AST: 11 U/L (ref 10–35)
Albumin: 3.9 g/dL (ref 3.6–5.1)
Alkaline phosphatase (APISO): 75 U/L (ref 37–153)
BUN: 18 mg/dL (ref 7–25)
CO2: 24 mmol/L (ref 20–32)
Calcium: 9.5 mg/dL (ref 8.6–10.4)
Chloride: 103 mmol/L (ref 98–110)
Creat: 0.86 mg/dL (ref 0.60–0.93)
GFR, Est African American: 77 mL/min/{1.73_m2} (ref >=60)
GFR, Est Non African American: 67 mL/min/{1.73_m2} (ref >=60)
Globulin: 2.6 g/dL (calc) (ref 1.9–3.7)
Glucose, Bld: 318 mg/dL — ABNORMAL HIGH (ref 65–99)
Potassium: 4.5 mmol/L (ref 3.5–5.3)
Sodium: 137 mmol/L (ref 135–146)
Total Bilirubin: 0.6 mg/dL (ref 0.2–1.2)
Total Protein: 6.5 g/dL (ref 6.1–8.1)

## 2019-03-09 LAB — TSH: TSH: 1.86 mIU/L (ref 0.40–4.50)

## 2019-03-09 LAB — HEMOGLOBIN A1C
Hgb A1c MFr Bld: 11.1 % of total Hgb — ABNORMAL HIGH (ref <5.7)
Mean Plasma Glucose: 272 (calc)
eAG (mmol/L): 15.1 (calc)

## 2019-03-10 ENCOUNTER — Telehealth: Payer: Self-pay | Admitting: Nurse Practitioner

## 2019-03-10 NOTE — Telephone Encounter (Signed)
Refer to lab result note by Dinah on 03/09/2019 sent to result pool.   Please schedule office visit to discuss lab results with Charlene Oats NP or me.  Tried calling patient to schedule an appointment with Charlene Rubio or Dinah. LMOM. Informed patient they could speak to anyone at office to schedule an appointment.

## 2019-03-10 NOTE — Telephone Encounter (Signed)
Patient's husband stated that Melissa called regarding lab results.  Will you please call me with the results?

## 2019-03-11 ENCOUNTER — Encounter: Payer: Self-pay | Admitting: Family

## 2019-03-11 ENCOUNTER — Ambulatory Visit (INDEPENDENT_AMBULATORY_CARE_PROVIDER_SITE_OTHER): Payer: Medicare Other | Admitting: Family

## 2019-03-11 ENCOUNTER — Other Ambulatory Visit: Payer: Self-pay

## 2019-03-11 VITALS — BP 118/70 | HR 74 | Temp 98.2°F | Ht 66.0 in | Wt 176.0 lb

## 2019-03-11 DIAGNOSIS — E118 Type 2 diabetes mellitus with unspecified complications: Secondary | ICD-10-CM

## 2019-03-11 DIAGNOSIS — I10 Essential (primary) hypertension: Secondary | ICD-10-CM | POA: Diagnosis not present

## 2019-03-11 DIAGNOSIS — I5042 Chronic combined systolic (congestive) and diastolic (congestive) heart failure: Secondary | ICD-10-CM | POA: Diagnosis not present

## 2019-03-11 MED ORDER — GLUCOSE BLOOD VI STRP: ORAL_STRIP | 3 refills | Status: DC

## 2019-03-11 MED ORDER — ONETOUCH ULTRA 2 W/DEVICE KIT: PACK | 0 refills | Status: AC

## 2019-03-11 MED ORDER — ONETOUCH ULTRA VI STRP: ORAL_STRIP | 0 refills | Status: DC

## 2019-03-11 MED ORDER — LANTUS SOLOSTAR 100 UNIT/ML ~~LOC~~ SOPN: 10.0000 [IU] | PEN_INJECTOR | Freq: Every day | SUBCUTANEOUS | 99 refills | Status: DC

## 2019-03-11 MED ORDER — BLOOD GLUCOSE MONITOR KIT: 1.0000 | PACK | Freq: Every day | 0 refills | Status: DC | PRN

## 2019-03-11 MED ORDER — ONETOUCH DELICA LANCETS 33G MISC: 0 refills | Status: DC

## 2019-03-11 MED ORDER — MEMANTINE HCL 10 MG PO TABS: 5.0000 mg | ORAL_TABLET | Freq: Two times a day (BID) | ORAL | 0 refills | Status: DC

## 2019-03-11 MED ORDER — LANCETS MISC: 3 refills | Status: DC

## 2019-03-11 NOTE — Progress Notes (Signed)
Provider: Dinah Ngetich FNP-C  Lauree Chandler, NP  Patient Care Team: Lauree Chandler, NP as PCP - General (Geriatric Medicine) Nicholas Lose, MD as Consulting Physician (Hematology and Oncology) Eppie Gibson, MD as Attending Physician (Radiation Oncology) Fanny Skates, MD as Consulting Physician (General Surgery)  Extended Emergency Contact Information Primary Emergency Contact: Osgood,David Address: [place], [place] [address] [address] of [place] Phone: [phone] Mobile Phone: [phone] Relation: Spouse Secondary Emergency Contact: Duncan Dull, [place] [address] [address] of [place] Phone: [phone] Mobile Phone: [phone] Relation: Relative  Code Status:  Goals of care: Advanced Directive information Advanced Directives 02/22/2019  Does Patient Have a Medical Advance Directive? Yes  Type of Paramedic of [place];Living will;Out of facility DNR (pink MOST or yellow form)  Does patient want to make changes to medical advance directive? No - Patient declined  Copy of [place] in Chart? No - copy requested  Would patient like information on creating a medical advance directive? -  Pre-existing out of facility DNR order (yellow form or pink MOST form) -     Chief Complaint  Patient presents with  . Acute Visit    Discuss Abnormal Labs patient's husband states patient she has been more thirsty and having diarrhea more than 3 weeks now   . Medical Management of Chronic Issues    would like to discuss namenda 5 mg tablet     HPI:  Pt is a 73 y.o. female seen today for an acute visit for evaluation of abnormal labs.she is here escorted by Husband who is the care giver.Husband states patient has had increased thirsty and loose stool for more than 3 weeks.He would also like refills for namenda.Patient unable to provide HPI information due to her cognitive  impairment but smiles and appreciative during visit.Her recent lab results reviewed with husband and patient.CBc showed no signs of infections or anemia.TSH level ,Electrolytes,renal and liver functions are within normal range.Glucose 318,Hgb A1C was 11.1 (03/08/2019).Husband states they have been eating more sandwiches,drinks orange juice and eats ice cream.He states aware of heart healthy diet since he had open heart surgery and was taught at the heart Rehab.He will try to change diet for patient.Patient unable to exercise due to her cognitive impairment and has problems with her left knee.she has had no fever,chills,cough or symptoms of UTI's.    Past Medical History:  Diagnosis Date  . Acute diastolic (congestive) heart failure ([place]) 04/25/2017  . Acute respiratory failure with hypoxia and hypercapnia ([place]) 04/25/2017  . ALLERGIC RHINITIS 07/01/2007   Qualifier: Diagnosis of  By: Sherren Mocha MD, Jory Ee   . Allergy   . Aortic insufficiency   . Aortic valve disorder 07/01/2007   Qualifier: Diagnosis of  By: Sherren Mocha MD, Jory Ee   . Arthritis   . Bilateral leg edema 04/25/2017  . Borderline diabetic   . Cancer [facility])    Breast cancer  . CHF (congestive heart failure) ([place]) 04/25/2017  . Dementia ([place]) 04/30/2017  . Diabetes mellitus ([place]) 04/25/2017  . DOE (dyspnea on exertion) 04/25/2017  . Essential hypertension 07/01/2007   Qualifier: Diagnosis of  By: Sherren Mocha MD, Jory Ee Glaucoma suspect   . Hypertension   . KNEE PAIN, LEFT 04/06/2010   Qualifier: Diagnosis of  By: Sherren Mocha MD, Jory Ee   . Nonischemic cardiomyopathy ([place])   .  Obesity   . Obesity, unspecified 07/01/2007   Qualifier: Diagnosis of  By: Sherren Mocha MD, Jory Ee   . Peripheral vascular disease ([place])    blood clots in legs  . Thyroid disease    Past Surgical History:  Procedure Laterality Date  . CESAREAN SECTION    . MASTECTOMY  08/27/2017   LEFT BREAST MODIFIED RADICAL MASTECTOMY (Left Breast)  . MASTECTOMY  08/27/2017   RIGHT TOTAL  MASTECTOMY WITH SENTINEL LYMPH NODE BIOPSY (Right Breast)  . MASTECTOMY W/ SENTINEL NODE BIOPSY Right 08/27/2017   Procedure: RIGHT TOTAL MASTECTOMY WITH SENTINEL LYMPH NODE BIOPSY;  Surgeon: Fanny Skates, MD;  Location: [facility];  Service: General;  Laterality: Right;  . MODIFIED MASTECTOMY Left 08/27/2017   Procedure: LEFT BREAST MODIFIED RADICAL MASTECTOMY;  Surgeon: Fanny Skates, MD;  Location: [facility];  Service: General;  Laterality: Left;  . PORTACATH PLACEMENT Right 10/09/2017  . PORTACATH PLACEMENT Right 10/09/2017   Procedure: INSERTION PORT-A-CATH WITH ULTRASOUND;  Surgeon: Fanny Skates, MD;  Location: [facility];  Service: General;  Laterality: Right;    No Known Allergies  Outpatient Encounter Medications as of 03/11/2019  Medication Sig  . carvedilol (COREG) 12.5 MG tablet TAKE 1 TABLET (12.5 MG TOTAL) BY MOUTH 2 (TWO) TIMES DAILY WITH A MEAL.  Marland Kitchen donepezil (ARICEPT) 10 MG tablet TAKE 1 TABLET BY MOUTH AT  BEDTIME  . ELIQUIS 5 MG TABS tablet TAKE 1 TABLET BY MOUTH TWICE A DAY  . ENTRESTO 24-26 MG TAKE 1 TABLET BY MOUTH TWICE A DAY  . furosemide (LASIX) 80 MG tablet TAKE 1 TABLET BY MOUTH  DAILY  . KLOR-CON M20 20 MEQ tablet TAKE 1 TABLET BY MOUTH EVERY DAY  . letrozole (FEMARA) 2.5 MG tablet TAKE 1 TABLET BY MOUTH  DAILY  . metFORMIN (GLUCOPHAGE-XR) 500 MG 24 hr tablet Take 1 tablet by mouth 2 times daily with meal. E11.8  . Multiple Vitamins-Minerals (ADULT GUMMY PO) Take 1 tablet by mouth 3 (three) times a week.  . memantine (NAMENDA) 5 MG tablet Take 1 tablet (5 mg total) by mouth daily for 7 days. Then take 5 mg tablet one by mouth twice daily x 1 week then  10 mg tablet one by mouth twice daily.  . Memantine HCl-Donepezil HCl (NAMZARIC) 28-10 MG CP24 Take 1 capsule by mouth daily. (Patient not taking: Reported on 03/11/2019)   No facility-administered encounter medications on file as of 03/11/2019.     Review of Systems  Constitutional: Negative for appetite change, chills,  diaphoresis, fatigue and fever.  HENT: Negative for congestion, rhinorrhea, sinus pressure, sinus pain, sneezing and sore throat.   Eyes: Negative for discharge, redness, itching and visual disturbance.  Respiratory: Negative for cough, chest tightness, shortness of breath and wheezing.   Cardiovascular: Negative for chest pain, palpitations and leg swelling.  Gastrointestinal: Negative for abdominal distention, abdominal pain, constipation, diarrhea, nausea and vomiting.  Endocrine: Positive for polydipsia and polyuria. Negative for cold intolerance, heat intolerance and polyphagia.  Genitourinary: Positive for frequency. Negative for difficulty urinating, dysuria, flank pain and urgency.  Musculoskeletal: Positive for arthralgias and gait problem.       Left knee pain   Skin: Negative for color change, pallor and rash.  Psychiatric/Behavioral: Positive for confusion. Negative for agitation, hallucinations and sleep disturbance. The patient is not nervous/anxious.     Immunization History  Administered Date(s) Administered  . Influenza Split 08/19/2011  . Influenza Whole 07/01/2007  . Influenza, High Dose Seasonal PF 06/23/2018  . Influenza,inj,Quad  PF,6+ Mos 05/20/2017  . PPD Test 05/01/2017  . Pneumococcal Conjugate-13 11/14/2017  . Pneumococcal Polysaccharide-23 02/17/2009, 12/23/2012, 04/29/2017  . Td 08/20/1999, 04/06/2010   Pertinent  Health Maintenance Due  Topic Date Due  . FOOT EXAM  12/22/1954  . COLONOSCOPY  12/22/1994  . DEXA SCAN  12/21/2009  . OPHTHALMOLOGY EXAM  07/04/2018  . URINE MICROALBUMIN  03/21/2019  . INFLUENZA VACCINE  03/20/2019  . MAMMOGRAM  06/14/2019  . HEMOGLOBIN A1C  09/08/2019  . PNA vac Low Risk Adult  Completed   Fall Risk  03/11/2019 02/22/2019 02/22/2019 12/09/2017 11/14/2017  Falls in the past year? 0 1 0 No No  Number falls in past yr: 0 - 0 - -  Injury with Fall? 0 0 0 - -  Risk for fall due to : - Impaired vision;Mental status change;Impaired  balance/gait;History of fall(s) - - -  Follow up - Falls prevention discussed - - -    Vitals:   03/11/19 1332  BP: 118/70  Pulse: 74  Temp: 98.2 F (36.8 C)  TempSrc: Oral  SpO2: 98%  Weight: 167 lb (75.8 kg)  Height: 5\' 6"  (1.676 m)   Body mass index is 26.95 kg/m. Physical Exam Vitals signs reviewed.  Constitutional:      General: She is not in acute distress.    Appearance: She is overweight. She is not ill-appearing.  HENT:     Head: Normocephalic.     Nose: No congestion or rhinorrhea.     Mouth/Throat:     Mouth: Mucous membranes are moist.     Pharynx: Oropharynx is clear. No oropharyngeal exudate or posterior oropharyngeal erythema.  Eyes:     General: No scleral icterus.       Right eye: No discharge.        Left eye: No discharge.     Conjunctiva/sclera: Conjunctivae normal.     Pupils: Pupils are equal, round, and reactive to light.  Neck:     Musculoskeletal: Normal range of motion. No neck rigidity or muscular tenderness.  Cardiovascular:     Rate and Rhythm: Normal rate and regular rhythm.     Pulses: Normal pulses.     Heart sounds: Murmur present. No friction rub. No gallop.   Pulmonary:     Effort: Pulmonary effort is normal. No respiratory distress.     Breath sounds: Normal breath sounds. No wheezing, rhonchi or rales.  Chest:     Chest wall: No tenderness.  Abdominal:     General: Bowel sounds are normal. There is no distension.     Palpations: Abdomen is soft. There is no mass.     Tenderness: There is no abdominal tenderness. There is no right CVA tenderness, left CVA tenderness, guarding or rebound.  Musculoskeletal:        General: No swelling or tenderness.     Comments: Unsteady gait ambulates with assist has walker but forgets to use it.bilateral lower extremities 1-2 + edema.   Lymphadenopathy:     Cervical: No cervical adenopathy.  Skin:    General: Skin is warm and dry.     Coloration: Skin is not pale.     Findings: No erythema  or rash.  Neurological:     Mental Status: She is alert. Mental status is at baseline.     Cranial Nerves: No cranial nerve deficit.     Sensory: No sensory deficit.     Motor: No weakness.     Gait: Gait abnormal.  Psychiatric:  Mood and Affect: Mood normal.        Speech: Speech normal.        Behavior: Behavior is cooperative.        Thought Content: Thought content normal.        Cognition and Memory: Memory is impaired.    Labs reviewed: Recent Labs    08/20/18 1042 09/14/18 0908 03/08/19 0933  NA 141 143 137  K 4.6 3.8 4.5  CL 109 108 103  CO2 19* 23 24  GLUCOSE 204* 257* 318*  BUN 20 20 18   CREATININE 0.79 0.83 0.86  CALCIUM 9.4 9.1 9.5   Recent Labs    06/23/18 1026 08/03/18 0925 09/14/18 0908 03/08/19 0933  AST 10* 9* 9* 11  ALT 9 10 10 10   ALKPHOS 77 72 67  --   BILITOT 0.5 0.4 0.5 0.6  PROT 6.5 6.5 6.8 6.5  ALBUMIN 3.7 3.7 3.7  --    Recent Labs    08/03/18 0925 09/14/18 0908 03/08/19 0933  WBC 4.0 6.9 6.4  NEUTROABS 2.9 6.0 5,005  HGB 12.4 12.7 14.0  HCT 37.4 37.8 41.4  MCV 97.1 97.2 96.5  PLT 166 161 207   Lab Results  Component Value Date   TSH 1.86 03/08/2019   Lab Results  Component Value Date   HGBA1C 11.1 (H) 03/08/2019   Lab Results  Component Value Date   CHOL 148 07/15/2018   HDL 52 07/15/2018   LDLCALC 75 07/15/2018   TRIG 131 07/15/2018   CHOLHDL 2.8 07/15/2018    Significant Diagnostic Results in last 30 days:  No results found.  Assessment/Plan 1. Type 2 diabetes mellitus with complication, without long-term current use of insulin (HCC) Lab Results  Component Value Date   HGBA1C 11.1 (H) 03/08/2019  No CBG for review.Dietary modification discussed with patient and husband.Husband is in charge of meals states will be making changes.Patient unable to exercise due to unsteady gait due to left knee deformity. - continue on metformin 500 mg tablet daily. - start on Lantus 10 units SQ at bedtime.signs of  Hypo/Hyperglycemia discussed with patient's Husband.May given orange juice if CBG < 75  - Ambulatory referral to [facility] for Nurse to educate on lantus SQ administration and diabetic teaching. Diabetic glucometer,lancet and strips ordered. 2. Essential hypertension B/p stable.continue on coreg 12.5 mg tablet twice daily and Furosemide 80 mg tablet daily.Encouraged to monitor blood pressure daily at home. Entresto 24-26 twice daily.  3. Chronic combined systolic and diastolic congestive heart failure ([place]) Has had 6 lbs weight gain over 7 months.Bilateral lower extremities 1-2+ edema.No cough,shortness of breath or wheezing on exam.continue on coreg 12.5 mg tablet twice daily, Furosemide 80 mg tablet daily and Entresto 24-26 twice daily.Husband unable to check her weight at home " difficulties getting on the scale".   Family/ staff Communication: Reviewed plan of care with patient and facility Nurse supervisor  Labs/tests ordered: None   Next appt: 05/17/2019   Sandrea Hughs, NP

## 2019-03-11 NOTE — Patient Instructions (Signed)
1.check blood sugars twice daily in the morning and at bedtime  2. Start on Lantus inject 10 units SQ at bedtime.  3.cut down on concentrated sweets and sugary foods/drinks or snacks.

## 2019-03-19 ENCOUNTER — Telehealth: Payer: Self-pay | Admitting: Nurse Practitioner

## 2019-03-19 NOTE — Telephone Encounter (Signed)
Thank You.

## 2019-03-19 NOTE — Telephone Encounter (Signed)
Rhonda with [facility] called.  They will begin services for patient on 03/22/19.

## 2019-03-21 DIAGNOSIS — Z794 Long term (current) use of insulin: Secondary | ICD-10-CM

## 2019-03-21 DIAGNOSIS — I351 Nonrheumatic aortic (valve) insufficiency: Secondary | ICD-10-CM

## 2019-03-21 DIAGNOSIS — I5042 Chronic combined systolic (congestive) and diastolic (congestive) heart failure: Secondary | ICD-10-CM | POA: Diagnosis not present

## 2019-03-21 DIAGNOSIS — M1990 Unspecified osteoarthritis, unspecified site: Secondary | ICD-10-CM

## 2019-03-21 DIAGNOSIS — Z9013 Acquired absence of bilateral breasts and nipples: Secondary | ICD-10-CM | POA: Diagnosis not present

## 2019-03-21 DIAGNOSIS — I82522 Chronic embolism and thrombosis of left iliac vein: Secondary | ICD-10-CM

## 2019-03-21 DIAGNOSIS — I825Z2 Chronic embolism and thrombosis of unspecified deep veins of left distal lower extremity: Secondary | ICD-10-CM | POA: Diagnosis not present

## 2019-03-21 DIAGNOSIS — Z7901 Long term (current) use of anticoagulants: Secondary | ICD-10-CM | POA: Diagnosis not present

## 2019-03-21 DIAGNOSIS — I110 Hypertensive heart disease with heart failure: Secondary | ICD-10-CM | POA: Diagnosis not present

## 2019-03-21 DIAGNOSIS — Z853 Personal history of malignant neoplasm of breast: Secondary | ICD-10-CM | POA: Diagnosis not present

## 2019-03-21 DIAGNOSIS — E1151 Type 2 diabetes mellitus with diabetic peripheral angiopathy without gangrene: Secondary | ICD-10-CM | POA: Diagnosis not present

## 2019-03-21 DIAGNOSIS — F0390 Unspecified dementia without behavioral disturbance: Secondary | ICD-10-CM | POA: Diagnosis not present

## 2019-03-22 ENCOUNTER — Other Ambulatory Visit (HOSPITAL_COMMUNITY): Payer: Self-pay | Admitting: Cardiology

## 2019-03-22 ENCOUNTER — Other Ambulatory Visit: Payer: Self-pay | Admitting: *Deleted

## 2019-03-22 MED ORDER — PEN NEEDLES 31G X 5 MM MISC: 1.0000 | Freq: Two times a day (BID) | 1 refills | Status: DC

## 2019-03-22 NOTE — Telephone Encounter (Signed)
[facility] [employer]

## 2019-03-23 DIAGNOSIS — Z7901 Long term (current) use of anticoagulants: Secondary | ICD-10-CM | POA: Diagnosis not present

## 2019-03-23 DIAGNOSIS — Z853 Personal history of malignant neoplasm of breast: Secondary | ICD-10-CM | POA: Diagnosis not present

## 2019-03-23 DIAGNOSIS — Z794 Long term (current) use of insulin: Secondary | ICD-10-CM | POA: Diagnosis not present

## 2019-03-23 DIAGNOSIS — I351 Nonrheumatic aortic (valve) insufficiency: Secondary | ICD-10-CM | POA: Diagnosis not present

## 2019-03-23 DIAGNOSIS — I5042 Chronic combined systolic (congestive) and diastolic (congestive) heart failure: Secondary | ICD-10-CM | POA: Diagnosis not present

## 2019-03-23 DIAGNOSIS — M1990 Unspecified osteoarthritis, unspecified site: Secondary | ICD-10-CM | POA: Diagnosis not present

## 2019-03-23 DIAGNOSIS — I110 Hypertensive heart disease with heart failure: Secondary | ICD-10-CM | POA: Diagnosis not present

## 2019-03-23 DIAGNOSIS — Z9013 Acquired absence of bilateral breasts and nipples: Secondary | ICD-10-CM | POA: Diagnosis not present

## 2019-03-23 DIAGNOSIS — E1151 Type 2 diabetes mellitus with diabetic peripheral angiopathy without gangrene: Secondary | ICD-10-CM | POA: Diagnosis not present

## 2019-03-23 DIAGNOSIS — I825Z2 Chronic embolism and thrombosis of unspecified deep veins of left distal lower extremity: Secondary | ICD-10-CM | POA: Diagnosis not present

## 2019-03-29 ENCOUNTER — Other Ambulatory Visit: Payer: Self-pay | Admitting: Nurse Practitioner

## 2019-03-30 ENCOUNTER — Telehealth: Payer: Self-pay | Admitting: Nurse Practitioner

## 2019-03-30 DIAGNOSIS — Z794 Long term (current) use of insulin: Secondary | ICD-10-CM | POA: Diagnosis not present

## 2019-03-30 DIAGNOSIS — I110 Hypertensive heart disease with heart failure: Secondary | ICD-10-CM | POA: Diagnosis not present

## 2019-03-30 DIAGNOSIS — I5042 Chronic combined systolic (congestive) and diastolic (congestive) heart failure: Secondary | ICD-10-CM | POA: Diagnosis not present

## 2019-03-30 DIAGNOSIS — I825Z2 Chronic embolism and thrombosis of unspecified deep veins of left distal lower extremity: Secondary | ICD-10-CM | POA: Diagnosis not present

## 2019-03-30 DIAGNOSIS — I351 Nonrheumatic aortic (valve) insufficiency: Secondary | ICD-10-CM | POA: Diagnosis not present

## 2019-03-30 DIAGNOSIS — E1151 Type 2 diabetes mellitus with diabetic peripheral angiopathy without gangrene: Secondary | ICD-10-CM | POA: Diagnosis not present

## 2019-03-30 DIAGNOSIS — M1990 Unspecified osteoarthritis, unspecified site: Secondary | ICD-10-CM | POA: Diagnosis not present

## 2019-03-30 DIAGNOSIS — Z9013 Acquired absence of bilateral breasts and nipples: Secondary | ICD-10-CM | POA: Diagnosis not present

## 2019-03-30 DIAGNOSIS — Z7901 Long term (current) use of anticoagulants: Secondary | ICD-10-CM | POA: Diagnosis not present

## 2019-03-30 DIAGNOSIS — Z853 Personal history of malignant neoplasm of breast: Secondary | ICD-10-CM | POA: Diagnosis not present

## 2019-03-30 NOTE — Telephone Encounter (Signed)
Patient taking her medications as directed. Nurse even had them show her how they use the insulin pen and they are using it correctly. Blood Sugar taken twice daily, takes before she eats. Took BS again before lunch while nurse there and it was 319.  Please Advise.

## 2019-03-30 NOTE — Telephone Encounter (Signed)
Lets have her schedule a follow up, to have blood sugars for review, she will most likely need adjustments in medication

## 2019-03-30 NOTE — Telephone Encounter (Signed)
Patient notified and appointment scheduled. 

## 2019-03-30 NOTE — Telephone Encounter (Signed)
Charlene Rubio with Charlene Rubio called.  Patient's blood sugar was 258 this morning.  Charlene Rubio wants to make Charlene Rubio aware of patient's continued blood sugar elevation.

## 2019-03-31 ENCOUNTER — Other Ambulatory Visit: Payer: Self-pay

## 2019-03-31 ENCOUNTER — Ambulatory Visit (INDEPENDENT_AMBULATORY_CARE_PROVIDER_SITE_OTHER): Payer: Medicare Other | Admitting: Nurse Practitioner

## 2019-03-31 ENCOUNTER — Encounter: Payer: Self-pay | Admitting: Nurse Practitioner

## 2019-03-31 VITALS — BP 122/60 | HR 70 | Temp 98.8°F | Ht 66.0 in | Wt 172.0 lb

## 2019-03-31 DIAGNOSIS — E118 Type 2 diabetes mellitus with unspecified complications: Secondary | ICD-10-CM

## 2019-03-31 DIAGNOSIS — R197 Diarrhea, unspecified: Secondary | ICD-10-CM

## 2019-03-31 MED ORDER — LANTUS SOLOSTAR 100 UNIT/ML ~~LOC~~ SOPN: 15.0000 [IU] | PEN_INJECTOR | Freq: Every day | SUBCUTANEOUS | 99 refills | Status: DC

## 2019-03-31 NOTE — Patient Instructions (Addendum)
STOP metformin to see if this is contributing to diarrhea  Can add benefiber daily as needed to help bulk stools   Increase lantus to 15 units.  Referral placed for endocrinologist and diabetic educator

## 2019-03-31 NOTE — Progress Notes (Signed)
Careteam: Patient Care Team: Lauree Chandler, NP as PCP - General (Geriatric Medicine) Nicholas Lose, MD as Consulting Physician (Hematology and Oncology) Eppie Gibson, MD as Attending Physician (Radiation Oncology) Fanny Skates, MD as Consulting Physician (General Surgery)  Advanced Directive information    No Known Allergies  Chief Complaint  Patient presents with   Acute Visit    Elevated blood sugar    Diarrhea    Ongoing diarrhea (constant)     HPI: Patient is a 73 y.o. female seen in the office today due to elevated blood sugars Pt was seen in office by Dinah on 03/11/2019 due to elevated blood sugars and A1c of 11.1. She has been maintained on metformin 500 mg daily and started on Lantus 10 units at bedtime. Had an in home nurse that has really helped him.  Husband can monitor but does not know what he is monitoring.  He is cooking.  Blood sugar fasting are trending down from 223-350 Has cut out all cookies and ice cream and orange juice Cut out diet coke.  No hypoglycemia    Diarrhea has been bad for 8 months. No solid stools. Having accidents. No blood. No pain with diarrhea or abdominal discomfort. Does not associate diarrhea with a medication.    Review of Systems:  Review of Systems  Unable to perform ROS: Dementia    Past Medical History:  Diagnosis Date   Acute diastolic (congestive) heart failure ([place]) 04/25/2017   Acute respiratory failure with hypoxia and hypercapnia ([place]) 04/25/2017   ALLERGIC RHINITIS 07/01/2007   Qualifier: Diagnosis of  By: Sherren Mocha MD, Jory Ee    Allergy    Aortic insufficiency    Aortic valve disorder 07/01/2007   Qualifier: Diagnosis of  By: Sherren Mocha MD, Dellis Filbert A    Arthritis    Bilateral leg edema 04/25/2017   Borderline diabetic    Cancer ([place])    Breast cancer   CHF (congestive heart failure) ([place]) 04/25/2017   Dementia ([place]) 04/30/2017   Diabetes mellitus ([place]) 04/25/2017   DOE (dyspnea on exertion)  04/25/2017   Essential hypertension 07/01/2007   Qualifier: Diagnosis of  By: Sherren Mocha MD, Dellis Filbert A    Glaucoma suspect    Hypertension    KNEE PAIN, LEFT 04/06/2010   Qualifier: Diagnosis of  By: Sherren Mocha MD, Dellis Filbert A    Nonischemic cardiomyopathy ([place])    Obesity    Obesity, unspecified 07/01/2007   Qualifier: Diagnosis of  By: Sherren Mocha MD, Dellis Filbert A    Peripheral vascular disease ([place])    blood clots in legs   Thyroid disease    Past Surgical History:  Procedure Laterality Date   CESAREAN SECTION     MASTECTOMY  08/27/2017   LEFT BREAST MODIFIED RADICAL MASTECTOMY (Left Breast)   MASTECTOMY  08/27/2017   RIGHT TOTAL MASTECTOMY WITH SENTINEL LYMPH NODE BIOPSY (Right Breast)   MASTECTOMY W/ SENTINEL NODE BIOPSY Right 08/27/2017   Procedure: RIGHT TOTAL MASTECTOMY WITH SENTINEL LYMPH NODE BIOPSY;  Surgeon: Fanny Skates, MD;  Location: [facility];  Service: General;  Laterality: Right;   MODIFIED MASTECTOMY Left 08/27/2017   Procedure: LEFT BREAST MODIFIED RADICAL MASTECTOMY;  Surgeon: Fanny Skates, MD;  Location: [facility];  Service: General;  Laterality: Left;   PORTACATH PLACEMENT Right 10/09/2017   PORTACATH PLACEMENT Right 10/09/2017   Procedure: INSERTION PORT-A-CATH WITH ULTRASOUND;  Surgeon: Fanny Skates, MD;  Location: [facility];  Service: General;  Laterality: Right;   Social History:   reports that she  has never smoked. She has never used smokeless tobacco. She reports that she does not drink alcohol or use drugs.  Family History  Problem Relation Age of Onset   Alzheimer's disease Mother 72   Alzheimer's disease Father 51   Congestive Heart Failure Father    Alcoholism Brother    Alzheimer's disease Brother    Dementia Brother    Alcoholism Brother    Diabetes Brother     Medications: Patient's Medications  New Prescriptions   No medications on file  Previous Medications   BLOOD GLUCOSE MONITORING SUPPL (ONE TOUCH ULTRA 2) W/DEVICE KIT    Use to test  blood sugar 2 times  daily DX: E11.9   CARVEDILOL (COREG) 12.5 MG TABLET    TAKE 1 TABLET (12.5 MG TOTAL) BY MOUTH 2 (TWO) TIMES DAILY WITH A MEAL.   DONEPEZIL (ARICEPT) 10 MG TABLET    TAKE 1 TABLET BY MOUTH AT  BEDTIME   ELIQUIS 5 MG TABS TABLET    TAKE 1 TABLET BY MOUTH TWICE A DAY   ENTRESTO 24-26 MG    TAKE 1 TABLET BY MOUTH TWICE A DAY   FUROSEMIDE (LASIX) 80 MG TABLET    TAKE 1 TABLET BY MOUTH  DAILY   GLUCOSE BLOOD (ONETOUCH ULTRA) TEST STRIP    Use as instructed to test blood sugar 2 times daily DX: E11.9   INSULIN GLARGINE (LANTUS SOLOSTAR) 100 UNIT/ML SOLOSTAR PEN    Inject 10 Units into the skin at bedtime.   INSULIN PEN NEEDLE (PEN NEEDLES) 31G X 5 MM MISC    1 each by Does not apply route 2 (two) times daily. Use twice daily with Insulin Pen. Dx: E11.9   KLOR-CON M20 20 MEQ TABLET    TAKE 1 TABLET BY MOUTH EVERY DAY   LETROZOLE (FEMARA) 2.5 MG TABLET    TAKE 1 TABLET BY MOUTH  DAILY   MEMANTINE (NAMENDA) 10 MG TABLET    Take 0.5 tablets (5 mg total) by mouth 2 (two) times daily for 7 days. take10 mg tablet one by mouth twice daily.   METFORMIN (GLUCOPHAGE-XR) 500 MG 24 HR TABLET    TAKE 1 TABLET BY MOUTH 2  TIMES DAILY WITH MEALS   MULTIPLE VITAMINS-MINERALS (ADULT GUMMY PO)    Take 1 tablet by mouth 3 (three) times a week.   ONETOUCH DELICA LANCETS 03K MISC    Use as instructed to test blood sugar 2 times daily DX: E11.9  Modified Medications   No medications on file  Discontinued Medications   No medications on file    Physical Exam:  Vitals:   03/31/19 1008  BP: 122/60  Pulse: 70  Temp: 98.8 F (37.1 C)  TempSrc: Oral  SpO2: 96%  Weight: 172 lb (78 kg)  Height: '5\' 6"'  (1.676 m)   Body mass index is 27.76 kg/m. Wt Readings from Last 3 Encounters:  03/31/19 172 lb (78 kg)  03/11/19 176 lb (79.8 kg)  09/14/18 169 lb 12.8 oz (77 kg)    Physical Exam Vitals signs reviewed.  Constitutional:      General: She is not in acute distress.    Appearance: She is  overweight. She is not ill-appearing.  HENT:     Head: Normocephalic.     Nose: No congestion or rhinorrhea.     Mouth/Throat:     Mouth: Mucous membranes are moist.     Pharynx: Oropharynx is clear. No oropharyngeal exudate or posterior oropharyngeal erythema.  Eyes:  General: No scleral icterus.       Right eye: No discharge.        Left eye: No discharge.     Conjunctiva/sclera: Conjunctivae normal.     Pupils: Pupils are equal, round, and reactive to light.  Neck:     Musculoskeletal: Normal range of motion. No neck rigidity or muscular tenderness.  Cardiovascular:     Rate and Rhythm: Normal rate and regular rhythm.     Pulses: Normal pulses.     Heart sounds: Murmur present. No friction rub. No gallop.   Pulmonary:     Effort: Pulmonary effort is normal. No respiratory distress.     Breath sounds: Normal breath sounds. No wheezing, rhonchi or rales.  Chest:     Chest wall: No tenderness.  Abdominal:     General: Bowel sounds are normal. There is no distension.     Palpations: Abdomen is soft. There is no mass.     Tenderness: There is no abdominal tenderness. There is no right CVA tenderness, left CVA tenderness, guarding or rebound.  Musculoskeletal:        General: No swelling or tenderness.     Comments: Unsteady gait ambulates with assist has walker but forgets to use it.bilateral lower extremities 1-2 + edema.   Lymphadenopathy:     Cervical: No cervical adenopathy.  Skin:    General: Skin is warm and dry.     Coloration: Skin is not pale.     Findings: No erythema or rash.  Neurological:     Mental Status: She is alert. Mental status is at baseline.     Cranial Nerves: No cranial nerve deficit.     Sensory: No sensory deficit.     Motor: No weakness.     Gait: Gait abnormal.  Psychiatric:        Mood and Affect: Mood normal.        Speech: Speech normal.        Behavior: Behavior is cooperative.        Thought Content: Thought content normal.         Cognition and Memory: Memory is impaired.     Labs reviewed: Basic Metabolic Panel: Recent Labs    08/20/18 1042 09/14/18 0908 03/08/19 0933  NA 141 143 137  K 4.6 3.8 4.5  CL 109 108 103  CO2 19* 23 24  GLUCOSE 204* 257* 318*  BUN '20 20 18  ' CREATININE 0.79 0.83 0.86  CALCIUM 9.4 9.1 9.5  TSH  --   --  1.86   Liver Function Tests: Recent Labs    06/23/18 1026 08/03/18 0925 09/14/18 0908 03/08/19 0933  AST 10* 9* 9* 11  ALT '9 10 10 10  ' ALKPHOS 77 72 67  --   BILITOT 0.5 0.4 0.5 0.6  PROT 6.5 6.5 6.8 6.5  ALBUMIN 3.7 3.7 3.7  --    No results for input(s): LIPASE, AMYLASE in the last 8760 hours. No results for input(s): AMMONIA in the last 8760 hours. CBC: Recent Labs    08/03/18 0925 09/14/18 0908 03/08/19 0933  WBC 4.0 6.9 6.4  NEUTROABS 2.9 6.0 5,005  HGB 12.4 12.7 14.0  HCT 37.4 37.8 41.4  MCV 97.1 97.2 96.5  PLT 166 161 207   Lipid Panel: Recent Labs    07/15/18 0840  CHOL 148  HDL 52  LDLCALC 75  TRIG 131  CHOLHDL 2.8   TSH: Recent Labs    03/08/19 0933  TSH 1.86  A1C: Lab Results  Component Value Date   HGBA1C 11.1 (H) 03/08/2019     Assessment/Plan 1. Type 2 diabetes mellitus with complication, without long-term current use of insulin (HCC) -will stop metformin at this time due to uncontrollable diarrhea.  -overall blood sugars are improving but still elevated, will refer to endocrinology for further evaluation, treatment and education.  -will increase lantus to 15 units at bedtime - Ambulatory referral to diabetic education - Ambulatory referral to Endocrinology  2. Diarrhea, unspecified type -has progressively gotten worse over the last 8 months. Will stop metformin to see if this is cause. Can add benefiber to bulk stools as well. aricept may also be contributing.   Next appt: 05/04/2019 for routine follow up [place] [place], [facility] [phone]

## 2019-04-01 ENCOUNTER — Telehealth: Payer: Self-pay | Admitting: *Deleted

## 2019-04-01 DIAGNOSIS — Z794 Long term (current) use of insulin: Secondary | ICD-10-CM | POA: Diagnosis not present

## 2019-04-01 DIAGNOSIS — Z7901 Long term (current) use of anticoagulants: Secondary | ICD-10-CM | POA: Diagnosis not present

## 2019-04-01 DIAGNOSIS — I5042 Chronic combined systolic (congestive) and diastolic (congestive) heart failure: Secondary | ICD-10-CM | POA: Diagnosis not present

## 2019-04-01 DIAGNOSIS — I351 Nonrheumatic aortic (valve) insufficiency: Secondary | ICD-10-CM | POA: Diagnosis not present

## 2019-04-01 DIAGNOSIS — Z9013 Acquired absence of bilateral breasts and nipples: Secondary | ICD-10-CM | POA: Diagnosis not present

## 2019-04-01 DIAGNOSIS — M1990 Unspecified osteoarthritis, unspecified site: Secondary | ICD-10-CM | POA: Diagnosis not present

## 2019-04-01 DIAGNOSIS — I825Z2 Chronic embolism and thrombosis of unspecified deep veins of left distal lower extremity: Secondary | ICD-10-CM | POA: Diagnosis not present

## 2019-04-01 DIAGNOSIS — Z853 Personal history of malignant neoplasm of breast: Secondary | ICD-10-CM | POA: Diagnosis not present

## 2019-04-01 DIAGNOSIS — E1151 Type 2 diabetes mellitus with diabetic peripheral angiopathy without gangrene: Secondary | ICD-10-CM | POA: Diagnosis not present

## 2019-04-01 DIAGNOSIS — I110 Hypertensive heart disease with heart failure: Secondary | ICD-10-CM | POA: Diagnosis not present

## 2019-04-01 NOTE — Telephone Encounter (Signed)
Charlene Rubio with [facility] called and stated that patient was seen yesterday and changes were made with patient's medications and she is calling to clarify.      Assessment/Plan 1. Type 2 diabetes mellitus with complication, without long-term current use of insulin (HCC) -will stop metformin at this time due to uncontrollable diarrhea.  -overall blood sugars are improving but still elevated, will refer to endocrinology for further evaluation, treatment and education.  -will increase lantus to 15 units at bedtime - Ambulatory referral to diabetic education - Ambulatory referral to Endocrinology

## 2019-04-03 ENCOUNTER — Other Ambulatory Visit: Payer: Self-pay | Admitting: Family

## 2019-04-05 DIAGNOSIS — Z9013 Acquired absence of bilateral breasts and nipples: Secondary | ICD-10-CM | POA: Diagnosis not present

## 2019-04-05 DIAGNOSIS — M1990 Unspecified osteoarthritis, unspecified site: Secondary | ICD-10-CM | POA: Diagnosis not present

## 2019-04-05 DIAGNOSIS — I825Z2 Chronic embolism and thrombosis of unspecified deep veins of left distal lower extremity: Secondary | ICD-10-CM | POA: Diagnosis not present

## 2019-04-05 DIAGNOSIS — Z794 Long term (current) use of insulin: Secondary | ICD-10-CM | POA: Diagnosis not present

## 2019-04-05 DIAGNOSIS — I110 Hypertensive heart disease with heart failure: Secondary | ICD-10-CM | POA: Diagnosis not present

## 2019-04-05 DIAGNOSIS — Z7901 Long term (current) use of anticoagulants: Secondary | ICD-10-CM | POA: Diagnosis not present

## 2019-04-05 DIAGNOSIS — E1151 Type 2 diabetes mellitus with diabetic peripheral angiopathy without gangrene: Secondary | ICD-10-CM | POA: Diagnosis not present

## 2019-04-05 DIAGNOSIS — Z853 Personal history of malignant neoplasm of breast: Secondary | ICD-10-CM | POA: Diagnosis not present

## 2019-04-05 DIAGNOSIS — I351 Nonrheumatic aortic (valve) insufficiency: Secondary | ICD-10-CM | POA: Diagnosis not present

## 2019-04-05 DIAGNOSIS — I5042 Chronic combined systolic (congestive) and diastolic (congestive) heart failure: Secondary | ICD-10-CM | POA: Diagnosis not present

## 2019-04-09 ENCOUNTER — Other Ambulatory Visit: Payer: Self-pay

## 2019-04-09 DIAGNOSIS — E1151 Type 2 diabetes mellitus with diabetic peripheral angiopathy without gangrene: Secondary | ICD-10-CM | POA: Diagnosis not present

## 2019-04-09 DIAGNOSIS — Z9013 Acquired absence of bilateral breasts and nipples: Secondary | ICD-10-CM | POA: Diagnosis not present

## 2019-04-09 DIAGNOSIS — M1990 Unspecified osteoarthritis, unspecified site: Secondary | ICD-10-CM | POA: Diagnosis not present

## 2019-04-09 DIAGNOSIS — Z853 Personal history of malignant neoplasm of breast: Secondary | ICD-10-CM | POA: Diagnosis not present

## 2019-04-09 DIAGNOSIS — Z794 Long term (current) use of insulin: Secondary | ICD-10-CM | POA: Diagnosis not present

## 2019-04-09 DIAGNOSIS — I5042 Chronic combined systolic (congestive) and diastolic (congestive) heart failure: Secondary | ICD-10-CM | POA: Diagnosis not present

## 2019-04-09 DIAGNOSIS — I110 Hypertensive heart disease with heart failure: Secondary | ICD-10-CM | POA: Diagnosis not present

## 2019-04-09 DIAGNOSIS — I351 Nonrheumatic aortic (valve) insufficiency: Secondary | ICD-10-CM | POA: Diagnosis not present

## 2019-04-09 DIAGNOSIS — I825Z2 Chronic embolism and thrombosis of unspecified deep veins of left distal lower extremity: Secondary | ICD-10-CM | POA: Diagnosis not present

## 2019-04-09 DIAGNOSIS — Z7901 Long term (current) use of anticoagulants: Secondary | ICD-10-CM | POA: Diagnosis not present

## 2019-04-12 DIAGNOSIS — Z7901 Long term (current) use of anticoagulants: Secondary | ICD-10-CM | POA: Diagnosis not present

## 2019-04-12 DIAGNOSIS — I351 Nonrheumatic aortic (valve) insufficiency: Secondary | ICD-10-CM | POA: Diagnosis not present

## 2019-04-12 DIAGNOSIS — Z853 Personal history of malignant neoplasm of breast: Secondary | ICD-10-CM | POA: Diagnosis not present

## 2019-04-12 DIAGNOSIS — Z9013 Acquired absence of bilateral breasts and nipples: Secondary | ICD-10-CM | POA: Diagnosis not present

## 2019-04-12 DIAGNOSIS — I110 Hypertensive heart disease with heart failure: Secondary | ICD-10-CM | POA: Diagnosis not present

## 2019-04-12 DIAGNOSIS — Z794 Long term (current) use of insulin: Secondary | ICD-10-CM | POA: Diagnosis not present

## 2019-04-12 DIAGNOSIS — I5042 Chronic combined systolic (congestive) and diastolic (congestive) heart failure: Secondary | ICD-10-CM | POA: Diagnosis not present

## 2019-04-12 DIAGNOSIS — M1990 Unspecified osteoarthritis, unspecified site: Secondary | ICD-10-CM | POA: Diagnosis not present

## 2019-04-12 DIAGNOSIS — I825Z2 Chronic embolism and thrombosis of unspecified deep veins of left distal lower extremity: Secondary | ICD-10-CM | POA: Diagnosis not present

## 2019-04-12 DIAGNOSIS — E1151 Type 2 diabetes mellitus with diabetic peripheral angiopathy without gangrene: Secondary | ICD-10-CM | POA: Diagnosis not present

## 2019-04-13 ENCOUNTER — Other Ambulatory Visit: Payer: Self-pay

## 2019-04-13 ENCOUNTER — Encounter: Payer: Self-pay | Admitting: Internal Medicine

## 2019-04-13 ENCOUNTER — Ambulatory Visit (INDEPENDENT_AMBULATORY_CARE_PROVIDER_SITE_OTHER): Payer: Medicare Other | Admitting: Internal Medicine

## 2019-04-13 VITALS — BP 114/78 | HR 66 | Temp 98.1°F | Ht 66.0 in | Wt 177.6 lb

## 2019-04-13 DIAGNOSIS — E1165 Type 2 diabetes mellitus with hyperglycemia: Secondary | ICD-10-CM

## 2019-04-13 MED ORDER — INSULIN LISPRO PROT & LISPRO (75-25 MIX) 100 UNIT/ML KWIKPEN: 12.0000 [IU] | PEN_INJECTOR | Freq: Two times a day (BID) | SUBCUTANEOUS | 11 refills | Status: DC

## 2019-04-13 NOTE — Progress Notes (Signed)
Name: Charlene Rubio  MRN/ DOB: [id number], 1946-02-01   Age/ Sex: 73 y.o., female    PCP: Lauree Chandler, NP   Reason for Endocrinology Evaluation: Type 2 Diabetes Mellitus     Date of Initial Endocrinology Visit: 04/14/2019     PATIENT IDENTIFIER: Ms. Charlene Rubio is a 73 y.o. female with a past medical history of HTN, CHD (2018), CHF , T2DM , memory loss and Breast Ca (S/P mastectomy 2019) . The patient presented for initial endocrinology clinic visit on 04/14/2019 for consultative assistance with her diabetes management.    HPI: Ms. [name] is accompanied by her husband who is her caregiver. Pt is a poor historian due to dementia   Diagnosed with DM in 2018 Prior Medications tried/Intolerance: Metformin- diarrhea  Currently checking blood sugars 2 x / day,  before breakfast and bedtime  Hypoglycemia episodes : no      Hemoglobin A1c has ranged from 6.4% in 2019, peaking at 11.1% in 2020. Patient required assistance for hypoglycemia: no Patient has required hospitalization within the last 1 year from hyper or hypoglycemia: no  In terms of diet, the patient eats 3 meals a day, does not snack most of the time, but occasional ice cream, avoid sugar sweetened beverages (stopped all sugar-sweetened beverages 4 weeks ago)   HOME DIABETES REGIMEN: Lantus 15 units QHS   Statin: Yes ACE-I/ARB: no  Prior Diabetic Education: no    GLUCOSE LOG:  BG > 200 mg/dL    DIABETIC COMPLICATIONS: Microvascular complications:    Denies: CKD, neuropathy, retinopathy   Last eye exam: Completed   Macrovascular complications:    Denies: CAD, PVD, CVA   PAST HISTORY: Past Medical History:  Past Medical History:  Diagnosis Date  . Acute diastolic (congestive) heart failure ([place]) 04/25/2017  . Acute respiratory failure with hypoxia and hypercapnia ([place]) 04/25/2017  . ALLERGIC RHINITIS 07/01/2007   Qualifier: Diagnosis of  By: Sherren Mocha MD, Jory Ee   . Allergy   . Aortic insufficiency    . Aortic valve disorder 07/01/2007   Qualifier: Diagnosis of  By: Sherren Mocha MD, Jory Ee   . Arthritis   . Bilateral leg edema 04/25/2017  . Borderline diabetic   . Cancer [facility])    Breast cancer  . CHF (congestive heart failure) ([place]) 04/25/2017  . Dementia ([place]) 04/30/2017  . Diabetes mellitus ([place]) 04/25/2017  . DOE (dyspnea on exertion) 04/25/2017  . Essential hypertension 07/01/2007   Qualifier: Diagnosis of  By: Sherren Mocha MD, Jory Ee Glaucoma suspect   . Hypertension   . KNEE PAIN, LEFT 04/06/2010   Qualifier: Diagnosis of  By: Sherren Mocha MD, Jory Ee   . Nonischemic cardiomyopathy ([place])   . Obesity   . Obesity, unspecified 07/01/2007   Qualifier: Diagnosis of  By: Sherren Mocha MD, Jory Ee   . Peripheral vascular disease ([place])    blood clots in legs  . Thyroid disease    Past Surgical History:  Past Surgical History:  Procedure Laterality Date  . CESAREAN SECTION    . MASTECTOMY  08/27/2017   LEFT BREAST MODIFIED RADICAL MASTECTOMY (Left Breast)  . MASTECTOMY  08/27/2017   RIGHT TOTAL MASTECTOMY WITH SENTINEL LYMPH NODE BIOPSY (Right Breast)  . MASTECTOMY W/ SENTINEL NODE BIOPSY Right 08/27/2017   Procedure: RIGHT TOTAL MASTECTOMY WITH SENTINEL LYMPH NODE BIOPSY;  Surgeon: Fanny Skates, MD;  Location: [place];  Service: General;  Laterality: Right;  . MODIFIED MASTECTOMY Left 08/27/2017   Procedure: LEFT BREAST  MODIFIED RADICAL MASTECTOMY;  Surgeon: Fanny Skates, MD;  Location: [place];  Service: General;  Laterality: Left;  . PORTACATH PLACEMENT Right 10/09/2017  . PORTACATH PLACEMENT Right 10/09/2017   Procedure: INSERTION PORT-A-CATH WITH ULTRASOUND;  Surgeon: Fanny Skates, MD;  Location: [place];  Service: General;  Laterality: Right;      Social History:  reports that she has never smoked. She has never used smokeless tobacco. She reports that she does not drink alcohol or use drugs. Family History:  Family History  Problem Relation Age of Onset  . Alzheimer's disease Mother 56  .  Alzheimer's disease Father 20  . Congestive Heart Failure Father   . Alcoholism Brother   . Alzheimer's disease Brother   . Dementia Brother   . Alcoholism Brother   . Diabetes Brother      HOME MEDICATIONS: Allergies as of 04/13/2019   No Known Allergies     Medication List       Accurate as of April 13, 2019 11:59 PM. If you have any questions, ask your nurse or doctor.        STOP taking these medications   Lantus SoloStar 100 UNIT/ML Solostar Pen Generic drug: Insulin Glargine Stopped by: Dorita Sciara, MD     TAKE these medications   ADULT GUMMY PO Take 1 tablet by mouth 3 (three) times a week.   carvedilol 12.5 MG tablet Commonly known as: COREG TAKE 1 TABLET (12.5 MG TOTAL) BY MOUTH 2 (TWO) TIMES DAILY WITH A MEAL.   Dexcom G6 Receiver Devi 1 Device by Does not apply route as directed. Started by: Dorita Sciara, MD   Dexcom G6 Sensor Misc 1 Device by Does not apply route as directed. Started by: Dorita Sciara, MD   Dexcom G6 Transmitter Misc 1 Device by Does not apply route as directed. Started by: Dorita Sciara, MD   donepezil 10 MG tablet Commonly known as: ARICEPT TAKE 1 TABLET BY MOUTH AT  BEDTIME   Eliquis 5 MG Tabs tablet Generic drug: apixaban TAKE 1 TABLET BY MOUTH TWICE A DAY   Entresto 24-26 MG Generic drug: sacubitril-valsartan TAKE 1 TABLET BY MOUTH TWICE A DAY   furosemide 80 MG tablet Commonly known as: LASIX TAKE 1 TABLET BY MOUTH  DAILY   Insulin Lispro Prot & Lispro (75-25) 100 UNIT/ML Kwikpen Commonly known as: HumaLOG Mix 75/25 KwikPen Inject 12 Units into the skin 2 (two) times daily with a meal. Started by: Dorita Sciara, MD   Klor-Con M20 20 MEQ tablet Generic drug: potassium chloride SA TAKE 1 TABLET BY MOUTH EVERY DAY   letrozole 2.5 MG tablet Commonly known as: FEMARA TAKE 1 TABLET BY MOUTH  DAILY   memantine 10 MG tablet Commonly known as: NAMENDA TAKE 1/2 TAB 2 (TWO)  TIMES DAILY FOR 7 DAYS. TAKE 1 TABLET ONE BY MOUTH TWICE DAILY.   ONE TOUCH ULTRA 2 w/Device Kit Use to test blood sugar 2 times  daily DX: J47.8   OneTouch Delica Lancets 29F Misc Use as instructed to test blood sugar 2 times daily DX: E11.9   OneTouch Ultra test strip Generic drug: glucose blood Use as instructed to test blood sugar 2 times daily DX: E11.9   Pen Needles 31G X 5 MM Misc 1 each by Does not apply route 2 (two) times daily. Use twice daily with Insulin Pen. Dx: E11.9        ALLERGIES: No Known Allergies   REVIEW OF SYSTEMS: A  comprehensive ROS was conducted with the patient and is negative except as per HPI and below:  Review of Systems  Constitutional: Negative for chills and fever.  Respiratory: Negative for cough and shortness of breath.   Cardiovascular: Negative for chest pain and palpitations.  Gastrointestinal: Positive for diarrhea. Negative for nausea.  Genitourinary: Positive for frequency.  Neurological: Negative for tingling and tremors.  Endo/Heme/Allergies: Positive for polydipsia.      OBJECTIVE:   VITAL SIGNS: BP 114/78 (BP Location: Right Arm, Patient Position: Sitting, Cuff Size: Normal)   Pulse 66   Temp 98.1 F (36.7 C)   Ht _0  (1.676 m)   Wt 177 lb 9.6 oz (80.6 kg)   LMP  (LMP Unknown)   SpO2 98%   BMI 28.67 kg/m    PHYSICAL EXAM:  General: Pt appears in NAD  Hydration: Well-hydrated with moist mucous membranes and good skin turgor  HEENT: Head: Unremarkable. Oropharynx clear without exudate.  Eyes: External eye exam normal without stare, lid lag or exophthalmos.  EOM intact.    Neck: General: Supple without adenopathy or carotid bruits. Thyroid: Thyroid size normal.  No goiter or nodules appreciated. No thyroid bruit.  Lungs: Clear with good BS bilat with no rales, rhonchi, or wheezes  Heart: RRR with normal S1 and S2 and no gallops; no murmurs; no rub  Abdomen: Normoactive bowel sounds, soft, nontender, without masses  or organomegaly palpable  Extremities:  Lower extremities - Trace pretibial edema.   Skin: Normal texture and temperature to palpation.      DATA REVIEWED:  Lab Results  Component Value Date   HGBA1C 11.1 (H) 03/08/2019   HGBA1C 6.4 (H) 07/15/2018   HGBA1C 6.3 (H) 03/17/2018   Lab Results  Component Value Date   MICROALBUR 0.7 03/20/2018   LDLCALC 75 07/15/2018   CREATININE 0.86 03/08/2019   Lab Results  Component Value Date   MICRALBCREAT 6 03/20/2018    Lab Results  Component Value Date   CHOL 148 07/15/2018   HDL 52 07/15/2018   LDLCALC 75 07/15/2018   TRIG 131 07/15/2018   CHOLHDL 2.8 07/15/2018        ASSESSMENT / PLAN / RECOMMENDATIONS:   1) Type 2 Diabetes Mellitus, Poorly controlled, Without complications - Most recent A1c of 11.1 %. Goal A1c < 7.5 %.    - Pt with dementia , dependent on husband for diabetes care.  - She is intolerant to Metformin- Diarrhea.  - I do believe insulin Mix  is the safest thing for her at this time and also for ease of use on the husband, will see if we can get Dexcom approved as she is not expressive of feeling so will be difficult to know if she is having hypoglycemic episodes.   MEDICATIONS: - STOP Lantus  -  Start Humalog MIX 12 units with Breakfast and 12 units with supper  EDUCATION / INSTRUCTIONS:  BG monitoring instructions: Patient is instructed to check her blood sugars 4 times a day, before meals and bedtime  Call [place] [facility] clinic if: BG persistently < 70 or > 300. . I reviewed the Rule of 15 for the treatment of hypoglycemia in detail with the patient. Literature supplied.  F/u in 8 weeks    Signed electronically by: Mack Guise, MD  [facility]  [facility] [address] [place], [place] [address] Phone: [phone] FAX: [phone]   CC: Lauree Chandler, NP [place]  [address] Phone: [phone]  Fax: [phone]     Return to Endocrinology clinic as below: Future Appointments  Date Time Provider [place]  04/22/2019  8:45 AM Nicholas Lose, MD [facility] None  05/04/2019  9:30 AM Lauree Chandler, NP [facility] None  06/01/2019 10:30 AM Shamleffer, Melanie Crazier, MD [facility] None  02/23/2020 10:30 AM Lauree Chandler, NP [facility] None

## 2019-04-13 NOTE — Patient Instructions (Signed)
-   STOP Lantus   - Start Humalog MIX 12 units with Breakfast and 12 units with supper  - Check sugar before breakfast and Before Supper    -HOW TO TREAT LOW BLOOD SUGARS (Blood sugar LESS THAN 70 MG/DL)  Please follow the RULE OF 15 for the treatment of hypoglycemia treatment (when your (blood sugars are less than 70 mg/dL)    STEP 1: Take 15 grams of carbohydrates when your blood sugar is low, which includes:   3-4 GLUCOSE TABS  OR  3-4 OZ OF JUICE OR REGULAR SODA OR  ONE TUBE OF GLUCOSE GEL     STEP 2: RECHECK blood sugar in 15 MINUTES STEP 3: If your blood sugar is still low at the 15 minute recheck --> then, go back to STEP 1 and treat AGAIN with another 15 grams of carbohydrates.

## 2019-04-14 MED ORDER — DEXCOM G6 TRANSMITTER MISC: 1.0000 | 3 refills | Status: DC

## 2019-04-14 MED ORDER — DEXCOM G6 SENSOR MISC: 1.0000 | 6 refills | Status: DC

## 2019-04-14 MED ORDER — DEXCOM G6 RECEIVER DEVI: 1.0000 | 0 refills | Status: DC

## 2019-04-15 DIAGNOSIS — Z853 Personal history of malignant neoplasm of breast: Secondary | ICD-10-CM | POA: Diagnosis not present

## 2019-04-15 DIAGNOSIS — Z9013 Acquired absence of bilateral breasts and nipples: Secondary | ICD-10-CM | POA: Diagnosis not present

## 2019-04-15 DIAGNOSIS — Z7901 Long term (current) use of anticoagulants: Secondary | ICD-10-CM | POA: Diagnosis not present

## 2019-04-15 DIAGNOSIS — Z794 Long term (current) use of insulin: Secondary | ICD-10-CM | POA: Diagnosis not present

## 2019-04-15 DIAGNOSIS — I5042 Chronic combined systolic (congestive) and diastolic (congestive) heart failure: Secondary | ICD-10-CM | POA: Diagnosis not present

## 2019-04-15 DIAGNOSIS — I110 Hypertensive heart disease with heart failure: Secondary | ICD-10-CM | POA: Diagnosis not present

## 2019-04-15 DIAGNOSIS — I351 Nonrheumatic aortic (valve) insufficiency: Secondary | ICD-10-CM | POA: Diagnosis not present

## 2019-04-15 DIAGNOSIS — I825Z2 Chronic embolism and thrombosis of unspecified deep veins of left distal lower extremity: Secondary | ICD-10-CM | POA: Diagnosis not present

## 2019-04-15 DIAGNOSIS — M1990 Unspecified osteoarthritis, unspecified site: Secondary | ICD-10-CM | POA: Diagnosis not present

## 2019-04-15 DIAGNOSIS — E1151 Type 2 diabetes mellitus with diabetic peripheral angiopathy without gangrene: Secondary | ICD-10-CM | POA: Diagnosis not present

## 2019-04-15 NOTE — Assessment & Plan Note (Signed)
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 Adjuvant Herceptinstarted 10/14/17 completed 09/14/2018 2. Adjuvant radiation therapy 10/23/17-11/25/17 3. adjuvant antiestrogen therapystarted February 2019 --------------------------------------------------  Letrozoletoxicities:She is tolerating it extremely well. Denies any hot flashes or arthralgias or myalgias.  Breast cancer surveillance: No role of mammogram since she had bilateral mastectomies. Return to clinic in 1 year for follow-up

## 2019-04-19 ENCOUNTER — Other Ambulatory Visit: Payer: Self-pay | Admitting: Family

## 2019-04-19 ENCOUNTER — Telehealth: Payer: Self-pay | Admitting: Nurse Practitioner

## 2019-04-19 MED ORDER — ONETOUCH VERIO VI STRP: ORAL_STRIP | 3 refills | Status: DC

## 2019-04-19 NOTE — Telephone Encounter (Signed)
Patient husband requested refill.

## 2019-04-19 NOTE — Telephone Encounter (Signed)
pts husband is using the 1 touch blood sugar machine for wife.  Finger sticks are inter changeable, but he's noticed that the "reading sticks" are not. He wants to know if that's something that can be bought over the counter or has to be ordered by [place].  Please call to advise. Thanks, Lattie Haw

## 2019-04-19 NOTE — Addendum Note (Signed)
Addended by: Rafael Bihari A on: 04/19/2019 10:35 AM   Modules accepted: Orders

## 2019-04-21 DIAGNOSIS — I825Z2 Chronic embolism and thrombosis of unspecified deep veins of left distal lower extremity: Secondary | ICD-10-CM | POA: Diagnosis not present

## 2019-04-21 DIAGNOSIS — I5042 Chronic combined systolic (congestive) and diastolic (congestive) heart failure: Secondary | ICD-10-CM | POA: Diagnosis not present

## 2019-04-21 DIAGNOSIS — Z9013 Acquired absence of bilateral breasts and nipples: Secondary | ICD-10-CM | POA: Diagnosis not present

## 2019-04-21 DIAGNOSIS — Z853 Personal history of malignant neoplasm of breast: Secondary | ICD-10-CM | POA: Diagnosis not present

## 2019-04-21 DIAGNOSIS — Z794 Long term (current) use of insulin: Secondary | ICD-10-CM | POA: Diagnosis not present

## 2019-04-21 DIAGNOSIS — I351 Nonrheumatic aortic (valve) insufficiency: Secondary | ICD-10-CM | POA: Diagnosis not present

## 2019-04-21 DIAGNOSIS — Z7901 Long term (current) use of anticoagulants: Secondary | ICD-10-CM | POA: Diagnosis not present

## 2019-04-21 DIAGNOSIS — E1151 Type 2 diabetes mellitus with diabetic peripheral angiopathy without gangrene: Secondary | ICD-10-CM | POA: Diagnosis not present

## 2019-04-21 DIAGNOSIS — I110 Hypertensive heart disease with heart failure: Secondary | ICD-10-CM | POA: Diagnosis not present

## 2019-04-21 DIAGNOSIS — M1990 Unspecified osteoarthritis, unspecified site: Secondary | ICD-10-CM | POA: Diagnosis not present

## 2019-04-21 NOTE — Progress Notes (Signed)
Patient Care Team: Lauree Chandler, NP as PCP - General (Geriatric Medicine) Nicholas Lose, MD as Consulting Physician (Hematology and Oncology) Eppie Gibson, MD as Attending Physician (Radiation Oncology) Fanny Skates, MD as Consulting Physician (General Surgery)  DIAGNOSIS:    ICD-10-CM   1. 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, estrogen receptor positive ([place])  06/23/2017 Initial Diagnosis   Left breast 11 o'clock position 6.3 x 4.2 x 6.1 cm mass with thick papillary projections, 2 adjacent left axillary lymph nodes; right breast 930 position 1.7 cm mass; left breast and left lymph node biopsy revealed IDC grade 3, ER 5%, PR 0%, Ki-67 50%, HER-2 positive ratio 2.04 T3N1 stage IIIa; right breast: Grade 1 IDC  ER 95%, PR 95%, Ki-67 15%, HER-2 negative; T1CN0 stage I a   08/27/2017 Surgery   Right simple mastectomy: IDC grade 1, 1.1 cm, DCIS low-grade, margins negative, 0/2 lymph nodes negative; ER 95%, PR 95%, HER-2 negative Ki-67 15% T1 cN0 stage I a left simple mastectomy: IDC grade 3, 6.2 cm, 4/13 lymph nodes positive, margins negative, ER 5 %, PR 0%, HER-2 positive ratio 2.04, Ki-67 50 % T3 N2 stage IIIa    10/14/2017 - 09/2018 Chemotherapy   Adjuvant Herceptin alone for 1 year    09/2017 -  Anti-estrogen oral therapy   Letrozole daily   10/23/2017 - 11/25/2017 Radiation Therapy   Adjuvant radiation therapy   12/05/2017 Genetic Testing   The Common Hereditary Cancer Panel offered by [facility] includes sequencing and/or deletion duplication testing of the following 47 genes: APC, ATM, AXIN2, BARD1, BMPR1A, BRCA1, BRCA2, BRIP1, CDH1, CDKN2A (p14ARF), CDKN2A (p16INK4a), CKD4, CHEK2, CTNNA1, DICER1, EPCAM (Deletion/duplication testing only), GREM1 (promoter region  deletion/duplication testing only), KIT, MEN1, MLH1, MSH2, MSH3, MSH6, MUTYH, NBN, NF1, NHTL1, PALB2, PDGFRA, PMS2, POLD1, POLE, PTEN, RAD50, RAD51C, RAD51D, SDHB, SDHC, SDHD, SMAD4, SMARCA4. STK11, TP53, TSC1, TSC2, and VHL.  The following genes were evaluated for sequence changes only: SDHA and HOXB13 c.251G>A variant only.  Results: Negative, no pathogenic variants identified.  The date of this test report is 12/05/2017.      CHIEF COMPLIANT: Follow-up of left breast cancer on letrozole  INTERVAL HISTORY: Charlene Rubio is a 73 y.o. with above-mentioned history of left breast cancer who underwent bilateral mastectomies, radiation, and maintenance Herceptin. She is currently on anti-estrogen therapy with letrozole. She presents to the clinic today with her husband for follow-up.   REVIEW OF SYSTEMS:   Constitutional: Denies fevers, chills or abnormal weight loss Eyes: Denies blurriness of vision Ears, nose, mouth, throat, and face: Denies mucositis or sore throat Respiratory: Denies cough, dyspnea or wheezes Cardiovascular: Denies palpitation, chest discomfort Gastrointestinal: Denies nausea, heartburn or change in bowel habits Skin: Denies abnormal skin rashes Lymphatics: Denies new lymphadenopathy or easy bruising Neurological: Denies numbness, tingling or new weaknesses Behavioral/Psych: Mood is stable, no new changes  Extremities: No lower extremity edema Breast: denies any pain or lumps or nodules in either breasts All other systems were reviewed with the patient and are negative.  I have reviewed the past medical history, past surgical history, social history and family history with the patient and they are unchanged from previous note.  ALLERGIES:  has No Known Allergies.  MEDICATIONS:  Current Outpatient Medications  Medication Sig  Dispense Refill  . Blood Glucose Monitoring Suppl (ONE TOUCH ULTRA 2) w/Device KIT Use to test blood sugar 2 times  daily DX: E11.9 1 kit 0  .  carvedilol (COREG) 12.5 MG tablet TAKE 1 TABLET (12.5 MG TOTAL) BY MOUTH 2 (TWO) TIMES DAILY WITH A MEAL. 180 tablet 3  . Continuous Blood Gluc Receiver (DEXCOM G6 RECEIVER) DEVI 1 Device by Does not apply route as directed. 1 Device 0  . Continuous Blood Gluc Sensor (DEXCOM G6 SENSOR) MISC 1 Device by Does not apply route as directed. 3 each 6  . Continuous Blood Gluc Transmit (DEXCOM G6 TRANSMITTER) MISC 1 Device by Does not apply route as directed. 1 each 3  . donepezil (ARICEPT) 10 MG tablet TAKE 1 TABLET BY MOUTH AT  BEDTIME 90 tablet 1  . ELIQUIS 5 MG TABS tablet TAKE 1 TABLET BY MOUTH TWICE A DAY 180 tablet 1  . ENTRESTO 24-26 MG TAKE 1 TABLET BY MOUTH TWICE A DAY 60 tablet 5  . furosemide (LASIX) 80 MG tablet TAKE 1 TABLET BY MOUTH  DAILY 90 tablet 3  . glucose blood (ONETOUCH VERIO) test strip One Touch Verio. Use to test blood sugar twice daily Dx: E11.9 100 each 3  . Insulin Lispro Prot & Lispro (HUMALOG MIX 75/25 KWIKPEN) (75-25) 100 UNIT/ML Kwikpen Inject 12 Units into the skin 2 (two) times daily with a meal. 15 mL 11  . Insulin Pen Needle (PEN NEEDLES) 31G X 5 MM MISC 1 each by Does not apply route 2 (two) times daily. Use twice daily with Insulin Pen. Dx: E11.9 200 each 1  . KLOR-CON M20 20 MEQ tablet TAKE 1 TABLET BY MOUTH EVERY DAY 90 tablet 1  . letrozole (FEMARA) 2.5 MG tablet TAKE 1 TABLET BY MOUTH  DAILY 90 tablet 0  . memantine (NAMENDA) 10 MG tablet TAKE 1/2 TAB 2 (TWO) TIMES DAILY FOR 7 DAYS. TAKE 1 TABLET ONE BY MOUTH TWICE DAILY. 180 tablet 1  . Multiple Vitamins-Minerals (ADULT GUMMY PO) Take 1 tablet by mouth 3 (three) times a week.    Glory Rosebush Delica Lancets 46K MISC Use as instructed to test blood sugar 2 times daily DX: E11.9 100 each 0   No current facility-administered medications for this visit.     PHYSICAL EXAMINATION: ECOG PERFORMANCE STATUS: 1 - Symptomatic but completely ambulatory  There were no vitals filed for this visit. There were no vitals filed  for this visit.  GENERAL: alert, no distress and comfortable SKIN: skin color, texture, turgor are normal, no rashes or significant lesions EYES: normal, Conjunctiva are pink and non-injected, sclera clear OROPHARYNX: no exudate, no erythema and lips, buccal mucosa, and tongue normal  NECK: supple, thyroid normal size, non-tender, without nodularity LYMPH: no palpable lymphadenopathy in the cervical, axillary or inguinal LUNGS: clear to auscultation and percussion with normal breathing effort HEART: regular rate & rhythm and no murmurs and no lower extremity edema ABDOMEN: abdomen soft, non-tender and normal bowel sounds MUSCULOSKELETAL: no cyanosis of digits and no clubbing  NEURO: alert & oriented x 3 with fluent speech, no focal motor/sensory deficits EXTREMITIES: No lower extremity edema  LABORATORY DATA:  I have reviewed the data as listed CMP Latest Ref Rng & Units 03/08/2019 09/14/2018 08/20/2018  Glucose 65 - 99 mg/dL 318(H) 257(H) 204(H)  BUN 7 - 25 mg/dL _0 Creatinine 0.60 - 0.93 mg/dL 0.86 0.83 0.79  Sodium 135 - 146 mmol/L 137 143 141  Potassium 3.5 - 5.3  mmol/L 4.5 3.8 4.6  Chloride 98 - 110 mmol/L 103 108 109  CO2 20 - 32 mmol/L 24 23 19(L)  Calcium 8.6 - 10.4 mg/dL 9.5 9.1 9.4  Total Protein 6.1 - 8.1 g/dL 6.5 6.8 -  Total Bilirubin 0.2 - 1.2 mg/dL 0.6 0.5 -  Alkaline Phos 38 - 126 U/L - 67 -  AST 10 - 35 U/L 11 9(L) -  ALT 6 - 29 U/L 10 10 -    Lab Results  Component Value Date   WBC 6.4 03/08/2019   HGB 14.0 03/08/2019   HCT 41.4 03/08/2019   MCV 96.5 03/08/2019   PLT 207 03/08/2019   NEUTROABS 5,005 03/08/2019    ASSESSMENT & PLAN:  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, estrogen receptor positive ([place]) Right simple mastectomy: IDC grade 1, 1.1 cm, DCIS low-grade, margins negative, 0/2 lymph nodes negative; ER 95%, PR 95%, HER-2 negative Ki-67 15%T1 cN0 stage I a left simple mastectomy: IDC grade 3, 6.2 cm, 4/13 lymph  nodes positive, margins negative, ER 5 %, PR 0%, HER-2 positive ratio 2.04, Ki-67 50 % T3 N2 stage IIIa Echocardiogram 09/24/2017: EF 50%  Recommendation: 1. Adjuvant Herceptinstarted 10/14/17 completed 09/14/2018 2. Adjuvant radiation therapy 10/23/17-11/25/17 3. adjuvant antiestrogen therapystarted February 2019 --------------------------------------------------  Letrozoletoxicities:She is tolerating it extremely well. Denies any hot flashes or arthralgias or myalgias.  Breast cancer surveillance: No role of mammogram since she had bilateral mastectomies. Chest wall exam does not reveal any lumps or nodules. [facility] to [facility]. Return to clinic in 1 year for follow-up  No orders of the defined types were placed in this encounter.  The patient has a good understanding of the overall plan. she agrees with it. she will call with any problems that may develop before the next visit here.  Nicholas Lose, MD 04/22/2019  Julious Oka Dorshimer am acting as scribe for Dr. Nicholas Lose.  I have reviewed the above documentation for accuracy and completeness, and I agree with the above.

## 2019-04-22 ENCOUNTER — Other Ambulatory Visit: Payer: Self-pay

## 2019-04-22 ENCOUNTER — Inpatient Hospital Stay: Payer: Medicare Other | Attending: Adult Health | Admitting: Hematology and Oncology

## 2019-04-22 ENCOUNTER — Other Ambulatory Visit (HOSPITAL_COMMUNITY): Payer: Self-pay

## 2019-04-22 DIAGNOSIS — C773 Secondary and unspecified malignant neoplasm of axilla and upper limb lymph nodes: Secondary | ICD-10-CM | POA: Diagnosis not present

## 2019-04-22 DIAGNOSIS — C50811 Malignant neoplasm of overlapping sites of right female breast: Secondary | ICD-10-CM | POA: Diagnosis not present

## 2019-04-22 DIAGNOSIS — C50812 Malignant neoplasm of overlapping sites of left female breast: Secondary | ICD-10-CM | POA: Diagnosis not present

## 2019-04-22 DIAGNOSIS — Z170 Estrogen receptor positive status [ER+]: Secondary | ICD-10-CM | POA: Insufficient documentation

## 2019-04-22 DIAGNOSIS — Z9013 Acquired absence of bilateral breasts and nipples: Secondary | ICD-10-CM | POA: Insufficient documentation

## 2019-04-22 MED ORDER — POTASSIUM CHLORIDE CRYS ER 20 MEQ PO TBCR: 20.0000 meq | EXTENDED_RELEASE_TABLET | Freq: Every day | ORAL | 1 refills | Status: DC

## 2019-04-22 MED ORDER — LETROZOLE 2.5 MG PO TABS: 2.5000 mg | ORAL_TABLET | Freq: Every day | ORAL | 3 refills | Status: AC

## 2019-04-23 ENCOUNTER — Telehealth: Payer: Self-pay | Admitting: Hematology and Oncology

## 2019-04-23 DIAGNOSIS — I825Z2 Chronic embolism and thrombosis of unspecified deep veins of left distal lower extremity: Secondary | ICD-10-CM | POA: Diagnosis not present

## 2019-04-23 DIAGNOSIS — I110 Hypertensive heart disease with heart failure: Secondary | ICD-10-CM | POA: Diagnosis not present

## 2019-04-23 DIAGNOSIS — I5042 Chronic combined systolic (congestive) and diastolic (congestive) heart failure: Secondary | ICD-10-CM | POA: Diagnosis not present

## 2019-04-23 DIAGNOSIS — I351 Nonrheumatic aortic (valve) insufficiency: Secondary | ICD-10-CM | POA: Diagnosis not present

## 2019-04-23 DIAGNOSIS — Z794 Long term (current) use of insulin: Secondary | ICD-10-CM | POA: Diagnosis not present

## 2019-04-23 DIAGNOSIS — Z9013 Acquired absence of bilateral breasts and nipples: Secondary | ICD-10-CM | POA: Diagnosis not present

## 2019-04-23 DIAGNOSIS — Z853 Personal history of malignant neoplasm of breast: Secondary | ICD-10-CM | POA: Diagnosis not present

## 2019-04-23 DIAGNOSIS — Z7901 Long term (current) use of anticoagulants: Secondary | ICD-10-CM | POA: Diagnosis not present

## 2019-04-23 DIAGNOSIS — E1151 Type 2 diabetes mellitus with diabetic peripheral angiopathy without gangrene: Secondary | ICD-10-CM | POA: Diagnosis not present

## 2019-04-23 DIAGNOSIS — M1990 Unspecified osteoarthritis, unspecified site: Secondary | ICD-10-CM | POA: Diagnosis not present

## 2019-04-23 NOTE — Telephone Encounter (Signed)
I left a message regarding schedule  

## 2019-04-27 ENCOUNTER — Ambulatory Visit: Payer: Medicare Other | Admitting: Nurse Practitioner

## 2019-04-28 ENCOUNTER — Other Ambulatory Visit (HOSPITAL_COMMUNITY): Payer: Self-pay

## 2019-04-28 MED ORDER — POTASSIUM CHLORIDE CRYS ER 20 MEQ PO TBCR: 20.0000 meq | EXTENDED_RELEASE_TABLET | Freq: Every day | ORAL | 1 refills | Status: DC

## 2019-04-29 ENCOUNTER — Other Ambulatory Visit: Payer: Self-pay | Admitting: Family

## 2019-04-29 ENCOUNTER — Telehealth: Payer: Self-pay

## 2019-04-29 DIAGNOSIS — I10 Essential (primary) hypertension: Secondary | ICD-10-CM

## 2019-04-29 DIAGNOSIS — E118 Type 2 diabetes mellitus with unspecified complications: Secondary | ICD-10-CM

## 2019-04-29 NOTE — Telephone Encounter (Signed)
-----   Message from Marko Plume sent at 04/29/2019 12:26 PM EDT ----- Regarding: Visit on 05/06/2019 Her husband wants to know if she really still needs this appt. He is also wondering if she doesn't need another A1C drawn or should they wait until they go to the diabetes doctor near the end of Oct.

## 2019-04-29 NOTE — Telephone Encounter (Signed)
Continue to follow up with Endocrinologist.Yes keep your appointment with Janett Billow E.NP for routine visit.

## 2019-04-29 NOTE — Telephone Encounter (Addendum)
Spoke with [name]. [name] is aware endocrinologist will take over A1c. Patient will see them on 06/01/2019  [name] questions if patient should keep follow-up with Charlene Rubio for next Thursday. [name] states diarrhea has improved since stopping metformin although still present.    Message ok to wait for Charlene Rubio to address on Monday 05/03/2019

## 2019-04-30 ENCOUNTER — Other Ambulatory Visit: Payer: Self-pay

## 2019-04-30 ENCOUNTER — Telehealth: Payer: Self-pay | Admitting: Internal Medicine

## 2019-04-30 MED ORDER — ONETOUCH VERIO VI STRP: ORAL_STRIP | 3 refills | Status: DC

## 2019-04-30 NOTE — Telephone Encounter (Signed)
MEDICATION: One Touch Verio Test Strips  PHARMACY:  [facility] - mail order pharmacy  IS THIS A 90 DAY SUPPLY : yes  IS PATIENT OUT OF MEDICATION:   IF NOT; HOW MUCH IS LEFT:   LAST APPOINTMENT DATE: @8 /25/2020  NEXT APPOINTMENT DATE:@10 /13/2020  DO WE HAVE YOUR PERMISSION TO LEAVE A DETAILED MESSAGE: yes [phone]  OTHER COMMENTS:    **Let patient know to contact pharmacy at the end of the day to make sure medication is ready. **  ** Please notify patient to allow 48-72 hours to process**  **Encourage patient to contact the pharmacy for refills or they can request refills through [facility]**

## 2019-04-30 NOTE — Telephone Encounter (Signed)
Sent!

## 2019-05-03 NOTE — Telephone Encounter (Signed)
Would still recommend her keeping routine follow up

## 2019-05-03 NOTE — Telephone Encounter (Signed)
Called david and left message recommending patient reschedule appointment to sooner date. Appointment was canceled on 04/30/2019

## 2019-05-04 ENCOUNTER — Ambulatory Visit: Payer: Medicare Other | Admitting: Nurse Practitioner

## 2019-05-06 ENCOUNTER — Ambulatory Visit: Payer: Medicare Other | Admitting: Nurse Practitioner

## 2019-05-07 DIAGNOSIS — H40023 Open angle with borderline findings, high risk, bilateral: Secondary | ICD-10-CM | POA: Diagnosis not present

## 2019-05-07 DIAGNOSIS — H2513 Age-related nuclear cataract, bilateral: Secondary | ICD-10-CM | POA: Diagnosis not present

## 2019-05-07 DIAGNOSIS — H34831 Tributary (branch) retinal vein occlusion, right eye, with macular edema: Secondary | ICD-10-CM | POA: Diagnosis not present

## 2019-05-21 ENCOUNTER — Other Ambulatory Visit (HOSPITAL_COMMUNITY): Payer: Self-pay | Admitting: Cardiology

## 2019-06-01 ENCOUNTER — Encounter: Payer: Self-pay | Admitting: Internal Medicine

## 2019-06-01 ENCOUNTER — Ambulatory Visit (INDEPENDENT_AMBULATORY_CARE_PROVIDER_SITE_OTHER): Payer: Medicare Other | Admitting: Internal Medicine

## 2019-06-01 ENCOUNTER — Other Ambulatory Visit: Payer: Self-pay

## 2019-06-01 DIAGNOSIS — E1165 Type 2 diabetes mellitus with hyperglycemia: Secondary | ICD-10-CM

## 2019-06-01 NOTE — Progress Notes (Signed)
Virtual Visit via Telephone Note  I connected with Charlene Rubio 06/01/19 at 10:30 AM by telephone and verified that I am speaking with the correct person using two identifiers.   I discussed the limitations, risks, security and privacy concerns of performing an evaluation and management service by telephone and the availability of in person appointments. I also discussed with the patient that there may be a patient responsible charge related to this service. The patient expressed understanding and agreed to proceed.  -Location of the patient : Home  -Location of the provider : Office -The names of all persons participating in the telemedicine service : Pt , spouse and myself         Name: Charlene Rubio  Age/ Sex: 73 y.o., female   MRN/ DOB: [id number], [birth year]     PCP: Lauree Chandler, NP   Reason for Endocrinology Evaluation: Type 2 Diabetes Mellitus  Initial Endocrine Consultative Visit: 04/13/2019    PATIENT IDENTIFIER: Charlene Rubio is a 73 y.o. female with a past medical history of HTN, CHD (2018), CHF , T2DM , memory loss and Breast Ca (S/P mastectomy 2019). The patient has followed with Endocrinology clinic since 04/13/2019 for consultative assistance with management of her diabetes.  DIABETIC HISTORY:  Charlene Rubio was diagnosed with T2DM in 2018. Has been on Metformin with reported diarrhea. On her initial visit to our clinic, she has been on lantus only with an A1c of 11.1% . Her hemoglobin A1c has ranged from 6.4% in 2019, peaking at 11.1% in 2020  SUBJECTIVE:   During the last visit (04/13/2019): A1c 11.1% . We stopped lantus and started Humalog Mix.   Today (06/01/2019): Charlene Rubio is here for 8 week follow up on diabetes management. She has dementia and most of the history was obtained from the husband.  She checks her blood sugars 2 times daily, preprandial to breakfast and supper. The patient has  Not had hypoglycemic episodes since the last clinic visit.  Otherwise, the patient has not required any recent emergency interventions for hypoglycemia and has not had recent hospitalizations secondary to hyper or hypoglycemic episodes.    ROS: As per HPI and as detailed below: Review of Systems  HENT: Negative for congestion and sore throat.   Respiratory: Negative for cough and shortness of breath.   Cardiovascular: Negative for chest pain and palpitations.  Gastrointestinal: Negative for diarrhea and nausea.      HOME DIABETES REGIMEN:  Start Humalog MIX 12 units with Breakfast and 12 units with supper     GLUCOSE LOG:  This AM 100 05/31/19  Fasting :121    Suppertime:151     HISTORY:  Past Medical History:  Past Medical History:  Diagnosis Date   Acute diastolic (congestive) heart failure ([place]) 04/25/2017   Acute respiratory failure with hypoxia and hypercapnia ([place]) 04/25/2017   ALLERGIC RHINITIS 07/01/2007   Qualifier: Diagnosis of  By: Sherren Mocha MD, Dellis Filbert A    Allergy    Aortic insufficiency    Aortic valve disorder 07/01/2007   Qualifier: Diagnosis of  By: Sherren Mocha MD, Dellis Filbert A    Arthritis    Bilateral leg edema 04/25/2017   Borderline diabetic    Cancer ([place])    Breast cancer   CHF (congestive heart failure) ([place]) 04/25/2017   Dementia ([place]) 04/30/2017   Diabetes mellitus ([place]) 04/25/2017   DOE (dyspnea on exertion) 04/25/2017   Essential hypertension 07/01/2007   Qualifier: Diagnosis of  By: Sherren Mocha MD, Jory Ee  Glaucoma suspect    Hypertension    KNEE PAIN, LEFT 04/06/2010   Qualifier: Diagnosis of  By: Sherren Mocha MD, Dellis Filbert A    Nonischemic cardiomyopathy ([place])    Obesity    Obesity, unspecified 07/01/2007   Qualifier: Diagnosis of  By: Sherren Mocha MD, Dellis Filbert A    Peripheral vascular disease ([place])    blood clots in legs   Thyroid disease    Past Surgical History:  Past Surgical History:  Procedure Laterality Date   CESAREAN SECTION     MASTECTOMY  08/27/2017   LEFT BREAST MODIFIED RADICAL MASTECTOMY  (Left Breast)   MASTECTOMY  08/27/2017   RIGHT TOTAL MASTECTOMY WITH SENTINEL LYMPH NODE BIOPSY (Right Breast)   MASTECTOMY W/ SENTINEL NODE BIOPSY Right 08/27/2017   Procedure: RIGHT TOTAL MASTECTOMY WITH SENTINEL LYMPH NODE BIOPSY;  Surgeon: Fanny Skates, MD;  Location: [facility];  Service: General;  Laterality: Right;   MODIFIED MASTECTOMY Left 08/27/2017   Procedure: LEFT BREAST MODIFIED RADICAL MASTECTOMY;  Surgeon: Fanny Skates, MD;  Location: [facility];  Service: General;  Laterality: Left;   PORTACATH PLACEMENT Right 10/09/2017   PORTACATH PLACEMENT Right 10/09/2017   Procedure: INSERTION PORT-A-CATH WITH ULTRASOUND;  Surgeon: Fanny Skates, MD;  Location: [facility];  Service: General;  Laterality: Right;    Social History:  reports that she has never smoked. She has never used smokeless tobacco. She reports that she does not drink alcohol or use drugs. Family History:  Family History  Problem Relation Age of Onset   Alzheimer's disease Mother 37   Alzheimer's disease Father 34   Congestive Heart Failure Father    Alcoholism Brother    Alzheimer's disease Brother    Dementia Brother    Alcoholism Brother    Diabetes Brother      HOME MEDICATIONS: Allergies as of 06/01/2019   No Known Allergies     Medication List       Accurate as of June 01, 2019 11:55 AM. If you have any questions, ask your nurse or doctor.        ADULT GUMMY PO Take 1 tablet by mouth 3 (three) times a week.   carvedilol 12.5 MG tablet Commonly known as: COREG TAKE 1 TABLET (12.5 MG TOTAL) BY MOUTH 2 (TWO) TIMES DAILY WITH A MEAL.   Dexcom G6 Receiver Devi 1 Device by Does not apply route as directed.   Dexcom G6 Sensor Misc 1 Device by Does not apply route as directed.   Dexcom G6 Transmitter Misc 1 Device by Does not apply route as directed.   donepezil 10 MG tablet Commonly known as: ARICEPT TAKE 1 TABLET BY MOUTH AT  BEDTIME   Eliquis 5 MG Tabs tablet Generic drug:  apixaban TAKE 1 TABLET BY MOUTH TWICE A DAY   Entresto 24-26 MG Generic drug: sacubitril-valsartan TAKE 1 TABLET BY MOUTH TWICE A DAY   furosemide 80 MG tablet Commonly known as: LASIX TAKE 1 TABLET BY MOUTH  DAILY   Insulin Lispro Prot & Lispro (75-25) 100 UNIT/ML Kwikpen Commonly known as: HumaLOG Mix 75/25 KwikPen Inject 12 Units into the skin 2 (two) times daily with a meal.   letrozole 2.5 MG tablet Commonly known as: FEMARA Take 1 tablet (2.5 mg total) by mouth daily.   memantine 10 MG tablet Commonly known as: NAMENDA TAKE 1/2 TAB 2 (TWO) TIMES DAILY FOR 7 DAYS. TAKE 1 TABLET ONE BY MOUTH TWICE DAILY.   ONE TOUCH ULTRA 2 w/Device Kit Use to test blood sugar  2 times  daily DX: O03.5   OneTouch Delica Lancets 59R Misc Use as instructed to test blood sugar 2 times daily DX: E11.9   OneTouch Verio test strip Generic drug: glucose blood One Touch Verio. Use to test blood sugar twice daily Dx: E11.9   Pen Needles 31G X 5 MM Misc 1 each by Does not apply route 2 (two) times daily. Use twice daily with Insulin Pen. Dx: E11.9   potassium chloride SA 20 MEQ tablet Commonly known as: Klor-Con M20 Take 1 tablet (20 mEq total) by mouth daily.        DATA REVIEWED:  Lab Results  Component Value Date   HGBA1C 11.1 (H) 03/08/2019   HGBA1C 6.4 (H) 07/15/2018   HGBA1C 6.3 (H) 03/17/2018   Lab Results  Component Value Date   MICROALBUR 0.7 03/20/2018   LDLCALC 75 07/15/2018   CREATININE 0.86 03/08/2019   Lab Results  Component Value Date   MICRALBCREAT 6 03/20/2018     Lab Results  Component Value Date   CHOL 148 07/15/2018   HDL 52 07/15/2018   LDLCALC 75 07/15/2018   TRIG 131 07/15/2018   CHOLHDL 2.8 07/15/2018         ASSESSMENT / PLAN / RECOMMENDATIONS:   1) Type 2 Diabetes Mellitus, Poorly controlled, Without complications - Most recent A1c of 11.1 %. Goal A1c < 7.5 %.     - In review of her BG's that have been optimal without hypoglycemia -  Per husband fasting BG's 100-162 mg/dL and supper range 130-160 mg/dl  - Pt had a few questions that were answered to his satisfaction today.  - No changes will be made today     MEDICATIONS:  Humalog Mix 12 units with Breakfast and Supper time   EDUCATION / INSTRUCTIONS:  BG monitoring instructions: Patient is instructed to check her blood sugars 2 times a day, fasting and supper .  Call [facility] if: BG persistently < 70 or > 300.  I reviewed the Rule of 15 for the treatment of hypoglycemia in detail with the patient. Literature supplied.   I discussed the assessment and treatment plan with the patient. The patient was provided an opportunity to ask questions and all were answered. The patient agreed with the plan and demonstrated an understanding of the instructions.   The patient was advised to call back or seek an in-person evaluation if the symptoms worsen or if the condition fails to improve as anticipated.  I provided 13 minutes of non-face-to-face time during this encounter.   F/U in 3 months - Husband will call    Signed electronically by: Mack Guise, MD  [facility]  [facility] [address] [place], [place] [address] Phone: [phone] FAX: [phone]   CC: Lauree Chandler, NP [place] [address] Phone: [phone]  Fax: [phone]  Return to Endocrinology clinic as below: Future Appointments  Date Time Provider [place]  06/14/2019 11:00 AM Lauree Chandler, NP [facility] None  02/23/2020 10:30 AM Lauree Chandler, NP [facility] None  04/21/2020  9:15 AM Nicholas Lose, MD [facility] None

## 2019-06-02 ENCOUNTER — Other Ambulatory Visit: Payer: Medicare Other

## 2019-06-14 ENCOUNTER — Encounter: Payer: Self-pay | Admitting: Nurse Practitioner

## 2019-06-14 ENCOUNTER — Ambulatory Visit (INDEPENDENT_AMBULATORY_CARE_PROVIDER_SITE_OTHER): Payer: Medicare Other | Admitting: Nurse Practitioner

## 2019-06-14 ENCOUNTER — Other Ambulatory Visit: Payer: Self-pay

## 2019-06-14 VITALS — BP 118/64 | HR 61 | Temp 97.7°F | Ht 66.0 in | Wt 179.0 lb

## 2019-06-14 DIAGNOSIS — F0390 Unspecified dementia without behavioral disturbance: Secondary | ICD-10-CM

## 2019-06-14 DIAGNOSIS — E118 Type 2 diabetes mellitus with unspecified complications: Secondary | ICD-10-CM | POA: Diagnosis not present

## 2019-06-14 DIAGNOSIS — Z853 Personal history of malignant neoplasm of breast: Secondary | ICD-10-CM

## 2019-06-14 DIAGNOSIS — Z23 Encounter for immunization: Secondary | ICD-10-CM | POA: Diagnosis not present

## 2019-06-14 DIAGNOSIS — R197 Diarrhea, unspecified: Secondary | ICD-10-CM

## 2019-06-14 DIAGNOSIS — E2839 Other primary ovarian failure: Secondary | ICD-10-CM

## 2019-06-14 DIAGNOSIS — I5042 Chronic combined systolic (congestive) and diastolic (congestive) heart failure: Secondary | ICD-10-CM

## 2019-06-14 DIAGNOSIS — E663 Overweight: Secondary | ICD-10-CM

## 2019-06-14 NOTE — Patient Instructions (Signed)

## 2019-06-14 NOTE — Progress Notes (Signed)
Careteam: Patient Care Team: Lauree Chandler, NP as PCP - General (Geriatric Medicine) Nicholas Lose, MD as Consulting Physician (Hematology and Oncology) Eppie Gibson, MD as Attending Physician (Radiation Oncology) Fanny Skates, MD as Consulting Physician (General Surgery)  Advanced Directive information Does Patient Have a Medical Advance Directive?: Yes, Type of Advance Directive: [place];Living will, Does patient want to make changes to medical advance directive?: No - Patient declined  No Known Allergies  Chief Complaint  Patient presents with  . Medical Management of Chronic Issues    Routine follow-up. Patient's stomach and legs appear larger although no significant weight gain, ? if related to CHF.   Marland Kitchen Sleeping Problem    Patient is sleeping a lot during the day time.    . Medication Management    Discuss Namenda, not effective. Memory is getting worse   . Immunizations    Flu vaccine today   . Quality Metric Gaps    Colonoscopy, DEXA, Mammogram, MALB, and Foot exam due      HPI: Patient is a 73 y.o. female seen in the office today for routine follow up.   Dementia- memory is unchanged, questions about namanda- slow progression. Aware namenda and aricept is to slow disease progression. She is able to feed herself but husband helps her with dressing and using the bathroom. Sleeping a lot during the day but sleeps well at night too.   Diabetes- seeing endocrine. Readings have improved. Needs A1c repeated. No hypoglycemia. Had diabetic eye exam 3 weeks.    CHF- continues on entresto, lasix, and coreg. No swelling but reports legs are bigger. Feels like her abdomen in bloated. No weight gain.  No increase in shortness of breath. A little bit of a cough over the last 4-5 months but nothing major. Sleeps flat and feet proped.   Had the cologuard kit for a year but has not done it. Unsure If they will complete- understands risk and with her  dementia does not feel like they would persue treatment if she had cancer.   Hx of breast cancer- oncologist prescribing letrozole. Following yearly. No longer needing mammogram due to bilateral mastectomy  Review of Systems:  Review of Systems  Unable to perform ROS: Dementia    Past Medical History:  Diagnosis Date  . Acute diastolic (congestive) heart failure ([place]) 04/25/2017  . Acute respiratory failure with hypoxia and hypercapnia ([place]) 04/25/2017  . ALLERGIC RHINITIS 07/01/2007   Qualifier: Diagnosis of  By: Sherren Mocha MD, Jory Ee   . Allergy   . Aortic insufficiency   . Aortic valve disorder 07/01/2007   Qualifier: Diagnosis of  By: Sherren Mocha MD, Jory Ee   . Arthritis   . Bilateral leg edema 04/25/2017  . Borderline diabetic   . Cancer [facility])    Breast cancer  . CHF (congestive heart failure) ([place]) 04/25/2017  . Dementia ([place]) 04/30/2017  . Diabetes mellitus ([place]) 04/25/2017  . DOE (dyspnea on exertion) 04/25/2017  . Essential hypertension 07/01/2007   Qualifier: Diagnosis of  By: Sherren Mocha MD, Jory Ee Glaucoma suspect   . Hypertension   . KNEE PAIN, LEFT 04/06/2010   Qualifier: Diagnosis of  By: Sherren Mocha MD, Jory Ee   . Nonischemic cardiomyopathy ([place])   . Obesity   . Obesity, unspecified 07/01/2007   Qualifier: Diagnosis of  By: Sherren Mocha MD, Jory Ee   . Peripheral vascular disease ([place])    blood clots in legs  . Thyroid disease  Past Surgical History:  Procedure Laterality Date  . CESAREAN SECTION    . MASTECTOMY  08/27/2017   LEFT BREAST MODIFIED RADICAL MASTECTOMY (Left Breast)  . MASTECTOMY  08/27/2017   RIGHT TOTAL MASTECTOMY WITH SENTINEL LYMPH NODE BIOPSY (Right Breast)  . MASTECTOMY W/ SENTINEL NODE BIOPSY Right 08/27/2017   Procedure: RIGHT TOTAL MASTECTOMY WITH SENTINEL LYMPH NODE BIOPSY;  Surgeon: Fanny Skates, MD;  Location: [facility];  Service: General;  Laterality: Right;  . MODIFIED MASTECTOMY Left 08/27/2017   Procedure: LEFT BREAST MODIFIED RADICAL MASTECTOMY;   Surgeon: Fanny Skates, MD;  Location: [facility];  Service: General;  Laterality: Left;  . PORTACATH PLACEMENT Right 10/09/2017  . PORTACATH PLACEMENT Right 10/09/2017   Procedure: INSERTION PORT-A-CATH WITH ULTRASOUND;  Surgeon: Fanny Skates, MD;  Location: [facility];  Service: General;  Laterality: Right;   Social History:   reports that she has never smoked. She has never used smokeless tobacco. She reports that she does not drink alcohol or use drugs.  Family History  Problem Relation Age of Onset  . Alzheimer's disease Mother 80  . Alzheimer's disease Father 56  . Congestive Heart Failure Father   . Alcoholism Brother   . Alzheimer's disease Brother   . Dementia Brother   . Alcoholism Brother   . Diabetes Brother     Medications: Patient's Medications  New Prescriptions   No medications on file  Previous Medications   BLOOD GLUCOSE MONITORING SUPPL (ONE TOUCH ULTRA 2) W/DEVICE KIT    Use to test blood sugar 2 times  daily DX: E11.9   CARVEDILOL (COREG) 12.5 MG TABLET    TAKE 1 TABLET (12.5 MG TOTAL) BY MOUTH 2 (TWO) TIMES DAILY WITH A MEAL.   CONTINUOUS BLOOD GLUC RECEIVER (DEXCOM G6 RECEIVER) DEVI    1 Device by Does not apply route as directed.   CONTINUOUS BLOOD GLUC SENSOR (DEXCOM G6 SENSOR) MISC    1 Device by Does not apply route as directed.   CONTINUOUS BLOOD GLUC TRANSMIT (DEXCOM G6 TRANSMITTER) MISC    1 Device by Does not apply route as directed.   DONEPEZIL (ARICEPT) 10 MG TABLET    TAKE 1 TABLET BY MOUTH AT  BEDTIME   ELIQUIS 5 MG TABS TABLET    TAKE 1 TABLET BY MOUTH TWICE A DAY   ENTRESTO 24-26 MG    TAKE 1 TABLET BY MOUTH TWICE A DAY   FUROSEMIDE (LASIX) 80 MG TABLET    TAKE 1 TABLET BY MOUTH  DAILY   GLUCOSE BLOOD (ONETOUCH VERIO) TEST STRIP    One Touch Verio. Use to test blood sugar twice daily Dx: E11.9   INSULIN LISPRO PROT & LISPRO (HUMALOG MIX 75/25 KWIKPEN) (75-25) 100 UNIT/ML KWIKPEN    Inject 12 Units into the skin 2 (two) times daily with a meal.    INSULIN PEN NEEDLE (PEN NEEDLES) 31G X 5 MM MISC    1 each by Does not apply route 2 (two) times daily. Use twice daily with Insulin Pen. Dx: E11.9   LETROZOLE (FEMARA) 2.5 MG TABLET    Take 1 tablet (2.5 mg total) by mouth daily.   MEMANTINE (NAMENDA) 10 MG TABLET    TAKE 1/2 TAB 2 (TWO) TIMES DAILY FOR 7 DAYS. TAKE 1 TABLET ONE BY MOUTH TWICE DAILY.   MULTIPLE VITAMINS-MINERALS (ADULT GUMMY PO)    Take 1 tablet by mouth 3 (three) times a week.   ONETOUCH DELICA LANCETS 47Q MISC    Use as instructed to  test blood sugar 2 times daily DX: E11.9   POTASSIUM CHLORIDE SA (KLOR-CON M20) 20 MEQ TABLET    Take 1 tablet (20 mEq total) by mouth daily.  Modified Medications   No medications on file  Discontinued Medications   No medications on file    Physical Exam:  Vitals:   06/14/19 1124  BP: 118/64  Pulse: 61  Temp: 97.7 F (36.5 C)  TempSrc: Temporal  SpO2: 97%  Weight: 179 lb (81.2 kg)  Height: _0  (1.676 m)   Body mass index is 28.89 kg/m. Wt Readings from Last 3 Encounters:  06/14/19 179 lb (81.2 kg)  04/22/19 178 lb 4.8 oz (80.9 kg)  04/13/19 177 lb 9.6 oz (80.6 kg)    Physical Exam Constitutional:      General: She is not in acute distress.    Appearance: She is well-developed. She is not diaphoretic.  HENT:     Head: Normocephalic and atraumatic.     Mouth/Throat:     Pharynx: No oropharyngeal exudate.  Eyes:     Conjunctiva/sclera: Conjunctivae normal.     Pupils: Pupils are equal, round, and reactive to light.  Neck:     Musculoskeletal: Normal range of motion and neck supple.  Cardiovascular:     Rate and Rhythm: Normal rate and regular rhythm.     Heart sounds: Murmur present.  Pulmonary:     Effort: Pulmonary effort is normal.     Breath sounds: Normal breath sounds.  Abdominal:     General: Bowel sounds are normal.     Palpations: Abdomen is soft.  Musculoskeletal:        General: No tenderness.     Right lower leg: No edema.     Left lower leg: No  edema.  Skin:    General: Skin is warm and dry.  Neurological:     Mental Status: She is alert.  Psychiatric:        Mood and Affect: Mood normal.     Labs reviewed: Basic Metabolic Panel: Recent Labs    08/20/18 1042 09/14/18 0908 03/08/19 0933  NA 141 143 137  K 4.6 3.8 4.5  CL 109 108 103  CO2 19* 23 24  GLUCOSE 204* 257* 318*  BUN _1 CREATININE 0.79 0.83 0.86  CALCIUM 9.4 9.1 9.5  TSH  --   --  1.86   Liver Function Tests: Recent Labs    06/23/18 1026 08/03/18 0925 09/14/18 0908 03/08/19 0933  AST 10* 9* 9* 11  ALT _2 ALKPHOS 77 72 67  --   BILITOT 0.5 0.4 0.5 0.6  PROT 6.5 6.5 6.8 6.5  ALBUMIN 3.7 3.7 3.7  --    No results for input(s): LIPASE, AMYLASE in the last 8760 hours. No results for input(s): AMMONIA in the last 8760 hours. CBC: Recent Labs    08/03/18 0925 09/14/18 0908 03/08/19 0933  WBC 4.0 6.9 6.4  NEUTROABS 2.9 6.0 5,005  HGB 12.4 12.7 14.0  HCT 37.4 37.8 41.4  MCV 97.1 97.2 96.5  PLT 166 161 207   Lipid Panel: Recent Labs    07/15/18 0840  CHOL 148  HDL 52  LDLCALC 75  TRIG 131  CHOLHDL 2.8   TSH: Recent Labs    03/08/19 0933  TSH 1.86   A1C: Lab Results  Component Value Date   HGBA1C 11.1 (H) 03/08/2019     Assessment/Plan 1. Need for influenza vaccination - Flu Vaccine  QUAD High Dose(Fluad)  2. Estrogen deficiency - DG Bone Density; Future  3. Type 2 diabetes mellitus with complication, without long-term current use of insulin (HCC) -blood sugars reviewed and under great control at this time. Continues dietary modifications.  Encouraged dietary compliance, routine foot care/monitoring and to keep up with diabetic eye exams through ophthalmology  - Hemoglobin A1c - Lipid Panel  4. Diarrhea, unspecified type Improved since off metformin   5. Chronic combined systolic and diastolic congestive heart failure (HCC) Stable, no signs of fluid overload.  - COMPLETE METABOLIC PANEL WITH GFR -  CBC with Differential/Platelet  6. Dementia without behavioral disturbance, unspecified dementia type ([place]) Slow progressive decline, overall stable on namenda and aricept therefore will continue to use. Husband very supportive and assisting with her ADLS  7. Hx of breast cancer -following with oncologist yearly, continues on letrozole.   8. Overweight (BMI 25.0-29.9) -pts husband concerned that her thigh and abdomen are larger however no signs of fluid retention. Discussed that she has gained 12 lbs in the last year and that this is likely cause. She is more sedentary and encouraged more physical activity at this time.   Next appt: 6 months, sooner if needed  Jessica K. Freeborn, [facility] [phone]

## 2019-06-15 LAB — LIPID PANEL
Cholesterol: 146 mg/dL (ref <200)
HDL: 42 mg/dL — ABNORMAL LOW (ref >=50)
LDL Cholesterol (Calc): 83 mg/dL (calc)
Non-HDL Cholesterol (Calc): 104 mg/dL (calc) (ref <130)
Total CHOL/HDL Ratio: 3.5 (calc) (ref <5.0)
Triglycerides: 110 mg/dL (ref <150)

## 2019-06-15 LAB — CBC WITH DIFFERENTIAL/PLATELET
Absolute Monocytes: 456 cells/uL (ref 200–950)
Basophils Absolute: 42 cells/uL (ref 0–200)
Basophils Relative: 0.7 %
Eosinophils Absolute: 132 cells/uL (ref 15–500)
Eosinophils Relative: 2.2 %
HCT: 40 % (ref 35.0–45.0)
Hemoglobin: 13.5 g/dL (ref 11.7–15.5)
Lymphs Abs: 546 cells/uL — ABNORMAL LOW (ref 850–3900)
MCH: 32.9 pg (ref 27.0–33.0)
MCHC: 33.8 g/dL (ref 32.0–36.0)
MCV: 97.6 fL (ref 80.0–100.0)
MPV: 11.2 fL (ref 7.5–12.5)
Monocytes Relative: 7.6 %
Neutro Abs: 4824 cells/uL (ref 1500–7800)
Neutrophils Relative %: 80.4 %
Platelets: 203 10*3/uL (ref 140–400)
RBC: 4.1 10*6/uL (ref 3.80–5.10)
RDW: 12.4 % (ref 11.0–15.0)
Total Lymphocyte: 9.1 %
WBC: 6 10*3/uL (ref 3.8–10.8)

## 2019-06-15 LAB — COMPLETE METABOLIC PANEL WITH GFR
AG Ratio: 1.7 (calc) (ref 1.0–2.5)
ALT: 10 U/L (ref 6–29)
AST: 12 U/L (ref 10–35)
Albumin: 3.9 g/dL (ref 3.6–5.1)
Alkaline phosphatase (APISO): 69 U/L (ref 37–153)
BUN: 18 mg/dL (ref 7–25)
CO2: 24 mmol/L (ref 20–32)
Calcium: 8.9 mg/dL (ref 8.6–10.4)
Chloride: 108 mmol/L (ref 98–110)
Creat: 0.74 mg/dL (ref 0.60–0.93)
GFR, Est African American: 92 mL/min/{1.73_m2} (ref >=60)
GFR, Est Non African American: 80 mL/min/{1.73_m2} (ref >=60)
Globulin: 2.3 g/dL (calc) (ref 1.9–3.7)
Glucose, Bld: 135 mg/dL (ref 65–139)
Potassium: 4.2 mmol/L (ref 3.5–5.3)
Sodium: 142 mmol/L (ref 135–146)
Total Bilirubin: 0.4 mg/dL (ref 0.2–1.2)
Total Protein: 6.2 g/dL (ref 6.1–8.1)

## 2019-06-15 LAB — HEMOGLOBIN A1C
Hgb A1c MFr Bld: 7 % of total Hgb — ABNORMAL HIGH (ref <5.7)
Mean Plasma Glucose: 154 (calc)
eAG (mmol/L): 8.5 (calc)

## 2019-07-08 ENCOUNTER — Telehealth (HOSPITAL_COMMUNITY): Payer: Self-pay | Admitting: Cardiology

## 2019-07-08 NOTE — Telephone Encounter (Signed)
Charlene Rubio called with concerns Requested a return call  [facility]

## 2019-07-12 NOTE — Telephone Encounter (Signed)
Multiple attempts/voicemails made to contact Charlene Rubio  Will close encounter until patient/care giver returns call

## 2019-07-26 ENCOUNTER — Other Ambulatory Visit: Payer: Self-pay

## 2019-07-26 ENCOUNTER — Other Ambulatory Visit (HOSPITAL_COMMUNITY): Payer: Self-pay | Admitting: *Deleted

## 2019-07-26 ENCOUNTER — Ambulatory Visit (HOSPITAL_COMMUNITY)
Admission: RE | Admit: 2019-07-26 | Discharge: 2019-07-26 | Disposition: A | Discharge status: Home / Self Care | Payer: Medicare Other | Source: Ambulatory Visit | Attending: Cardiology | Admitting: Cardiology

## 2019-07-26 DIAGNOSIS — I5022 Chronic systolic (congestive) heart failure: Secondary | ICD-10-CM

## 2019-07-26 NOTE — Progress Notes (Signed)
  Echocardiogram 2D Echocardiogram has been performed.  Charlene Rubio [name] 07/26/2019, 12:19 PM

## 2019-07-27 ENCOUNTER — Other Ambulatory Visit: Payer: Self-pay | Admitting: Cardiology

## 2019-07-30 ENCOUNTER — Encounter (HOSPITAL_COMMUNITY): Payer: Self-pay

## 2019-08-05 ENCOUNTER — Other Ambulatory Visit: Payer: Self-pay

## 2019-08-05 ENCOUNTER — Encounter (HOSPITAL_COMMUNITY): Payer: Self-pay | Admitting: Cardiology

## 2019-08-05 ENCOUNTER — Ambulatory Visit (HOSPITAL_COMMUNITY)
Admission: RE | Admit: 2019-08-05 | Discharge: 2019-08-05 | Disposition: A | Discharge status: Home / Self Care | Payer: Medicare Other | Source: Ambulatory Visit | Attending: Cardiology | Admitting: Cardiology

## 2019-08-05 VITALS — BP 128/67 | HR 76 | Wt 183.8 lb

## 2019-08-05 DIAGNOSIS — I5032 Chronic diastolic (congestive) heart failure: Secondary | ICD-10-CM | POA: Diagnosis not present

## 2019-08-05 DIAGNOSIS — I872 Venous insufficiency (chronic) (peripheral): Secondary | ICD-10-CM | POA: Diagnosis not present

## 2019-08-05 DIAGNOSIS — I429 Cardiomyopathy, unspecified: Secondary | ICD-10-CM | POA: Insufficient documentation

## 2019-08-05 DIAGNOSIS — I82502 Chronic embolism and thrombosis of unspecified deep veins of left lower extremity: Secondary | ICD-10-CM | POA: Diagnosis present

## 2019-08-05 DIAGNOSIS — Z818 Family history of other mental and behavioral disorders: Secondary | ICD-10-CM | POA: Insufficient documentation

## 2019-08-05 DIAGNOSIS — Z833 Family history of diabetes mellitus: Secondary | ICD-10-CM | POA: Insufficient documentation

## 2019-08-05 DIAGNOSIS — Z79811 Long term (current) use of aromatase inhibitors: Secondary | ICD-10-CM | POA: Diagnosis not present

## 2019-08-05 DIAGNOSIS — I110 Hypertensive heart disease with heart failure: Secondary | ICD-10-CM | POA: Diagnosis not present

## 2019-08-05 DIAGNOSIS — E119 Type 2 diabetes mellitus without complications: Secondary | ICD-10-CM | POA: Insufficient documentation

## 2019-08-05 DIAGNOSIS — I5022 Chronic systolic (congestive) heart failure: Secondary | ICD-10-CM | POA: Diagnosis not present

## 2019-08-05 DIAGNOSIS — I352 Nonrheumatic aortic (valve) stenosis with insufficiency: Secondary | ICD-10-CM | POA: Diagnosis not present

## 2019-08-05 DIAGNOSIS — F0390 Unspecified dementia without behavioral disturbance: Secondary | ICD-10-CM | POA: Insufficient documentation

## 2019-08-05 DIAGNOSIS — Z794 Long term (current) use of insulin: Secondary | ICD-10-CM | POA: Insufficient documentation

## 2019-08-05 DIAGNOSIS — R001 Bradycardia, unspecified: Secondary | ICD-10-CM | POA: Insufficient documentation

## 2019-08-05 DIAGNOSIS — Z853 Personal history of malignant neoplasm of breast: Secondary | ICD-10-CM | POA: Insufficient documentation

## 2019-08-05 DIAGNOSIS — I5042 Chronic combined systolic (congestive) and diastolic (congestive) heart failure: Secondary | ICD-10-CM | POA: Diagnosis not present

## 2019-08-05 DIAGNOSIS — I825Z2 Chronic embolism and thrombosis of unspecified deep veins of left distal lower extremity: Secondary | ICD-10-CM | POA: Diagnosis not present

## 2019-08-05 DIAGNOSIS — Z7901 Long term (current) use of anticoagulants: Secondary | ICD-10-CM | POA: Insufficient documentation

## 2019-08-05 DIAGNOSIS — Z8249 Family history of ischemic heart disease and other diseases of the circulatory system: Secondary | ICD-10-CM | POA: Diagnosis not present

## 2019-08-05 DIAGNOSIS — Z79899 Other long term (current) drug therapy: Secondary | ICD-10-CM | POA: Insufficient documentation

## 2019-08-05 LAB — BASIC METABOLIC PANEL
Anion gap: 10 (ref 5–15)
BUN: 13 mg/dL (ref 8–23)
CO2: 23 mmol/L (ref 22–32)
Calcium: 8.8 mg/dL — ABNORMAL LOW (ref 8.9–10.3)
Chloride: 110 mmol/L (ref 98–111)
Creatinine, Ser: 0.85 mg/dL (ref 0.44–1.00)
GFR calc Af Amer: >60 mL/min (ref >60)
GFR calc non Af Amer: >60 mL/min (ref >60)
Glucose, Bld: 245 mg/dL — ABNORMAL HIGH (ref 70–99)
Potassium: 3.9 mmol/L (ref 3.5–5.1)
Sodium: 143 mmol/L (ref 135–145)

## 2019-08-05 LAB — CBC
HCT: 40.4 % (ref 36.0–46.0)
Hemoglobin: 13.8 g/dL (ref 12.0–15.0)
MCH: 32.3 pg (ref 26.0–34.0)
MCHC: 34.2 g/dL (ref 30.0–36.0)
MCV: 94.6 fL (ref 80.0–100.0)
Platelets: 188 10*3/uL (ref 150–400)
RBC: 4.27 MIL/uL (ref 3.87–5.11)
RDW: 13.1 % (ref 11.5–15.5)
WBC: 5 10*3/uL (ref 4.0–10.5)
nRBC: 0 % (ref 0.0–0.2)

## 2019-08-05 LAB — BRAIN NATRIURETIC PEPTIDE: B Natriuretic Peptide: 133.6 pg/mL — ABNORMAL HIGH (ref 0.0–100.0)

## 2019-08-05 NOTE — Patient Instructions (Signed)
NO Medication changes today!  Labs today We will only contact you if something comes back abnormal or we need to make some changes. Otherwise no news is good news!  Your physician recommends that you schedule a follow-up appointment in: 6 months. We will call you to schedule this appointment. Please call in April if you haven't received this call.   At the [facility], you and your health needs are our priority. As part of our continuing mission to provide you with exceptional heart care, we have created designated Provider Care Teams. These Care Teams include your primary Cardiologist (physician) and Advanced Practice Providers (APPs- Physician Assistants and Nurse Practitioners) who all work together to provide you with the care you need, when you need it.   You may see any of the following providers on your designated Care Team at your next follow up: Marland Kitchen Dr Glori Bickers . Dr Loralie Champagne . Darrick Grinder, NP . Lyda Jester, PA . Audry Riles, PharmD   Please be sure to bring in all your medications bottles to every appointment.

## 2019-08-05 NOTE — Progress Notes (Signed)
Date:  08/05/2019   ID:  Charlene Rubio, DOB 02-13-1946, MRN [id number]   Provider location: [facility] Type of Visit: Established patient   PCP:  Lauree Chandler, NP  Cardiologist: Dr. Aundra Dubin Oncology: [name]   History of Present Illness: Charlene Rubio is a 73 y.o. female who has a history of breast cancer, chronic systolic CHF, chronic left leg DVT, and dementia.  She was initially referred by [name] for cardio-oncology evaluation given Herceptin use.   Patient went a number of years without accessing medical care.  However, in 9/18, she was admitted with dyspnea and was found to have a CHF exacerbation.  Echo showed EF 25-30%.  She was diuresed.  Cardiolite in 10/18 showed EF 41% with no ischemia.  Blood glucose was also noted to be very high this admission.  She was then diagnosed with breast cancer and had bilateral mastectomies in 1/19.  ER+/PR+/HER2+. Planned for Herceptin starting 2/19 to continue until 1/20.  Radiation 3/19-4/19.   She had an echo in 2/19 suggesting improved EF, 50-55%.  However, repeat echo in 5/19 showed EF 45% with mild AS and moderate AI, normal RV size and systolic function.  Echo in 9/19 showed EF 50% with mild LVH, normal RV size and systolic function, mild AS with mild-moderate aortic insufficiency. She had cardiac MRI in 10/19, showing EF 45% with moderate LV dilation, normal RV size with EF 43%, moderate AI, no LGE.   Echo in 6/20 showed EF 55%, mild RV dilation with normal systolic function, moderate aortic insufficiency.   Patient's husband called in about significant weight gain.  Echo was done today and reviewed, EF remains 60-65% with mild LVH, normal RV size and systolic function, mild aortic stenosis.   History comes from her husband due to dementia.  Weight is up 15 lbs since last appointment.  Husband reports dietary indiscretion and minimal exercise.  No significant exertional dyspnea or chest pain.  She says  that she feels "fine."    Labs (5/19): K 3.4, creatinine 0.74, hgb 12.4 Labs (7/19): K 3.4, creatinine 0.81, LDL 79 Labs (8/19): K 4.3, creatinine 0.78 Labs (12/19): K 3.8, creatinine 0.8, LDL 75 Labs (1/20): K 3.8, creatinine 0.83, hgb 12.7  Labs (10/20): K 4.2, creatinine 0.74, LDL 83  ECG (personally reviewed): NSR, normal  PMH: 1. Breast cancer: Bilateral mastectomies in 1/19.  ER+/PR+/HER2+. Planned for Herceptin starting 2/19 to continue for a year.  Radiation 3/19-4/19.  2. Type II diabetes 3. Chronic DVT left leg: Found in 9/18.  4. Dementia 5. HTN 6. Chronic systolic CHF: ?Etiology.  - Echo (9/18): EF 25-30% - Cardiolite (10/18): EF 41%, no ischemia.  - Echo (2/19): EF 50-55%, mild LV dilation, mild LVH, mild AS, mild-moderate AI.  - Echo (5/19): EF 45%, diffuse hypokinesis, normal RV size and systolic function, mild AS with moderate AI.  - Echo (7/19): EF 45%, mild LV dilation, diffuse hypokinesis, normal RV, moderate AI/mild AS.  - Echo (9/19): EF 50%, mild LVH, diffuse hypokinesis, mild AS and mild-moderate aortic insufficiency.  - Cardiac MRI (10/19): EF 45%, diffuse hypokinesis, moderate LV dilation, normal RV size with EF 43%, moderate AI, no LGE.  - Echo (6/20): EF 55%, mild RV dilation with normal RV systolic function, moderate AI.  - Echo (12/20): EF 60-65%, mild LVH, normal RV size and systolic function, mild aortic stenosis.  7. Aortic stenosis: Mild on 12/20 echo.   Current Outpatient Medications  Medication Sig Dispense Refill  . Blood Glucose Monitoring Suppl (ONE TOUCH ULTRA 2) w/Device KIT Use to test blood sugar 2 times  daily DX: E11.9 1 kit 0  . carvedilol (COREG) 12.5 MG tablet TAKE 1 TABLET (12.5 MG TOTAL) BY MOUTH 2 (TWO) TIMES DAILY WITH A MEAL. 180 tablet 3  . Continuous Blood Gluc Receiver (DEXCOM G6 RECEIVER) DEVI 1 Device by Does not apply route as directed. 1 Device 0  . Continuous Blood Gluc Sensor (DEXCOM G6 SENSOR) MISC 1 Device by Does not  apply route as directed. 3 each 6  . Continuous Blood Gluc Transmit (DEXCOM G6 TRANSMITTER) MISC 1 Device by Does not apply route as directed. 1 each 3  . donepezil (ARICEPT) 10 MG tablet TAKE 1 TABLET BY MOUTH AT  BEDTIME 90 tablet 1  . ELIQUIS 5 MG TABS tablet TAKE 1 TABLET BY MOUTH TWICE A DAY 60 tablet 5  . ENTRESTO 24-26 MG TAKE 1 TABLET BY MOUTH TWICE A DAY 60 tablet 5  . furosemide (LASIX) 80 MG tablet TAKE 1 TABLET BY MOUTH  DAILY 90 tablet 3  . glucose blood (ONETOUCH VERIO) test strip One Touch Verio. Use to test blood sugar twice daily Dx: E11.9 100 each 3  . Insulin Lispro Prot & Lispro (HUMALOG MIX 75/25 KWIKPEN) (75-25) 100 UNIT/ML Kwikpen Inject 12 Units into the skin 2 (two) times daily with a meal. 15 mL 11  . Insulin Pen Needle (PEN NEEDLES) 31G X 5 MM MISC 1 each by Does not apply route 2 (two) times daily. Use twice daily with Insulin Pen. Dx: E11.9 200 each 1  . letrozole (FEMARA) 2.5 MG tablet Take 1 tablet (2.5 mg total) by mouth daily. 90 tablet 3  . memantine (NAMENDA) 10 MG tablet Take 10 mg by mouth 2 (two) times daily.    Glory Rosebush Delica Lancets 46K MISC Use as instructed to test blood sugar 2 times daily DX: E11.9 100 each 0  . potassium chloride SA (KLOR-CON M20) 20 MEQ tablet Take 1 tablet (20 mEq total) by mouth daily. 90 tablet 1  . spironolactone (ALDACTONE) 25 MG tablet Take 12.5 mg by mouth daily.     No current facility-administered medications for this encounter.    Allergies:   Patient has no known allergies.   Social History:  The patient  reports that she has never smoked. She has never used smokeless tobacco. She reports that she does not drink alcohol or use drugs.   Family History:  The patient's family history includes Alcoholism in her brother and brother; Alzheimer's disease in her brother; Alzheimer's disease (age of onset: 13) in her father and mother; Congestive Heart Failure in her father; Dementia in her brother; Diabetes in her brother.    ROS:  Please see the history of present illness.   All other systems are personally reviewed and negative.   Exam:   BP 128/67   Pulse 76   Wt 83.4 kg (183 lb 12.8 oz)   LMP  (LMP Unknown)   SpO2 98%   BMI 29.67 kg/m  General: NAD Neck: No JVD, no thyromegaly or thyroid nodule.  Lungs: Clear to auscultation bilaterally with normal respiratory effort. CV: Nondisplaced PMI.  Heart regular S1/S2, no S3/S4, 2/6 SEM RUSB with clear S2.  No peripheral edema.  No carotid bruit.  Normal pedal pulses.  Abdomen: Soft, nontender, no hepatosplenomegaly, no distention.  Skin: Intact without lesions or rashes.  Neurologic: Alert and oriented x 3.  Psych: Normal affect. Extremities: No clubbing or cyanosis.  HEENT: Normal.    Recent Labs: 03/08/2019: TSH 1.86 06/14/2019: ALT 10 08/05/2019: B Natriuretic Peptide 133.6; BUN 13; Creatinine, Ser 0.85; Hemoglobin 13.8; Platelets 188; Potassium 3.9; Sodium 143  Personally reviewed   Wt Readings from Last 3 Encounters:  08/05/19 83.4 kg (183 lb 12.8 oz)  06/14/19 81.2 kg (179 lb)  04/22/19 80.9 kg (178 lb 4.8 oz)      ASSESSMENT AND PLAN:  1. Chronic left leg DVT: She is on Eliquis.  With cancer diagnosis, will continue chronically.  She has venous insufficiency in the left leg likely related to prior DVT.  - Check CBC with Eliquis use.  2. Chronic systolic CHF: Cardiomyopathy of uncertain etiology. First diagnosed in 9/18 with CHF exacerbation, EF by echo at the time was 25-30%.  Cardiolite in 10/18 showed no ischemia, EF 41%.  Echo in 2/19 was read as showing improved EF, 50-55%.  Echo in 5/19 showed EF about 45%.  Echo in 7/19 showed EF 45% with diffuse hypokinesis.  Echo in 9/19 showed EF 50%, some improvement.  Cardiac MRI in 10/19 showed EF 45%, no LGE, moderate AI.  Echo was done today and reviewed, EF 60-65% with mild AI.  I think that her weight gain is caloric, she is not volume overloaded on exam.  NYHA class II symptoms.  - Continue  Lasix 80 mg daily with KCl 20 daily.  BMET/BNP today.  - Continue Coreg 12.5 mg bid, spironolactone 12.5 daily, and Entresto 24/26 bid.  3. Aortic valve disorder: Mild aortic stenosis on today's echo.     Signed, Loralie Champagne, MD  08/05/2019  [place] [address] [facility] [place] [address] [phone] (office) [phone] (fax)

## 2019-09-26 ENCOUNTER — Ambulatory Visit: Payer: Medicare Other | Attending: Internal Medicine

## 2019-09-26 DIAGNOSIS — Z23 Encounter for immunization: Secondary | ICD-10-CM

## 2019-09-26 NOTE — Progress Notes (Signed)
   Covid-19 Vaccination Clinic  Name:  Charlene Rubio    MRN: [id number] DOB: 20-Aug-1945  09/26/2019  Charlene Rubio was observed post Covid-19 immunization for 15 minutes without incidence. She was provided with Vaccine Information Sheet and instruction to access the V-Safe system.   Charlene Rubio was instructed to call 911 with any severe reactions post vaccine: Marland Kitchen Difficulty breathing  . Swelling of your face and throat  . A fast heartbeat  . A bad rash all over your body  . Dizziness and weakness    Immunizations Administered    Name Date Dose VIS Date Route   Pfizer COVID-19 Vaccine 09/26/2019  2:21 PM 0.3 mL 07/30/2019 Intramuscular   Manufacturer: [place]   Lot: [id number]   [place]: [id number]

## 2019-09-28 ENCOUNTER — Other Ambulatory Visit (HOSPITAL_COMMUNITY): Payer: Self-pay | Admitting: Cardiology

## 2019-09-28 ENCOUNTER — Other Ambulatory Visit: Payer: Self-pay | Admitting: Nurse Practitioner

## 2019-10-02 ENCOUNTER — Other Ambulatory Visit (HOSPITAL_COMMUNITY): Payer: Self-pay | Admitting: Cardiology

## 2019-10-11 ENCOUNTER — Ambulatory Visit: Payer: Medicare Other

## 2019-10-20 ENCOUNTER — Ambulatory Visit: Payer: Medicare Other | Attending: Internal Medicine

## 2019-10-20 ENCOUNTER — Ambulatory Visit: Payer: Medicare Other

## 2019-10-20 DIAGNOSIS — Z23 Encounter for immunization: Secondary | ICD-10-CM

## 2019-10-20 NOTE — Progress Notes (Signed)
   Covid-19 Vaccination Clinic  Name:  Charlene Rubio    MRN: [id number] DOB: 1945-08-29  10/20/2019  Charlene Rubio was observed post Covid-19 immunization for 15 minutes without incident. She was provided with Vaccine Information Sheet and instruction to access the V-Safe system.   Charlene Rubio was instructed to call 911 with any severe reactions post vaccine: Marland Kitchen Difficulty breathing  . Swelling of face and throat  . A fast heartbeat  . A bad rash all over body  . Dizziness and weakness   Immunizations Administered    Name Date Dose VIS Date Route   Pfizer COVID-19 Vaccine 10/20/2019 10:07 AM 0.3 mL 07/30/2019 Intramuscular   Manufacturer: [employer]   Lot: [id number]   [place]: [id number]

## 2019-10-21 ENCOUNTER — Other Ambulatory Visit: Payer: Self-pay

## 2019-10-21 ENCOUNTER — Emergency Department (HOSPITAL_COMMUNITY): Payer: Medicare Other

## 2019-10-21 ENCOUNTER — Encounter (HOSPITAL_COMMUNITY): Payer: Self-pay

## 2019-10-21 ENCOUNTER — Inpatient Hospital Stay (HOSPITAL_COMMUNITY)
Admission: EM | Admit: 2019-10-21 | Discharge: 2019-10-24 | DRG: 181 | Disposition: A | Discharge status: Home Health | Payer: Medicare Other | Attending: Internal Medicine | Admitting: Internal Medicine

## 2019-10-21 DIAGNOSIS — Z8249 Family history of ischemic heart disease and other diseases of the circulatory system: Secondary | ICD-10-CM

## 2019-10-21 DIAGNOSIS — I110 Hypertensive heart disease with heart failure: Secondary | ICD-10-CM | POA: Diagnosis present

## 2019-10-21 DIAGNOSIS — C50911 Malignant neoplasm of unspecified site of right female breast: Secondary | ICD-10-CM | POA: Diagnosis present

## 2019-10-21 DIAGNOSIS — I5042 Chronic combined systolic (congestive) and diastolic (congestive) heart failure: Secondary | ICD-10-CM | POA: Diagnosis present

## 2019-10-21 DIAGNOSIS — C50912 Malignant neoplasm of unspecified site of left female breast: Secondary | ICD-10-CM | POA: Diagnosis not present

## 2019-10-21 DIAGNOSIS — Z833 Family history of diabetes mellitus: Secondary | ICD-10-CM

## 2019-10-21 DIAGNOSIS — Z66 Do not resuscitate: Secondary | ICD-10-CM | POA: Diagnosis not present

## 2019-10-21 DIAGNOSIS — Z8673 Personal history of transient ischemic attack (TIA), and cerebral infarction without residual deficits: Secondary | ICD-10-CM

## 2019-10-21 DIAGNOSIS — F0390 Unspecified dementia without behavioral disturbance: Secondary | ICD-10-CM | POA: Diagnosis present

## 2019-10-21 DIAGNOSIS — Z1611 Resistance to penicillins: Secondary | ICD-10-CM | POA: Diagnosis present

## 2019-10-21 DIAGNOSIS — N390 Urinary tract infection, site not specified: Secondary | ICD-10-CM | POA: Diagnosis present

## 2019-10-21 DIAGNOSIS — C3411 Malignant neoplasm of upper lobe, right bronchus or lung: Secondary | ICD-10-CM | POA: Diagnosis not present

## 2019-10-21 DIAGNOSIS — R52 Pain, unspecified: Secondary | ICD-10-CM | POA: Diagnosis not present

## 2019-10-21 DIAGNOSIS — R279 Unspecified lack of coordination: Secondary | ICD-10-CM | POA: Diagnosis not present

## 2019-10-21 DIAGNOSIS — M6281 Muscle weakness (generalized): Secondary | ICD-10-CM | POA: Diagnosis not present

## 2019-10-21 DIAGNOSIS — Z743 Need for continuous supervision: Secondary | ICD-10-CM | POA: Diagnosis not present

## 2019-10-21 DIAGNOSIS — Z79899 Other long term (current) drug therapy: Secondary | ICD-10-CM

## 2019-10-21 DIAGNOSIS — Z79811 Long term (current) use of aromatase inhibitors: Secondary | ICD-10-CM

## 2019-10-21 DIAGNOSIS — Z820 Family history of epilepsy and other diseases of the nervous system: Secondary | ICD-10-CM | POA: Diagnosis not present

## 2019-10-21 DIAGNOSIS — Z9013 Acquired absence of bilateral breasts and nipples: Secondary | ICD-10-CM

## 2019-10-21 DIAGNOSIS — Z794 Long term (current) use of insulin: Secondary | ICD-10-CM

## 2019-10-21 DIAGNOSIS — R918 Other nonspecific abnormal finding of lung field: Secondary | ICD-10-CM

## 2019-10-21 DIAGNOSIS — E119 Type 2 diabetes mellitus without complications: Secondary | ICD-10-CM | POA: Diagnosis not present

## 2019-10-21 DIAGNOSIS — Z86718 Personal history of other venous thrombosis and embolism: Secondary | ICD-10-CM

## 2019-10-21 DIAGNOSIS — R531 Weakness: Secondary | ICD-10-CM | POA: Diagnosis not present

## 2019-10-21 DIAGNOSIS — Z811 Family history of alcohol abuse and dependence: Secondary | ICD-10-CM | POA: Diagnosis not present

## 2019-10-21 DIAGNOSIS — Z7901 Long term (current) use of anticoagulants: Secondary | ICD-10-CM | POA: Diagnosis not present

## 2019-10-21 DIAGNOSIS — B961 Klebsiella pneumoniae [K. pneumoniae] as the cause of diseases classified elsewhere: Secondary | ICD-10-CM | POA: Diagnosis present

## 2019-10-21 DIAGNOSIS — I428 Other cardiomyopathies: Secondary | ICD-10-CM | POA: Diagnosis present

## 2019-10-21 DIAGNOSIS — R29818 Other symptoms and signs involving the nervous system: Secondary | ICD-10-CM | POA: Diagnosis not present

## 2019-10-21 DIAGNOSIS — R001 Bradycardia, unspecified: Secondary | ICD-10-CM | POA: Diagnosis present

## 2019-10-21 DIAGNOSIS — R0902 Hypoxemia: Secondary | ICD-10-CM | POA: Diagnosis not present

## 2019-10-21 DIAGNOSIS — Z20822 Contact with and (suspected) exposure to covid-19: Secondary | ICD-10-CM | POA: Diagnosis present

## 2019-10-21 LAB — CBC WITH DIFFERENTIAL/PLATELET
Abs Immature Granulocytes: 0.01 10*3/uL (ref 0.00–0.07)
Basophils Absolute: 0 10*3/uL (ref 0.0–0.1)
Basophils Relative: 0 %
Eosinophils Absolute: 0.1 10*3/uL (ref 0.0–0.5)
Eosinophils Relative: 2 %
HCT: 40.2 % (ref 36.0–46.0)
Hemoglobin: 13.5 g/dL (ref 12.0–15.0)
Immature Granulocytes: 0 %
Lymphocytes Relative: 6 %
Lymphs Abs: 0.3 10*3/uL — ABNORMAL LOW (ref 0.7–4.0)
MCH: 32.6 pg (ref 26.0–34.0)
MCHC: 33.6 g/dL (ref 30.0–36.0)
MCV: 97.1 fL (ref 80.0–100.0)
Monocytes Absolute: 0.6 10*3/uL (ref 0.1–1.0)
Monocytes Relative: 11 %
Neutro Abs: 4.2 10*3/uL (ref 1.7–7.7)
Neutrophils Relative %: 81 %
Platelets: 147 10*3/uL — ABNORMAL LOW (ref 150–400)
RBC: 4.14 MIL/uL (ref 3.87–5.11)
RDW: 13.1 % (ref 11.5–15.5)
WBC: 5.1 10*3/uL (ref 4.0–10.5)
nRBC: 0 % (ref 0.0–0.2)

## 2019-10-21 LAB — COMPREHENSIVE METABOLIC PANEL
ALT: 12 U/L (ref 0–44)
AST: 18 U/L (ref 15–41)
Albumin: 3.6 g/dL (ref 3.5–5.0)
Alkaline Phosphatase: 78 U/L (ref 38–126)
Anion gap: 8 (ref 5–15)
BUN: 21 mg/dL (ref 8–23)
CO2: 24 mmol/L (ref 22–32)
Calcium: 8.9 mg/dL (ref 8.9–10.3)
Chloride: 107 mmol/L (ref 98–111)
Creatinine, Ser: 0.77 mg/dL (ref 0.44–1.00)
GFR calc Af Amer: >60 mL/min (ref >60)
GFR calc non Af Amer: >60 mL/min (ref >60)
Glucose, Bld: 126 mg/dL — ABNORMAL HIGH (ref 70–99)
Potassium: 4.5 mmol/L (ref 3.5–5.1)
Sodium: 139 mmol/L (ref 135–145)
Total Bilirubin: 0.9 mg/dL (ref 0.3–1.2)
Total Protein: 6.8 g/dL (ref 6.5–8.1)

## 2019-10-21 LAB — SARS CORONAVIRUS 2 (TAT 6-24 HRS): SARS Coronavirus 2: NEGATIVE

## 2019-10-21 LAB — HEMOGLOBIN A1C
Hgb A1c MFr Bld: 6.2 % — ABNORMAL HIGH (ref 4.8–5.6)
Mean Plasma Glucose: 131.24 mg/dL

## 2019-10-21 LAB — GLUCOSE, CAPILLARY
Glucose-Capillary: 156 mg/dL — ABNORMAL HIGH (ref 70–99)
Glucose-Capillary: 179 mg/dL — ABNORMAL HIGH (ref 70–99)

## 2019-10-21 LAB — TSH: TSH: 1.234 u[IU]/mL (ref 0.350–4.500)

## 2019-10-21 MED ORDER — LETROZOLE 2.5 MG PO TABS
2.5000 mg | ORAL_TABLET | Freq: Every day | ORAL | Status: DC
  Administered 2019-10-21 – 2019-10-24 (×4): 2.5 mg via ORAL
  Filled 2019-10-21 (×4): qty 1

## 2019-10-21 MED ORDER — ACETAMINOPHEN 325 MG PO TABS: 650.0000 mg | ORAL_TABLET | Freq: Four times a day (QID) | ORAL | Status: DC | PRN

## 2019-10-21 MED ORDER — SPIRONOLACTONE 12.5 MG HALF TABLET
12.5000 mg | ORAL_TABLET | Freq: Every day | ORAL | Status: DC
  Administered 2019-10-21 – 2019-10-24 (×4): 12.5 mg via ORAL
  Filled 2019-10-21 (×4): qty 1

## 2019-10-21 MED ORDER — MEMANTINE HCL 10 MG PO TABS
10.0000 mg | ORAL_TABLET | Freq: Two times a day (BID) | ORAL | Status: DC
  Administered 2019-10-21 – 2019-10-24 (×6): 10 mg via ORAL
  Filled 2019-10-21 (×6): qty 1

## 2019-10-21 MED ORDER — INSULIN ASPART 100 UNIT/ML ~~LOC~~ SOLN
0.0000 [IU] | Freq: Every day | SUBCUTANEOUS | Status: DC
  Filled 2019-10-21: qty 0.05

## 2019-10-21 MED ORDER — IOHEXOL 300 MG/ML  SOLN
75.0000 mL | Freq: Once | INTRAMUSCULAR | Status: AC | PRN
  Administered 2019-10-21: 75 mL via INTRAVENOUS

## 2019-10-21 MED ORDER — POTASSIUM CHLORIDE CRYS ER 20 MEQ PO TBCR
20.0000 meq | EXTENDED_RELEASE_TABLET | Freq: Every day | ORAL | Status: DC
  Administered 2019-10-22 – 2019-10-24 (×3): 20 meq via ORAL
  Filled 2019-10-21 (×3): qty 1

## 2019-10-21 MED ORDER — APIXABAN 5 MG PO TABS
5.0000 mg | ORAL_TABLET | Freq: Two times a day (BID) | ORAL | Status: DC
  Administered 2019-10-21 – 2019-10-22 (×2): 5 mg via ORAL
  Filled 2019-10-21 (×2): qty 1

## 2019-10-21 MED ORDER — SODIUM CHLORIDE (PF) 0.9 % IJ SOLN
INTRAMUSCULAR | Status: AC
  Filled 2019-10-21: qty 50

## 2019-10-21 MED ORDER — ONDANSETRON HCL 4 MG/2ML IJ SOLN: 4.0000 mg | Freq: Four times a day (QID) | INTRAMUSCULAR | Status: DC | PRN

## 2019-10-21 MED ORDER — DONEPEZIL HCL 5 MG PO TABS
10.0000 mg | ORAL_TABLET | Freq: Every day | ORAL | Status: DC
  Administered 2019-10-21 – 2019-10-23 (×3): 10 mg via ORAL
  Filled 2019-10-21 (×3): qty 2

## 2019-10-21 MED ORDER — BRIMONIDINE TARTRATE 0.15 % OP SOLN
1.0000 [drp] | Freq: Every day | OPHTHALMIC | Status: DC
  Administered 2019-10-22 – 2019-10-24 (×3): 1 [drp] via OPHTHALMIC
  Filled 2019-10-21: qty 5

## 2019-10-21 MED ORDER — INSULIN ASPART 100 UNIT/ML ~~LOC~~ SOLN
0.0000 [IU] | Freq: Three times a day (TID) | SUBCUTANEOUS | Status: DC
  Administered 2019-10-22 (×2): 1 [IU] via SUBCUTANEOUS
  Administered 2019-10-22: 5 [IU] via SUBCUTANEOUS
  Administered 2019-10-23: 2 [IU] via SUBCUTANEOUS
  Administered 2019-10-23: 3 [IU] via SUBCUTANEOUS
  Filled 2019-10-21: qty 0.09

## 2019-10-21 MED ORDER — FUROSEMIDE 40 MG PO TABS
80.0000 mg | ORAL_TABLET | Freq: Every day | ORAL | Status: DC
  Administered 2019-10-22 – 2019-10-24 (×3): 80 mg via ORAL
  Filled 2019-10-21 (×3): qty 2

## 2019-10-21 MED ORDER — CARVEDILOL 3.125 MG PO TABS
3.1250 mg | ORAL_TABLET | Freq: Two times a day (BID) | ORAL | Status: DC
  Administered 2019-10-21 – 2019-10-23 (×5): 3.125 mg via ORAL
  Filled 2019-10-21 (×5): qty 1

## 2019-10-21 NOTE — ED Notes (Signed)
Husband is at bedside stating pt is unable to walk, she doesn't use a walker either.

## 2019-10-21 NOTE — Progress Notes (Signed)
Patient arrived to floor. Patient remains calm at this time.

## 2019-10-21 NOTE — ED Notes (Signed)
Reminded RN of need for UA.

## 2019-10-21 NOTE — H&P (Signed)
History and Physical  Charlene Rubio XNT:[id number] DOB: September 25, 1945 DOA: 10/21/2019  Referring physician: Dr. Ralene Bathe PCP: Lauree Chandler, NP  Outpatient Specialists: Cardiology, oncology Patient coming from: Home where she lives with her husband.  Chief Complaint: Woke up with new legs weakness.   HPI: Charlene Rubio is a 74 y.o. female with medical history significant for dementia, bilateral breast cancer status post mastectomy in 2018 on Femara, left lower extremity DVT in 2018 on Eliquis, chronic combined systolic and diastolic CHF, insulin-dependent type 2 diabetes, aortic valve disorder, essential hypertension, who presented to [facility] ED accompanied by her husband with sudden onset bilateral lower extremity weakness.  Noted by her husband this morning around 10 AM after she woke up.  Patient had her second shot of COVID-19 vaccine day prior to presentation.  She was in her usual state of health prior to this.  Patient has advanced dementia and is unable to provide a history.  History is mainly obtained from her husband at bedside, EDP, and medical records.  No recent fevers or other complaints, appetite has been good.  At baseline patient ambulates with a walker.  When she woke up this morning, she could not sustain her own weight on both feet and could not ambulate.  This was a drastic change for her.  Her husband brought her in to the ED for further evaluation.  ED Course: Upon presentation to the ED vital signs stable with negative orthostatic vital signs.  Initial chest x-ray showed right upper lobe mass which was confirmed on CT chest.  Lab studies are essentially unremarkable.  Urinalysis pending at the time of this visit.  TRH was asked to admit for further work-up and management.  Review of Systems: Review of systems as noted in the HPI. All other systems reviewed and are negative.   Past Medical History:  Diagnosis Date  . Acute diastolic (congestive) heart failure ([place]) 04/25/2017  .  Acute respiratory failure with hypoxia and hypercapnia ([place]) 04/25/2017  . ALLERGIC RHINITIS 07/01/2007   Qualifier: Diagnosis of  By: Sherren Mocha MD, Jory Ee   . Allergy   . Aortic insufficiency   . Aortic valve disorder 07/01/2007   Qualifier: Diagnosis of  By: Sherren Mocha MD, Jory Ee   . Arthritis   . Bilateral leg edema 04/25/2017  . Borderline diabetic   . Cancer [facility])    Breast cancer  . CHF (congestive heart failure) ([place]) 04/25/2017  . Dementia ([place]) 04/30/2017  . Diabetes mellitus ([place]) 04/25/2017  . DOE (dyspnea on exertion) 04/25/2017  . Essential hypertension 07/01/2007   Qualifier: Diagnosis of  By: Sherren Mocha MD, Jory Ee Glaucoma suspect   . Hypertension   . KNEE PAIN, LEFT 04/06/2010   Qualifier: Diagnosis of  By: Sherren Mocha MD, Jory Ee   . Nonischemic cardiomyopathy ([place])   . Obesity   . Obesity, unspecified 07/01/2007   Qualifier: Diagnosis of  By: Sherren Mocha MD, Jory Ee   . Peripheral vascular disease ([place])    blood clots in legs  . Thyroid disease    Past Surgical History:  Procedure Laterality Date  . CESAREAN SECTION    . MASTECTOMY  08/27/2017   LEFT BREAST MODIFIED RADICAL MASTECTOMY (Left Breast)  . MASTECTOMY  08/27/2017   RIGHT TOTAL MASTECTOMY WITH SENTINEL LYMPH NODE BIOPSY (Right Breast)  . MASTECTOMY W/ SENTINEL NODE BIOPSY Right 08/27/2017   Procedure: RIGHT TOTAL MASTECTOMY WITH SENTINEL LYMPH NODE BIOPSY;  Surgeon: Fanny Skates, MD;  Location: [place];  Service: General;  Laterality: Right;  . MODIFIED MASTECTOMY Left 08/27/2017   Procedure: LEFT BREAST MODIFIED RADICAL MASTECTOMY;  Surgeon: Fanny Skates, MD;  Location: [place];  Service: General;  Laterality: Left;  . PORTACATH PLACEMENT Right 10/09/2017  . PORTACATH PLACEMENT Right 10/09/2017   Procedure: INSERTION PORT-A-CATH WITH ULTRASOUND;  Surgeon: Fanny Skates, MD;  Location: [place];  Service: General;  Laterality: Right;    Social History:  reports that she has never smoked. She has never used smokeless  tobacco. She reports that she does not drink alcohol or use drugs.   No Known Allergies  Family History  Problem Relation Age of Onset  . Alzheimer's disease Mother 95  . Alzheimer's disease Father 75  . Congestive Heart Failure Father   . Alcoholism Brother   . Alzheimer's disease Brother   . Dementia Brother   . Alcoholism Brother   . Diabetes Brother      Prior to Admission medications   Medication Sig Start Date End Date Taking? Authorizing Provider  B-D UF III MINI PEN NEEDLES 31G X 5 MM MISC 1 EACH BY DOES NOT APPLY ROUTE 2 (TWO) TIMES DAILY. USE TWICE DAILY WITH INSULIN PEN. DX: E11.9 09/28/19   Lauree Chandler, NP  carvedilol (COREG) 12.5 MG tablet TAKE 1 TABLET (12.5 MG TOTAL) BY MOUTH 2 (TWO) TIMES DAILY WITH A MEAL. 05/21/19  Yes Larey Dresser, MD  donepezil (ARICEPT) 10 MG tablet TAKE 1 TABLET BY MOUTH AT  BEDTIME Patient taking differently: Take 10 mg by mouth at bedtime.  03/04/19  Yes Eubanks, Carlos American, NP  ELIQUIS 5 MG TABS tablet TAKE 1 TABLET BY MOUTH TWICE A DAY Patient taking differently: Take 5 mg by mouth 2 (two) times daily.  07/27/19  Yes Larey Dresser, MD  ENTRESTO 24-26 MG TAKE 1 TABLET BY MOUTH TWICE A DAY Patient taking differently: Take 1 tablet by mouth 2 (two) times daily. May crush. May be given with or without food. Observe for signs and symptoms of angioedema or hypotension. Entresto (sacubitril/valsartan) should NOT be administered within 36-hours of ACE-inhibitor administration. 10/04/19  Yes Larey Dresser, MD  furosemide (LASIX) 80 MG tablet TAKE 1 TABLET BY MOUTH  DAILY Patient taking differently: Take 80 mg by mouth daily.  01/04/19  Yes Larey Dresser, MD  Insulin Lispro Prot & Lispro (HUMALOG MIX 75/25 KWIKPEN) (75-25) 100 UNIT/ML Kwikpen Inject 12 Units into the skin 2 (two) times daily with a meal. 04/13/19  Yes Shamleffer, Melanie Crazier, MD  KLOR-CON M20 20 MEQ tablet TAKE 1 TABLET BY MOUTH EVERY DAY Patient taking differently:  Take 20 mEq by mouth daily.  09/29/19  Yes Larey Dresser, MD  letrozole [facility]) 2.5 MG tablet Take 1 tablet (2.5 mg total) by mouth daily. 04/22/19  Yes Nicholas Lose, MD  memantine (NAMENDA) 10 MG tablet Take 1 tablet (10 mg total) by mouth 2 (two) times daily. 09/28/19  Yes Lauree Chandler, NP  spironolactone (ALDACTONE) 25 MG tablet TAKE 1/2 TABLET BY MOUTH EVERY DAY Patient taking differently: Take 12.5 mg by mouth daily.  09/29/19  Yes Larey Dresser, MD  ALPHAGAN P 0.1 % SOLN Place 1 drop into both eyes daily. 10/15/19   [provider]  Blood Glucose Monitoring Suppl (ONE TOUCH ULTRA 2) w/Device KIT Use to test blood sugar 2 times  daily DX: E11.9 03/11/19   Ngetich, Dinah C, NP  Continuous Blood Gluc Receiver (DEXCOM G6 RECEIVER) DEVI 1 Device by Does  not apply route as directed. 04/14/19   Shamleffer, Melanie Crazier, MD  Continuous Blood Gluc Sensor (DEXCOM G6 SENSOR) MISC 1 Device by Does not apply route as directed. 04/14/19   Shamleffer, Melanie Crazier, MD  Continuous Blood Gluc Transmit (DEXCOM G6 TRANSMITTER) MISC 1 Device by Does not apply route as directed. 04/14/19   Shamleffer, Melanie Crazier, MD  glucose blood (ONETOUCH VERIO) test strip One Touch Verio. Use to test blood sugar twice daily Dx: E11.9 04/30/19   Shamleffer, Melanie Crazier, MD  OneTouch Delica Lancets 86V MISC Use as instructed to test blood sugar 2 times daily DX: E11.9 03/11/19   Ngetich, Dinah C, NP    Physical Exam: BP (!) 132/59   Pulse (!) 54   Temp 97.9 F (36.6 C) (Oral)   Resp 17   LMP  (LMP Unknown)   SpO2 98%   . General: 74 y.o. year-old female well developed well nourished in no acute distress.  Alert and pleasantly confused. . Cardiovascular: Regular rate and rhythm with no rubs or gallops.  No thyromegaly or JVD.  Grade 3 murmur noted.  Trace lower extremity edema. 2/4 pulses in all 4 extremities. Marland Kitchen Respiratory: Clear to auscultation with no wheezes or rales. Good inspiratory  effort. . Abdomen: Soft nontender nondistended with normal bowel sounds x4 quadrants. . Muskuloskeletal: No cyanosis, clubbing.  Trace lower extremity edema noted bilaterally . Neuro: Unable to follow commands appropriately due to cognitive deficits. . Skin: No ulcerative lesions noted or rashes . Psychiatry: Judgement and insight altered in the setting of advanced dementia. Mood is appropriate for condition and setting          Labs on Admission:  Basic Metabolic Panel: Recent Labs  Lab 10/21/19 1302  NA 139  K 4.5  CL 107  CO2 24  GLUCOSE 126*  BUN 21  CREATININE 0.77  CALCIUM 8.9   Liver Function Tests: Recent Labs  Lab 10/21/19 1302  AST 18  ALT 12  ALKPHOS 78  BILITOT 0.9  PROT 6.8  ALBUMIN 3.6   No results for input(s): LIPASE, AMYLASE in the last 168 hours. No results for input(s): AMMONIA in the last 168 hours. CBC: Recent Labs  Lab 10/21/19 1319  WBC 5.1  NEUTROABS 4.2  HGB 13.5  HCT 40.2  MCV 97.1  PLT 147*   Cardiac Enzymes: No results for input(s): CKTOTAL, CKMB, CKMBINDEX, TROPONINI in the last 168 hours.  BNP (last 3 results) Recent Labs    08/05/19 1215  BNP 133.6*    ProBNP (last 3 results) No results for input(s): PROBNP in the last 8760 hours.  CBG: No results for input(s): GLUCAP in the last 168 hours.  Radiological Exams on Admission: DG Chest 2 View  Result Date: 10/21/2019 CLINICAL DATA:  Altered mental status EXAM: CHEST - 2 VIEW COMPARISON:  10/09/2017 FINDINGS: Interval removal of a right-sided Port-A-Cath. Stable mild cardiomegaly. Aortic calcified and tortuous. Masslike 3.3 x 3.2 cm opacity abutting the pleural surface of the inferolateral aspect of the right upper lobe, new from prior. Left lung is clear. No pleural effusion or pneumothorax. IMPRESSION: 3.3 cm masslike opacity abutting the pleural surface of the inferolateral aspect of the right upper lobe, new from prior. Contrast-enhanced CT of the chest recommended for  further evaluation. Electronically Signed   By: Davina Poke D.O.   On: 10/21/2019 13:07   CT Head Wo Contrast  Result Date: 10/21/2019 CLINICAL DATA:  Altered mental status. Difficulty walking. EXAM: CT HEAD WITHOUT CONTRAST  TECHNIQUE: Contiguous axial images were obtained from the base of the skull through the vertex without intravenous contrast. COMPARISON:  CT scan dated 04/29/2017 FINDINGS: Brain: No evidence of acute infarction, hemorrhage, hydrocephalus, extra-axial collection or mass lesion/mass effect. Diffuse cerebral cortical atrophy with secondary ventricular dilatation. The atrophy is most severe in the temporal lobes. Atrophy has slightly progressed since the prior study. Extensive chronic periventricular white matter lucency with a few old lacunar infarcts in the left basal ganglia. Vascular: No hyperdense vessel or unexpected calcification. Skull: Normal. Negative for fracture or focal lesion. Sinuses/Orbits: Normal. Other: None IMPRESSION: No acute abnormalities. Progressive atrophy. Extensive chronic small vessel ischemic changes. Electronically Signed   By: Lorriane Shire M.D.   On: 10/21/2019 15:11   CT Chest W Contrast  Result Date: 10/21/2019 CLINICAL DATA:  Weakness. Patient received a COVID vaccine yesterday. Right upper lobe mass on conventional radiographs. EXAM: CT CHEST WITH CONTRAST TECHNIQUE: Multidetector CT imaging of the chest was performed during intravenous contrast administration. CONTRAST:  42m OMNIPAQUE IOHEXOL 300 MG/ML  SOLN COMPARISON:  Chest radiograph 10/21/2019 CT chest from 07/22/2017 FINDINGS: Cardiovascular: Atherosclerotic calcification of the aortic arch left anterior descending and circumflex coronary arteries. Mediastinum/Nodes: Right hilar lymph node 2.4 cm in short axis on image 55/2, displacing adjacent pulmonary arterial and pulmonary venous structures. Adjacent right lower paratracheal adenopathy including a node extending anterior to the carina  measuring 1.6 cm in short axis on image 53/2, and an adjacent paratracheal node measuring 1.9 cm short axis on image 51/2. All 3 of these lymph nodes are tangential to each other without intact fat planes in between. However, I do not observe over contralateral thoracic adenopathy. Small type 1 hiatal hernia. Lungs/Pleura: A 4.3 by 3.1 by 3.0 cm solid-appearing mass of the right upper lobe abuts the major fissure, minor fissure, and lateral pleural margin, without compelling findings of adjacent rib destruction or chest wall invasion. There is some mild lobularity along the minor fissure margin of the mass for example on image 18/5, but without definite trans fissural extension. 2 by 3 mm mm right lower lobe subpleural nodule is likely a benign subpleural lymph node on image 82/7, but not entirely specific in this setting. Post radiation therapy related findings anteriorly in the left lung. Upper Abdomen: A 2.2 by 1.7 cm right adrenal mass is present on image 131/2, stable from 07/22/2017. A 1.0 by 1.7 cm stable nodules present in the left adrenal gland (image 127/2). Because these nodules were present in 2018 but the lung mass was not, these are likely benign lesions such as adenomas. Musculoskeletal: The mass is adjacent to the right fifth and sixth ribs, but no rib involvement or rib destruction is currently identified. Thoracic kyphosis is present along with levoconvex lower thoracic scoliosis. Thoracic spondylosis noted. Bilateral mastectomies noted. IMPRESSION: 1. 4.3 cm solid right upper lobe mass abutting the major fissure, minor fissure, and lateral pleural margin, without chest wall invasion. There is associated right hilar and paratracheal adenopathy including a lymph node abutting the carina, but no contralateral adenopathy. Assuming non-small cell lung cancer this likely represents T2b N2 M0 disease (stage IIIA). 2. Bilateral adrenal masses are stable from, but the lung mass was not present-this  indicates that the adrenal masses are likely benign. 3.  Aortic Atherosclerosis (ICD10-I70.0).  Coronary atherosclerosis. Electronically Signed   By: WVan ClinesM.D.   On: 10/21/2019 15:38    EKG: I independently viewed the EKG done and my findings are as followed: Sinus rhythm  rate of 63.  No specific ST-T changes.  Assessment/Plan Present on Admission: . Mass of right lung  Active Problems:   Mass of right lung  Acute bilateral lower extremity weakness, unclear etiology Obtain urine analysis, TSH Negative orthostatic vital signs CT head no acute abnormalities, extensive chronic small vessel ischemic changes with progressive atrophy. Apply fall precautions  New right upper lobe mass with suspicion for malignancy, incidentally found on chest x-ray and CT scan Personally reviewed imagings, chest x-ray and CT scan, which showed findings as stated below. 4.3 cm solid right upper lobe mass reviewed in the major fissure, minor fissure and lateral pleural margin without chest wall invasion.  There is associated right hilar and paratracheal adenopathy including the lymph node of urinary to carina but no contralateral adenopathy.  Assuming non-small cell lung cancer this likely represents T2b N2 M0 disease (stage IIIA). Patient followed outpatient by [name] for her bilateral breast cancer, please contact him in the morning.  May need biopsy and further imaging for staging.  Defer to oncology. Respiratory status stable, O2 saturation 98% on room air.  Insulin-dependent type 2 diabetes, controlled Obtain A1c Start insulin sliding scale  Dementia Husband patient has a tendency to pull out IVs He is okay with using bilateral mittens for patient's own safety Resume home medications  History of bilateral breast cancer status post mastectomy Follows with [name] Continue Femara  History of left lower extremity DVT Resume Eliquis  Chronic combined systolic and diastolic  CHF Last 2D echo on record done on 07/26/2019 showed preserved LVEF 60 to 65% with grade 1 diastolic dysfunction Unclear if she had repeated echo done in the outpatient setting with her cardiologist Resume home medications; may need to hold off Entresto to avoid hypotension Start strict I's and O's and daily weight  Essential hypertension Blood pressure is currently at goal Resume home medication and closely monitor vital signs  Ambulatory dysfunction PT OT to assess tomorrow Fall precautions     DVT prophylaxis: Eliquis  Code Status: DNR as stated by her husband.  She has an advanced directive at home.  Family Communication: Discussed plan with her husband at bedside.  Disposition Plan: Admit to telemetry unit  Consults called: None called.  Please consult oncology in the morning.  Admission status: Inpatient status.    Kayleen Memos MD [facility] Pager [phone]  If 7PM-7AM, please contact night-coverage [email or web address] Password [facility]  10/21/2019, 5:48 PM

## 2019-10-21 NOTE — ED Notes (Signed)
Pt is unable to stand for orthostatic vitals, I managed to get the lying down and sitting orthostatic vital signs.

## 2019-10-21 NOTE — ED Provider Notes (Signed)
[facility] DEPT Provider Note   CSN: [id number] Arrival date & time: 10/21/19  1157     History Chief Complaint  Patient presents with  . Weakness    Charlene Rubio is a 74 y.o. female.  The history is provided by the patient, the spouse and medical records. No language interpreter was used.  Weakness  Charlene Rubio is a 74 y.o. female who presents to the Emergency Department complaining of weakness. Level V caveat due to dementia.  Hx is provided by the patient's husband. She had the second covid 19 pfizer vaccine yesterday.  She lives at home with her husband.  This morning she woke up late (around 10, which is an extra hour of sleep).  She was having difficulty walking and standing up.  At baseline she needs to hold onto objects to walk or use a walker.  He states that this is a drastic change.  She slowly went to the floor with his assistance when he attempted to get her to the commode.  She did not get injured. He called EMS because he could not get her back up.  No reports of fevers, vomiting, diarrhea, cough.  She has not taken any medications or eaten today.      Past Medical History:  Diagnosis Date  . Acute diastolic (congestive) heart failure ([place]) 04/25/2017  . Acute respiratory failure with hypoxia and hypercapnia ([place]) 04/25/2017  . ALLERGIC RHINITIS 07/01/2007   Qualifier: Diagnosis of  By: Sherren Mocha MD, Jory Ee   . Allergy   . Aortic insufficiency   . Aortic valve disorder 07/01/2007   Qualifier: Diagnosis of  By: Sherren Mocha MD, Jory Ee   . Arthritis   . Bilateral leg edema 04/25/2017  . Borderline diabetic   . Cancer [facility])    Breast cancer  . CHF (congestive heart failure) ([place]) 04/25/2017  . Dementia ([place]) 04/30/2017  . Diabetes mellitus ([place]) 04/25/2017  . DOE (dyspnea on exertion) 04/25/2017  . Essential hypertension 07/01/2007   Qualifier: Diagnosis of  By: Sherren Mocha MD, Jory Ee Glaucoma suspect   . Hypertension   . KNEE PAIN, LEFT 04/06/2010    Qualifier: Diagnosis of  By: Sherren Mocha MD, Jory Ee   . Nonischemic cardiomyopathy ([place])   . Obesity   . Obesity, unspecified 07/01/2007   Qualifier: Diagnosis of  By: Sherren Mocha MD, Jory Ee   . Peripheral vascular disease ([place])    blood clots in legs  . Thyroid disease     Patient Active Problem List   Diagnosis Date Noted  . Chronic combined systolic and diastolic congestive heart failure ([place]) 03/20/2018  . Genetic testing 12/12/2017  . Port-A-Cath in place 11/25/2017  . Bilateral breast cancer ([place]) 08/27/2017  . 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 Malignant neoplasm of upper-inner quadrant of left breast in female, estrogen receptor positive ([place]) 07/14/2017  . 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 Memory loss or impairment 05/21/2017  . Chronic deep vein thrombosis (DVT) of left lower extremity ([place]) 05/04/2017  . Thrombocytopenia ([place]) 05/04/2017  . Failure to thrive in adult 05/04/2017  . Breast mass, left 05/04/2017  . Dementia ([place]) 04/30/2017  . DOE (dyspnea on exertion) 04/25/2017  . Bilateral leg edema 04/25/2017  . Acquired leg deformity 04/25/2017  . Thyroid disease 04/25/2017  . Noncompliance 04/25/2017  . Anxiety about health 04/25/2017  . CHF (congestive heart  failure) ([place]) 04/25/2017  . Acute respiratory failure with hypoxia and hypercapnia ([place]) 04/25/2017  . Acute diastolic (congestive) heart failure ([place]) 04/25/2017  . Diabetes mellitus ([place]) 04/25/2017  . Hearing loss secondary to cerumen impaction 12/23/2012  . KNEE PAIN, LEFT 04/06/2010  . Obesity, unspecified 07/01/2007  . Aortic valve disorder 07/01/2007  . ALLERGIC RHINITIS 07/01/2007  . Essential hypertension 07/01/2007    Past Surgical History:  Procedure Laterality Date  . CESAREAN SECTION    . MASTECTOMY  08/27/2017   LEFT BREAST MODIFIED RADICAL MASTECTOMY (Left Breast)  . MASTECTOMY   08/27/2017   RIGHT TOTAL MASTECTOMY WITH SENTINEL LYMPH NODE BIOPSY (Right Breast)  . MASTECTOMY W/ SENTINEL NODE BIOPSY Right 08/27/2017   Procedure: RIGHT TOTAL MASTECTOMY WITH SENTINEL LYMPH NODE BIOPSY;  Surgeon: Fanny Skates, MD;  Location: [facility];  Service: General;  Laterality: Right;  . MODIFIED MASTECTOMY Left 08/27/2017   Procedure: LEFT BREAST MODIFIED RADICAL MASTECTOMY;  Surgeon: Fanny Skates, MD;  Location: [facility];  Service: General;  Laterality: Left;  . PORTACATH PLACEMENT Right 10/09/2017  . PORTACATH PLACEMENT Right 10/09/2017   Procedure: INSERTION PORT-A-CATH WITH ULTRASOUND;  Surgeon: Fanny Skates, MD;  Location: [facility];  Service: General;  Laterality: Right;     OB History   No obstetric history on file.     Family History  Problem Relation Age of Onset  . Alzheimer's disease Mother 56  . Alzheimer's disease Father 34  . Congestive Heart Failure Father   . Alcoholism Brother   . Alzheimer's disease Brother   . Dementia Brother   . Alcoholism Brother   . Diabetes Brother     Social History   Tobacco Use  . Smoking status: Never Smoker  . Smokeless tobacco: Never Used  Substance Use Topics  . Alcohol use: No  . Drug use: No    Home Medications Prior to Admission medications   Medication Sig Start Date End Date Taking? Authorizing Provider  B-D UF III MINI PEN NEEDLES 31G X 5 MM MISC 1 EACH BY DOES NOT APPLY ROUTE 2 (TWO) TIMES DAILY. USE TWICE DAILY WITH INSULIN PEN. DX: E11.9 09/28/19   Lauree Chandler, NP  carvedilol (COREG) 12.5 MG tablet TAKE 1 TABLET (12.5 MG TOTAL) BY MOUTH 2 (TWO) TIMES DAILY WITH A MEAL. 05/21/19  Yes Larey Dresser, MD  donepezil (ARICEPT) 10 MG tablet TAKE 1 TABLET BY MOUTH AT  BEDTIME Patient taking differently: Take 10 mg by mouth at bedtime.  03/04/19  Yes Eubanks, Carlos American, NP  ELIQUIS 5 MG TABS tablet TAKE 1 TABLET BY MOUTH TWICE A DAY Patient taking differently: Take 5 mg by mouth 2 (two) times daily.  07/27/19  Yes  Larey Dresser, MD  ENTRESTO 24-26 MG TAKE 1 TABLET BY MOUTH TWICE A DAY Patient taking differently: Take 1 tablet by mouth 2 (two) times daily. May crush. May be given with or without food. Observe for signs and symptoms of angioedema or hypotension. Entresto (sacubitril/valsartan) should NOT be administered within 36-hours of ACE-inhibitor administration. 10/04/19  Yes Larey Dresser, MD  furosemide (LASIX) 80 MG tablet TAKE 1 TABLET BY MOUTH  DAILY Patient taking differently: Take 80 mg by mouth daily.  01/04/19  Yes Larey Dresser, MD  Insulin Lispro Prot & Lispro (HUMALOG MIX 75/25 KWIKPEN) (75-25) 100 UNIT/ML Kwikpen Inject 12 Units into the skin 2 (two) times daily with a meal. 04/13/19  Yes Shamleffer, Melanie Crazier, MD  KLOR-CON M20 20 MEQ tablet TAKE  1 TABLET BY MOUTH EVERY DAY Patient taking differently: Take 20 mEq by mouth daily.  09/29/19  Yes Larey Dresser, MD  letrozole [facility]) 2.5 MG tablet Take 1 tablet (2.5 mg total) by mouth daily. 04/22/19  Yes Nicholas Lose, MD  memantine (NAMENDA) 10 MG tablet Take 1 tablet (10 mg total) by mouth 2 (two) times daily. 09/28/19  Yes Lauree Chandler, NP  spironolactone (ALDACTONE) 25 MG tablet TAKE 1/2 TABLET BY MOUTH EVERY DAY Patient taking differently: Take 12.5 mg by mouth daily.  09/29/19  Yes Larey Dresser, MD  ALPHAGAN P 0.1 % SOLN Place 1 drop into both eyes daily. 10/15/19   [provider]  Blood Glucose Monitoring Suppl (ONE TOUCH ULTRA 2) w/Device KIT Use to test blood sugar 2 times  daily DX: E11.9 03/11/19   Ngetich, Dinah C, NP  Continuous Blood Gluc Receiver (DEXCOM G6 RECEIVER) DEVI 1 Device by Does not apply route as directed. 04/14/19   Shamleffer, Melanie Crazier, MD  Continuous Blood Gluc Sensor (DEXCOM G6 SENSOR) MISC 1 Device by Does not apply route as directed. 04/14/19   Shamleffer, Melanie Crazier, MD  Continuous Blood Gluc Transmit (DEXCOM G6 TRANSMITTER) MISC 1 Device by Does not apply route as directed.  04/14/19   Shamleffer, Melanie Crazier, MD  glucose blood (ONETOUCH VERIO) test strip One Touch Verio. Use to test blood sugar twice daily Dx: E11.9 04/30/19   Shamleffer, Melanie Crazier, MD  OneTouch Delica Lancets 50D MISC Use as instructed to test blood sugar 2 times daily DX: E11.9 03/11/19   Ngetich, Nelda Bucks, NP    Allergies    Patient has no known allergies.  Review of Systems   Review of Systems  Neurological: Positive for weakness.  All other systems reviewed and are negative.   Physical Exam Updated Vital Signs BP (!) 132/59   Pulse (!) 54   Temp 97.9 F (36.6 C) (Oral)   Resp 17   LMP  (LMP Unknown)   SpO2 98%   Physical Exam Vitals and nursing note reviewed.  Constitutional:      Appearance: She is well-developed.  HENT:     Head: Normocephalic and atraumatic.  Cardiovascular:     Rate and Rhythm: Normal rate and regular rhythm.     Heart sounds: No murmur.  Pulmonary:     Effort: Pulmonary effort is normal. No respiratory distress.     Breath sounds: Normal breath sounds.  Abdominal:     Palpations: Abdomen is soft.     Tenderness: There is no abdominal tenderness. There is no guarding or rebound.  Musculoskeletal:        General: No tenderness.     Comments: 2+ DP pulses bilaterally.  No lower extremity tenderness to palpation.  Easily ranges bilateral hips, knees.  Skin:    General: Skin is warm and dry.  Neurological:     Mental Status: She is alert.     Comments: Disoriented to time and recent events.  Awake and alert. PERRL, EOMI. No asymmetry of facial movements.    5/5 strength in all four extremities.    Psychiatric:        Behavior: Behavior normal.     ED Results / Procedures / Treatments   Labs (all labs ordered are listed, but only abnormal results are displayed) Labs Reviewed  COMPREHENSIVE METABOLIC PANEL - Abnormal; Notable for the following components:      Result Value   Glucose, Bld 126 (*)  All other components within normal  limits  CBC WITH DIFFERENTIAL/PLATELET - Abnormal; Notable for the following components:   Platelets 147 (*)    Lymphs Abs 0.3 (*)    All other components within normal limits  URINE CULTURE  SARS CORONAVIRUS 2 (TAT 6-24 HRS)  CBC WITH DIFFERENTIAL/PLATELET  URINALYSIS, ROUTINE W REFLEX MICROSCOPIC  CBG MONITORING, ED    EKG EKG Interpretation  Date/Time:  Thursday October 21 2019 12:09:16 EST Ventricular Rate:  63 PR Interval:    QRS Duration: 98 QT Interval:  442 QTC Calculation: 453 R Axis:   14 Text Interpretation: Sinus rhythm Consider anterior infarct No significant change since last tracing Confirmed by Quintella Reichert [phone]) on 10/21/2019 12:53:17 PM   Radiology DG Chest 2 View  Result Date: 10/21/2019 CLINICAL DATA:  Altered mental status EXAM: CHEST - 2 VIEW COMPARISON:  10/09/2017 FINDINGS: Interval removal of a right-sided Port-A-Cath. Stable mild cardiomegaly. Aortic calcified and tortuous. Masslike 3.3 x 3.2 cm opacity abutting the pleural surface of the inferolateral aspect of the right upper lobe, new from prior. Left lung is clear. No pleural effusion or pneumothorax. IMPRESSION: 3.3 cm masslike opacity abutting the pleural surface of the inferolateral aspect of the right upper lobe, new from prior. Contrast-enhanced CT of the chest recommended for further evaluation. Electronically Signed   By: Davina Poke D.O.   On: 10/21/2019 13:07   CT Head Wo Contrast  Result Date: 10/21/2019 CLINICAL DATA:  Altered mental status. Difficulty walking. EXAM: CT HEAD WITHOUT CONTRAST TECHNIQUE: Contiguous axial images were obtained from the base of the skull through the vertex without intravenous contrast. COMPARISON:  CT scan dated 04/29/2017 FINDINGS: Brain: No evidence of acute infarction, hemorrhage, hydrocephalus, extra-axial collection or mass lesion/mass effect. Diffuse cerebral cortical atrophy with secondary ventricular dilatation. The atrophy is most severe in the temporal  lobes. Atrophy has slightly progressed since the prior study. Extensive chronic periventricular white matter lucency with a few old lacunar infarcts in the left basal ganglia. Vascular: No hyperdense vessel or unexpected calcification. Skull: Normal. Negative for fracture or focal lesion. Sinuses/Orbits: Normal. Other: None IMPRESSION: No acute abnormalities. Progressive atrophy. Extensive chronic small vessel ischemic changes. Electronically Signed   By: Lorriane Shire M.D.   On: 10/21/2019 15:11   CT Chest W Contrast  Result Date: 10/21/2019 CLINICAL DATA:  Weakness. Patient received a COVID vaccine yesterday. Right upper lobe mass on conventional radiographs. EXAM: CT CHEST WITH CONTRAST TECHNIQUE: Multidetector CT imaging of the chest was performed during intravenous contrast administration. CONTRAST:  8m OMNIPAQUE IOHEXOL 300 MG/ML  SOLN COMPARISON:  Chest radiograph 10/21/2019 CT chest from 07/22/2017 FINDINGS: Cardiovascular: Atherosclerotic calcification of the aortic arch left anterior descending and circumflex coronary arteries. Mediastinum/Nodes: Right hilar lymph node 2.4 cm in short axis on image 55/2, displacing adjacent pulmonary arterial and pulmonary venous structures. Adjacent right lower paratracheal adenopathy including a node extending anterior to the carina measuring 1.6 cm in short axis on image 53/2, and an adjacent paratracheal node measuring 1.9 cm short axis on image 51/2. All 3 of these lymph nodes are tangential to each other without intact fat planes in between. However, I do not observe over contralateral thoracic adenopathy. Small type 1 hiatal hernia. Lungs/Pleura: A 4.3 by 3.1 by 3.0 cm solid-appearing mass of the right upper lobe abuts the major fissure, minor fissure, and lateral pleural margin, without compelling findings of adjacent rib destruction or chest wall invasion. There is some mild lobularity along the minor fissure margin of  the mass for example on image 18/5, but  without definite trans fissural extension. 2 by 3 mm mm right lower lobe subpleural nodule is likely a benign subpleural lymph node on image 82/7, but not entirely specific in this setting. Post radiation therapy related findings anteriorly in the left lung. Upper Abdomen: A 2.2 by 1.7 cm right adrenal mass is present on image 131/2, stable from 07/22/2017. A 1.0 by 1.7 cm stable nodules present in the left adrenal gland (image 127/2). Because these nodules were present in 2018 but the lung mass was not, these are likely benign lesions such as adenomas. Musculoskeletal: The mass is adjacent to the right fifth and sixth ribs, but no rib involvement or rib destruction is currently identified. Thoracic kyphosis is present along with levoconvex lower thoracic scoliosis. Thoracic spondylosis noted. Bilateral mastectomies noted. IMPRESSION: 1. 4.3 cm solid right upper lobe mass abutting the major fissure, minor fissure, and lateral pleural margin, without chest wall invasion. There is associated right hilar and paratracheal adenopathy including a lymph node abutting the carina, but no contralateral adenopathy. Assuming non-small cell lung cancer this likely represents T2b N2 M0 disease (stage IIIA). 2. Bilateral adrenal masses are stable from, but the lung mass was not present-this indicates that the adrenal masses are likely benign. 3.  Aortic Atherosclerosis (ICD10-I70.0).  Coronary atherosclerosis. Electronically Signed   By: Van Clines M.D.   On: 10/21/2019 15:38    Procedures Procedures (including critical care time)  Medications Ordered in ED Medications  sodium chloride (PF) 0.9 % injection (has no administration in time range)  iohexol (OMNIPAQUE) 300 MG/ML solution 75 mL (75 mLs Intravenous Contrast Given 10/21/19 1446)    ED Course  I have reviewed the triage vital signs and the nursing notes.  Pertinent labs & imaging results that were available during my care of the patient were reviewed  by me and considered in my medical decision making (see chart for details).    MDM Rules/Calculators/A&P                     Patient with history of dementia, breast cancer here for evaluation of inability to walk after receiving her second COVID-19 vaccine yesterday. She is unable to stand in the emergency department. She is pleasantly confused with no focal neurologic deficits. X-ray demonstrates new lung mass, confirmed on CT scan. Patient is unable to stand her ambulate at her baseline, hospitalist consulted for admission for further workup.  Final Clinical Impression(s) / ED Diagnoses Final diagnoses:  None    Rx / DC Orders ED Discharge Orders    None       Quintella Reichert, MD 10/21/19 1606

## 2019-10-21 NOTE — ED Triage Notes (Signed)
EMS reports from home, husband states weakness post second Covid vaccine yesterday. Pt c/o right leg pain, no fall, no obvious injury. Husband stated having hard time getting Pt around house and could no longer take care of her. Hx of Dementia.  BP 156/80 HR 74 RR 16 Sp02 97 RA CBG 154 Temp 97.7

## 2019-10-21 NOTE — Progress Notes (Signed)
When bed requested, patient was listed as PUI.  Confirmed with Dr. Nevada Crane that patient is NOT a PUI.  Admission ordered updated to asymptomatic protocol.  Patient also has dementia and pulls at tele, IV lines, etc.  OK given by [facility] to allow husband to stay overnight at bedside

## 2019-10-22 ENCOUNTER — Other Ambulatory Visit: Payer: Self-pay | Admitting: *Deleted

## 2019-10-22 ENCOUNTER — Other Ambulatory Visit: Payer: Self-pay | Admitting: Adult Health

## 2019-10-22 ENCOUNTER — Other Ambulatory Visit: Payer: Self-pay | Admitting: Internal Medicine

## 2019-10-22 DIAGNOSIS — E2839 Other primary ovarian failure: Secondary | ICD-10-CM

## 2019-10-22 DIAGNOSIS — E118 Type 2 diabetes mellitus with unspecified complications: Secondary | ICD-10-CM

## 2019-10-22 DIAGNOSIS — R918 Other nonspecific abnormal finding of lung field: Secondary | ICD-10-CM

## 2019-10-22 LAB — URINALYSIS, ROUTINE W REFLEX MICROSCOPIC
Bilirubin Urine: NEGATIVE
Glucose, UA: 50 mg/dL — AB
Ketones, ur: NEGATIVE mg/dL
Nitrite: NEGATIVE
Protein, ur: NEGATIVE mg/dL
Specific Gravity, Urine: 1.014 (ref 1.005–1.030)
pH: 6 (ref 5.0–8.0)

## 2019-10-22 LAB — CBC
HCT: 38.3 % (ref 36.0–46.0)
Hemoglobin: 12.8 g/dL (ref 12.0–15.0)
MCH: 32.5 pg (ref 26.0–34.0)
MCHC: 33.4 g/dL (ref 30.0–36.0)
MCV: 97.2 fL (ref 80.0–100.0)
Platelets: 139 10*3/uL — ABNORMAL LOW (ref 150–400)
RBC: 3.94 MIL/uL (ref 3.87–5.11)
RDW: 12.8 % (ref 11.5–15.5)
WBC: 3.5 10*3/uL — ABNORMAL LOW (ref 4.0–10.5)
nRBC: 0 % (ref 0.0–0.2)

## 2019-10-22 LAB — GLUCOSE, CAPILLARY
Glucose-Capillary: 140 mg/dL — ABNORMAL HIGH (ref 70–99)
Glucose-Capillary: 150 mg/dL — ABNORMAL HIGH (ref 70–99)
Glucose-Capillary: 201 mg/dL — ABNORMAL HIGH (ref 70–99)
Glucose-Capillary: 126 mg/dL — ABNORMAL HIGH (ref 70–99)

## 2019-10-22 LAB — BASIC METABOLIC PANEL
Anion gap: 9 (ref 5–15)
BUN: 18 mg/dL (ref 8–23)
CO2: 24 mmol/L (ref 22–32)
Calcium: 8.8 mg/dL — ABNORMAL LOW (ref 8.9–10.3)
Chloride: 107 mmol/L (ref 98–111)
Creatinine, Ser: 0.67 mg/dL (ref 0.44–1.00)
GFR calc Af Amer: >60 mL/min (ref >60)
GFR calc non Af Amer: >60 mL/min (ref >60)
Glucose, Bld: 129 mg/dL — ABNORMAL HIGH (ref 70–99)
Potassium: 4 mmol/L (ref 3.5–5.1)
Sodium: 140 mmol/L (ref 135–145)

## 2019-10-22 LAB — MAGNESIUM: Magnesium: 2.4 mg/dL (ref 1.7–2.4)

## 2019-10-22 LAB — CK: Total CK: 47 U/L (ref 38–234)

## 2019-10-22 LAB — PHOSPHORUS: Phosphorus: 3.9 mg/dL (ref 2.5–4.6)

## 2019-10-22 MED ORDER — CEPHALEXIN 500 MG PO CAPS
500.0000 mg | ORAL_CAPSULE | Freq: Three times a day (TID) | ORAL | Status: DC
  Administered 2019-10-22 – 2019-10-24 (×5): 500 mg via ORAL
  Filled 2019-10-22 (×5): qty 1

## 2019-10-22 MED ORDER — SACUBITRIL-VALSARTAN 24-26 MG PO TABS
1.0000 | ORAL_TABLET | Freq: Two times a day (BID) | ORAL | Status: DC
  Administered 2019-10-22 – 2019-10-24 (×5): 1 via ORAL
  Filled 2019-10-22 (×6): qty 1

## 2019-10-22 MED ORDER — SODIUM CHLORIDE 0.9 % IV SOLN
1.0000 g | INTRAVENOUS | Status: DC
  Filled 2019-10-22: qty 10

## 2019-10-22 MED ORDER — APIXABAN 5 MG PO TABS: 5.0000 mg | ORAL_TABLET | Freq: Two times a day (BID) | ORAL | Status: DC

## 2019-10-22 MED ORDER — APIXABAN 5 MG PO TABS
5.0000 mg | ORAL_TABLET | Freq: Two times a day (BID) | ORAL | Status: DC
  Administered 2019-10-22 – 2019-10-24 (×4): 5 mg via ORAL
  Filled 2019-10-22 (×4): qty 1

## 2019-10-22 NOTE — Progress Notes (Signed)
Order placed for outpatient lung biopsy per [name] and Dr. Jacqualyn Posey.  Charlene Bihari, NP

## 2019-10-22 NOTE — Evaluation (Signed)
Physical Therapy Evaluation Patient Details Name: Charlene Rubio MRN: [id number] DOB: 11-01-1945 Today's Date: 10/22/2019   History of Present Illness  Charlene Rubio is a 74 y.o. female with medical history significant for dementia, bilateral breast cancer status post mastectomy in 2018 on Femara, left lower extremity DVT in 2018 on Eliquis, chronic combined systolic and diastolic CHF, insulin-dependent type 2 diabetes, aortic valve disorder, essential hypertension, who presented to [facility] ED accompanied by her husband with sudden onset bilateral lower extremity weakness. Acute dx Mass of R lung     Clinical Impression  Charlene Rubio is 74 y.o. female admitted with above HPI and diagnosis. Patient is currently limited by functional impairments below (see PT problem list). Patient lives with her husband and has required increased assistance for ADL's and mobility with rollator over the last month. Patient will benefit from continued skilled PT interventions to address impairments and progress independence with mobility, recommending SNF and 24/7 assist. Acute PT will follow and progress as able.     Follow Up Recommendations SNF;Supervision/Assistance - 24 hour    Equipment Recommendations  None recommended by PT    Recommendations for Other Services       Precautions / Restrictions Precautions Precautions: Fall Restrictions Weight Bearing Restrictions: No      Mobility  Bed Mobility Overal bed mobility: Needs Assistance Bed Mobility: Supine to Sit     Supine to sit: Max assist;HOB elevated     General bed mobility comments: cues for use of bed rail to assist, pt unable to follow cues consitently, max assit required to bring Rt LE off EOB and pivot/scoot forward using bed pad.  Transfers Overall transfer level: Needs assistance Equipment used: Rolling walker (2 wheeled) Transfers: Sit to/from Stand Sit to Stand: Mod assist;+2 physical assistance;+2 safety/equipment Stand pivot  transfers: Mod assist;+2 physical assistance;+2 safety/equipment       General transfer comment: verbal/tactile cues for safe hand placement to initiate power up to RW, mod assist +2 for power up and to steady with rising.   Ambulation/Gait Ambulation/Gait assistance: +2 safety/equipment;+2 physical assistance;Mod assist Gait Distance (Feet): 8 Feet Assistive device: Rolling walker (2 wheeled) Gait Pattern/deviations: Step-to pattern;Shuffle;Decreased stride length;Trunk flexed Gait velocity: decreased Gait velocity interpretation: <1.31 ft/sec, indicative of household ambulator General Gait Details: pt required tactile cues for safe hand placement and cues to sequence step pattern. Mod assist to maintain balance and verbal/tactile cues for posture provided throughout.  Stairs       Wheelchair Mobility    Modified Rankin (Stroke Patients Only)       Balance Overall balance assessment: Needs assistance   Sitting balance-Leahy Scale: Fair     Standing balance support: Bilateral upper extremity supported;During functional activity Standing balance-Leahy Scale: Poor            Pertinent Vitals/Pain Pain Assessment: Faces Faces Pain Scale: No hurt(pt cannot provide number) Pain Intervention(s): Monitored during session    Home Living Family/patient expects to be discharged to:: Private residence Living Arrangements: Spouse/significant other Available Help at Discharge: Family Type of Home: House Home Access: Stairs to enter Entrance Stairs-Rails: Right Entrance Stairs-Number of Steps: 4 Home Layout: One level Home Equipment: Tub bench;Wheelchair - [employer] - 4 wheels Additional Comments: toilet right across from sink to pull up from    Prior Function Level of Independence: Independent with assistive device(s);Needs assistance   Gait / Transfers Assistance Needed: pt has been using rollator/RW for household mobiltiy or furniture walking if she did not take  it  with her  ADL's / Homemaking Assistance Needed: pt's husband is doing all homemaking and assisting with ADL's, assisting her to bath on tub bench and with dressing. has noticed a decresaed in independence over the last month and pt has become incontinent in last month.   Comments: gradual decline over last 4 weeks with greatest decline in last week. husband reports prior to [place] last spring pt was walking in community and able to walk in grocery store, but staying at home has left her less mobile.     Hand Dominance   Dominant Hand: Right    Extremity/Trunk Assessment   Upper Extremity Assessment Upper Extremity Assessment: Defer to OT evaluation    Lower Extremity Assessment Lower Extremity Assessment: Generalized weakness;Difficult to assess due to impaired cognition    Cervical / Trunk Assessment Cervical / Trunk Assessment: Normal  Communication   Communication: No difficulties  Cognition Arousal/Alertness: Awake/alert Behavior During Therapy: WFL for tasks assessed/performed Overall Cognitive Status: Impaired/Different from baseline Area of Impairment: (Demetia level is advancing )           General Comments      Exercises     Assessment/Plan    PT Assessment Patient needs continued PT services  PT Problem List Decreased strength;Decreased activity tolerance;Decreased balance;Decreased mobility;Decreased cognition       PT Treatment Interventions      PT Goals (Current goals can be found in the Care Plan section)  Acute Rehab PT Goals Patient Stated Goal: (not able to state, spouse would like for pt to walk ) PT Goal Formulation: With family Time For Goal Achievement: 11/05/19 Potential to Achieve Goals: Fair    Frequency Min 2X/week   Barriers to discharge        Co-evaluation   Reason for Co-Treatment: Necessary to address cognition/behavior during functional activity;For patient/therapist safety;To address functional/ADL transfers PT goals  addressed during session: Mobility/safety with mobility;Balance;Proper use of DME OT goals addressed during session: ADL's and self-care       AM-PAC PT "6 Clicks" Mobility  Outcome Measure Help needed turning from your back to your side while in a flat bed without using bedrails?: A Lot Help needed moving from lying on your back to sitting on the side of a flat bed without using bedrails?: Total Help needed moving to and from a bed to a chair (including a wheelchair)?: A Lot Help needed standing up from a chair using your arms (e.g., wheelchair or bedside chair)?: A Lot Help needed to walk in hospital room?: A Lot Help needed climbing 3-5 steps with a railing? : Total 6 Click Score: 10    End of Session Equipment Utilized During Treatment: Gait belt Activity Tolerance: Patient tolerated treatment well Patient left: in chair;with call bell/phone within reach;with chair alarm set;with family/visitor present Nurse Communication: Mobility status PT Visit Diagnosis: Unsteadiness on feet (R26.81);Muscle weakness (generalized) (M62.81);Difficulty in walking, not elsewhere classified (R26.2)    Time: 7846-9629 PT Time Calculation (min) (ACUTE ONLY): 43 min   Charges:   PT Evaluation $PT Eval Moderate Complexity: 1 Mod         Gwynneth Albright PT, DPT Physical Therapist with [facility] [phone]  10/22/2019 8:07 PM

## 2019-10-22 NOTE — Progress Notes (Signed)
PROGRESS NOTE  Charlene Rubio JIR:[id number] DOB: [birth year] DOA: 10/21/2019 PCP: Lauree Chandler, NP  HPI/Recap of past 24 hours: Charlene Rubio is a 74 y.o. female with medical history significant for dementia, bilateral breast cancer status post mastectomy in 2018 on Femara, left lower extremity DVT in 2018 on Eliquis, chronic combined systolic and diastolic CHF, insulin-dependent type 2 diabetes, aortic valve disorder, essential hypertension, who presented to [facility] ED accompanied by her husband with sudden onset bilateral lower extremity weakness.  Noted by her husband this morning around 10 AM after she woke up.  Patient had her second shot of COVID-19 vaccine day prior to presentation.  She was in her usual state of health prior to this.  Patient has advanced dementia and is unable to provide a history.  History is mainly obtained from her husband at bedside, EDP, and medical records.  No recent fevers or other complaints, appetite has been good.  At baseline patient ambulates with a walker.  When she woke up this morning, she could not sustain her own weight on both feet and could not ambulate.  This was a drastic change for her.  Her husband brought her in to the ED for further evaluation.  ED Course: Upon presentation to the ED vital signs stable with negative orthostatic vital signs.  Initial chest x-ray showed right upper lobe mass which was confirmed on CT chest.  Lab studies are essentially unremarkable.  Urinalysis pending at the time of this visit.  TRH was asked to admit for further work-up and management.  10/22/19: Seen and examined at bedside this morning.  She is alert and interactive in a state of advanced dementia.  She has no new complaints.  Discussed her case with [name].  Would like to have a biopsy prior to establishing a plan.  Holding off Eliquis.  Interventional radiology consulted for possible biopsy of right upper lobe mass.    Assessment/Plan: Active Problems:  Mass of right lung  Acute bilateral lower extremity weakness, unclear etiology TSH normal Urine analysis and urine culture pending. CPK pending Negative orthostatic vital signs CT head no acute abnormalities, extensive chronic small vessel ischemic changes with progressive atrophy. Continue fall precautions PT OT to assess  New right upper lobe mass with suspicion for malignancy, incidentally found on chest x-ray and CT scan Personally reviewed imagings, chest x-ray and CT scan, which showed findings as stated below. 4.3 cm solid right upper lobe mass reviewed in the major fissure, minor fissure and lateral pleural margin without chest wall invasion.  There is associated right hilar and paratracheal adenopathy including the lymph node of urinary to carina but no contralateral adenopathy.  Assuming non-small cell lung cancer this likely represents T2b N2 M0 disease (stage IIIA). Discussed her case with [name].  Would like to have a biopsy prior to establishing a plan.  Holding off Eliquis.  Interventional radiology consulted for possible biopsy of right upper lobe mass.   If interventional radiology plans on doing biopsy in the inpatient setting will start heparin drip if not will defer to IR to resume Eliquis. O2 saturation stable 98% on room air.  Insulin-dependent type 2 diabetes, controlled Hemoglobin A1c 6.2 on 10/21/2019. Continue insulin sliding scale  Dementia Husband patient has a tendency to pull out IVs He is okay with using bilateral mittens for patient's own safety Continue home medications  History of bilateral breast cancer status post mastectomy Follows with [name] Continue Femara  History of left  lower extremity DVT Received 1 dose of Eliquis this morning.  Hold off Eliquis due to possible procedure.  If interventional radiology plans on doing biopsy in the inpatient setting will start heparin drip if not will defer to IR to resume Eliquis.  Chronic  combined systolic and diastolic CHF Last 2D echo on record done on 07/26/2019 showed preserved LVEF 60 to 65% with grade 1 diastolic dysfunction Unclear if she had repeated echo done in the outpatient setting with her cardiologist Resume home Entresto.  Coreg dose reduced due to bradycardia.  Continue strict I's and O's and daily weight  Sinus bradycardia Heart rate in the 50s Likely iatrogenic Reduce dose of Coreg Continue to monitor  Essential hypertension Blood pressure stable  Ambulatory dysfunction PT OT to assess. Continue fall precautions  DVT prophylaxis: Eliquis on hold due to possible procedure.  Code Status: DNR as stated by her husband.  She has an advanced directive at home.  Family Communication: Discussed plan with her husband at bedside 3/4.  Disposition Plan:  Patient is from home.  Anticipate discharge to home pending IR evaluation.  Consults called:  Discussed with [name] on 3/5.     Objective: Vitals:   10/21/19 1856 10/21/19 2129 10/22/19 0433 10/22/19 1211  BP:  115/65 (!) 160/56 (!) 145/51  Pulse:  65 61 (!) 58  Resp:  17 15 17   Temp: 98.5 F (36.9 C) 99.1 F (37.3 C) 98.7 F (37.1 C) 98 F (36.7 C)  TempSrc: Oral Oral Oral Oral  SpO2:  97% 99% 98%  Weight:   81.1 kg     Intake/Output Summary (Last 24 hours) at 10/22/2019 1300 Last data filed at 10/22/2019 1000 Gross per 24 hour  Intake 360 ml  Output 225 ml  Net 135 ml   Filed Weights   10/22/19 0433  Weight: 81.1 kg    Exam:  . General: 74 y.o. year-old female well developed well nourished in no acute distress.  Alert and pleasantly demented. . Cardiovascular: Regular rate and rhythm with no rubs or gallops.  No thyromegaly or JVD noted.   Marland Kitchen Respiratory: Clear to auscultation with no wheezes or rales. Good inspiratory effort. . Abdomen: Soft nontender nondistended with normal bowel sounds x4 quadrants. . Musculoskeletal: No lower extremity edema. 2/4 pulses in all 4  extremities. Marland Kitchen Psychiatry: Mood is appropriate for condition and setting   Data Reviewed: CBC: Recent Labs  Lab 10/21/19 1319 10/22/19 0435  WBC 5.1 3.5*  NEUTROABS 4.2  --   HGB 13.5 12.8  HCT 40.2 38.3  MCV 97.1 97.2  PLT 147* 509*   Basic Metabolic Panel: Recent Labs  Lab 10/21/19 1302 10/22/19 0435  NA 139 140  K 4.5 4.0  CL 107 107  CO2 24 24  GLUCOSE 126* 129*  BUN 21 18  CREATININE 0.77 0.67  CALCIUM 8.9 8.8*  MG  --  2.4  PHOS  --  3.9   GFR: Estimated Creatinine Clearance: 66.2 mL/min (by C-G formula based on SCr of 0.67 mg/dL). Liver Function Tests: Recent Labs  Lab 10/21/19 1302  AST 18  ALT 12  ALKPHOS 78  BILITOT 0.9  PROT 6.8  ALBUMIN 3.6   No results for input(s): LIPASE, AMYLASE in the last 168 hours. No results for input(s): AMMONIA in the last 168 hours. Coagulation Profile: No results for input(s): INR, PROTIME in the last 168 hours. Cardiac Enzymes: No results for input(s): CKTOTAL, CKMB, CKMBINDEX, TROPONINI in the last 168 hours. BNP (  last 3 results) No results for input(s): PROBNP in the last 8760 hours. HbA1C: Recent Labs    10/21/19 1319  HGBA1C 6.2*   CBG: Recent Labs  Lab 10/21/19 1921 10/21/19 2303 10/22/19 0743 10/22/19 1208  GLUCAP 156* 179* 140* 150*   Lipid Profile: No results for input(s): CHOL, HDL, LDLCALC, TRIG, CHOLHDL, LDLDIRECT in the last 72 hours. Thyroid Function Tests: Recent Labs    10/21/19 1946  TSH 1.234   Anemia Panel: No results for input(s): VITAMINB12, FOLATE, FERRITIN, TIBC, IRON, RETICCTPCT in the last 72 hours. Urine analysis:    Component Value Date/Time   COLORURINE YELLOW 08/25/2017 Rosalie 08/25/2017 0959   LABSPEC 1.015 08/25/2017 0959   PHURINE 5.0 08/25/2017 0959   GLUCOSEU NEGATIVE 08/25/2017 0959   HGBUR NEGATIVE 08/25/2017 0959   HGBUR negative 02/08/2009 1007   BILIRUBINUR NEGATIVE 08/25/2017 0959   BILIRUBINUR Neg 04/25/2017 1132   KETONESUR  NEGATIVE 08/25/2017 0959   PROTEINUR NEGATIVE 08/25/2017 0959   UROBILINOGEN 2.0 (A) 04/25/2017 1132   UROBILINOGEN 0.2 02/08/2009 1007   NITRITE NEGATIVE 08/25/2017 0959   LEUKOCYTESUR NEGATIVE 08/25/2017 0959   Sepsis Labs: @LABRCNTIP (procalcitonin:4,lacticidven:4)  ) Recent Results (from the past 240 hour(s))  SARS CORONAVIRUS 2 (TAT 6-24 HRS) Nasopharyngeal Nasopharyngeal Swab     Status: None   Collection Time: 10/21/19  3:57 PM   Specimen: Nasopharyngeal Swab  Result Value Ref Range Status   SARS Coronavirus 2 NEGATIVE NEGATIVE Final    Comment: (NOTE) SARS-CoV-2 target nucleic acids are NOT DETECTED. The SARS-CoV-2 RNA is generally detectable in upper and lower respiratory specimens during the acute phase of infection. Negative results do not preclude SARS-CoV-2 infection, do not rule out co-infections with other pathogens, and should not be used as the sole basis for treatment or other patient management decisions. Negative results must be combined with clinical observations, patient history, and epidemiological information. The expected result is Negative. Fact Sheet for Patients: SugarRoll.be Fact Sheet for Healthcare Providers: https://www.woods-mathews.com/ This test is not yet approved or cleared by the [place] FDA and  has been authorized for detection and/or diagnosis of SARS-CoV-2 by FDA under an Emergency Use Authorization (EUA). This EUA will remain  in effect (meaning this test can be used) for the duration of the COVID-19 declaration under Section 56 4(b)(1) of the Act, 21 U.S.C. section 360bbb-3(b)(1), unless the authorization is terminated or revoked sooner. Performed at [facility], [place] [address] [place], [place] [address]       Studies: CT Head Wo Contrast  Result Date: 10/21/2019 CLINICAL DATA:  Altered mental status. Difficulty walking. EXAM: CT HEAD WITHOUT CONTRAST TECHNIQUE: Contiguous  axial images were obtained from the base of the skull through the vertex without intravenous contrast. COMPARISON:  CT scan dated 04/29/2017 FINDINGS: Brain: No evidence of acute infarction, hemorrhage, hydrocephalus, extra-axial collection or mass lesion/mass effect. Diffuse cerebral cortical atrophy with secondary ventricular dilatation. The atrophy is most severe in the temporal lobes. Atrophy has slightly progressed since the prior study. Extensive chronic periventricular white matter lucency with a few old lacunar infarcts in the left basal ganglia. Vascular: No hyperdense vessel or unexpected calcification. Skull: Normal. Negative for fracture or focal lesion. Sinuses/Orbits: Normal. Other: None IMPRESSION: No acute abnormalities. Progressive atrophy. Extensive chronic small vessel ischemic changes. Electronically Signed   By: Lorriane Shire M.D.   On: 10/21/2019 15:11   CT Chest W Contrast  Result Date: 10/21/2019 CLINICAL DATA:  Weakness. Patient received a COVID  vaccine yesterday. Right upper lobe mass on conventional radiographs. EXAM: CT CHEST WITH CONTRAST TECHNIQUE: Multidetector CT imaging of the chest was performed during intravenous contrast administration. CONTRAST:  3mL OMNIPAQUE IOHEXOL 300 MG/ML  SOLN COMPARISON:  Chest radiograph 10/21/2019 CT chest from 07/22/2017 FINDINGS: Cardiovascular: Atherosclerotic calcification of the aortic arch left anterior descending and circumflex coronary arteries. Mediastinum/Nodes: Right hilar lymph node 2.4 cm in short axis on image 55/2, displacing adjacent pulmonary arterial and pulmonary venous structures. Adjacent right lower paratracheal adenopathy including a node extending anterior to the carina measuring 1.6 cm in short axis on image 53/2, and an adjacent paratracheal node measuring 1.9 cm short axis on image 51/2. All 3 of these lymph nodes are tangential to each other without intact fat planes in between. However, I do not observe over  contralateral thoracic adenopathy. Small type 1 hiatal hernia. Lungs/Pleura: A 4.3 by 3.1 by 3.0 cm solid-appearing mass of the right upper lobe abuts the major fissure, minor fissure, and lateral pleural margin, without compelling findings of adjacent rib destruction or chest wall invasion. There is some mild lobularity along the minor fissure margin of the mass for example on image 18/5, but without definite trans fissural extension. 2 by 3 mm mm right lower lobe subpleural nodule is likely a benign subpleural lymph node on image 82/7, but not entirely specific in this setting. Post radiation therapy related findings anteriorly in the left lung. Upper Abdomen: A 2.2 by 1.7 cm right adrenal mass is present on image 131/2, stable from 07/22/2017. A 1.0 by 1.7 cm stable nodules present in the left adrenal gland (image 127/2). Because these nodules were present in 2018 but the lung mass was not, these are likely benign lesions such as adenomas. Musculoskeletal: The mass is adjacent to the right fifth and sixth ribs, but no rib involvement or rib destruction is currently identified. Thoracic kyphosis is present along with levoconvex lower thoracic scoliosis. Thoracic spondylosis noted. Bilateral mastectomies noted. IMPRESSION: 1. 4.3 cm solid right upper lobe mass abutting the major fissure, minor fissure, and lateral pleural margin, without chest wall invasion. There is associated right hilar and paratracheal adenopathy including a lymph node abutting the carina, but no contralateral adenopathy. Assuming non-small cell lung cancer this likely represents T2b N2 M0 disease (stage IIIA). 2. Bilateral adrenal masses are stable from, but the lung mass was not present-this indicates that the adrenal masses are likely benign. 3.  Aortic Atherosclerosis (ICD10-I70.0).  Coronary atherosclerosis. Electronically Signed   By: Van Clines M.D.   On: 10/21/2019 15:38    Scheduled Meds: . [START ON 10/26/2019] apixaban  5  mg Oral BID  . brimonidine  1 drop Both Eyes Daily  . carvedilol  3.125 mg Oral BID  . donepezil  10 mg Oral QHS  . furosemide  80 mg Oral Daily  . insulin aspart  0-5 Units Subcutaneous QHS  . insulin aspart  0-9 Units Subcutaneous TID WC  . letrozole  2.5 mg Oral Daily  . memantine  10 mg Oral BID  . potassium chloride SA  20 mEq Oral Daily  . spironolactone  12.5 mg Oral Daily    Continuous Infusions:   LOS: 1 day     Kayleen Memos, MD [facility] Pager [phone]  If 7PM-7AM, please contact night-coverage [email or web address] Password [email or web address] 10/22/2019, 1:00 PM

## 2019-10-22 NOTE — Progress Notes (Signed)
Patient's husband Charlene. Charlene Rubio has expressed his concern for caring for his wife at home. Charlene Rubio stated he needs a hospital bed, BSC and possibly home health and physical therapy to come assist him in her care.

## 2019-10-22 NOTE — Progress Notes (Signed)
Occupational Therapy Evaluation  Clinical Impression: Patient's Cognitive deficits present as a barrier for retention of new information on fall prevention and safety. Spouse has been patient's primary caregiver and spouse reports patient requiring increase in assist with self-care and functional mobility for last month. Patient resides in a single story home with spouse and mobilizes with hand held assist or walker. Patient requires extra time to process information, but able to follow simple commands with extra time. Patient was able to complete grooming task while sitting EOB and halfway don R LE sock sitting EOB. Patient will benefit from continued skilled acute OT services.    10/22/19 1623  OT Visit Information  Assistance Needed +2  PT/OT/SLP Co-Evaluation/Treatment Yes  Reason for Co-Treatment Necessary to address cognition/behavior during functional activity;For patient/therapist safety;To address functional/ADL transfers  PT goals addressed during session Mobility/safety with mobility;Balance;Proper use of DME  OT goals addressed during session ADL's and self-care  History of Present Illness Charlene Rubio is a 74 y.o. female with medical history significant for dementia, bilateral breast cancer status post mastectomy in 2018 on Femara, left lower extremity DVT in 2018 on Eliquis, chronic combined systolic and diastolic CHF, insulin-dependent type 2 diabetes, aortic valve disorder, essential hypertension, who presented to [facility] ED accompanied by her husband with sudden onset bilateral lower extremity weakness. Acute dx Mass of R lung   Precautions  Precautions Fall  Restrictions  Weight Bearing Restrictions No  Home Living  Family/patient expects to be discharged to: Private residence  Living Arrangements Spouse/significant other  Available Help at Discharge Family  Type of [place] to enter  Entrance Stairs-Number of Steps 4  Entrance Stairs-Rails Right  [place] One level  Bathroom Shower/Tub Walk-in shower;Tub only  Corporate treasurer Yes  How Accessible Accessible via walker  Home Equipment Tub bench;Wheelchair - [employer] - 4 wheels  Additional Comments toilet right across from sink to pull up from  Prior Function  Level of Independence Independent with assistive device(s);Needs assistance  Gait / Transfers Assistance Needed 1-2 weeks she could have started getting herself dressed  ADL's / [place] husband doing all homemaign and helpingher shower for 6 months to year  Comments until 1 month- 1 wk ago she was indepemdet with rollator, quit alking with covid used to walk aroudn lowes or grocery with shopping cart  Communication  Communication No difficulties  Pain Assessment  Pain Assessment  (Not able to cognitively provide an accurate number)  Cognition  Arousal/Alertness Awake/alert  Behavior During Therapy WFL for tasks assessed/performed  Overall Cognitive Status Impaired/Different from baseline  Area of Impairment  (Charlene Rubio level is advancing )  Upper Extremity Assessment  Upper Extremity Assessment Difficult to assess due to impaired cognition  Lower Extremity Assessment  Lower Extremity Assessment Defer to PT evaluation  ADL  Overall ADL's  Needs assistance/impaired  Eating/Feeding Minimal assistance  Grooming Oral care;Wash/dry face;Wash/dry hands;Minimal assistance  Upper Body Bathing Maximal assistance  Lower Body Bathing Total assistance  Upper Body Dressing  Maximal assistance  Lower Body Dressing Total assistance  Toilet Transfer Moderate assistance;+2 for physical assistance;+2 for safety/equipment  Toileting- Clothing Manipulation and Hygiene Total assistance  Tub/ Shower Transfer Moderate assistance;+2 for physical assistance;+2 for safety/equipment  Functional mobility during ADLs +2 for safety/equipment;+2 for physical assistance;Moderate assistance   Vision- History  Baseline Vision/History Wears glasses  Wears Glasses At all times  Bed Mobility  Overal bed mobility Needs Assistance  Bed Mobility  Supine to Sit  Supine to sit Max assist  Transfers  Overall transfer level Needs assistance  Equipment used Rolling walker (2 wheeled)  Transfers Sit to/from [employer] Transfers  Sit to Stand Mod assist;+2 physical assistance;+2 safety/equipment  Stand pivot transfers Mod assist;+2 physical assistance;+2 safety/equipment  Balance  Overall balance assessment Needs assistance  Sitting balance-Leahy Scale Fair  Standing balance-Leahy Scale Poor  OT - End of Session  Equipment Utilized During Treatment Rolling walker  Activity Tolerance Patient limited by fatigue  Patient left in chair;with call bell/phone within reach;with family/visitor present;with chair alarm set  Nurse Communication Mobility status  OT Assessment  OT Recommendation/Assessment Patient needs continued OT Services  OT Visit Diagnosis Muscle weakness (generalized) (M62.81)  OT Problem List Decreased strength;Decreased activity tolerance;Impaired balance (sitting and/or standing);Decreased safety awareness;Decreased knowledge of use of DME or AE  OT Plan  OT Frequency (ACUTE ONLY) Min 2X/week  AM-PAC OT "6 Clicks" Daily Activity Outcome Measure (Version 2)  Help from another person eating meals? 3  Help from another person taking care of personal grooming? 3  Help from another person toileting, which includes using toliet, bedpan, or urinal? 1  Help from another person bathing (including washing, rinsing, drying)? 2  Help from another person to put on and taking off regular upper body clothing? 2  Help from another person to put on and taking off regular lower body clothing? 1  6 Click Score 12  OT Recommendation  Follow Up Recommendations SNF;Supervision/Assistance - 24 hour  Individuals Consulted  Consulted and Agree with Results and Recommendations Patient   Acute Rehab OT Goals  Patient Stated Goal  (not able to state, spouse would like for pt to walk )  OT Goal Formulation With family  Potential to Achieve Goals Fair  OT Time Calculation  OT Start Time (ACUTE ONLY) 1430  OT Stop Time (ACUTE ONLY) 1517  OT Time Calculation (min) 47 min  OT General Charges  $OT Visit 1 Visit  OT Evaluation  $OT Eval Moderate Complexity 1 Mod  OT Treatments  $Self Care/Home Management  8-22 mins  $Therapeutic Activity 8-22 mins  Written Expression  Dominant Hand Right   Cadarius Nevares OTR/L

## 2019-10-23 ENCOUNTER — Inpatient Hospital Stay (HOSPITAL_COMMUNITY): Payer: Medicare Other

## 2019-10-23 LAB — GLUCOSE, CAPILLARY
Glucose-Capillary: 153 mg/dL — ABNORMAL HIGH (ref 70–99)
Glucose-Capillary: 184 mg/dL — ABNORMAL HIGH (ref 70–99)
Glucose-Capillary: 204 mg/dL — ABNORMAL HIGH (ref 70–99)
Glucose-Capillary: 146 mg/dL — ABNORMAL HIGH (ref 70–99)

## 2019-10-23 LAB — CBC WITH DIFFERENTIAL/PLATELET
Abs Immature Granulocytes: 0.01 10*3/uL (ref 0.00–0.07)
Basophils Absolute: 0 10*3/uL (ref 0.0–0.1)
Basophils Relative: 1 %
Eosinophils Absolute: 0.2 10*3/uL (ref 0.0–0.5)
Eosinophils Relative: 5 %
HCT: 41.5 % (ref 36.0–46.0)
Hemoglobin: 13.8 g/dL (ref 12.0–15.0)
Immature Granulocytes: 0 %
Lymphocytes Relative: 12 %
Lymphs Abs: 0.5 10*3/uL — ABNORMAL LOW (ref 0.7–4.0)
MCH: 32.5 pg (ref 26.0–34.0)
MCHC: 33.3 g/dL (ref 30.0–36.0)
MCV: 97.6 fL (ref 80.0–100.0)
Monocytes Absolute: 0.6 10*3/uL (ref 0.1–1.0)
Monocytes Relative: 14 %
Neutro Abs: 2.8 10*3/uL (ref 1.7–7.7)
Neutrophils Relative %: 68 %
Platelets: 168 10*3/uL (ref 150–400)
RBC: 4.25 MIL/uL (ref 3.87–5.11)
RDW: 13 % (ref 11.5–15.5)
WBC: 4 10*3/uL (ref 4.0–10.5)
nRBC: 0 % (ref 0.0–0.2)

## 2019-10-23 LAB — BASIC METABOLIC PANEL
Anion gap: 9 (ref 5–15)
BUN: 20 mg/dL (ref 8–23)
CO2: 24 mmol/L (ref 22–32)
Calcium: 8.8 mg/dL — ABNORMAL LOW (ref 8.9–10.3)
Chloride: 106 mmol/L (ref 98–111)
Creatinine, Ser: 0.76 mg/dL (ref 0.44–1.00)
GFR calc Af Amer: >60 mL/min (ref >60)
GFR calc non Af Amer: >60 mL/min (ref >60)
Glucose, Bld: 171 mg/dL — ABNORMAL HIGH (ref 70–99)
Potassium: 4 mmol/L (ref 3.5–5.1)
Sodium: 139 mmol/L (ref 135–145)

## 2019-10-23 MED ORDER — LORAZEPAM 2 MG/ML IJ SOLN: 1.0000 mg | Freq: Once | INTRAMUSCULAR | Status: DC

## 2019-10-23 MED ORDER — LORAZEPAM 0.5 MG PO TABS
0.5000 mg | ORAL_TABLET | Freq: Once | ORAL | Status: AC
  Administered 2019-10-23: 0.5 mg via ORAL
  Filled 2019-10-23: qty 1

## 2019-10-23 NOTE — Plan of Care (Signed)
  Problem: Clinical Measurements: Goal: Cardiovascular complication will be avoided Outcome: Progressing   Problem: Activity: Goal: Risk for activity intolerance will decrease Outcome: Progressing   Problem: Nutrition: Goal: Adequate nutrition will be maintained Outcome: Progressing   Problem: Clinical Measurements: Goal: Will remain free from infection Outcome: Progressing   Problem: Coping: Goal: Level of anxiety will decrease Outcome: Progressing   Problem: Elimination: Goal: Will not experience complications related to bowel motility Outcome: Progressing

## 2019-10-23 NOTE — Progress Notes (Signed)
PROGRESS NOTE  Charlene Rubio:[id number] DOB: Feb 07, 1946 DOA: 10/21/2019 PCP: Lauree Chandler, NP  HPI/Recap of past 24 hours: Charlene Rubio is a 74 y.o. female with medical history significant for dementia, bilateral breast cancer status post mastectomy in 2018 on Femara, left lower extremity DVT in 2018 on Eliquis, chronic combined systolic and diastolic CHF, insulin-dependent type 2 diabetes, aortic valve disorder, essential hypertension, who presented to [facility] ED with sudden onset bilateral lower extremity weakness.  Had her second shot of COVID-19 vaccine day prior to presentation.  She was in her usual state of health prior to this.   ED Course: Initial chest x-ray showed right upper lobe mass which was confirmed on CT chest.  TRH was asked to admit for further work-up and management.  Discussed with [name], oncology and Dr. Earleen Newport, IR.  biopsy will be obtained in the outpatient setting.  10/23/19: Seen and examined at bedside.  She is pleasantly confused.  Her U/A came back positive for pyuria and Ucx positive for gram negative rods.  On Keflex TID due to no IV access and difficult stick.  PT OT recs SNF.  Husband prefers discharge to home with HHS PT OT.  TOC consulted to assist with equipments delivery at home.  Assessment/Plan: Active Problems:   Mass of right lung  Acute bilateral lower extremity weakness, unclear etiology TSH normal. CPK normal Negative orthostatic vital signs Her U/A came back positive for pyuria and Ucx positive for gram negative rods.  On Keflex TID due to no IV access and difficult stick.  CT head no acute abnormalities, extensive chronic small vessel ischemic changes with progressive atrophy. MRI with and wo contrast pending. PT OT recs SNF.  Husband prefers discharge to home with HHS PT OT.  TOC consulted to assist with equipments delivery at home. Continue fall precautions  Gram-negative rods UTI Awaiting results of ID and sensitivities Continue  Keflex for now  New right upper lobe mass with suspicion for malignancy, incidentally found on chest x-ray and CT scan 4.3 cm solid right upper lobe mass reviewed in the major fissure, minor fissure and lateral pleural margin without chest wall invasion.  There is associated right hilar and paratracheal adenopathy including the lymph node of urinary to carina but no contralateral adenopathy.  Assuming non-small cell lung cancer this likely represents T2b N2 M0 disease (stage IIIA). Discussed with [name], oncology and Dr. Earleen Newport, IR.  biopsy will be obtained in the outpatient setting.  Insulin-dependent type 2 diabetes with hyperglycemia Hemoglobin A1c 6.2 on 10/21/2019. Continue insulin sliding scale  Dementia Patient has a tendency to pull out IVs, continue mittens bilaterally. Continue home medications  History of bilateral breast cancer status post mastectomy Follows with [name] Continue Femara  History of left lower extremity DVT Continue Eliquis.  No SCDs.  Chronic combined systolic and diastolic CHF Last 2D echo on record done on 07/26/2019 showed preserved LVEF 60 to 65% with grade 1 diastolic dysfunction Unclear if she had repeated echo done in the outpatient setting with her cardiologist Continue home Entresto and p.o. Lasix.  Coreg dose reduced due to bradycardia.  Net I&O -2.2 L Continue strict I's and O's and daily weight  Sinus bradycardia Improving with reduced dose of Coreg. Continue to monitor.  Essential hypertension Continue home medications.  Ambulatory dysfunction PT OT recs SNF.  Husband prefers discharge to home with HHS PT OT.  TOC consulted to assist with equipments delivery at home. Continue fall precautions  DVT prophylaxis:  Eliquis.  Code Status: DNR as stated by her husband.  She has an advanced directive at home.  Family Communication: Discussed plan with her husband 3/5.  Disposition Plan:  Patient is from home.  Anticipate  discharge to home pending DME equipments delivery at home.  Consults called:  Discussed with [name] and Dr. Earleen Newport on 3/5.     Objective: Vitals:   10/22/19 1211 10/22/19 2131 10/23/19 0500 10/23/19 0609  BP: (!) 145/51 (!) 150/60  (!) 167/66  Pulse: (!) 58 62  61  Resp: 17 (!) 21  16  Temp: 98 F (36.7 C) 98.8 F (37.1 C)  97.9 F (36.6 C)  TempSrc: Oral Oral  Oral  SpO2: 98% 98%  98%  Weight:   83.2 kg     Intake/Output Summary (Last 24 hours) at 10/23/2019 1101 Last data filed at 10/23/2019 0858 Gross per 24 hour  Intake 600 ml  Output 2950 ml  Net -2350 ml   Filed Weights   10/22/19 0433 10/23/19 0500  Weight: 81.1 kg 83.2 kg    Exam:  . General: 74 y.o. year-old female pleasantly demented.  Well-developed well-nourished no acute distress.   . Cardiovascular: Regular rate and rhythm no rubs or gallops. Marland Kitchen Respiratory: Clear to auscultation no wheezes or rales.   . Abdomen: Soft nontender normal bowel sounds present.   . Musculoskeletal: No lower extremity edema bilaterally.  She is able to move all 4 extremities.  Does not follow commands too well due to cognitive deficit.  Weakness noted in lower extremities bilaterally. Marland Kitchen Psychiatry: Mood is appropriate for condition and setting.   Data Reviewed: CBC: Recent Labs  Lab 10/21/19 1319 10/22/19 0435 10/23/19 0743  WBC 5.1 3.5* 4.0  NEUTROABS 4.2  --  2.8  HGB 13.5 12.8 13.8  HCT 40.2 38.3 41.5  MCV 97.1 97.2 97.6  PLT 147* 139* 287   Basic Metabolic Panel: Recent Labs  Lab 10/21/19 1302 10/22/19 0435 10/23/19 0743  NA 139 140 139  K 4.5 4.0 4.0  CL 107 107 106  CO2 24 24 24   GLUCOSE 126* 129* 171*  BUN 21 18 20   CREATININE 0.77 0.67 0.76  CALCIUM 8.9 8.8* 8.8*  MG  --  2.4  --   PHOS  --  3.9  --    GFR: Estimated Creatinine Clearance: 67.1 mL/min (by C-G formula based on SCr of 0.76 mg/dL). Liver Function Tests: Recent Labs  Lab 10/21/19 1302  AST 18  ALT 12  ALKPHOS 78  BILITOT  0.9  PROT 6.8  ALBUMIN 3.6   No results for input(s): LIPASE, AMYLASE in the last 168 hours. No results for input(s): AMMONIA in the last 168 hours. Coagulation Profile: No results for input(s): INR, PROTIME in the last 168 hours. Cardiac Enzymes: Recent Labs  Lab 10/22/19 0435  CKTOTAL 47   BNP (last 3 results) No results for input(s): PROBNP in the last 8760 hours. HbA1C: Recent Labs    10/21/19 1319  HGBA1C 6.2*   CBG: Recent Labs  Lab 10/22/19 0743 10/22/19 1208 10/22/19 1654 10/22/19 2159 10/23/19 0742  GLUCAP 140* 150* 201* 126* 153*   Lipid Profile: No results for input(s): CHOL, HDL, LDLCALC, TRIG, CHOLHDL, LDLDIRECT in the last 72 hours. Thyroid Function Tests: Recent Labs    10/21/19 1946  TSH 1.234   Anemia Panel: No results for input(s): VITAMINB12, FOLATE, FERRITIN, TIBC, IRON, RETICCTPCT in the last 72 hours. Urine analysis:    Component  Value Date/Time   COLORURINE YELLOW 10/22/2019 1250   APPEARANCEUR HAZY (A) 10/22/2019 1250   LABSPEC 1.014 10/22/2019 1250   PHURINE 6.0 10/22/2019 1250   GLUCOSEU 50 (A) 10/22/2019 1250   HGBUR SMALL (A) 10/22/2019 1250   HGBUR negative 02/08/2009 1007   BILIRUBINUR NEGATIVE 10/22/2019 1250   BILIRUBINUR Neg 04/25/2017 1132   KETONESUR NEGATIVE 10/22/2019 1250   PROTEINUR NEGATIVE 10/22/2019 1250   UROBILINOGEN 2.0 (A) 04/25/2017 1132   UROBILINOGEN 0.2 02/08/2009 1007   NITRITE NEGATIVE 10/22/2019 1250   LEUKOCYTESUR LARGE (A) 10/22/2019 1250   Sepsis Labs: @LABRCNTIP (procalcitonin:4,lacticidven:4)  ) Recent Results (from the past 240 hour(s))  SARS CORONAVIRUS 2 (TAT 6-24 HRS) Nasopharyngeal Nasopharyngeal Swab     Status: None   Collection Time: 10/21/19  3:57 PM   Specimen: Nasopharyngeal Swab  Result Value Ref Range Status   SARS Coronavirus 2 NEGATIVE NEGATIVE Final    Comment: (NOTE) SARS-CoV-2 target nucleic acids are NOT DETECTED. The SARS-CoV-2 RNA is generally detectable in upper and  lower respiratory specimens during the acute phase of infection. Negative results do not preclude SARS-CoV-2 infection, do not rule out co-infections with other pathogens, and should not be used as the sole basis for treatment or other patient management decisions. Negative results must be combined with clinical observations, patient history, and epidemiological information. The expected result is Negative. Fact Sheet for Patients: SugarRoll.be Fact Sheet for Healthcare Providers: https://www.woods-mathews.com/ This test is not yet approved or cleared by the [place] FDA and  has been authorized for detection and/or diagnosis of SARS-CoV-2 by FDA under an Emergency Use Authorization (EUA). This EUA will remain  in effect (meaning this test can be used) for the duration of the COVID-19 declaration under Section 56 4(b)(1) of the Act, 21 U.S.C. section 360bbb-3(b)(1), unless the authorization is terminated or revoked sooner. Performed at [facility], [place] [address], [place], [place] [address]   Urine culture     Status: Abnormal (Preliminary result)   Collection Time: 10/22/19 12:50 PM   Specimen: Urine, Random  Result Value Ref Range Status   Specimen Description   Final    URINE, RANDOM Performed at [place] [address] [place], [place] [address]    Special Requests   Final    NONE Performed at [facility], [place] [address], [place], [place] [address]    Culture >=100,000 COLONIES/mL GRAM NEGATIVE RODS (A)  Final   Report Status PENDING  Incomplete      Studies: No results found.  Scheduled Meds: . apixaban  5 mg Oral BID  . brimonidine  1 drop Both Eyes Daily  . carvedilol  3.125 mg Oral BID  . cephALEXin  500 mg Oral Q8H  . donepezil  10 mg Oral QHS  . furosemide  80 mg Oral Daily  . insulin aspart  0-5 Units Subcutaneous QHS  . insulin aspart  0-9 Units Subcutaneous  TID WC  . letrozole  2.5 mg Oral Daily  . LORazepam  0.5 mg Oral Once  . memantine  10 mg Oral BID  . potassium chloride SA  20 mEq Oral Daily  . sacubitril-valsartan  1 tablet Oral BID  . spironolactone  12.5 mg Oral Daily    Continuous Infusions:   LOS: 2 days     Kayleen Memos, MD [facility] Pager [phone]  If 7PM-7AM, please contact night-coverage [email or web address] Password [email or web address] 10/23/2019, 11:01 AM

## 2019-10-23 NOTE — Progress Notes (Signed)
Md notified of need to change iv ativan for mri to po due to no iv access. Pt is very limited for iv access points due to bilateral mastectomy in the past, with dual limb restrictions.

## 2019-10-23 NOTE — Progress Notes (Signed)
Updated the patient's spouse Mr. Kiyona Mcnall via phone.  He is aware plan is to continue treatment in hospital and likely discharge to home tomorrow.

## 2019-10-23 NOTE — Clinical Social Work Note (Signed)
Patient suffers from CHF and has trouble breathing at night when head is elevated less than 30 degrees. Bed wedges do not provide enough elevation to resolve breathing issues. Difficulty breathing and pain will cause patient to require frequent changes in body  position which cannot be achieved with a normal bed.  Charlene Rubio. Celestine Bougie, MSW, LCSW [phone]  10/23/2019 11:19 AM

## 2019-10-23 NOTE — Plan of Care (Signed)

## 2019-10-23 NOTE — TOC Initial Note (Signed)
Transition of Care [facility]) - Initial/Assessment Note    Patient Details  Name: Charlene Rubio MRN: [id number] Date of Birth: 1946/03/10  [facility] CM/SW Contact:    Ross Ludwig, LCSW Phone Number: 10/23/2019, 5:01 PM  Clinical Narrative:                  Patient is a 74 year old female who is alert and oriented x1.  Patient was seen by PT and they recommended SNF, however patient and the husband are choosing to go home with home health instead. Patient's husband was explained the difference between SNF and home health, patient's husband stated he feel that he can handle her at home.  Patient and husband requested a bedside commode and a hospital bed.  CSW spoke to [place], [place] at [employer], and they can deliver patient's equipment tonight.  CSW notified patient's husband that equipment will be delivered today, so it will be available for once she discharges.  CSW asked patient's husband if he will need EMS to take her home, he said not he would try to transport her himself, and he does have a walker and wheelchair available for use for her when she returns back home.  CSW contacted [facility], and Nanine Means, [facility], never return call to [place] said they can accept patient however start of care will not be till Tuesday or Wednesday.  CSW to facilitate discharge planning for patient.                                                                                                                                                                                                                                                          Expected Discharge Plan: [place] Barriers to Discharge: Continued Medical Work up   Patient Goals and CMS Choice Patient states their goals for this hospitalization and ongoing recovery are:: To return back home with home health services. CMS Medicare.gov Compare Post Acute Care list provided to:: Patient Represenative  (must comment) Choice offered to / list presented to : Spouse  Expected Discharge Plan and Services Expected Discharge Plan: [place] In-house Referral: Clinical Social Work   Post Acute Care Choice: [place] arrangements for the past 2 months: [place]  DME Arranged: Hospital bed, Bedside commode DME Agency: [employer] Date DME Agency Contacted: 10/23/19 Time DME Agency Contacted: [phone] Representative spoke with at DME Agency: Thedore Mins and [place] Arranged: RN, PT, OT, Nurse's Aide [place] Agency: [place] Date [place]: 10/23/19 Time [place]: 1400 Representative spoke with at [place]: [place] Arrangements/Services Living arrangements for the past 2 months: [place] with:: Spouse Patient language and need for interpreter reviewed:: Yes Do you feel safe going back to the place where you live?: Yes      Need for Family Participation in Patient Care: Yes (Comment) Care giver support system in place?: Yes (comment)   Criminal Activity/Legal Involvement Pertinent to Current Situation/Hospitalization: No - Comment as needed  Activities of Daily Living   ADL Screening (condition at time of admission) Patient's cognitive ability adequate to safely complete daily activities?: No Is the patient deaf or have difficulty hearing?: Yes Does the patient have difficulty seeing, even when wearing glasses/contacts?: No Does the patient have difficulty concentrating, remembering, or making decisions?: Yes Patient able to express need for assistance with ADLs?: No Does the patient have difficulty dressing or bathing?: Yes Independently performs ADLs?: No Feeding: Independent Does the patient have difficulty walking or climbing stairs?: Yes Weakness of Legs: Both Weakness of Arms/Hands: None  Permission Sought/Granted Permission sought to share information with : Family  Supports Permission granted to share information with : Yes, Verbal Permission Granted  Share Information with NAME: [place]rin[place], [place] [phone]  [phone]  Permission granted to share info w AGENCY: Home health agency        Emotional Assessment Appearance:: Appears stated age   Affect (typically observed): Accepting, Calm, Appropriate Orientation: : Oriented to Self Alcohol / Substance Use: Not Applicable Psych Involvement: No (comment)  Admission diagnosis:  Mass of right lung [R91.8] Patient Active Problem List   Diagnosis Date Noted  . Mass of right lung 10/21/2019  . Chronic combined systolic and diastolic congestive heart failure ([place]) 03/20/2018  . Genetic testing 12/12/2017  . Port-A-Cath in place 11/25/2017  . Bilateral breast cancer ([place]) 08/27/2017  . 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 Malignant neoplasm of upper-inner quadrant of left breast in female, estrogen receptor positive ([place]) 07/14/2017  . 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 Memory loss or impairment 05/21/2017  . Chronic deep vein thrombosis (DVT) of left lower extremity ([place]) 05/04/2017  . Thrombocytopenia ([place]) 05/04/2017  . Failure to thrive in adult 05/04/2017  . Breast mass, left 05/04/2017  . Dementia ([place]) 04/30/2017  . DOE (dyspnea on exertion) 04/25/2017  . Bilateral leg edema 04/25/2017  . Acquired leg deformity 04/25/2017  . Thyroid disease 04/25/2017  . Noncompliance 04/25/2017  . Anxiety about health 04/25/2017  . CHF (congestive heart failure) ([place]) 04/25/2017  . Acute respiratory failure with hypoxia and hypercapnia ([place]) 04/25/2017  . Acute diastolic (congestive) heart failure ([place]) 04/25/2017  . Diabetes mellitus ([place]) 04/25/2017  . Hearing loss secondary to cerumen impaction 12/23/2012  . KNEE PAIN, LEFT 04/06/2010  . Obesity, unspecified 07/01/2007  . Aortic valve  disorder 07/01/2007  . ALLERGIC RHINITIS 07/01/2007  . Essential hypertension 07/01/2007   PCP:  Lauree Chandler, NP Pharmacy:   [id number] [place], [place] [address] Phone: [phone] Fax: [place], [place] [employer]  [place] [address] [place] [address] Phone: [phone] Fax: [phone]     Social Determinants of Health (SDOH) Interventions    Readmission Risk Interventions No flowsheet data found.

## 2019-10-23 NOTE — Progress Notes (Signed)
Pt remains confused overall and pulls at tubes. No s/s of distress at this time. Nursing staff can easily reorient pt. Rn will continue to monitor.

## 2019-10-24 LAB — URINE CULTURE: Culture: >=100000 — AB

## 2019-10-24 MED ORDER — CARVEDILOL 6.25 MG PO TABS: 6.2500 mg | ORAL_TABLET | Freq: Two times a day (BID) | ORAL | 0 refills | Status: AC

## 2019-10-24 MED ORDER — SACCHAROMYCES BOULARDII 250 MG PO CAPS: 250.0000 mg | ORAL_CAPSULE | Freq: Two times a day (BID) | ORAL | Status: DC

## 2019-10-24 MED ORDER — CARVEDILOL 6.25 MG PO TABS
6.2500 mg | ORAL_TABLET | Freq: Two times a day (BID) | ORAL | Status: DC
  Administered 2019-10-24: 6.25 mg via ORAL
  Filled 2019-10-24: qty 1

## 2019-10-24 MED ORDER — CEPHALEXIN 500 MG PO CAPS: 500.0000 mg | ORAL_CAPSULE | Freq: Three times a day (TID) | ORAL | 0 refills | Status: AC

## 2019-10-24 MED ORDER — SACCHAROMYCES BOULARDII 250 MG PO CAPS: 250.0000 mg | ORAL_CAPSULE | Freq: Two times a day (BID) | ORAL | 0 refills | Status: DC

## 2019-10-24 NOTE — Discharge Summary (Signed)
Discharge Summary  Charlene Rubio VWU:[id number] DOB: 1946-06-22  PCP: Lauree Chandler, NP  Admit date: 10/21/2019 Discharge date: 10/24/2019  Time spent: 35 minutes   Recommendations for Outpatient Follow-up:  1. Follow up with your oncologist in 1 week. 2. Follow up with IR. 3. Follow up with your PCP. 4. Take your medications as prescribed. 5. Continue PT OT with assistance and fall precautions.  Discharge Diagnoses:  Active Hospital Problems   Diagnosis Date Noted  . Mass of right lung 10/21/2019    Resolved Hospital Problems  No resolved problems to display.    Discharge Condition: Stable.  Diet recommendation: Resume previous diet.  Vitals:   10/24/19 0608 10/24/19 1141  BP: (!) 167/86 139/64  Pulse: 74 61  Resp: 20 20  Temp: 98.3 F (36.8 C) 97.8 F (36.6 C)  SpO2: 98% 100%    History of present illness:  Charlene Rubio a 74 y.o.femalewith medical history significant fordementia, bilateral breast cancer status post mastectomy in 2018on Femara, left lower extremity DVT in 2018 on Eliquis, chronic combined systolic and diastolic CHF, insulin-dependent type 2 diabetes, aortic valve disorder, essential hypertension, who presented to [facility] from home where she lives with her husband with sudden onset bilateral lower extremity weakness.  Had her second shot of COVID-19 vaccine day prior to presentation. She was in her usual state of health prior to this.   ED Course:Initial chest x-ray showed right upper lobe mass which was confirmed on CT chest.  TRH was asked to admit for further work-up and management.  Discussed with [name], oncology and Dr. Earleen Newport, IR.  biopsy will be obtained in the outpatient setting.  PT OT assessed and recommended SNF.  Husband prefers discharge to home with DME's and home health services.  TOC consulted to assist with home health services and delivery of equipments at home.    10/24/19: Seen and examined.  No acute events  overnight.  She is alert and pleasantly confused in the setting of dementia.  She has no new complaints.   Urine culture ID and sensitivities returned today 10/24/2019, showed Klebsiella pneumonia greater than 100,000 colonies.  With resistance to ampicillin, and sensitivity to ceftriaxone and cefazolin, switched to Keflex on 10/22/19.  Hospital Course:  Active Problems:   Mass of right lung  Acute bilateral lower extremity weakness, suspect related to Klebsiella pneumonia UTI. TSH and CPK normal. Negative orthostatic vital signs CT head no acute abnormalities, extensive chronic small vessel ischemic changes with progressive atrophy. MRI brain no acute intracranial abnormalities.  Showing chronic lacunar infarct within the right cerebellum appear more numerous as compared to prior MRI in 2018. PT OT recs SNF.  Husband Mr. Hope Brandenburger prefers discharge to home with HHS PT OT.  TOC consulted to assist with home health services and delivery of equipments at home.  Continue PT OT with assistance and fall precautions.  Klebsiella pneumonia UTI Urine culture ID and sensitivities returned today 10/24/2019, showed Klebsiella pneumonia greater than 100,000 colonies.  With resistance to ampicillin, and sensitivity to ceftriaxone and cefazolin, switched to Keflex on 10/22/19 continue x7 days.  Added Florastor 250 mg twice daily  New right upper lobe mass with suspicion for malignancy,incidentally found on chest x-ray and CT scan 4.3 cm solid right upper lobe mass reviewed in the major fissure, minor fissure and lateral pleural margin without chest wall invasion. There is associated right hilar and paratracheal adenopathy including the lymph node of urinary to carina but no contralateral adenopathy.  Assuming non-small cell lung cancer this likely represents T2b N2 M0 disease (stage IIIA). Discussed with [name], oncology and Dr. Earleen Newport, IR.  biopsy will be obtained in the outpatient setting. Please keep  follow-up appointments with oncology and IR.  Insulin-dependent type 2 diabetes with hyperglycemia Hemoglobin A1c 6.2 on 10/21/2019. Continue home regimen Follow-up with your PCP  Dementia Continue home medications Continue fall precautions  History of bilateral breast cancer status post mastectomy ContinueFemara Follow-up with [name]  History of left lower extremity DVT Continue Eliquis.   Chronic combined systolic and diastolic CHF Last 2D echo on record done on 07/26/2019 showed preserved LVEF 60 to 65% with grade 1 diastolic dysfunction Unclear if she had repeated echo done in the outpatient setting with her cardiologist Continue home Entresto and p.o. Lasix.  Coreg dose reduced to 6.25 mg bid due to bradycardia. Net I&O -3.2 L Follow-up with your cardiologist  Resolved sinus bradycardia, likely iatrogenic on beta-blocker. Improving with reduced dose of Coreg. Follow-up with your cardiologist  Essential hypertension Blood pressure is at goal 139/64, heart rate of 61, respiratory rate of 20, O2 saturation 100% on room air on day of discharge. This is on Coreg 6.25 mg twice daily, home Lasix, Entresto, and spironolactone. Follow-up with your PCP or cardiology  Ambulatory dysfunction PT OT recs SNF.  Husband prefers discharge to home as stated above. Continue PT OT with assistance and fall precautions.  DVT prophylaxis: Eliquis.  Code Status:DNR as stated by her husband. She has an advanced directive at home.   Consults called: Discussed with [name] and Dr. Earleen Newport on 3/5.    Procedures:  None.  Discharge Exam: BP 139/64 (BP Location: Left Leg)   Pulse 61   Temp 97.8 F (36.6 C) (Axillary)   Resp 20   Wt 83.2 kg   LMP  (LMP Unknown)   SpO2 100%   BMI 29.61 kg/m  . General: 74 y.o. year-old female well developed well nourished in no acute distress.  Alert and pleasantly demented. . Cardiovascular: Regular rate and rhythm with no  rubs or gallops.  No thyromegaly or JVD noted.   Marland Kitchen Respiratory: Clear to auscultation with no wheezes or rales. Good inspiratory effort. . Abdomen: Soft nontender nondistended with normal bowel sounds x4 quadrants. . Musculoskeletal: No lower extremity edema bilaterally. Marland Kitchen Psychiatry: Mood is appropriate for condition and setting  Discharge Instructions You were cared for by a hospitalist during your hospital stay. If you have any questions about your discharge medications or the care you received while you were in the hospital after you are discharged, you can call the unit and asked to speak with the hospitalist on call if the hospitalist that took care of you is not available. Once you are discharged, your primary care physician will handle any further medical issues. Please note that NO REFILLS for any discharge medications will be authorized once you are discharged, as it is imperative that you return to your primary care physician (or establish a relationship with a primary care physician if you do not have one) for your aftercare needs so that they can reassess your need for medications and monitor your lab values.   Allergies as of 10/24/2019      Reactions   Chlorhexidine    Criteria no longer met      Medication List    TAKE these medications   Alphagan P 0.1 % Soln Generic drug: brimonidine Place 1 drop into both eyes daily.  B-D UF III MINI PEN NEEDLES 31G X 5 MM Misc Generic drug: Insulin Pen Needle 1 EACH BY DOES NOT APPLY ROUTE 2 (TWO) TIMES DAILY. USE TWICE DAILY WITH INSULIN PEN. DX: E11.9   carvedilol 6.25 MG tablet Commonly known as: COREG Take 1 tablet (6.25 mg total) by mouth 2 (two) times daily. What changed:   medication strength  how much to take  when to take this   cephALEXin 500 MG capsule Commonly known as: KEFLEX Take 1 capsule (500 mg total) by mouth every 8 (eight) hours for 7 days.   Dexcom G6 Receiver Devi 1 Device by Does not apply route as  directed.   Dexcom G6 Sensor Misc 1 Device by Does not apply route as directed.   Dexcom G6 Transmitter Misc 1 Device by Does not apply route as directed.   donepezil 10 MG tablet Commonly known as: ARICEPT TAKE 1 TABLET BY MOUTH AT  BEDTIME   Eliquis 5 MG Tabs tablet Generic drug: apixaban TAKE 1 TABLET BY MOUTH TWICE A DAY What changed: how much to take   Entresto 24-26 MG Generic drug: sacubitril-valsartan TAKE 1 TABLET BY MOUTH TWICE A DAY What changed: additional instructions   furosemide 80 MG tablet Commonly known as: LASIX TAKE 1 TABLET BY MOUTH  DAILY   Insulin Lispro Prot & Lispro (75-25) 100 UNIT/ML Kwikpen Commonly known as: HumaLOG Mix 75/25 KwikPen Inject 12 Units into the skin 2 (two) times daily with a meal.   Klor-Con M20 20 MEQ tablet Generic drug: potassium chloride SA TAKE 1 TABLET BY MOUTH EVERY DAY What changed: how much to take   letrozole 2.5 MG tablet Commonly known as: FEMARA Take 1 tablet (2.5 mg total) by mouth daily.   memantine 10 MG tablet Commonly known as: NAMENDA Take 1 tablet (10 mg total) by mouth 2 (two) times daily.   ONE TOUCH ULTRA 2 w/Device Kit Use to test blood sugar 2 times  daily DX: S92.3   OneTouch Delica Lancets 30Q Misc Use as instructed to test blood sugar 2 times daily DX: E11.9   OneTouch Verio test strip Generic drug: glucose blood One Touch Verio. Use to test blood sugar twice daily Dx: E11.9   saccharomyces boulardii 250 MG capsule Commonly known as: FLORASTOR Take 1 capsule (250 mg total) by mouth 2 (two) times daily for 7 days.   spironolactone 25 MG tablet Commonly known as: ALDACTONE TAKE 1/2 TABLET BY MOUTH EVERY DAY            Durable Medical Equipment  (From admission, onward)         Start     Ordered   10/22/19 1607  For home use only DME 3 n 1  Once     10/22/19 1606   10/22/19 1606  For home use only DME Hospital bed  Once    Question Answer Comment  Length of Need 6 Months     Patient has (list medical condition): Ambulatory dysfunction   The above medical condition requires: Patient requires the ability to reposition frequently   Head must be elevated greater than: 30 degrees   Bed type Semi-electric      10/22/19 1606         Allergies  Allergen Reactions  . Chlorhexidine     Criteria no longer met   Follow-up Information    Lauree Chandler, NP. Call in 1 day(s).   Specialty: Geriatric Medicine Why: Please call for a post hospital follow-up  appointment. Contact information: [place]. [place] [address] [phone]        Larey Dresser, MD .   Specialty: Cardiology Contact information: [place] [address] [phone]        Nicholas Lose, MD. Call in 1 day(s).   Specialty: Hematology and Oncology Why: Please call for a post hospital follow-up appointment. Contact information: [place] [address] [place], [place], DO. Call in 1 day(s).   Specialties: Interventional Radiology, Radiology Why: Please call for a post hospital follow-up appointment. Contact information: [place] [address] [place] [place] [address] [phone]            The results of significant diagnostics from this hospitalization (including imaging, microbiology, ancillary and laboratory) are listed below for reference.    Significant Diagnostic Studies: DG Chest 2 View  Result Date: 10/21/2019 CLINICAL DATA:  Altered mental status EXAM: CHEST - 2 VIEW COMPARISON:  10/09/2017 FINDINGS: Interval removal of a right-sided Port-A-Cath. Stable mild cardiomegaly. Aortic calcified and tortuous. Masslike 3.3 x 3.2 cm opacity abutting the pleural surface of the inferolateral aspect of the right upper lobe, new from prior. Left lung is clear. No pleural effusion or pneumothorax. IMPRESSION: 3.3 cm masslike opacity abutting the pleural surface of the inferolateral aspect of the  right upper lobe, new from prior. Contrast-enhanced CT of the chest recommended for further evaluation. Electronically Signed   By: Davina Poke D.O.   On: 10/21/2019 13:07   CT Head Wo Contrast  Result Date: 10/21/2019 CLINICAL DATA:  Altered mental status. Difficulty walking. EXAM: CT HEAD WITHOUT CONTRAST TECHNIQUE: Contiguous axial images were obtained from the base of the skull through the vertex without intravenous contrast. COMPARISON:  CT scan dated 04/29/2017 FINDINGS: Brain: No evidence of acute infarction, hemorrhage, hydrocephalus, extra-axial collection or mass lesion/mass effect. Diffuse cerebral cortical atrophy with secondary ventricular dilatation. The atrophy is most severe in the temporal lobes. Atrophy has slightly progressed since the prior study. Extensive chronic periventricular white matter lucency with a few old lacunar infarcts in the left basal ganglia. Vascular: No hyperdense vessel or unexpected calcification. Skull: Normal. Negative for fracture or focal lesion. Sinuses/Orbits: Normal. Other: None IMPRESSION: No acute abnormalities. Progressive atrophy. Extensive chronic small vessel ischemic changes. Electronically Signed   By: Lorriane Shire M.D.   On: 10/21/2019 15:11   CT Chest W Contrast  Result Date: 10/21/2019 CLINICAL DATA:  Weakness. Patient received a COVID vaccine yesterday. Right upper lobe mass on conventional radiographs. EXAM: CT CHEST WITH CONTRAST TECHNIQUE: Multidetector CT imaging of the chest was performed during intravenous contrast administration. CONTRAST:  46m OMNIPAQUE IOHEXOL 300 MG/ML  SOLN COMPARISON:  Chest radiograph 10/21/2019 CT chest from 07/22/2017 FINDINGS: Cardiovascular: Atherosclerotic calcification of the aortic arch left anterior descending and circumflex coronary arteries. Mediastinum/Nodes: Right hilar lymph node 2.4 cm in short axis on image 55/2, displacing adjacent pulmonary arterial and pulmonary venous structures. Adjacent right  lower paratracheal adenopathy including a node extending anterior to the carina measuring 1.6 cm in short axis on image 53/2, and an adjacent paratracheal node measuring 1.9 cm short axis on image 51/2. All 3 of these lymph nodes are tangential to each other without intact fat planes in between. However, I do not observe over contralateral thoracic adenopathy. Small type 1 hiatal hernia. Lungs/Pleura: A 4.3 by 3.1 by 3.0 cm solid-appearing mass of the right upper lobe abuts the major fissure, minor  fissure, and lateral pleural margin, without compelling findings of adjacent rib destruction or chest wall invasion. There is some mild lobularity along the minor fissure margin of the mass for example on image 18/5, but without definite trans fissural extension. 2 by 3 mm mm right lower lobe subpleural nodule is likely a benign subpleural lymph node on image 82/7, but not entirely specific in this setting. Post radiation therapy related findings anteriorly in the left lung. Upper Abdomen: A 2.2 by 1.7 cm right adrenal mass is present on image 131/2, stable from 07/22/2017. A 1.0 by 1.7 cm stable nodules present in the left adrenal gland (image 127/2). Because these nodules were present in 2018 but the lung mass was not, these are likely benign lesions such as adenomas. Musculoskeletal: The mass is adjacent to the right fifth and sixth ribs, but no rib involvement or rib destruction is currently identified. Thoracic kyphosis is present along with levoconvex lower thoracic scoliosis. Thoracic spondylosis noted. Bilateral mastectomies noted. IMPRESSION: 1. 4.3 cm solid right upper lobe mass abutting the major fissure, minor fissure, and lateral pleural margin, without chest wall invasion. There is associated right hilar and paratracheal adenopathy including a lymph node abutting the carina, but no contralateral adenopathy. Assuming non-small cell lung cancer this likely represents T2b N2 M0 disease (stage IIIA). 2.  Bilateral adrenal masses are stable from, but the lung mass was not present-this indicates that the adrenal masses are likely benign. 3.  Aortic Atherosclerosis (ICD10-I70.0).  Coronary atherosclerosis. Electronically Signed   By: Van Clines M.D.   On: 10/21/2019 15:38   MR BRAIN WO CONTRAST  Result Date: 10/23/2019 CLINICAL DATA:  Neuro deficit, subacute. Additional provided: Weakness post second COVID vaccine 10/20/2019, patient reports right leg pain, history of dementia, history of breast cancer EXAM: MRI HEAD WITHOUT CONTRAST TECHNIQUE: Multiplanar, multiecho pulse sequences of the brain and surrounding structures were obtained without intravenous contrast. COMPARISON:  Noncontrast head CT 10/21/2019, brain MRI 08/14/2017. FINDINGS: Brain: Mild intermittent motion degradation. Please note there is limited evaluation for intracranial metastatic disease on this noncontrast examination. There is no evidence of acute infarct. No evidence of intracranial mass. No midline shift or extra-axial fluid collection. Again demonstrated is advanced patchy and confluent T2/FLAIR hyperintensity within the cerebral white matter which is nonspecific, but consistent with chronic small vessel ischemic disease. As before, there are multiple chronic lacunar infarcts in the bilateral basal ganglia, thalami and within the cerebellar hemispheres. Chronic lacunar infarcts appear more numerous within the right cerebellar hemisphere as compared to prior MRI 08/14/2017. Several chronic microhemorrhages within the right temporal occipital and left parietal lobes are unchanged. Moderate generalized parenchymal atrophy. Vascular: Flow voids maintained within the proximal large arterial vessels. Skull and upper cervical spine: No focal marrow lesion. Sinuses/Orbits: Visualized orbits demonstrate no acute abnormality. Minimal ethmoid sinus mucosal thickening. No significant mastoid effusion. IMPRESSION: 1. Evaluation for intracranial  metastatic disease is limited on this non-contrast examination. 2. No evidence of acute intracranial abnormality, including acute infarction. 3. Advanced chronic small vessel ischemic disease with multiple chronic lacunar infarcts. Chronic lacunar infarcts within the right cerebellum appear more numerous as compared to prior MRI 08/14/2017. 4. Moderate generalized parenchymal atrophy. Electronically Signed   By: Kellie Simmering DO   On: 10/23/2019 14:14    Microbiology: Recent Results (from the past 240 hour(s))  SARS CORONAVIRUS 2 (TAT 6-24 HRS) Nasopharyngeal Nasopharyngeal Swab     Status: None   Collection Time: 10/21/19  3:57 PM   Specimen: Nasopharyngeal Swab  Result Value Ref Range Status   SARS Coronavirus 2 NEGATIVE NEGATIVE Final    Comment: (NOTE) SARS-CoV-2 target nucleic acids are NOT DETECTED. The SARS-CoV-2 RNA is generally detectable in upper and lower respiratory specimens during the acute phase of infection. Negative results do not preclude SARS-CoV-2 infection, do not rule out co-infections with other pathogens, and should not be used as the sole basis for treatment or other patient management decisions. Negative results must be combined with clinical observations, patient history, and epidemiological information. The expected result is Negative. Fact Sheet for Patients: SugarRoll.be Fact Sheet for Healthcare Providers: https://www.woods-mathews.com/ This test is not yet approved or cleared by the [place] FDA and  has been authorized for detection and/or diagnosis of SARS-CoV-2 by FDA under an Emergency Use Authorization (EUA). This EUA will remain  in effect (meaning this test can be used) for the duration of the COVID-19 declaration under Section 56 4(b)(1) of the Act, 21 U.S.C. section 360bbb-3(b)(1), unless the authorization is terminated or revoked sooner. Performed at [facility], [place] [address]  [place], [place] [address]   Urine culture     Status: Abnormal (Preliminary result)   Collection Time: 10/22/19 12:50 PM   Specimen: Urine, Random  Result Value Ref Range Status   Specimen Description   Final    URINE, RANDOM Performed at [facility] [address], [place], [place] [address]    Special Requests   Final    NONE Performed at [facility], [place] [address], [place], [place] [address]    Culture >=100,000 COLONIES/mL KLEBSIELLA PNEUMONIAE (A)  Final   Report Status PENDING  Incomplete   Organism ID, Bacteria KLEBSIELLA PNEUMONIAE (A)  Final      Susceptibility   Klebsiella pneumoniae - MIC*    AMPICILLIN >=32 RESISTANT Resistant     CEFAZOLIN <=4 SENSITIVE Sensitive     CEFTRIAXONE <=0.25 SENSITIVE Sensitive     CIPROFLOXACIN <=0.25 SENSITIVE Sensitive     GENTAMICIN <=1 SENSITIVE Sensitive     IMIPENEM <=0.25 SENSITIVE Sensitive     NITROFURANTOIN 32 SENSITIVE Sensitive     TRIMETH/SULFA <=20 SENSITIVE Sensitive     AMPICILLIN/SULBACTAM 8 SENSITIVE Sensitive     PIP/TAZO <=4 SENSITIVE Sensitive     * >=100,000 COLONIES/mL KLEBSIELLA PNEUMONIAE     Labs: Basic Metabolic Panel: Recent Labs  Lab 10/21/19 1302 10/22/19 0435 10/23/19 0743  NA 139 140 139  K 4.5 4.0 4.0  CL 107 107 106  CO2 '24 24 24  ' GLUCOSE 126* 129* 171*  BUN '21 18 20  ' CREATININE 0.77 0.67 0.76  CALCIUM 8.9 8.8* 8.8*  MG  --  2.4  --   PHOS  --  3.9  --    Liver Function Tests: Recent Labs  Lab 10/21/19 1302  AST 18  ALT 12  ALKPHOS 78  BILITOT 0.9  PROT 6.8  ALBUMIN 3.6   No results for input(s): LIPASE, AMYLASE in the last 168 hours. No results for input(s): AMMONIA in the last 168 hours. CBC: Recent Labs  Lab 10/21/19 1319 10/22/19 0435 10/23/19 0743  WBC 5.1 3.5* 4.0  NEUTROABS 4.2  --  2.8  HGB 13.5 12.8 13.8  HCT 40.2 38.3 41.5  MCV 97.1 97.2 97.6  PLT 147* 139* 168   Cardiac Enzymes: Recent Labs  Lab 10/22/19 0435    CKTOTAL 47   BNP: BNP (last 3 results) Recent Labs    08/05/19 1215  BNP 133.6*  ProBNP (last 3 results) No results for input(s): PROBNP in the last 8760 hours.  CBG: Recent Labs  Lab 10/22/19 2159 10/23/19 0742 10/23/19 1125 10/23/19 1622 10/23/19 2152  GLUCAP 126* 153* 184* 204* 146*       Signed:  Kayleen Memos, MD [facility] 10/24/2019, 11:59 AM

## 2019-10-24 NOTE — Progress Notes (Signed)
Discharge instructions discussed with husband, verbalized agreement and understanding, case management notified of need for ambulance transport

## 2019-10-24 NOTE — Discharge Instructions (Addendum)
Urinary Tract Infection, Adult A urinary tract infection (UTI) is an infection of any part of the urinary tract. The urinary tract includes:  The kidneys.  The ureters.  The bladder.  The urethra. These organs make, store, and get rid of pee (urine) in the body. What are the causes? This is caused by germs (bacteria) in your genital area. These germs grow and cause swelling (inflammation) of your urinary tract. What increases the risk? You are more likely to develop this condition if:  You have a small, thin tube (catheter) to drain pee.  You cannot control when you pee or poop (incontinence).  You are female, and: ? You use these methods to prevent pregnancy:  A medicine that kills sperm (spermicide).  A device that blocks sperm (diaphragm). ? You have low levels of a female hormone (estrogen). ? You are pregnant.  You have genes that add to your risk.  You are sexually active.  You take antibiotic medicines.  You have trouble peeing because of: ? A prostate that is bigger than normal, if you are female. ? A blockage in the part of your body that drains pee from the bladder (urethra). ? A kidney stone. ? A nerve condition that affects your bladder (neurogenic bladder). ? Not getting enough to drink. ? Not peeing often enough.  You have other conditions, such as: ? Diabetes. ? A weak disease-fighting system (immune system). ? Sickle cell disease. ? Gout. ? Injury of the spine. What are the signs or symptoms? Symptoms of this condition include:  Needing to pee right away (urgently).  Peeing often.  Peeing small amounts often.  Pain or burning when peeing.  Blood in the pee.  Pee that smells bad or not like normal.  Trouble peeing.  Pee that is cloudy.  Fluid coming from the vagina, if you are female.  Pain in the belly or lower back. Other symptoms include:  Throwing up (vomiting).  No urge to eat.  Feeling mixed up (confused).  Being tired  and grouchy (irritable).  A fever.  Watery poop (diarrhea). How is this treated? This condition may be treated with:  Antibiotic medicine.  Other medicines.  Drinking enough water. Follow these instructions at home:  Medicines  Take over-the-counter and prescription medicines only as told by your doctor.  If you were prescribed an antibiotic medicine, take it as told by your doctor. Do not stop taking it even if you start to feel better. General instructions  Make sure you: ? Pee until your bladder is empty. ? Do not hold pee for a long time. ? Empty your bladder after sex. ? Wipe from front to back after pooping if you are a female. Use each tissue one time when you wipe.  Drink enough fluid to keep your pee pale yellow.  Keep all follow-up visits as told by your doctor. This is important. Contact a doctor if:  You do not get better after 1-2 days.  Your symptoms go away and then come back. Get help right away if:  You have very bad back pain.  You have very bad pain in your lower belly.  You have a fever.  You are sick to your stomach (nauseous).  You are throwing up. Summary  A urinary tract infection (UTI) is an infection of any part of the urinary tract.  This condition is caused by germs in your genital area.  There are many risk factors for a UTI. These include having a small, thin  tube to drain pee and not being able to control when you pee or poop.  Treatment includes antibiotic medicines for germs.  Drink enough fluid to keep your pee pale yellow. This information is not intended to replace advice given to you by your health care provider. Make sure you discuss any questions you have with your health care provider. Document Revised: 07/23/2018 Document Reviewed: 02/12/2018 Elsevier Patient Education  [place]  A lung mass is a growth in the lung that is larger than 3 centimeters (1.2 inches). Smaller growths are called  nodules. Most lung nodules are not cancer. Lung masses have a higher risk of being cancer. A lung mass is sometimes found during a routine chest X-ray or while doing other imaging tests to check for other problems. What are common types of lung masses? Lung masses include:  Tumors. These may be cancerous (malignant) or noncancerous (benign).  Infectious masses (granulomas). These are masses caused by inflammation from bacterial infections, like tuberculosis, or fungal infections.  Noninfectious masses. Some diseases that cause lung inflammation may also cause lung masses to form.  Blood vessel malformations. What type of testing may be needed? Your health care provider may recommend that you have tests to diagnose the cause of your lung mass. The following tests may be done if a lung mass is found:  Physical exam.  Imaging tests, such as: ? Chest X-rays. ? CT scan. ? PET scan. This scan measures how much energy a mass is using.  Biopsy to rule out cancer or confirm a diagnosis. This procedure involves removing a tissue sample from the mass with a needle inserted through the chest, using a scope placed down into the lung, or through open surgery. Tests and physical exams may be done once, or they may be done regularly for a period of time. Tests and exams that are done regularly will help monitor whether the mass or tissue change is growing and becoming a concern. What are common treatments? Treatment for a lung mass depends on the cause. Noncancerous masses may require treatment specific to the cause. Treatment options for a cancerous lung mass may include:  Surgical removal.  Radiation therapy.  Chemotherapy. Follow these instructions at home: If you have had surgery or a biopsy, your health care provider will give you specific instructions for taking care of yourself at home after your procedure. Follow these instructions carefully. General home care instructions include:  Take  over-the-counter and prescription medicines only as told by your health care provider.  Return to your normal activities as told by your health care provider. Ask your health care provider what activities are safe for you.  Do not use any products that contain nicotine or tobacco, such as cigarettes, e-cigarettes, and chewing tobacco. If you need help quitting, ask your health care provider.  Keep all follow-up visits as told by your health care provider. This is important. Contact a health care provider if you:  Have pain in your chest, back, or shoulder.  Are short of breath.  Have a cough.  Cough up blood or bloody sputum. Summary  A lung mass is a growth in the lung that is larger than 3 centimeters (1.2 inches).  Lung masses have a higher risk of being cancer than do smaller growths.  Sometimes a lung mass is found during a routine chest X-ray or other imaging test.  Your health care provider may do a biopsy of the lung mass to rule out or  confirm cancer.  Treatment for this condition depends on the cause. This information is not intended to replace advice given to you by your health care provider. Make sure you discuss any questions you have with your health care provider. Document Revised: 11/27/2018 Document Reviewed: 03/31/2018 Elsevier Patient Education  [place].

## 2019-10-24 NOTE — TOC Progression Note (Signed)
Transition of Care [facility]) - Progression Note    Patient Details  Name: [name] MRN: [id number] Date of Birth: 03-16-1946  Transition of [facility]) CM/SW Contact  Joaquin Courts, RN Phone Number: 10/24/2019, 12:38 PM  Clinical Narrative:  PTAR transportation arranged.      Expected Discharge Plan: [place] Barriers to Discharge: No Barriers Identified  Expected Discharge Plan and Services Expected Discharge Plan: [place] In-house Referral: Clinical Social Work   Post Acute Care Choice: [place] arrangements for the past 2 months: [place] Expected Discharge Date: 10/24/19               DME Arranged: Hospital bed, Bedside commode DME Agency: [employer] Date DME Agency Contacted: 10/23/19 Time DME Agency Contacted: [id number] Representative spoke with at DME Agency: Thedore Mins and [place] Arranged: RN, PT, OT, Nurse's Aide [place] Agency: [place] Date [place]: 10/23/19 Time [place]: 1400 Representative spoke with at [place]: [place] ([place]) Interventions    Readmission Risk Interventions No flowsheet data found.

## 2019-10-24 NOTE — Progress Notes (Signed)
Patient transported home via [place], husband notified that patient is on her way.

## 2019-10-25 ENCOUNTER — Telehealth: Payer: Self-pay | Admitting: *Deleted

## 2019-10-25 NOTE — Telephone Encounter (Signed)
Transition Care Management Follow-up Telephone Call  Date of discharge and from where: 10/24/19  How have you been since you were released from the hospital? Better, weak in a wheelchair. Found lung mass in the hospital. Had double mastectomy in 2018  Any questions or concerns? No   Items Reviewed:  Did the pt receive and understand the discharge instructions provided? Yes   Medications obtained and verified? Yes   Any new allergies since your discharge? No   Dietary orders reviewed? Yes  Do you have support at home? Yes   Other (ie: DME, Home Health, etc) Home Health Ordered  Functional Questionnaire: (I = Independent and D = Dependent) ADL's: I with assistance  Bathing/Dressing- I with assistance   Meal Prep- D  Eating- I  Maintaining continence- I  Transferring/Ambulation- D Wheelchair  Managing Meds- I with assistance   Follow up appointments reviewed:    PCP Hospital f/u appt confirmed? Yes Scheduled to see Janett Billow on 10/27/19.  [facility] f/u appt confirmed? No  Waiting for Biopsy to be scheduled.  Are transportation arrangements needed? No   If their condition worsens, is the pt aware to call  their PCP or go to the ED? Yes  Was the patient provided with contact information for the PCP's office or ED? Yes  Was the pt encouraged to call back with questions or concerns? Yes

## 2019-10-26 ENCOUNTER — Encounter (HOSPITAL_COMMUNITY): Payer: Self-pay | Admitting: Radiology

## 2019-10-26 DIAGNOSIS — I110 Hypertensive heart disease with heart failure: Secondary | ICD-10-CM | POA: Diagnosis not present

## 2019-10-26 DIAGNOSIS — Z794 Long term (current) use of insulin: Secondary | ICD-10-CM | POA: Diagnosis not present

## 2019-10-26 DIAGNOSIS — Z7901 Long term (current) use of anticoagulants: Secondary | ICD-10-CM | POA: Diagnosis not present

## 2019-10-26 DIAGNOSIS — M6281 Muscle weakness (generalized): Secondary | ICD-10-CM | POA: Diagnosis not present

## 2019-10-26 DIAGNOSIS — N390 Urinary tract infection, site not specified: Secondary | ICD-10-CM | POA: Diagnosis not present

## 2019-10-26 DIAGNOSIS — R918 Other nonspecific abnormal finding of lung field: Secondary | ICD-10-CM | POA: Diagnosis not present

## 2019-10-26 DIAGNOSIS — I5042 Chronic combined systolic (congestive) and diastolic (congestive) heart failure: Secondary | ICD-10-CM | POA: Diagnosis not present

## 2019-10-26 DIAGNOSIS — B961 Klebsiella pneumoniae [K. pneumoniae] as the cause of diseases classified elsewhere: Secondary | ICD-10-CM | POA: Diagnosis not present

## 2019-10-26 DIAGNOSIS — E119 Type 2 diabetes mellitus without complications: Secondary | ICD-10-CM | POA: Diagnosis not present

## 2019-10-26 NOTE — Progress Notes (Signed)
Charlene Rubio. [place] Female, 74 y.o., 09-06-1945 MRN:  [id number] Phone:  [phone] Jerilynn Mages) PCP:  Lauree Chandler, NP Coverage:  [place] [facility] 10/27/2019 at 9:30 AM  RE: CT Biopsy Received: Today Message Contents  Corrie Mckusick, DO  Garth Bigness D  OK for CT guided right lung mass biopsy.    Recent hospitalization, with inpt request. Has been discharged for this follow up biopsy.   Earleen Newport       Previous Messages   ----- Message -----  From: Garth Bigness D  Sent: 10/26/2019  9:22 AM EST  To: Ir Procedure Requests  Subject: CT Biopsy                     Procedure:  CT Biopsy   Reason:  Lung Mass   History:  MR, CT in computer   Provider:  Wilber Bihari CORNETTO   Provider Contact:  [phone]

## 2019-10-26 NOTE — Progress Notes (Signed)
NS 1  Charlene Rubio Female, 74 y.o., 1945/12/06 MRN:  [id number] Phone:  [phone] Charlene Rubio) PCP:  Charlene Chandler, NP Coverage:  [facility] 10/27/2019 at 9:30 AM  RE: CT Biopsy Received: Today Message Contents  Larey Dresser, MD  Garth Bigness D  OK       Previous Messages   ----- Message -----  From: Garth Bigness D  Sent: 10/26/2019  9:25 AM EST  To: Larey Dresser, MD, *  Subject: FW: CT Biopsy                   Dr. Aundra Dubin, patient has an order in epic for a biopsy to be done. I just need to know if it is okay to hold patients Eliquis for 2 days prior to Biopsy.  Thanks Aniceto Boss  ----- Message -----  From: Garth Bigness D  Sent: 10/26/2019  9:22 AM EST  To: Ir Procedure Requests  Subject: CT Biopsy                     Procedure:  CT Biopsy   Reason:  Lung Mass   History:  MR, CT in computer   Provider:  Wilber Bihari CORNETTO   Provider Contact:  [phone]

## 2019-10-27 ENCOUNTER — Other Ambulatory Visit: Payer: Self-pay

## 2019-10-27 ENCOUNTER — Encounter: Payer: Self-pay | Admitting: Nurse Practitioner

## 2019-10-27 ENCOUNTER — Ambulatory Visit (INDEPENDENT_AMBULATORY_CARE_PROVIDER_SITE_OTHER): Payer: Medicare Other | Admitting: Nurse Practitioner

## 2019-10-27 DIAGNOSIS — R600 Localized edema: Secondary | ICD-10-CM

## 2019-10-27 DIAGNOSIS — I5042 Chronic combined systolic (congestive) and diastolic (congestive) heart failure: Secondary | ICD-10-CM | POA: Diagnosis not present

## 2019-10-27 DIAGNOSIS — R918 Other nonspecific abnormal finding of lung field: Secondary | ICD-10-CM | POA: Diagnosis not present

## 2019-10-27 DIAGNOSIS — Z66 Do not resuscitate: Secondary | ICD-10-CM

## 2019-10-27 DIAGNOSIS — I825Y2 Chronic embolism and thrombosis of unspecified deep veins of left proximal lower extremity: Secondary | ICD-10-CM | POA: Diagnosis not present

## 2019-10-27 DIAGNOSIS — C773 Secondary and unspecified malignant neoplasm of axilla and upper limb lymph nodes: Secondary | ICD-10-CM | POA: Insufficient documentation

## 2019-10-27 DIAGNOSIS — R197 Diarrhea, unspecified: Secondary | ICD-10-CM | POA: Diagnosis not present

## 2019-10-27 DIAGNOSIS — E119 Type 2 diabetes mellitus without complications: Secondary | ICD-10-CM

## 2019-10-27 DIAGNOSIS — Z794 Long term (current) use of insulin: Secondary | ICD-10-CM

## 2019-10-27 DIAGNOSIS — N3000 Acute cystitis without hematuria: Secondary | ICD-10-CM

## 2019-10-27 DIAGNOSIS — I5022 Chronic systolic (congestive) heart failure: Secondary | ICD-10-CM

## 2019-10-27 DIAGNOSIS — F0390 Unspecified dementia without behavioral disturbance: Secondary | ICD-10-CM

## 2019-10-27 MED ORDER — SACCHAROMYCES BOULARDII 250 MG PO CAPS: 250.0000 mg | ORAL_CAPSULE | Freq: Two times a day (BID) | ORAL | 2 refills | Status: AC

## 2019-10-27 NOTE — Progress Notes (Signed)
This service is provided via telemedicine  No vital signs collected/recorded due to the encounter was a telemedicine visit.   Location of patient (ex: home, work):  Home   Patient consents to a telephone visit:  Yes   Location of the provider (ex: office, home):  [facility], Office   Name of any referring provider:  N/A  Names of all persons participating in the telemedicine service and their role in the encounter:  S.Chrae B/CMA, Sherrie Mustache, NP, Spouse(Charlene Rubio) and Patient   Time spent on call:  8 min with medical assistant     Careteam: Patient Care Team: Lauree Chandler, NP as PCP - General (Geriatric Medicine) Larey Dresser, MD as PCP - Advanced Heart Failure (Cardiology) Nicholas Lose, MD as Consulting Physician (Hematology and Oncology) Eppie Gibson, MD as Attending Physician (Radiation Oncology) Fanny Skates, MD as Consulting Physician (General Surgery) Nicholas Lose, MD as Consulting Physician (Hematology and Oncology)  PLACE OF SERVICE:  [place]  Advanced Directive information    Allergies  Allergen Reactions  . Chlorhexidine     Criteria no longer met    Chief Complaint  Patient presents with  . [facility] follow-up from 3/4-3/7 for mass in right lung. Telehealth/Video Visit      HPI: Patient is a 74 y.o. female for hospital follow up. Pt iwht hx of dementia, she had bilateral breast cancer now s/p mastectomy in 2018 on femara, hx of left lower leg DVT on eliquis, CHF, insulin dependent type 2 diabetes, aortic valve disorder, htn. She went to the ED due to sudden onset bilateral LE weakness after her COVID vaccine. She was found to have mass in lungs on xray confirmed with CT - biopsy planned to be obtained in the outpatient setting through IR and follow up with oncology. Husband reports the patient does not know about the mass- she does not want to tell her anything any they get the results.  biopsy is  scheduled for next week.  She is very complaint with procedures.  She is very forgetful and husband explains everything but then does not remember the next moment.  Reports she has been coughing for about a month. Not a bad cough.  Ongoing LE swelling, does not move or ambulate like she should Does not put feet up.  Prior to the hospital could not even stand up, at this time she is able to stand.  Husband reports they now have a hospital bed.  They are now using a walker.   It was also noted that she had a UTI and she was placed on keflex she has a few days left. No symptoms noted by husband. She does not complain.   Today husband reports she is doing very well.   DM- a1c was 6.2 and continues on home regimen, no low blood sugars noted.   Dementia- eating and swelling well. Functional status has declined, Memory has significantly gotten worse. Husband reports goal is quality of life.   Diarrhea- ongoing, better actually at this time on probiotic since she is on antibiotic   Review of Systems:  Review of Systems  Unable to perform ROS: Dementia    Past Medical History:  Diagnosis Date  . Acute diastolic (congestive) heart failure ([place]) 04/25/2017  . Acute respiratory failure with hypoxia and hypercapnia ([place]) 04/25/2017  . ALLERGIC RHINITIS 07/01/2007   Qualifier: Diagnosis of  By: Sherren Mocha MD, Jory Ee   . Allergy   .  Aortic insufficiency   . Aortic valve disorder 07/01/2007   Qualifier: Diagnosis of  By: Sherren Mocha MD, Jory Ee   . Arthritis   . Bilateral leg edema 04/25/2017  . Borderline diabetic   . Cancer [facility])    Breast cancer  . CHF (congestive heart failure) ([place]) 04/25/2017  . Dementia ([place]) 04/30/2017  . Diabetes mellitus ([place]) 04/25/2017  . DOE (dyspnea on exertion) 04/25/2017  . Essential hypertension 07/01/2007   Qualifier: Diagnosis of  By: Sherren Mocha MD, Jory Ee Glaucoma suspect   . Hypertension   . KNEE PAIN, LEFT 04/06/2010   Qualifier: Diagnosis of  By: Sherren Mocha MD, Jory Ee    . Nonischemic cardiomyopathy ([place])   . Obesity   . Obesity, unspecified 07/01/2007   Qualifier: Diagnosis of  By: Sherren Mocha MD, Jory Ee   . Peripheral vascular disease ([place])    blood clots in legs  . Thyroid disease    Past Surgical History:  Procedure Laterality Date  . CESAREAN SECTION    . MASTECTOMY  08/27/2017   LEFT BREAST MODIFIED RADICAL MASTECTOMY (Left Breast)  . MASTECTOMY  08/27/2017   RIGHT TOTAL MASTECTOMY WITH SENTINEL LYMPH NODE BIOPSY (Right Breast)  . MASTECTOMY W/ SENTINEL NODE BIOPSY Right 08/27/2017   Procedure: RIGHT TOTAL MASTECTOMY WITH SENTINEL LYMPH NODE BIOPSY;  Surgeon: Fanny Skates, MD;  Location: [place];  Service: General;  Laterality: Right;  . MODIFIED MASTECTOMY Left 08/27/2017   Procedure: LEFT BREAST MODIFIED RADICAL MASTECTOMY;  Surgeon: Fanny Skates, MD;  Location: [place];  Service: General;  Laterality: Left;  . PORTACATH PLACEMENT Right 10/09/2017  . PORTACATH PLACEMENT Right 10/09/2017   Procedure: INSERTION PORT-A-CATH WITH ULTRASOUND;  Surgeon: Fanny Skates, MD;  Location: [place];  Service: General;  Laterality: Right;   Social History:   reports that she has never smoked. She has never used smokeless tobacco. She reports that she does not drink alcohol or use drugs.  Family History  Problem Relation Age of Onset  . Alzheimer's disease Mother 11  . Alzheimer's disease Father 44  . Congestive Heart Failure Father   . Alcoholism Brother   . Alzheimer's disease Brother   . Dementia Brother   . Alcoholism Brother   . Diabetes Brother     Medications: Patient's Medications  New Prescriptions   No medications on file  Previous Medications   ALPHAGAN P 0.1 % SOLN    Place 1 drop into both eyes daily.   B-D UF III MINI PEN NEEDLES 31G X 5 MM MISC    1 EACH BY DOES NOT APPLY ROUTE 2 (TWO) TIMES DAILY. USE TWICE DAILY WITH INSULIN PEN. DX: E11.9   BLOOD GLUCOSE MONITORING SUPPL (ONE TOUCH ULTRA 2) W/DEVICE KIT    Use to test blood sugar 2  times  daily DX: E11.9   CARVEDILOL (COREG) 6.25 MG TABLET    Take 1 tablet (6.25 mg total) by mouth 2 (two) times daily.   CEPHALEXIN (KEFLEX) 500 MG CAPSULE    Take 1 capsule (500 mg total) by mouth every 8 (eight) hours for 7 days.   CONTINUOUS BLOOD GLUC RECEIVER (DEXCOM G6 RECEIVER) DEVI    1 Device by Does not apply route as directed.   CONTINUOUS BLOOD GLUC SENSOR (DEXCOM G6 SENSOR) MISC    1 Device by Does not apply route as directed.   CONTINUOUS BLOOD GLUC TRANSMIT (DEXCOM G6 TRANSMITTER) MISC    1 Device by Does not apply route as directed.  DONEPEZIL (ARICEPT) 10 MG TABLET    TAKE 1 TABLET BY MOUTH AT  BEDTIME   ELIQUIS 5 MG TABS TABLET    TAKE 1 TABLET BY MOUTH TWICE A DAY   ENTRESTO 24-26 MG    TAKE 1 TABLET BY MOUTH TWICE A DAY   FUROSEMIDE (LASIX) 80 MG TABLET    TAKE 1 TABLET BY MOUTH  DAILY   GLUCOSE BLOOD (ONETOUCH VERIO) TEST STRIP    One Touch Verio. Use to test blood sugar twice daily Dx: E11.9   INSULIN LISPRO PROT & LISPRO (HUMALOG MIX 75/25 KWIKPEN) (75-25) 100 UNIT/ML KWIKPEN    Inject 12 Units into the skin 2 (two) times daily with a meal.   KLOR-CON M20 20 MEQ TABLET    TAKE 1 TABLET BY MOUTH EVERY DAY   LETROZOLE (FEMARA) 2.5 MG TABLET    Take 1 tablet (2.5 mg total) by mouth daily.   MEMANTINE (NAMENDA) 10 MG TABLET    Take 1 tablet (10 mg total) by mouth 2 (two) times daily.   ONETOUCH DELICA LANCETS 57D MISC    Use as instructed to test blood sugar 2 times daily DX: E11.9   SACCHAROMYCES BOULARDII (FLORASTOR) 250 MG CAPSULE    Take 1 capsule (250 mg total) by mouth 2 (two) times daily for 7 days.   SPIRONOLACTONE (ALDACTONE) 25 MG TABLET    TAKE 1/2 TABLET BY MOUTH EVERY DAY  Modified Medications   No medications on file  Discontinued Medications   No medications on file    Physical Exam:  There were no vitals filed for this visit. There is no height or weight on file to calculate BMI. Wt Readings from Last 3 Encounters:  10/23/19 183 lb 6.8 oz (83.2 kg)    08/05/19 183 lb 12.8 oz (83.4 kg)  06/14/19 179 lb (81.2 kg)     Labs reviewed: Basic Metabolic Panel: Recent Labs    03/08/19 0933 06/14/19 1210 10/21/19 1302 10/21/19 1946 10/22/19 0435 10/23/19 0743  NA 137   < > 139  --  140 139  K 4.5   < > 4.5  --  4.0 4.0  CL 103   < > 107  --  107 106  CO2 24   < > 24  --  24 24  GLUCOSE 318*   < > 126*  --  129* 171*  BUN 18   < > 21  --  18 20  CREATININE 0.86   < > 0.77  --  0.67 0.76  CALCIUM 9.5   < > 8.9  --  8.8* 8.8*  MG  --   --   --   --  2.4  --   PHOS  --   --   --   --  3.9  --   TSH 1.86  --   --  1.234  --   --    < > = values in this interval not displayed.   Liver Function Tests: Recent Labs    03/08/19 0933 06/14/19 1210 10/21/19 1302  AST '11 12 18  ' ALT '10 10 12  ' ALKPHOS  --   --  78  BILITOT 0.6 0.4 0.9  PROT 6.5 6.2 6.8  ALBUMIN  --   --  3.6   No results for input(s): LIPASE, AMYLASE in the last 8760 hours. No results for input(s): AMMONIA in the last 8760 hours. CBC: Recent Labs    06/14/19 1210 08/05/19 1215 10/21/19 1319 10/22/19  0435 10/23/19 0743  WBC 6.0   < > 5.1 3.5* 4.0  NEUTROABS 4,824  --  4.2  --  2.8  HGB 13.5   < > 13.5 12.8 13.8  HCT 40.0   < > 40.2 38.3 41.5  MCV 97.6   < > 97.1 97.2 97.6  PLT 203   < > 147* 139* 168   < > = values in this interval not displayed.   Lipid Panel: Recent Labs    06/14/19 1210  CHOL 146  HDL 42*  LDLCALC 83  TRIG 110  CHOLHDL 3.5   TSH: Recent Labs    03/08/19 0933 10/21/19 1946  TSH 1.86 1.234   A1C: Lab Results  Component Value Date   HGBA1C 6.2 (H) 10/21/2019     Assessment/Plan 1. Mass of upper lobe of right lung Finding during recent hospitalization. Pts husband has schedule for biopsy through IR and oncology follow up.   2. Chronic combined systolic and diastolic congestive heart failure (HCC) -stable no signs of fluid overload, shortness of breath. Continues on coreg, entresto and lasix  3. Chronic deep vein  thrombosis (DVT) of proximal vein of left lower extremity (HCC) No signs of recurrence on eliquis.   4. Diarrhea, unspecified type -chronic, has improved since in hospital and back home. Currently on florastore  - saccharomyces boulardii (FLORASTOR) 250 MG capsule; Take 1 capsule (250 mg total) by mouth 2 (two) times daily.  Dispense: 60 capsule; Refill: 2  5. DNR (do not resuscitate) - DNR (Do Not Resuscitate)  6. 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 Now with new lung mass.   7. Type 2 diabetes mellitus with complication, with long-term current use of insulin (HCC) -a1c at goal continues on humalog 75/25, 12 units BID.   8. Chronic systolic congestive heart failure ([place]) -stable continues on enstresto 24-26 BID, aldactone and coreg.    9. Dementia without behavioral disturbance, unspecified dementia type ([place]) -progressive decline, husband reports memory and functional decline noted. Supportive care at this time. Goal is comfort.   10. Bilateral leg edema -stable, reports she is very sedentary and does not prefer to elevate the legs. Does wear compression hose as tolerates.   11. Acute cystitis without hematuria Improving mentation and strength, continues on keflex.   Next appt: 4 weeks for MOST form completion via virtual visit/follwup Locke Barrell K. Harle Battiest  [facility] [phone]   Virtual Visit via Video Note  I connected with Charlene Rubio on 10/27/19 at  9:30 AM EST by a video enabled telemedicine application and verified that I am speaking with the correct person using two identifiers.  Location: Patient: home Provider: office    I discussed the limitations of evaluation and management by telemedicine and the availability of in person appointments. The patient expressed understanding and agreed to proceed.    I  discussed the assessment and treatment plan with the patient. The patient was provided an opportunity to ask questions and all were answered. The patient agreed with the plan and demonstrated an understanding of the instructions.   The patient was advised to call back or seek an in-person evaluation if the symptoms worsen or if the condition fails to improve as anticipated.  I provided 25 minutes of non-face-to-face time during this encounter.  Carlos American. Dewaine Oats, AGNP Avs printed and mailed.

## 2019-11-01 ENCOUNTER — Other Ambulatory Visit (HOSPITAL_COMMUNITY)
Admission: RE | Admit: 2019-11-01 | Discharge: 2019-11-01 | Disposition: A | Discharge status: Home / Self Care | Payer: Medicare Other | Source: Ambulatory Visit | Attending: Adult Health | Admitting: Adult Health

## 2019-11-01 DIAGNOSIS — I2510 Atherosclerotic heart disease of native coronary artery without angina pectoris: Secondary | ICD-10-CM | POA: Diagnosis not present

## 2019-11-01 DIAGNOSIS — I110 Hypertensive heart disease with heart failure: Secondary | ICD-10-CM | POA: Diagnosis not present

## 2019-11-01 DIAGNOSIS — E279 Disorder of adrenal gland, unspecified: Secondary | ICD-10-CM | POA: Diagnosis not present

## 2019-11-01 DIAGNOSIS — Z79899 Other long term (current) drug therapy: Secondary | ICD-10-CM | POA: Diagnosis not present

## 2019-11-01 DIAGNOSIS — Z23 Encounter for immunization: Secondary | ICD-10-CM | POA: Diagnosis not present

## 2019-11-01 DIAGNOSIS — F0390 Unspecified dementia without behavioral disturbance: Secondary | ICD-10-CM | POA: Diagnosis not present

## 2019-11-01 DIAGNOSIS — Z794 Long term (current) use of insulin: Secondary | ICD-10-CM | POA: Diagnosis not present

## 2019-11-01 DIAGNOSIS — Z171 Estrogen receptor negative status [ER-]: Secondary | ICD-10-CM | POA: Diagnosis not present

## 2019-11-01 DIAGNOSIS — I739 Peripheral vascular disease, unspecified: Secondary | ICD-10-CM | POA: Diagnosis not present

## 2019-11-01 DIAGNOSIS — Z9013 Acquired absence of bilateral breasts and nipples: Secondary | ICD-10-CM | POA: Diagnosis not present

## 2019-11-01 DIAGNOSIS — E079 Disorder of thyroid, unspecified: Secondary | ICD-10-CM | POA: Diagnosis not present

## 2019-11-01 DIAGNOSIS — I5032 Chronic diastolic (congestive) heart failure: Secondary | ICD-10-CM | POA: Diagnosis not present

## 2019-11-01 DIAGNOSIS — C3490 Malignant neoplasm of unspecified part of unspecified bronchus or lung: Secondary | ICD-10-CM | POA: Diagnosis not present

## 2019-11-01 DIAGNOSIS — Z7901 Long term (current) use of anticoagulants: Secondary | ICD-10-CM | POA: Diagnosis not present

## 2019-11-01 DIAGNOSIS — Z20822 Contact with and (suspected) exposure to covid-19: Secondary | ICD-10-CM | POA: Diagnosis not present

## 2019-11-01 DIAGNOSIS — E669 Obesity, unspecified: Secondary | ICD-10-CM | POA: Diagnosis not present

## 2019-11-01 DIAGNOSIS — Z01818 Encounter for other preprocedural examination: Secondary | ICD-10-CM | POA: Diagnosis not present

## 2019-11-01 DIAGNOSIS — Z86718 Personal history of other venous thrombosis and embolism: Secondary | ICD-10-CM | POA: Diagnosis not present

## 2019-11-01 DIAGNOSIS — Z853 Personal history of malignant neoplasm of breast: Secondary | ICD-10-CM | POA: Diagnosis not present

## 2019-11-01 DIAGNOSIS — I6782 Cerebral ischemia: Secondary | ICD-10-CM | POA: Diagnosis not present

## 2019-11-01 DIAGNOSIS — Z6828 Body mass index (BMI) 28.0-28.9, adult: Secondary | ICD-10-CM | POA: Diagnosis not present

## 2019-11-01 LAB — SARS CORONAVIRUS 2 (TAT 6-24 HRS): SARS Coronavirus 2: NEGATIVE

## 2019-11-02 ENCOUNTER — Other Ambulatory Visit: Payer: Self-pay | Admitting: Physician Assistant

## 2019-11-03 ENCOUNTER — Other Ambulatory Visit: Payer: Self-pay

## 2019-11-03 ENCOUNTER — Other Ambulatory Visit (HOSPITAL_COMMUNITY): Payer: Self-pay | Admitting: Cardiology

## 2019-11-03 ENCOUNTER — Ambulatory Visit (HOSPITAL_COMMUNITY)
Admission: RE | Admit: 2019-11-03 | Discharge: 2019-11-03 | Disposition: A | Discharge status: Home / Self Care | Payer: Medicare Other | Source: Ambulatory Visit | Attending: Diagnostic Radiology | Admitting: Diagnostic Radiology

## 2019-11-03 ENCOUNTER — Other Ambulatory Visit: Payer: Self-pay | Admitting: Nurse Practitioner

## 2019-11-03 ENCOUNTER — Ambulatory Visit (HOSPITAL_COMMUNITY)
Admission: RE | Admit: 2019-11-03 | Discharge: 2019-11-03 | Disposition: A | Discharge status: Home / Self Care | Payer: Medicare Other | Source: Ambulatory Visit | Attending: Adult Health | Admitting: Adult Health

## 2019-11-03 DIAGNOSIS — Z86718 Personal history of other venous thrombosis and embolism: Secondary | ICD-10-CM | POA: Insufficient documentation

## 2019-11-03 DIAGNOSIS — Z853 Personal history of malignant neoplasm of breast: Secondary | ICD-10-CM | POA: Insufficient documentation

## 2019-11-03 DIAGNOSIS — I6782 Cerebral ischemia: Secondary | ICD-10-CM | POA: Insufficient documentation

## 2019-11-03 DIAGNOSIS — Z9889 Other specified postprocedural states: Secondary | ICD-10-CM

## 2019-11-03 DIAGNOSIS — R413 Other amnesia: Secondary | ICD-10-CM

## 2019-11-03 DIAGNOSIS — Z79899 Other long term (current) drug therapy: Secondary | ICD-10-CM | POA: Insufficient documentation

## 2019-11-03 DIAGNOSIS — I739 Peripheral vascular disease, unspecified: Secondary | ICD-10-CM | POA: Insufficient documentation

## 2019-11-03 DIAGNOSIS — R590 Localized enlarged lymph nodes: Secondary | ICD-10-CM | POA: Diagnosis not present

## 2019-11-03 DIAGNOSIS — Z7901 Long term (current) use of anticoagulants: Secondary | ICD-10-CM | POA: Insufficient documentation

## 2019-11-03 DIAGNOSIS — C3490 Malignant neoplasm of unspecified part of unspecified bronchus or lung: Secondary | ICD-10-CM | POA: Insufficient documentation

## 2019-11-03 DIAGNOSIS — I110 Hypertensive heart disease with heart failure: Secondary | ICD-10-CM | POA: Insufficient documentation

## 2019-11-03 DIAGNOSIS — C50212 Malignant neoplasm of upper-inner quadrant of left female breast: Secondary | ICD-10-CM | POA: Diagnosis not present

## 2019-11-03 DIAGNOSIS — F0390 Unspecified dementia without behavioral disturbance: Secondary | ICD-10-CM | POA: Insufficient documentation

## 2019-11-03 DIAGNOSIS — R918 Other nonspecific abnormal finding of lung field: Secondary | ICD-10-CM

## 2019-11-03 DIAGNOSIS — Z794 Long term (current) use of insulin: Secondary | ICD-10-CM | POA: Insufficient documentation

## 2019-11-03 DIAGNOSIS — Z23 Encounter for immunization: Secondary | ICD-10-CM | POA: Insufficient documentation

## 2019-11-03 DIAGNOSIS — E669 Obesity, unspecified: Secondary | ICD-10-CM | POA: Insufficient documentation

## 2019-11-03 DIAGNOSIS — E279 Disorder of adrenal gland, unspecified: Secondary | ICD-10-CM | POA: Insufficient documentation

## 2019-11-03 DIAGNOSIS — I5032 Chronic diastolic (congestive) heart failure: Secondary | ICD-10-CM | POA: Insufficient documentation

## 2019-11-03 DIAGNOSIS — I2510 Atherosclerotic heart disease of native coronary artery without angina pectoris: Secondary | ICD-10-CM | POA: Insufficient documentation

## 2019-11-03 DIAGNOSIS — Z20822 Contact with and (suspected) exposure to covid-19: Secondary | ICD-10-CM | POA: Insufficient documentation

## 2019-11-03 DIAGNOSIS — Z01818 Encounter for other preprocedural examination: Secondary | ICD-10-CM | POA: Insufficient documentation

## 2019-11-03 DIAGNOSIS — Z6828 Body mass index (BMI) 28.0-28.9, adult: Secondary | ICD-10-CM | POA: Insufficient documentation

## 2019-11-03 DIAGNOSIS — Z171 Estrogen receptor negative status [ER-]: Secondary | ICD-10-CM | POA: Insufficient documentation

## 2019-11-03 DIAGNOSIS — C3411 Malignant neoplasm of upper lobe, right bronchus or lung: Secondary | ICD-10-CM | POA: Diagnosis not present

## 2019-11-03 DIAGNOSIS — E079 Disorder of thyroid, unspecified: Secondary | ICD-10-CM | POA: Insufficient documentation

## 2019-11-03 DIAGNOSIS — Z9013 Acquired absence of bilateral breasts and nipples: Secondary | ICD-10-CM | POA: Insufficient documentation

## 2019-11-03 LAB — CBC
HCT: 40.7 % (ref 36.0–46.0)
Hemoglobin: 13.5 g/dL (ref 12.0–15.0)
MCH: 32.4 pg (ref 26.0–34.0)
MCHC: 33.2 g/dL (ref 30.0–36.0)
MCV: 97.6 fL (ref 80.0–100.0)
Platelets: 222 10*3/uL (ref 150–400)
RBC: 4.17 MIL/uL (ref 3.87–5.11)
RDW: 13.1 % (ref 11.5–15.5)
WBC: 6.2 10*3/uL (ref 4.0–10.5)
nRBC: 0 % (ref 0.0–0.2)

## 2019-11-03 LAB — GLUCOSE, CAPILLARY: Glucose-Capillary: 166 mg/dL — ABNORMAL HIGH (ref 70–99)

## 2019-11-03 LAB — PROTIME-INR
INR: 1.1 (ref 0.8–1.2)
Prothrombin Time: 13.8 seconds (ref 11.4–15.2)

## 2019-11-03 MED ORDER — MIDAZOLAM HCL 2 MG/2ML IJ SOLN
INTRAMUSCULAR | Status: AC
  Filled 2019-11-03: qty 2

## 2019-11-03 MED ORDER — FENTANYL CITRATE (PF) 100 MCG/2ML IJ SOLN
INTRAMUSCULAR | Status: AC | PRN
  Administered 2019-11-03: 25 ug via INTRAVENOUS

## 2019-11-03 MED ORDER — SODIUM CHLORIDE 0.9 % IV SOLN: INTRAVENOUS | Status: DC

## 2019-11-03 MED ORDER — FENTANYL CITRATE (PF) 100 MCG/2ML IJ SOLN
INTRAMUSCULAR | Status: AC
  Filled 2019-11-03: qty 2

## 2019-11-03 MED ORDER — MIDAZOLAM HCL 2 MG/2ML IJ SOLN
INTRAMUSCULAR | Status: AC | PRN
  Administered 2019-11-03 (×2): 0.5 mg via INTRAVENOUS

## 2019-11-03 NOTE — Progress Notes (Signed)
Dr Anselm Pancoast in to see pt states CXR was fine and she can eat.

## 2019-11-03 NOTE — H&P (Signed)
Chief Complaint: Patient was seen in consultation today for lung mass  Referring Physician(s): [place]  Supervising Physician: Markus Daft  Patient Status: [facility] - Out-pt  History of Present Illness: Charlene Rubio is a 74 y.o. 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  During her work-up for UTI, she was found to have a new right-sided lung mass.   She was discharged home and is now following with Oncology for her right upper lobe mass.  Request for lung mass biopsy per [name].  Case reviewed and approved by Dr. Earleen Newport.   Patient presents to IR today in her usual state of health.  She has advanced dementia.  She will answer some simple, direct questions correctly, however mostly mimics my facial expressions.  She clearly defers to her husband who is her full-time care giver. She does not participate in conversation or provide any history.   Per her husband, they have been referred here by the hospital and have not seen [name] since the finding of the lung mass.  This is consistent with review of her chart.  He states that with her cancer diagnosis in 2018/2019 the goal was quality of life which they were able to achieve with mastectomy and Femara.  He is hopeful for treatment along the same lines if this biopsy does return diagnostic for cancer.   She has been NPO today.  She last took her Eliquis on Sunday.   Past Medical History:  Diagnosis Date  . Acute diastolic (congestive) heart failure ([place]) 04/25/2017  . Acute respiratory failure with hypoxia and hypercapnia ([place]) 04/25/2017  . ALLERGIC RHINITIS 07/01/2007   Qualifier: Diagnosis of  By: Sherren Mocha MD, Jory Ee   . Allergy   . Aortic insufficiency   . Aortic valve disorder 07/01/2007   Qualifier: Diagnosis of  By: Sherren Mocha MD, Jory Ee   . Arthritis   .  Bilateral leg edema 04/25/2017  . Borderline diabetic   . Cancer [facility])    Breast cancer  . CHF (congestive heart failure) ([place]) 04/25/2017  . Dementia ([place]) 04/30/2017  . Diabetes mellitus ([place]) 04/25/2017  . DOE (dyspnea on exertion) 04/25/2017  . Essential hypertension 07/01/2007   Qualifier: Diagnosis of  By: Sherren Mocha MD, Jory Ee Glaucoma suspect   . Hypertension   . KNEE PAIN, LEFT 04/06/2010   Qualifier: Diagnosis of  By: Sherren Mocha MD, Jory Ee   . Nonischemic cardiomyopathy ([place])   . Obesity   . Obesity, unspecified 07/01/2007   Qualifier: Diagnosis of  By: Sherren Mocha MD, Jory Ee   . Peripheral vascular disease ([place])    blood clots in legs  . Thyroid disease     Past Surgical History:  Procedure Laterality Date  . CESAREAN SECTION    . MASTECTOMY  08/27/2017   LEFT BREAST MODIFIED RADICAL MASTECTOMY (Left Breast)  . MASTECTOMY  08/27/2017   RIGHT TOTAL MASTECTOMY WITH SENTINEL LYMPH NODE BIOPSY (Right Breast)  . MASTECTOMY W/ SENTINEL NODE BIOPSY Right 08/27/2017   Procedure: RIGHT TOTAL MASTECTOMY WITH SENTINEL LYMPH NODE BIOPSY;  Surgeon: Fanny Skates, MD;  Location: [place];  Service: General;  Laterality: Right;  . MODIFIED MASTECTOMY Left 08/27/2017   Procedure: LEFT BREAST MODIFIED RADICAL MASTECTOMY;  Surgeon: Fanny Skates, MD;  Location: [place];  Service: General;  Laterality: Left;  . PORTACATH PLACEMENT Right  10/09/2017  . PORTACATH PLACEMENT Right 10/09/2017   Procedure: INSERTION PORT-A-CATH WITH ULTRASOUND;  Surgeon: Fanny Skates, MD;  Location: [place];  Service: General;  Laterality: Right;    Allergies: Chlorhexidine  Medications: Prior to Admission medications   Medication Sig Start Date End Date Taking? Authorizing Provider  carvedilol (COREG) 6.25 MG tablet Take 1 tablet (6.25 mg total) by mouth 2 (two) times daily. 10/24/19 11/23/19 Yes Hall, Carole N, DO  donepezil (ARICEPT) 10 MG tablet TAKE 1 TABLET BY MOUTH AT  BEDTIME 11/03/19  Yes Eubanks, Carlos American, NP    ELIQUIS 5 MG TABS tablet TAKE 1 TABLET BY MOUTH TWICE A DAY Patient taking differently: Take 5 mg by mouth 2 (two) times daily.  07/27/19  Yes Larey Dresser, MD  ENTRESTO 24-26 MG TAKE 1 TABLET BY MOUTH TWICE A DAY Patient taking differently: Take 1 tablet by mouth in the morning and at bedtime.  10/04/19  Yes Larey Dresser, MD  furosemide (LASIX) 80 MG tablet TAKE 1 TABLET BY MOUTH  DAILY Patient taking differently: Take 80 mg by mouth daily.  01/04/19  Yes Larey Dresser, MD  Insulin Lispro Prot & Lispro (HUMALOG MIX 75/25 KWIKPEN) (75-25) 100 UNIT/ML Kwikpen Inject 12 Units into the skin 2 (two) times daily with a meal. 04/13/19  Yes Shamleffer, Melanie Crazier, MD  KLOR-CON M20 20 MEQ tablet TAKE 1 TABLET BY MOUTH EVERY DAY Patient taking differently: Take 20 mEq by mouth daily.  09/29/19  Yes Larey Dresser, MD  letrozole [facility]) 2.5 MG tablet Take 1 tablet (2.5 mg total) by mouth daily. 04/22/19  Yes Nicholas Lose, MD  memantine (NAMENDA) 10 MG tablet Take 1 tablet (10 mg total) by mouth 2 (two) times daily. 09/28/19  Yes Lauree Chandler, NP  saccharomyces boulardii (FLORASTOR) 250 MG capsule Take 1 capsule (250 mg total) by mouth 2 (two) times daily. 10/27/19 01/25/20 Yes Lauree Chandler, NP  spironolactone (ALDACTONE) 25 MG tablet TAKE 1/2 TABLET BY MOUTH EVERY DAY Patient taking differently: Take 12.5 mg by mouth daily.  09/29/19  Yes Larey Dresser, MD  B-D UF III MINI PEN NEEDLES 31G X 5 MM MISC 1 EACH BY DOES NOT APPLY ROUTE 2 (TWO) TIMES DAILY. USE TWICE DAILY WITH INSULIN PEN. DX: E11.9 09/28/19   Lauree Chandler, NP  Blood Glucose Monitoring Suppl (ONE TOUCH ULTRA 2) w/Device KIT Use to test blood sugar 2 times  daily DX: E11.9 03/11/19   Ngetich, Dinah C, NP  Continuous Blood Gluc Receiver (DEXCOM G6 RECEIVER) DEVI 1 Device by Does not apply route as directed. 04/14/19   Shamleffer, Melanie Crazier, MD  Continuous Blood Gluc Sensor (DEXCOM G6 SENSOR) MISC 1 Device by Does not  apply route as directed. 04/14/19   Shamleffer, Melanie Crazier, MD  Continuous Blood Gluc Transmit (DEXCOM G6 TRANSMITTER) MISC 1 Device by Does not apply route as directed. 04/14/19   Shamleffer, Melanie Crazier, MD  glucose blood (ONETOUCH VERIO) test strip One Touch Verio. Use to test blood sugar twice daily Dx: E11.9 04/30/19   Shamleffer, Melanie Crazier, MD  OneTouch Delica Lancets 37T MISC Use as instructed to test blood sugar 2 times daily DX: E11.9 03/11/19   Ngetich, Nelda Bucks, NP     Family History  Problem Relation Age of Onset  . Alzheimer's disease Mother 67  . Alzheimer's disease Father 71  . Congestive Heart Failure Father   . Alcoholism Brother   . Alzheimer's disease Brother   .  Dementia Brother   . Alcoholism Brother   . Diabetes Brother     Social History   Socioeconomic History  . Marital status: Married    Spouse name: Not on file  . Number of children: Not on file  . Years of education: Not on file  . Highest education level: Not on file  Occupational History  . Occupation: retired Building control surveyor  Tobacco Use  . Smoking status: Never Smoker  . Smokeless tobacco: Never Used  Substance and Sexual Activity  . Alcohol use: No  . Drug use: No  . Sexual activity: Not Currently  Other Topics Concern  . Not on file  Social History Narrative   Social History   Diet?    Do you drink/eat things with caffeine? Yes (minimal) coke   Marital status?          married                          What year were you married? 1968   Do you live in a house, apartment, assisted living, condo, trailer, etc.? house   Is it one or more stories? one   How many persons live in your home? 2   Do you have any pets in your home? (please list) none   Highest level of education completed?   Current or past profession: preschool teacher/nanny   Do you exercise?         0                             Type & how often?   Advanced Directives   Do you have a living will?   Do you have a DNR  form?                                  If not, do you want to discuss one?   Do you have signed POA/HPOA for forms?    Functional Status   Do you have difficulty bathing or dressing yourself?   Do you have difficulty preparing food or eating?    Do you have difficulty managing your medications?   Do you have difficulty managing your finances?   Do you have difficulty affording your medications?    Admitted to [place] 05/01/17   Never smoked   Alcohol none   DNR      Social Determinants of Health   Financial Resource Strain:   . Difficulty of Paying Living Expenses:   Food Insecurity:   . Worried About Charity fundraiser in the Last Year:   . Arboriculturist in the Last Year:   Transportation Needs:   . Film/video editor (Medical):   Marland Kitchen Lack of Transportation (Non-Medical):   Physical Activity:   . Days of Exercise per Week:   . Minutes of Exercise per Session:   Stress:   . Feeling of Stress :   Social Connections:   . Frequency of Communication with Friends and Family:   . Frequency of Social Gatherings with Friends and Family:   . Attends Religious Services:   . Active Member of Clubs or Organizations:   . Attends Archivist Meetings:   Marland Kitchen Marital Status:      Review of Systems: A 12 point ROS discussed and pertinent positives  are indicated in the HPI above.  All other systems are negative.  Review of Systems  Unable to perform ROS: Dementia    Vital Signs: BP (!) 155/78   Pulse 76   Temp 97.9 F (36.6 C) (Skin)   Resp 16   Ht _0  (1.676 m)   Wt 175 lb (79.4 kg)   LMP  (LMP Unknown)   SpO2 97%   BMI 28.25 kg/m   Physical Exam Vitals and nursing note reviewed.  Constitutional:      General: She is not in acute distress.    Appearance: Normal appearance. She is not ill-appearing.  HENT:     Mouth/Throat:     Mouth: Mucous membranes are moist.     Pharynx: Oropharynx is clear.  Cardiovascular:     Rate and Rhythm: Normal  rate and regular rhythm.     Heart sounds: Murmur present.  Pulmonary:     Effort: Pulmonary effort is normal. No respiratory distress.     Breath sounds: Normal breath sounds.  Skin:    General: Skin is warm and dry.  Neurological:     General: No focal deficit present.     Mental Status: She is alert. Mental status is at baseline. She is disoriented.  Psychiatric:        Mood and Affect: Mood normal.        Behavior: Behavior normal.        Thought Content: Thought content normal.        Judgment: Judgment normal.      MD Evaluation Airway: WNL Heart: WNL Abdomen: WNL Chest/ Lungs: WNL ASA  Classification: 3 Mallampati/Airway Score: Two   Imaging: DG Chest 2 View  Result Date: 10/21/2019 CLINICAL DATA:  Altered mental status EXAM: CHEST - 2 VIEW COMPARISON:  10/09/2017 FINDINGS: Interval removal of a right-sided Port-A-Cath. Stable mild cardiomegaly. Aortic calcified and tortuous. Masslike 3.3 x 3.2 cm opacity abutting the pleural surface of the inferolateral aspect of the right upper lobe, new from prior. Left lung is clear. No pleural effusion or pneumothorax. IMPRESSION: 3.3 cm masslike opacity abutting the pleural surface of the inferolateral aspect of the right upper lobe, new from prior. Contrast-enhanced CT of the chest recommended for further evaluation. Electronically Signed   By: Davina Poke D.O.   On: 10/21/2019 13:07   CT Head Wo Contrast  Result Date: 10/21/2019 CLINICAL DATA:  Altered mental status. Difficulty walking. EXAM: CT HEAD WITHOUT CONTRAST TECHNIQUE: Contiguous axial images were obtained from the base of the skull through the vertex without intravenous contrast. COMPARISON:  CT scan dated 04/29/2017 FINDINGS: Brain: No evidence of acute infarction, hemorrhage, hydrocephalus, extra-axial collection or mass lesion/mass effect. Diffuse cerebral cortical atrophy with secondary ventricular dilatation. The atrophy is most severe in the temporal lobes. Atrophy  has slightly progressed since the prior study. Extensive chronic periventricular white matter lucency with a few old lacunar infarcts in the left basal ganglia. Vascular: No hyperdense vessel or unexpected calcification. Skull: Normal. Negative for fracture or focal lesion. Sinuses/Orbits: Normal. Other: None IMPRESSION: No acute abnormalities. Progressive atrophy. Extensive chronic small vessel ischemic changes. Electronically Signed   By: Lorriane Shire M.D.   On: 10/21/2019 15:11   CT Chest W Contrast  Result Date: 10/21/2019 CLINICAL DATA:  Weakness. Patient received a COVID vaccine yesterday. Right upper lobe mass on conventional radiographs. EXAM: CT CHEST WITH CONTRAST TECHNIQUE: Multidetector CT imaging of the chest was performed during intravenous contrast administration. CONTRAST:  81m OMNIPAQUE IOHEXOL  300 MG/ML  SOLN COMPARISON:  Chest radiograph 10/21/2019 CT chest from 07/22/2017 FINDINGS: Cardiovascular: Atherosclerotic calcification of the aortic arch left anterior descending and circumflex coronary arteries. Mediastinum/Nodes: Right hilar lymph node 2.4 cm in short axis on image 55/2, displacing adjacent pulmonary arterial and pulmonary venous structures. Adjacent right lower paratracheal adenopathy including a node extending anterior to the carina measuring 1.6 cm in short axis on image 53/2, and an adjacent paratracheal node measuring 1.9 cm short axis on image 51/2. All 3 of these lymph nodes are tangential to each other without intact fat planes in between. However, I do not observe over contralateral thoracic adenopathy. Small type 1 hiatal hernia. Lungs/Pleura: A 4.3 by 3.1 by 3.0 cm solid-appearing mass of the right upper lobe abuts the major fissure, minor fissure, and lateral pleural margin, without compelling findings of adjacent rib destruction or chest wall invasion. There is some mild lobularity along the minor fissure margin of the mass for example on image 18/5, but without  definite trans fissural extension. 2 by 3 mm mm right lower lobe subpleural nodule is likely a benign subpleural lymph node on image 82/7, but not entirely specific in this setting. Post radiation therapy related findings anteriorly in the left lung. Upper Abdomen: A 2.2 by 1.7 cm right adrenal mass is present on image 131/2, stable from 07/22/2017. A 1.0 by 1.7 cm stable nodules present in the left adrenal gland (image 127/2). Because these nodules were present in 2018 but the lung mass was not, these are likely benign lesions such as adenomas. Musculoskeletal: The mass is adjacent to the right fifth and sixth ribs, but no rib involvement or rib destruction is currently identified. Thoracic kyphosis is present along with levoconvex lower thoracic scoliosis. Thoracic spondylosis noted. Bilateral mastectomies noted. IMPRESSION: 1. 4.3 cm solid right upper lobe mass abutting the major fissure, minor fissure, and lateral pleural margin, without chest wall invasion. There is associated right hilar and paratracheal adenopathy including a lymph node abutting the carina, but no contralateral adenopathy. Assuming non-small cell lung cancer this likely represents T2b N2 M0 disease (stage IIIA). 2. Bilateral adrenal masses are stable from, but the lung mass was not present-this indicates that the adrenal masses are likely benign. 3.  Aortic Atherosclerosis (ICD10-I70.0).  Coronary atherosclerosis. Electronically Signed   By: Van Clines M.D.   On: 10/21/2019 15:38   MR BRAIN WO CONTRAST  Result Date: 10/23/2019 CLINICAL DATA:  Neuro deficit, subacute. Additional provided: Weakness post second COVID vaccine 10/20/2019, patient reports right leg pain, history of dementia, history of breast cancer EXAM: MRI HEAD WITHOUT CONTRAST TECHNIQUE: Multiplanar, multiecho pulse sequences of the brain and surrounding structures were obtained without intravenous contrast. COMPARISON:  Noncontrast head CT 10/21/2019, brain MRI  08/14/2017. FINDINGS: Brain: Mild intermittent motion degradation. Please note there is limited evaluation for intracranial metastatic disease on this noncontrast examination. There is no evidence of acute infarct. No evidence of intracranial mass. No midline shift or extra-axial fluid collection. Again demonstrated is advanced patchy and confluent T2/FLAIR hyperintensity within the cerebral white matter which is nonspecific, but consistent with chronic small vessel ischemic disease. As before, there are multiple chronic lacunar infarcts in the bilateral basal ganglia, thalami and within the cerebellar hemispheres. Chronic lacunar infarcts appear more numerous within the right cerebellar hemisphere as compared to prior MRI 08/14/2017. Several chronic microhemorrhages within the right temporal occipital and left parietal lobes are unchanged. Moderate generalized parenchymal atrophy. Vascular: Flow voids maintained within the proximal large arterial vessels.  Skull and upper cervical spine: No focal marrow lesion. Sinuses/Orbits: Visualized orbits demonstrate no acute abnormality. Minimal ethmoid sinus mucosal thickening. No significant mastoid effusion. IMPRESSION: 1. Evaluation for intracranial metastatic disease is limited on this non-contrast examination. 2. No evidence of acute intracranial abnormality, including acute infarction. 3. Advanced chronic small vessel ischemic disease with multiple chronic lacunar infarcts. Chronic lacunar infarcts within the right cerebellum appear more numerous as compared to prior MRI 08/14/2017. 4. Moderate generalized parenchymal atrophy. Electronically Signed   By: Kellie Simmering DO   On: 10/23/2019 14:14    Labs:  CBC: Recent Labs    10/21/19 1319 10/22/19 0435 10/23/19 0743 11/03/19 0953  WBC 5.1 3.5* 4.0 6.2  HGB 13.5 12.8 13.8 13.5  HCT 40.2 38.3 41.5 40.7  PLT 147* 139* 168 222    COAGS: Recent Labs    11/03/19 0953  INR 1.1    BMP: Recent Labs     08/05/19 1215 10/21/19 1302 10/22/19 0435 10/23/19 0743  NA 143 139 140 139  K 3.9 4.5 4.0 4.0  CL 110 107 107 106  CO2 _0 GLUCOSE 245* 126* 129* 171*  BUN _1 CALCIUM 8.8* 8.9 8.8* 8.8*  CREATININE 0.85 0.77 0.67 0.76  GFRNONAA >60 >60 >60 >60  GFRAA >60 >60 >60 >60    LIVER FUNCTION TESTS: Recent Labs    03/08/19 0933 06/14/19 1210 10/21/19 1302  BILITOT 0.6 0.4 0.9  AST _2 ALT _3 ALKPHOS  --   --  78  PROT 6.5 6.2 6.8  ALBUMIN  --   --  3.6    TUMOR MARKERS: No results for input(s): AFPTM, CEA, CA199, CHROMGRNA in the last 8760 hours.  Assessment and Plan: Patient with past medical history of breast cancer, congestive heart failure presents with complaint of right-sided lung mass.  IR consulted for lung mass biopsy at the request of [name]. Case reviewed by Dr. Earleen Newport who approves patient for procedure.  Patient presents today in their usual state of health.  She has been NPO and is not currently on blood thinners as she appropriately held her Eliquis this week.   Risks and benefits discussed with the patient and/or patient's family including, but not limited to bleeding, infection, damage to adjacent structures or low yield requiring additional tests.  All of the questions were answered and there is agreement to proceed.  Consent signed and in chart.  Thank you for this interesting consult.  I greatly enjoyed meeting Charlene Rubio and look forward to participating in their care.  A copy of this report was sent to the requesting provider on this date.  Electronically Signed: Docia Barrier, PA 11/03/2019, 11:19 AM   I spent a total of  30 Minutes   in face to face in clinical consultation, greater than 50% of which was counseling/coordinating care for right lung mass.

## 2019-11-03 NOTE — Progress Notes (Signed)
Discharge instructions reviewed with pt and her husband. Husband voices understanding.

## 2019-11-03 NOTE — Discharge Instructions (Signed)
Moderate Conscious Sedation, Adult, Care After These instructions provide you with information about caring for yourself after your procedure. Your health care provider may also give you more specific instructions. Your treatment has been planned according to current medical practices, but problems sometimes occur. Call your health care provider if you have any problems or questions after your procedure. What can I expect after the procedure? After your procedure, it is common:  To feel sleepy for several hours.  To feel clumsy and have poor balance for several hours.  To have poor judgment for several hours.  To vomit if you eat too soon. Follow these instructions at home: For at least 24 hours after the procedure:   Do not: ? Participate in activities where you could fall or become injured. ? Drive. ? Use heavy machinery. ? Drink alcohol. ? Take sleeping pills or medicines that cause drowsiness. ? Make important decisions or sign legal documents. ? Take care of children on your own.  Rest. Eating and drinking  Follow the diet recommended by your health care provider.  If you vomit: ? Drink water, juice, or soup when you can drink without vomiting. ? Make sure you have little or no nausea before eating solid foods. General instructions  Have a responsible adult stay with you until you are awake and alert.  Take over-the-counter and prescription medicines only as told by your health care provider.  If you smoke, do not smoke without supervision.  Keep all follow-up visits as told by your health care provider. This is important. Contact a health care provider if:  You keep feeling nauseous or you keep vomiting.  You feel light-headed.  You develop a rash.  You have a fever. Get help right away if:  You have trouble breathing. This information is not intended to replace advice given to you by your health care provider. Make sure you discuss any questions you have  with your health care provider. Document Revised: 07/18/2017 Document Reviewed: 11/25/2015 Elsevier Patient Education  [place] After This sheet gives you information about how to care for yourself after your procedure. Your health care provider may also give you more specific instructions depending on the type of biopsy you had. If you have problems or questions, contact your health care provider. What can I expect after the procedure? After the procedure, it is common to have:  A cough.  A sore throat.  Pain where a needle, bronchoscope, or incision was used to collect a biopsy sample (biopsy site). Follow these instructions at home: Medicines  Take over-the-counter and prescription medicines only as told by your health care provider.  Do not drink alcohol if your health care provider tells you not to drink.  Ask your health care provider if the medicine prescribed to you: ? Requires you to avoid driving or using heavy machinery. ? Can cause constipation. You may need to take these actions to prevent or treat constipation:  Drink enough fluid to keep your urine pale yellow.  Take over-the-counter or prescription medicines.  Eat foods that are high in fiber, such as beans, whole grains, and fresh fruits and vegetables.  Limit foods that are high in fat and processed sugars, such as fried or sweet foods.  Do not drive for 24 hours if you were given a sedative. Biopsy site care   Follow instructions from your health care provider about how to take care of your biopsy site. Make sure you: ? Wash your hands  with soap and water before and after you change your bandage (dressing). If soap and water are not available, use hand sanitizer. ? Change your dressing as told by your health care provider. ? Leave stitches (sutures), skin glue, or adhesive strips in place. These skin closures may need to stay in place for 2 weeks or longer. If adhesive strip edges  start to loosen and curl up, you may trim the loose edges. Do not remove adhesive strips completely unless your health care provider tells you to do that.  Do not take baths, swim, or use a hot tub until your health care provider approves. Ask your health care provider if you may take showers. You may only be allowed to take sponge baths.  Check your biopsy site every day for signs of infection. Check for: ? Redness, swelling, or more pain. ? Fluid or blood. ? Warmth. ? Pus or a bad smell. General instructions  Return to your normal activities as told by your health care provider. Ask your health care provider what activities are safe for you.  It is up to you to get the results of your procedure. Ask your health care provider, or the department that is doing the procedure, when your results will be ready.  Keep all follow-up visits as told by your health care provider. This is important. Contact a health care provider if:  You have a fever.  You have redness, swelling, or more pain around your biopsy site.  You have fluid or blood coming from your biopsy site.  Your biopsy site feels warm to the touch.  You have pus or a bad smell coming from your biopsy site.  You have pain that does not get better with medicine. Get help right away if:  You cough up blood.  You have trouble breathing.  You have chest pain.  You lose consciousness. Summary  After the procedure, it is common to have a sore throat and a cough.  Return to your normal activities as told by your health care provider. Ask your health care provider what activities are safe for you.  Take over-the-counter and prescription medicines only as told by your health care provider.  Report any unusual symptoms to your health care provider. This information is not intended to replace advice given to you by your health care provider. Make sure you discuss any questions you have with your health care provider. Document  Revised: 09/09/2018 Document Reviewed: 09/03/2016 Elsevier Patient Education  [place].

## 2019-11-03 NOTE — Procedures (Signed)
Interventional Radiology Procedure:   Indications: Right lung mass  Procedure: CT guided right lung biopsy  Findings: 2 cores from pleural based mass.  Complications: none     EBL: less than 10 ml  Plan: Keep NPO until after CXR.   Charlene Akhtar R. Anselm Pancoast, MD  Pager: [phone]

## 2019-11-05 ENCOUNTER — Telehealth: Payer: Self-pay | Admitting: *Deleted

## 2019-11-05 NOTE — Telephone Encounter (Signed)
Left message on secure VM with results

## 2019-11-05 NOTE — Telephone Encounter (Signed)
Okay to reschedule evaluation

## 2019-11-05 NOTE — Telephone Encounter (Signed)
[facility] OT Calling because pt's husband would like to cancel the evaluation due to dr's appt.[facility] just needs a ok for the evaluation to be scheduled.

## 2019-11-08 NOTE — Progress Notes (Signed)
Patient Care Team: Lauree Chandler, NP as PCP - General (Geriatric Medicine) Larey Dresser, MD as PCP - Advanced Heart Failure (Cardiology) Nicholas Lose, MD as Consulting Physician (Hematology and Oncology) Eppie Gibson, MD as Attending Physician (Radiation Oncology) Fanny Skates, MD as Consulting Physician (General Surgery) Nicholas Lose, MD as Consulting Physician (Hematology and Oncology)  DIAGNOSIS:    ICD-10-CM   1. Lung mass  R91.8 NM PET Image Initial (PI) Skull Base To Thigh  2. 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, estrogen receptor positive ([name])  C50.811 NM PET Image Initial (PI) Skull Base To Thigh   C50.812    Z17.0     SUMMARY OF ONCOLOGIC HISTORY: Oncology History  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, estrogen receptor positive ([name])  06/23/2017 Initial Diagnosis   Left breast 11 o'clock position 6.3 x 4.2 x 6.1 cm mass with thick papillary projections, 2 adjacent left axillary lymph nodes; right breast 930 position 1.7 cm mass; left breast and left lymph node biopsy revealed IDC grade 3, ER 5%, PR 0%, Ki-67 50%, HER-2 positive ratio 2.04 T3N1 stage IIIa; right breast: Grade 1 IDC  ER 95%, PR 95%, Ki-67 15%, HER-2 negative; T1CN0 stage I a   08/27/2017 Surgery   Right simple mastectomy: IDC grade 1, 1.1 cm, DCIS low-grade, margins negative, 0/2 lymph nodes negative; ER 95%, PR 95%, HER-2 negative Ki-67 15% T1 cN0 stage I a left simple mastectomy: IDC grade 3, 6.2 cm, 4/13 lymph nodes positive, margins negative, ER 5 %, PR 0%, HER-2 positive ratio 2.04, Ki-67 50 % T3 N2 stage IIIa   10/14/2017 - 09/2018 Chemotherapy   Adjuvant Herceptin alone for 1 year   09/2017 -  Anti-estrogen oral therapy   Letrozole daily   10/23/2017 - 11/25/2017 Radiation Therapy   Adjuvant radiation therapy   12/05/2017 Genetic Testing   The Common Hereditary Cancer Panel offered by [facility] includes sequencing and/or deletion  duplication testing of the following 47 genes: APC, ATM, AXIN2, BARD1, BMPR1A, BRCA1, BRCA2, BRIP1, CDH1, CDKN2A (p14ARF), CDKN2A (p16INK4a), CKD4, CHEK2, CTNNA1, DICER1, EPCAM (Deletion/duplication testing only), GREM1 (promoter region deletion/duplication testing only), KIT, MEN1, MLH1, MSH2, MSH3, MSH6, MUTYH, NBN, NF1, NHTL1, PALB2, PDGFRA, PMS2, POLD1, POLE, PTEN, RAD50, RAD51C, RAD51D, SDHB, SDHC, SDHD, SMAD4, SMARCA4. STK11, TP53, TSC1, TSC2, and VHL.  The following genes were evaluated for sequence changes only: SDHA and HOXB13 c.251G>A variant only.  Results: Negative, no pathogenic variants identified.  The date of this test report is 12/05/2017.    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  CK7, GA TA 3, CK 5 and 6+, immunostains negative for ER/PR.  Ki-67 60%, HER-2 equivocal by IHC FISH pending     CHIEF COMPLIANT: Follow-up of lung biopsy  INTERVAL HISTORY: Charlene Rubio is a 74 y.o. with above-mentioned history of left breast cancer who underwent bilateral mastectomies, radiation, maintenance Herceptin, and is currently on anti-estrogen therapy with letrozole. She underwent a biopsy of a lung mass on 11/03/19 for which pathology showed non-small cell carcinoma, high grade, ER/PR negative, Ki67 60%, HER-2 equivocal, consistent with history of mammary carcinoma. She presents to the clinic today to discuss the pathology report.  She has a mild cough and is pleasantly demented.  She denies any chest pain or shortness of breath.  ALLERGIES:  is allergic to chlorhexidine.  MEDICATIONS:  Current Outpatient Medications  Medication Sig Dispense Refill  . B-D UF III MINI PEN NEEDLES 31G X 5 MM MISC 1 EACH BY DOES NOT APPLY ROUTE 2 (TWO) TIMES DAILY. USE TWICE DAILY WITH INSULIN PEN. DX: E11.9 100 each 11  . Blood Glucose Monitoring Suppl (ONE TOUCH ULTRA 2) w/Device KIT Use to test  blood sugar 2 times  daily DX: E11.9 1 kit 0  . carvedilol (COREG) 6.25 MG tablet Take 1 tablet (6.25 mg total) by mouth 2 (two) times daily. 60 tablet 0  . Continuous Blood Gluc Receiver (DEXCOM G6 RECEIVER) DEVI 1 Device by Does not apply route as directed. 1 Device 0  . Continuous Blood Gluc Sensor (DEXCOM G6 SENSOR) MISC 1 Device by Does not apply route as directed. 3 each 6  . Continuous Blood Gluc Transmit (DEXCOM G6 TRANSMITTER) MISC 1 Device by Does not apply route as directed. 1 each 3  . donepezil (ARICEPT) 10 MG tablet TAKE 1 TABLET BY MOUTH AT  BEDTIME 90 tablet 1  . ELIQUIS 5 MG TABS tablet TAKE 1 TABLET BY MOUTH TWICE A DAY (Patient taking differently: Take 5 mg by mouth 2 (two) times daily. ) 60 tablet 5  . ENTRESTO 24-26 MG TAKE 1 TABLET BY MOUTH TWICE A DAY (Patient taking differently: Take 1 tablet by mouth in the morning and at bedtime. ) 60 tablet 5  . furosemide (LASIX) 80 MG tablet TAKE 1 TABLET BY MOUTH  DAILY 90 tablet 3  . glucose blood (ONETOUCH VERIO) test strip One Touch Verio. Use to test blood sugar twice daily Dx: E11.9 100 each 3  . Insulin Lispro Prot & Lispro (HUMALOG MIX 75/25 KWIKPEN) (75-25) 100 UNIT/ML Kwikpen Inject 12 Units into the skin 2 (two) times daily with a meal. 15 mL 11  . KLOR-CON M20 20 MEQ tablet TAKE 1 TABLET BY MOUTH EVERY DAY (Patient taking differently: Take 20 mEq by mouth daily. ) 90 tablet 1  . letrozole (FEMARA) 2.5 MG tablet Take 1 tablet (2.5 mg total) by mouth daily. 90 tablet 3  . memantine (NAMENDA) 10 MG tablet Take 1 tablet (10 mg total) by mouth 2 (two) times daily. 180 tablet 1  . OneTouch Delica Lancets 38G MISC Use as instructed to test blood sugar 2 times daily DX: E11.9 100 each 0  . saccharomyces boulardii (FLORASTOR) 250 MG capsule Take 1 capsule (250 mg total) by mouth 2 (two) times daily. 60 capsule 2  . spironolactone (ALDACTONE) 25 MG tablet TAKE 1/2 TABLET BY MOUTH EVERY DAY (Patient taking differently: Take 12.5 mg by  mouth daily. ) 45 tablet 7   No current facility-administered medications for this visit.    PHYSICAL EXAMINATION: ECOG PERFORMANCE STATUS: 3 - Symptomatic, >50% confined to bed  Vitals:   11/09/19 0934  BP: (!) 140/54  Pulse: 71  Resp: 17  Temp: 97.8 F (36.6 C)  SpO2: 99%   Filed Weights   11/09/19 0934  Weight: 179 lb 1.6 oz (81.2 kg)    LABORATORY DATA:  I have reviewed the data as listed CMP Latest Ref Rng & Units 10/23/2019 10/22/2019 10/21/2019  Glucose 70 - 99 mg/dL 171(H) 129(H) 126(H)  BUN 8 - 23 mg/dL '20 18 21  ' Creatinine 0.44 - 1.00 mg/dL 0.76 0.67 0.77  Sodium 135 - 145 mmol/L 139 140 139  Potassium 3.5 - 5.1 mmol/L 4.0 4.0 4.5  Chloride 98 - 111 mmol/L 106 107 107  CO2 22 - 32 mmol/L '24 24 24  ' Calcium 8.9 -  10.3 mg/dL 8.8(L) 8.8(L) 8.9  Total Protein 6.5 - 8.1 g/dL - - 6.8  Total Bilirubin 0.3 - 1.2 mg/dL - - 0.9  Alkaline Phos 38 - 126 U/L - - 78  AST 15 - 41 U/L - - 18  ALT 0 - 44 U/L - - 12    Lab Results  Component Value Date   WBC 6.2 11/03/2019   HGB 13.5 11/03/2019   HCT 40.7 11/03/2019   MCV 97.6 11/03/2019   PLT 222 11/03/2019   NEUTROABS 2.8 10/23/2019    ASSESSMENT & PLAN:  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 Adjuvant Herceptinstarted 10/14/17 completed 09/14/2018 2. Adjuvant radiation therapy 10/23/17-11/25/17 3. adjuvant antiestrogen therapystarted February 2019 --------------------------------------------------  Hospitalization: 10/21/2019-10/24/2019: Admitted with sudden onset of lower extremity weakness,  incidentally found to have lung mass CT chest 10/21/2019: 4.3 cm solid right upper lobe mass abutting the major fissure, minor fissure and lateral pleural margin without chest wall invasion.  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  CK7, GA TA 3, CK 5 and 6+, immunostains negative for ER/PR.  Ki-67 60%, HER-2 equivocal by IHC FISH pending  Counseling: I discussed with the patient that the pathology suggests metastatic breast cancer however it is estrogen receptor negative.  Treatment plan:  1.  We will send the tissue for Caris molecular testing to determine if she would benefit from immunotherapy. 2.  I do not believe the patient is a good candidate for systemic chemotherapy. 3.  We can refer her for palliative radiation therapy if her symptoms in the lungs get worse. 4.  Palliative care and hospice are also options to consider.  Our plan is to perform Caris molecular testing and a PET CT scan and bring her back in 2 weeks to discuss her options.  In the meantime we will also have the HER-2 FISH test result.  Based on all of these factors we may consider treatment options or hospice care.   Orders Placed This Encounter  Procedures  . NM PET Image Initial (PI) Skull Base To Thigh    Standing Status:   Future    Standing Expiration Date:   11/08/2020    Order Specific Question:   ** REASON FOR EXAM (FREE TEXT)    Answer:   Lung mass and enlarged lymph nodes    Order Specific Question:   If indicated for the ordered procedure, I authorize the administration of a radiopharmaceutical per Radiology protocol    Answer:   Yes    Order Specific Question:   Preferred imaging location?    Answer:   [facility]    Order Specific Question:   Release to patient    Answer:   Immediate    Order Specific  Question:   Radiology Contrast Protocol - do NOT remove file path    Answer:   [email or web address]   The patient has a good understanding of the overall  plan. she agrees with it. she will call with any problems that may develop before the next visit here.  Total time spent: 30 mins including face to face time and time spent for planning, charting and coordination of care  Nicholas Lose, MD 11/09/2019  I, Cloyde Reams Dorshimer, am acting as scribe for Dr. Nicholas Lose.  I have reviewed the above documentation for accuracy and completeness, and I agree with the above.

## 2019-11-09 ENCOUNTER — Inpatient Hospital Stay: Payer: Medicare Other | Attending: Hematology and Oncology | Admitting: Hematology and Oncology

## 2019-11-09 ENCOUNTER — Other Ambulatory Visit: Payer: Self-pay

## 2019-11-09 VITALS — BP 140/54 | HR 71 | Temp 97.8°F | Resp 17 | Ht 66.0 in | Wt 179.1 lb

## 2019-11-09 DIAGNOSIS — R599 Enlarged lymph nodes, unspecified: Secondary | ICD-10-CM | POA: Diagnosis not present

## 2019-11-09 DIAGNOSIS — Z923 Personal history of irradiation: Secondary | ICD-10-CM | POA: Diagnosis not present

## 2019-11-09 DIAGNOSIS — C50812 Malignant neoplasm of overlapping sites of left female breast: Secondary | ICD-10-CM | POA: Insufficient documentation

## 2019-11-09 DIAGNOSIS — Z9013 Acquired absence of bilateral breasts and nipples: Secondary | ICD-10-CM | POA: Diagnosis not present

## 2019-11-09 DIAGNOSIS — F0390 Unspecified dementia without behavioral disturbance: Secondary | ICD-10-CM | POA: Diagnosis not present

## 2019-11-09 DIAGNOSIS — C3411 Malignant neoplasm of upper lobe, right bronchus or lung: Secondary | ICD-10-CM | POA: Diagnosis not present

## 2019-11-09 DIAGNOSIS — R918 Other nonspecific abnormal finding of lung field: Secondary | ICD-10-CM | POA: Diagnosis not present

## 2019-11-09 DIAGNOSIS — R05 Cough: Secondary | ICD-10-CM | POA: Diagnosis not present

## 2019-11-09 DIAGNOSIS — E279 Disorder of adrenal gland, unspecified: Secondary | ICD-10-CM | POA: Diagnosis not present

## 2019-11-09 DIAGNOSIS — C50811 Malignant neoplasm of overlapping sites of right female breast: Secondary | ICD-10-CM | POA: Diagnosis not present

## 2019-11-09 DIAGNOSIS — Z170 Estrogen receptor positive status [ER+]: Secondary | ICD-10-CM

## 2019-11-09 LAB — SURGICAL PATHOLOGY

## 2019-11-09 NOTE — Assessment & Plan Note (Signed)
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 Adjuvant Herceptinstarted 10/14/17 completed 09/14/2018 2. Adjuvant radiation therapy 10/23/17-11/25/17 3. adjuvant antiestrogen therapystarted February 2019 --------------------------------------------------  Hospitalization: 10/21/2019-10/24/2019: Admitted with sudden onset of lower extremity weakness, incidentally found to have lung mass CT chest 10/21/2019: 4.3 cm solid right upper lobe mass abutting the major fissure, minor fissure and lateral pleural margin without chest wall invasion.  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  CK7, GA TA 3, CK 5 and 6+, immunostains negative for ER/PR.  Ki-67 60%, HER-2 equivocal by IHC FISH pending  Counseling: I discussed with the patient that the pathology suggests metastatic breast cancer however it is estrogen receptor negative.  Treatment plan:  1.  We will send the tissue for Caris molecular testing to determine if she would benefit from immunotherapy. 2.  I do not believe the patient is a good candidate for systemic chemotherapy. 3.  We can refer her for palliative radiation therapy if her symptoms in the lungs get worse. 4.  Palliative care and hospice are also options to consider.

## 2019-11-09 NOTE — Progress Notes (Signed)
Caris request successfully faxed to [phone].

## 2019-11-10 ENCOUNTER — Telehealth: Payer: Self-pay | Admitting: Hematology and Oncology

## 2019-11-10 NOTE — Telephone Encounter (Signed)
Scheduled per 03/23 los, patient has been called and voicemail was left. 

## 2019-11-11 ENCOUNTER — Encounter: Payer: Self-pay | Admitting: *Deleted

## 2019-11-22 ENCOUNTER — Encounter (HOSPITAL_COMMUNITY)
Admission: RE | Admit: 2019-11-22 | Discharge: 2019-11-22 | Disposition: A | Discharge status: Home / Self Care | Payer: Medicare Other | Source: Ambulatory Visit | Attending: Hematology and Oncology | Admitting: Hematology and Oncology

## 2019-11-22 ENCOUNTER — Other Ambulatory Visit: Payer: Self-pay

## 2019-11-22 DIAGNOSIS — Z170 Estrogen receptor positive status [ER+]: Secondary | ICD-10-CM | POA: Diagnosis not present

## 2019-11-22 DIAGNOSIS — C7951 Secondary malignant neoplasm of bone: Secondary | ICD-10-CM | POA: Diagnosis not present

## 2019-11-22 DIAGNOSIS — C787 Secondary malignant neoplasm of liver and intrahepatic bile duct: Secondary | ICD-10-CM | POA: Insufficient documentation

## 2019-11-22 DIAGNOSIS — I2510 Atherosclerotic heart disease of native coronary artery without angina pectoris: Secondary | ICD-10-CM | POA: Diagnosis not present

## 2019-11-22 DIAGNOSIS — I700 Atherosclerosis of aorta: Secondary | ICD-10-CM | POA: Diagnosis not present

## 2019-11-22 DIAGNOSIS — D3501 Benign neoplasm of right adrenal gland: Secondary | ICD-10-CM | POA: Diagnosis not present

## 2019-11-22 DIAGNOSIS — R918 Other nonspecific abnormal finding of lung field: Secondary | ICD-10-CM

## 2019-11-22 DIAGNOSIS — C50812 Malignant neoplasm of overlapping sites of left female breast: Secondary | ICD-10-CM | POA: Diagnosis not present

## 2019-11-22 DIAGNOSIS — D3502 Benign neoplasm of left adrenal gland: Secondary | ICD-10-CM | POA: Insufficient documentation

## 2019-11-22 DIAGNOSIS — C50811 Malignant neoplasm of overlapping sites of right female breast: Secondary | ICD-10-CM | POA: Insufficient documentation

## 2019-11-22 DIAGNOSIS — C7801 Secondary malignant neoplasm of right lung: Secondary | ICD-10-CM | POA: Diagnosis not present

## 2019-11-22 DIAGNOSIS — C50919 Malignant neoplasm of unspecified site of unspecified female breast: Secondary | ICD-10-CM | POA: Diagnosis not present

## 2019-11-22 LAB — GLUCOSE, CAPILLARY: Glucose-Capillary: 128 mg/dL — ABNORMAL HIGH (ref 70–99)

## 2019-11-22 MED ORDER — FLUDEOXYGLUCOSE F - 18 (FDG) INJECTION
8.9700 | Freq: Once | INTRAVENOUS | Status: AC | PRN
  Administered 2019-11-22: 11:00:00 8.97 via INTRAVENOUS

## 2019-11-23 ENCOUNTER — Ambulatory Visit: Payer: Medicare Other | Admitting: Hematology and Oncology

## 2019-11-23 DIAGNOSIS — M6281 Muscle weakness (generalized): Secondary | ICD-10-CM | POA: Diagnosis not present

## 2019-11-24 ENCOUNTER — Other Ambulatory Visit: Payer: Self-pay

## 2019-11-24 ENCOUNTER — Telehealth: Payer: Self-pay

## 2019-11-24 ENCOUNTER — Encounter: Payer: Self-pay | Admitting: Nurse Practitioner

## 2019-11-24 ENCOUNTER — Telehealth (INDEPENDENT_AMBULATORY_CARE_PROVIDER_SITE_OTHER): Payer: Medicare Other | Admitting: Nurse Practitioner

## 2019-11-24 DIAGNOSIS — R918 Other nonspecific abnormal finding of lung field: Secondary | ICD-10-CM

## 2019-11-24 DIAGNOSIS — Z7189 Other specified counseling: Secondary | ICD-10-CM

## 2019-11-24 DIAGNOSIS — F0390 Unspecified dementia without behavioral disturbance: Secondary | ICD-10-CM | POA: Diagnosis not present

## 2019-11-24 NOTE — Telephone Encounter (Signed)
Ms. Charlene Rubio, Charlene Rubio are scheduled for a virtual visit with your provider today.    Just as we do with appointments in the office, we must obtain your consent to participate.  Your consent will be active for this visit and any virtual visit you may have with one of our providers in the next 365 days.    If you have a MyChart account, I can also send a copy of this consent to you electronically.  All virtual visits are billed to your insurance company just like a traditional visit in the office.  As this is a virtual visit, video technology does not allow for your provider to perform a traditional examination.  This may limit your provider's ability to fully assess your condition.  If your provider identifies any concerns that need to be evaluated in person or the need to arrange testing such as labs, EKG, etc, we will make arrangements to do so.    Although advances in technology are sophisticated, we cannot ensure that it will always work on either your end or our end.  If the connection with a video visit is poor, we may have to switch to a telephone visit.  With either a video or telephone visit, we are not always able to ensure that we have a secure connection.   I need to obtain your verbal consent now.   Are you willing to proceed with your visit today?   Charlene Rubio has provided verbal consent on 11/24/2019 for a virtual visit (video or telephone).   Leigh [place], [place] 11/24/2019  11:16 AM

## 2019-11-24 NOTE — Progress Notes (Signed)
This service is provided via telemedicine  No vital signs collected/recorded due to the encounter was a telemedicine visit.   Location of patient (ex: home, work):  Home  Patient consents to a telephone visit:  Yes, see telephone encounter with annual consent   Location of the provider (ex: office, home):  [facility], Office   Name of any referring provider:  N/A  Names of all persons participating in the telemedicine service and their role in the encounter:  S.Chrae B/CMA, Sherrie Mustache, NP, Annabell Howells) and Patient   Time spent on call:  7 min with medical assistant       Careteam: Patient Care Team: Lauree Chandler, NP as PCP - General (Geriatric Medicine) Larey Dresser, MD as PCP - Advanced Heart Failure (Cardiology) Nicholas Lose, MD as Consulting Physician (Hematology and Oncology) Eppie Gibson, MD as Attending Physician (Radiation Oncology) Fanny Skates, MD as Consulting Physician (General Surgery) Nicholas Lose, MD as Consulting Physician (Hematology and Oncology)  PLACE OF SERVICE:  [place] Directive information Does Patient Have a Medical Advance Directive?: Yes, Type of Advance Directive: Out of facility DNR (pink MOST or yellow form), Pre-existing out of facility DNR order (yellow form or pink MOST form): Yellow form placed in chart (order not valid for inpatient use), Does patient want to make changes to medical advance directive?: No - Patient declined  Allergies  Allergen Reactions  . Chlorhexidine     Criteria no longer met    Chief Complaint  Patient presents with  . Advanced Directive    Advance Care Planning- Discuss MOST form. Telehealth/video visit   . Fall Risk    Moderate      HPI: Patient is a 74 y.o. female via virtual visit for MOST form completion.  She has recently been diagnosised with lung cancer. Had a PET scan 2 days, has an appt next week to review. Will decide what the plan is after that  appt. Reported oncologist stated there was no chemo or surgery options at this time.  Reports she would want comfort measures due to her dementia. Would do a trail of IV and feeding tube if needed and agreeable to treat infections with antibiotics  Would like to limit office visit and wondering if she needs follow up in our office. Having trouble with walking and really would like virtual visit. Sleeping more. Reports everything is good.   Review of Systems:  Review of Systems  Unable to perform ROS: Dementia  Psychiatric/Behavioral: Positive for memory loss.    Past Medical History:  Diagnosis Date  . Acute diastolic (congestive) heart failure ([place]) 04/25/2017  . Acute respiratory failure with hypoxia and hypercapnia ([place]) 04/25/2017  . ALLERGIC RHINITIS 07/01/2007   Qualifier: Diagnosis of  By: Sherren Mocha MD, Jory Ee   . Allergy   . Aortic insufficiency   . Aortic valve disorder 07/01/2007   Qualifier: Diagnosis of  By: Sherren Mocha MD, Jory Ee   . Arthritis   . Bilateral leg edema 04/25/2017  . Borderline diabetic   . Cancer [facility])    Breast cancer  . CHF (congestive heart failure) ([place]) 04/25/2017  . Dementia ([place]) 04/30/2017  . Diabetes mellitus ([place]) 04/25/2017  . DOE (dyspnea on exertion) 04/25/2017  . Essential hypertension 07/01/2007   Qualifier: Diagnosis of  By: Sherren Mocha MD, Jory Ee Glaucoma suspect   . Hypertension   . KNEE PAIN, LEFT 04/06/2010   Qualifier: Diagnosis of  By: Sherren Mocha MD,  Jeffrey A   . Nonischemic cardiomyopathy ([place])   . Obesity   . Obesity, unspecified 07/01/2007   Qualifier: Diagnosis of  By: Sherren Mocha MD, Jory Ee   . Peripheral vascular disease ([place])    blood clots in legs  . Thyroid disease    Past Surgical History:  Procedure Laterality Date  . CESAREAN SECTION    . MASTECTOMY  08/27/2017   LEFT BREAST MODIFIED RADICAL MASTECTOMY (Left Breast)  . MASTECTOMY  08/27/2017   RIGHT TOTAL MASTECTOMY WITH SENTINEL LYMPH NODE BIOPSY (Right Breast)  . MASTECTOMY W/  SENTINEL NODE BIOPSY Right 08/27/2017   Procedure: RIGHT TOTAL MASTECTOMY WITH SENTINEL LYMPH NODE BIOPSY;  Surgeon: Fanny Skates, MD;  Location: [place];  Service: General;  Laterality: Right;  . MODIFIED MASTECTOMY Left 08/27/2017   Procedure: LEFT BREAST MODIFIED RADICAL MASTECTOMY;  Surgeon: Fanny Skates, MD;  Location: [place];  Service: General;  Laterality: Left;  . PORTACATH PLACEMENT Right 10/09/2017  . PORTACATH PLACEMENT Right 10/09/2017   Procedure: INSERTION PORT-A-CATH WITH ULTRASOUND;  Surgeon: Fanny Skates, MD;  Location: [place];  Service: General;  Laterality: Right;   Social History:   reports that she has never smoked. She has never used smokeless tobacco. She reports that she does not drink alcohol or use drugs.  Family History  Problem Relation Age of Onset  . Alzheimer's disease Mother 64  . Alzheimer's disease Father 28  . Congestive Heart Failure Father   . Alcoholism Brother   . Alzheimer's disease Brother   . Dementia Brother   . Alcoholism Brother   . Diabetes Brother     Medications: Patient's Medications  New Prescriptions   No medications on file  Previous Medications   B-D UF III MINI PEN NEEDLES 31G X 5 MM MISC    1 EACH BY DOES NOT APPLY ROUTE 2 (TWO) TIMES DAILY. USE TWICE DAILY WITH INSULIN PEN. DX: E11.9   BLOOD GLUCOSE MONITORING SUPPL (ONE TOUCH ULTRA 2) W/DEVICE KIT    Use to test blood sugar 2 times  daily DX: E11.9   CARVEDILOL (COREG) 6.25 MG TABLET    Take 1 tablet (6.25 mg total) by mouth 2 (two) times daily.   CONTINUOUS BLOOD GLUC RECEIVER (DEXCOM G6 RECEIVER) DEVI    1 Device by Does not apply route as directed.   CONTINUOUS BLOOD GLUC SENSOR (DEXCOM G6 SENSOR) MISC    1 Device by Does not apply route as directed.   CONTINUOUS BLOOD GLUC TRANSMIT (DEXCOM G6 TRANSMITTER) MISC    1 Device by Does not apply route as directed.   DONEPEZIL (ARICEPT) 10 MG TABLET    TAKE 1 TABLET BY MOUTH AT  BEDTIME   ELIQUIS 5 MG TABS TABLET    TAKE 1  TABLET BY MOUTH TWICE A DAY   ENTRESTO 24-26 MG    TAKE 1 TABLET BY MOUTH TWICE A DAY   FUROSEMIDE (LASIX) 80 MG TABLET    TAKE 1 TABLET BY MOUTH  DAILY   GLUCOSE BLOOD (ONETOUCH VERIO) TEST STRIP    One Touch Verio. Use to test blood sugar twice daily Dx: E11.9   INSULIN LISPRO PROT & LISPRO (HUMALOG MIX 75/25 KWIKPEN) (75-25) 100 UNIT/ML KWIKPEN    Inject 12 Units into the skin 2 (two) times daily with a meal.   KLOR-CON M20 20 MEQ TABLET    TAKE 1 TABLET BY MOUTH EVERY DAY   LETROZOLE (FEMARA) 2.5 MG TABLET    Take 1 tablet (2.5 mg total)  by mouth daily.   MEMANTINE (NAMENDA) 10 MG TABLET    Take 1 tablet (10 mg total) by mouth 2 (two) times daily.   ONETOUCH DELICA LANCETS 29M MISC    Use as instructed to test blood sugar 2 times daily DX: E11.9   SACCHAROMYCES BOULARDII (FLORASTOR) 250 MG CAPSULE    Take 1 capsule (250 mg total) by mouth 2 (two) times daily.   SPIRONOLACTONE (ALDACTONE) 25 MG TABLET    TAKE 1/2 TABLET BY MOUTH EVERY DAY  Modified Medications   No medications on file  Discontinued Medications   No medications on file    Physical Exam:  There were no vitals filed for this visit. There is no height or weight on file to calculate BMI. Wt Readings from Last 3 Encounters:  11/09/19 179 lb 1.6 oz (81.2 kg)  11/03/19 175 lb (79.4 kg)  10/23/19 183 lb 6.8 oz (83.2 kg)      Labs reviewed: Basic Metabolic Panel: Recent Labs    03/08/19 0933 06/14/19 1210 10/21/19 1302 10/21/19 1946 10/22/19 0435 10/23/19 0743  NA 137   < > 139  --  140 139  K 4.5   < > 4.5  --  4.0 4.0  CL 103   < > 107  --  107 106  CO2 24   < > 24  --  24 24  GLUCOSE 318*   < > 126*  --  129* 171*  BUN 18   < > 21  --  18 20  CREATININE 0.86   < > 0.77  --  0.67 0.76  CALCIUM 9.5   < > 8.9  --  8.8* 8.8*  MG  --   --   --   --  2.4  --   PHOS  --   --   --   --  3.9  --   TSH 1.86  --   --  1.234  --   --    < > = values in this interval not displayed.   Liver Function Tests: Recent  Labs    03/08/19 0933 06/14/19 1210 10/21/19 1302  AST _0 ALT _1 ALKPHOS  --   --  78  BILITOT 0.6 0.4 0.9  PROT 6.5 6.2 6.8  ALBUMIN  --   --  3.6   No results for input(s): LIPASE, AMYLASE in the last 8760 hours. No results for input(s): AMMONIA in the last 8760 hours. CBC: Recent Labs    06/14/19 1210 08/05/19 1215 10/21/19 1319 10/21/19 1319 10/22/19 0435 10/23/19 0743 11/03/19 0953  WBC 6.0   < > 5.1   < > 3.5* 4.0 6.2  NEUTROABS 4,824  --  4.2  --   --  2.8  --   HGB 13.5   < > 13.5   < > 12.8 13.8 13.5  HCT 40.0   < > 40.2   < > 38.3 41.5 40.7  MCV 97.6   < > 97.1   < > 97.2 97.6 97.6  PLT 203   < > 147*   < > 139* 168 222   < > = values in this interval not displayed.   Lipid Panel: Recent Labs    06/14/19 1210  CHOL 146  HDL 42*  LDLCALC 83  TRIG 110  CHOLHDL 3.5   TSH: Recent Labs    03/08/19 0933 10/21/19 1946  TSH 1.86 1.234   A1C: Lab Results  Component Value Date   HGBA1C 6.2 (H) 10/21/2019     Assessment/Plan 1. Mass of upper lobe of right lung Plans to follow up PET scan with oncologist next week, looking towards hospice care at this time.   2. Advance care planning -discussed and completed MOST form -DNR on file.   3. Dementia without behavioral disturbance, unspecified dementia type ([place]) -advanced dementia at this time. Husband and pt have focused on comfort measures even prior to lung cancer diagnosis.    Next appt: would like next appt to be done virtually Sylvain Hasten K. Harle Battiest  [facility] [phone]    Virtual Visit via Video Note  I connected with [name] on 11/24/19 at 11:00 AM EDT by a video enabled telemedicine application and verified that I am speaking with the correct person using two identifiers.  Location: Patient: home Provider: office   I discussed the limitations of evaluation and management by telemedicine and the availability of in person  appointments. The patient expressed understanding and agreed to proceed.    I discussed the assessment and treatment plan with the patient. The patient was provided an opportunity to ask questions and all were answered. The patient agreed with the plan and demonstrated an understanding of the instructions.   The patient was advised to call back or seek an in-person evaluation if the symptoms worsen or if the condition fails to improve as anticipated.  I provided  25 minutes of non-face-to-face time during this encounter.  Carlos American. Dewaine Oats, AGNP Avs printed and mailed.

## 2019-11-29 ENCOUNTER — Encounter: Payer: Self-pay | Admitting: Hematology and Oncology

## 2019-11-29 NOTE — Progress Notes (Signed)
Patient Care Team: Lauree Chandler, NP as PCP - General (Geriatric Medicine) Larey Dresser, MD as PCP - Advanced Heart Failure (Cardiology) Nicholas Lose, MD as Consulting Physician (Hematology and Oncology) Eppie Gibson, MD as Attending Physician (Radiation Oncology) Fanny Skates, MD as Consulting Physician (General Surgery) Nicholas Lose, MD as Consulting Physician (Hematology and Oncology)  DIAGNOSIS:    ICD-10-CM   1. 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, estrogen receptor positive ([place])  06/23/2017 Initial Diagnosis   Left breast 11 o'clock position 6.3 x 4.2 x 6.1 cm mass with thick papillary projections, 2 adjacent left axillary lymph nodes; right breast 930 position 1.7 cm mass; left breast and left lymph node biopsy revealed IDC grade 3, ER 5%, PR 0%, Ki-67 50%, HER-2 positive ratio 2.04 T3N1 stage IIIa; right breast: Grade 1 IDC  ER 95%, PR 95%, Ki-67 15%, HER-2 negative; T1CN0 stage I a   08/27/2017 Surgery   Right simple mastectomy: IDC grade 1, 1.1 cm, DCIS low-grade, margins negative, 0/2 lymph nodes negative; ER 95%, PR 95%, HER-2 negative Ki-67 15% T1 cN0 stage I a left simple mastectomy: IDC grade 3, 6.2 cm, 4/13 lymph nodes positive, margins negative, ER 5 %, PR 0%, HER-2 positive ratio 2.04, Ki-67 50 % T3 N2 stage IIIa   10/14/2017 - 09/2018 Chemotherapy   Adjuvant Herceptin alone for 1 year   09/2017 -  Anti-estrogen oral therapy   Letrozole daily   10/23/2017 - 11/25/2017 Radiation Therapy   Adjuvant radiation therapy   12/05/2017 Genetic Testing   The Common Hereditary Cancer Panel offered by [facility] includes sequencing and/or deletion duplication testing of the following 47 genes: APC, ATM, AXIN2, BARD1, BMPR1A, BRCA1, BRCA2, BRIP1, CDH1, CDKN2A  (p14ARF), CDKN2A (p16INK4a), CKD4, CHEK2, CTNNA1, DICER1, EPCAM (Deletion/duplication testing only), GREM1 (promoter region deletion/duplication testing only), KIT, MEN1, MLH1, MSH2, MSH3, MSH6, MUTYH, NBN, NF1, NHTL1, PALB2, PDGFRA, PMS2, POLD1, POLE, PTEN, RAD50, RAD51C, RAD51D, SDHB, SDHC, SDHD, SMAD4, SMARCA4. STK11, TP53, TSC1, TSC2, and VHL.  The following genes were evaluated for sequence changes only: SDHA and HOXB13 c.251G>A variant only.  Results: Negative, no pathogenic variants identified.  The date of this test report is 12/05/2017.    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  CK7, GA TA 3, CK 5 and 6+, immunostains negative for ER/PR.  Ki-67 60%, HER-2 equivocal by IHC FISH pending     CHIEF COMPLIANT: Follow-up of metastatic breast cancer to discuss treatment plan  INTERVAL HISTORY: Charlene Rubio is a 75 y.o. with above-mentioned history of metastatic breast cancer with pulmonary metastases. PET scan on 11/22/19 showed metastases in the right hilum, right lung, liver, left iliac wing, and proximal left femur. She presents to the clinic todayto discuss her scan and further treatment.  Patient is accompanied by her husband who is the primary caregiver and her daughter.  She is pleasantly demented.  She is not complaining of any severe pain.  ALLERGIES:  is allergic to chlorhexidine.  MEDICATIONS:  Current Outpatient Medications  Medication Sig Dispense Refill  . B-D UF III MINI PEN NEEDLES 31G X 5 MM MISC 1 EACH BY DOES NOT APPLY ROUTE 2 (TWO) TIMES DAILY. USE TWICE DAILY  WITH INSULIN PEN. DX: E11.9 100 each 11  . Blood Glucose Monitoring Suppl (ONE TOUCH ULTRA 2) w/Device KIT Use to test blood sugar 2 times  daily DX: E11.9 1 kit 0  . carvedilol (COREG) 6.25 MG tablet Take 1 tablet (6.25 mg total) by mouth 2 (two) times daily. 60 tablet 0  . Continuous Blood Gluc Receiver  (DEXCOM G6 RECEIVER) DEVI 1 Device by Does not apply route as directed. (Patient not taking: Reported on 11/24/2019) 1 Device 0  . Continuous Blood Gluc Sensor (DEXCOM G6 SENSOR) MISC 1 Device by Does not apply route as directed. (Patient not taking: Reported on 11/24/2019) 3 each 6  . Continuous Blood Gluc Transmit (DEXCOM G6 TRANSMITTER) MISC 1 Device by Does not apply route as directed. (Patient not taking: Reported on 11/24/2019) 1 each 3  . donepezil (ARICEPT) 10 MG tablet TAKE 1 TABLET BY MOUTH AT  BEDTIME 90 tablet 1  . ELIQUIS 5 MG TABS tablet TAKE 1 TABLET BY MOUTH TWICE A DAY 60 tablet 5  . ENTRESTO 24-26 MG TAKE 1 TABLET BY MOUTH TWICE A DAY 60 tablet 5  . furosemide (LASIX) 80 MG tablet TAKE 1 TABLET BY MOUTH  DAILY 90 tablet 3  . glucose blood (ONETOUCH VERIO) test strip One Touch Verio. Use to test blood sugar twice daily Dx: E11.9 100 each 3  . Insulin Lispro Prot & Lispro (HUMALOG MIX 75/25 KWIKPEN) (75-25) 100 UNIT/ML Kwikpen Inject 12 Units into the skin 2 (two) times daily with a meal. 15 mL 11  . KLOR-CON M20 20 MEQ tablet TAKE 1 TABLET BY MOUTH EVERY DAY 90 tablet 1  . letrozole (FEMARA) 2.5 MG tablet Take 1 tablet (2.5 mg total) by mouth daily. 90 tablet 3  . memantine (NAMENDA) 10 MG tablet Take 1 tablet (10 mg total) by mouth 2 (two) times daily. 180 tablet 1  . OneTouch Delica Lancets 36O MISC Use as instructed to test blood sugar 2 times daily DX: E11.9 100 each 0  . saccharomyces boulardii (FLORASTOR) 250 MG capsule Take 1 capsule (250 mg total) by mouth 2 (two) times daily. 60 capsule 2  . spironolactone (ALDACTONE) 25 MG tablet TAKE 1/2 TABLET BY MOUTH EVERY DAY 45 tablet 7   No current facility-administered medications for this visit.    PHYSICAL EXAMINATION: ECOG PERFORMANCE STATUS: 1 - Symptomatic but completely ambulatory  Vitals:   11/30/19 1124  BP: (!) 128/56  Pulse: 67  Resp: 18  Temp: 98.5 F (36.9 C)  SpO2: 97%   Filed Weights   11/30/19 1124  Weight:  180 lb 12.8 oz (82 kg)    LABORATORY DATA:  I have reviewed the data as listed CMP Latest Ref Rng & Units 10/23/2019 10/22/2019 10/21/2019  Glucose 70 - 99 mg/dL 171(H) 129(H) 126(H)  BUN 8 - 23 mg/dL '20 18 21  ' Creatinine 0.44 - 1.00 mg/dL 0.76 0.67 0.77  Sodium 135 - 145 mmol/L 139 140 139  Potassium 3.5 - 5.1 mmol/L 4.0 4.0 4.5  Chloride 98 - 111 mmol/L 106 107 107  CO2 22 - 32 mmol/L '24 24 24  ' Calcium 8.9 - 10.3 mg/dL 8.8(L) 8.8(L) 8.9  Total Protein 6.5 - 8.1 g/dL - - 6.8  Total Bilirubin 0.3 - 1.2 mg/dL - - 0.9  Alkaline Phos 38 - 126 U/L - - 78  AST 15 - 41 U/L - - 18  ALT 0 - 44 U/L - - 12    Lab Results  Component Value Date  WBC 6.2 11/03/2019   HGB 13.5 11/03/2019   HCT 40.7 11/03/2019   MCV 97.6 11/03/2019   PLT 222 11/03/2019   NEUTROABS 2.8 10/23/2019    ASSESSMENT & PLAN:  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 Adjuvant Herceptinstarted 2/26/19completed 09/14/2018 2. Adjuvant radiation therapy 10/23/17-11/25/17 3. adjuvant antiestrogen therapystarted February 2019 --------------------------------------------------  Hospitalization: 10/21/2019-10/24/2019: Admitted with sudden onset of lower extremity weakness, incidentally found to have lung mass CT chest 10/21/2019: 4.3 cm solid right upper lobe mass abutting the major fissure, minor fissure and lateral pleural margin without chest wall invasion.  There is associated right hilar and paratracheal adenopathy, bilateral adrenal masses are stable from before  11/03/2019: Lung biopsy right upper  lobe: High-grade non-small cell carcinoma consistent with breast primary ER is focally positive.  CK7, GA TA 3, CK 5 and 6+, immunostains negative for ER/PR.  Ki-67 60%, HER-2 equivocal by IHC FISH negative.  11/22/2019: PET CT scan: Hypermetabolic metastatic disease involving the mediastinum, right hilum, right lung, liver, left iliac wing and proximal left femur, bilateral adrenal adenomas Caris molecular testing: Pending  Counseling: I discussed with her that chemotherapy would be the best treatment option but given her generalized health and performance status I am concerned about tolerance.    After discussing back-and-forth we determined that hospice care is the ideal approach for her care.  She will discontinue letrozole therapy since it is not working anyway.  Hospice would provide the patient and her family the most assistance with managing her through the end of her life.  Return to clinic on an as-needed basis.   No orders of the defined types were placed in this encounter.  The patient has a good understanding of the overall plan. she agrees with it. she will call with any problems that may develop before the next visit here.  Total time spent: 30 mins including face to face time and time spent for planning, charting and coordination of care  Nicholas Lose, MD 11/30/2019  I, Cloyde Reams Dorshimer, am acting as scribe for Dr. Nicholas Lose.  I have reviewed the above documentation for accuracy and completeness, and I agree with the above.

## 2019-11-30 ENCOUNTER — Telehealth: Payer: Self-pay

## 2019-11-30 ENCOUNTER — Other Ambulatory Visit: Payer: Self-pay

## 2019-11-30 ENCOUNTER — Inpatient Hospital Stay: Payer: Medicare Other | Attending: Hematology and Oncology | Admitting: Hematology and Oncology

## 2019-11-30 DIAGNOSIS — C787 Secondary malignant neoplasm of liver and intrahepatic bile duct: Secondary | ICD-10-CM | POA: Diagnosis not present

## 2019-11-30 DIAGNOSIS — Z9013 Acquired absence of bilateral breasts and nipples: Secondary | ICD-10-CM | POA: Insufficient documentation

## 2019-11-30 DIAGNOSIS — F0390 Unspecified dementia without behavioral disturbance: Secondary | ICD-10-CM | POA: Insufficient documentation

## 2019-11-30 DIAGNOSIS — Z170 Estrogen receptor positive status [ER+]: Secondary | ICD-10-CM

## 2019-11-30 DIAGNOSIS — C773 Secondary and unspecified malignant neoplasm of axilla and upper limb lymph nodes: Secondary | ICD-10-CM | POA: Insufficient documentation

## 2019-11-30 DIAGNOSIS — E279 Disorder of adrenal gland, unspecified: Secondary | ICD-10-CM | POA: Diagnosis not present

## 2019-11-30 DIAGNOSIS — C50812 Malignant neoplasm of overlapping sites of left female breast: Secondary | ICD-10-CM | POA: Diagnosis not present

## 2019-11-30 DIAGNOSIS — C50811 Malignant neoplasm of overlapping sites of right female breast: Secondary | ICD-10-CM | POA: Insufficient documentation

## 2019-11-30 DIAGNOSIS — C7801 Secondary malignant neoplasm of right lung: Secondary | ICD-10-CM | POA: Diagnosis not present

## 2019-11-30 NOTE — Telephone Encounter (Signed)
RN spoke with Amy at [facility], [place], [place].    Verbal orders given for Hospice consult per MD recommendations.  Per Amy, no additional faxed information needed at this time.  Direct contact information provided.

## 2019-11-30 NOTE — Assessment & Plan Note (Signed)
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 Adjuvant Herceptinstarted 2/26/19completed 09/14/2018 2. Adjuvant radiation therapy 10/23/17-11/25/17 3. adjuvant antiestrogen therapystarted February 2019 --------------------------------------------------  Hospitalization: 10/21/2019-10/24/2019: Admitted with sudden onset of lower extremity weakness, incidentally found to have lung mass CT chest 10/21/2019: 4.3 cm solid right upper lobe mass abutting the major fissure, minor fissure and lateral pleural margin without chest wall invasion.  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  CK7, GA TA 3, CK 5 and 6+, immunostains negative for ER/PR.  Ki-67 60%, HER-2 equivocal by IHC FISH negative.  11/22/2019: PET CT scan: Hypermetabolic metastatic disease involving the mediastinum, right hilum, right lung, liver, left iliac wing and proximal left femur, bilateral adrenal adenomas Caris molecular testing: Pending  Counseling: I discussed with her that chemotherapy would be the best treatment option but given her generalized health and performance status I am concerned about tolerance.  Since the biopsy suggested that ER was focally positive we can consider giving her antiestrogen therapy but the likelihood of benefit will be very low. Supportive care and hospice are reasonable as well.

## 2019-12-01 ENCOUNTER — Encounter (HOSPITAL_COMMUNITY): Payer: Self-pay

## 2019-12-01 DIAGNOSIS — C50212 Malignant neoplasm of upper-inner quadrant of left female breast: Secondary | ICD-10-CM | POA: Diagnosis not present

## 2019-12-02 MED ORDER — ASPIRIN EC 81 MG PO TBEC: 81.0000 mg | DELAYED_RELEASE_TABLET | Freq: Every day | ORAL | 3 refills | Status: AC

## 2019-12-02 MED ORDER — LOSARTAN POTASSIUM 25 MG PO TABS: 25.0000 mg | ORAL_TABLET | Freq: Every day | ORAL | 3 refills | Status: AC

## 2019-12-02 NOTE — Telephone Encounter (Signed)
-----   Message from Larey Dresser, MD sent at 12/01/2019  4:53 PM EDT ----- She is going on hospice.   Stop Eliquis and Entresto.   Start aspirin 81 mg daily and losartan 25 mg daily.   Does not need followup appt.

## 2019-12-02 NOTE — Telephone Encounter (Deleted)
-----   Message from Larey Dresser, MD sent at 12/01/2019  4:53 PM EDT ----- She is going on hospice.   Stop Eliquis and Entresto.   Start aspirin 81 mg daily and losartan 25 mg daily.   Does not need followup appt.

## 2019-12-02 NOTE — Telephone Encounter (Signed)
Med list updated to reflect changes,rx sent in. Tried calling patients husband,Charlene Rubio. No answer,unable to leave message due to vm being full.

## 2019-12-13 ENCOUNTER — Telehealth: Payer: Medicare Other | Admitting: Nurse Practitioner

## 2019-12-15 ENCOUNTER — Encounter (HOSPITAL_COMMUNITY): Payer: Self-pay | Admitting: Hematology and Oncology

## 2020-01-20 ENCOUNTER — Telehealth: Payer: Self-pay

## 2020-01-20 ENCOUNTER — Encounter: Payer: Self-pay | Admitting: Nurse Practitioner

## 2020-01-20 MED ORDER — ONETOUCH DELICA LANCETS 33G MISC: 0 refills | Status: AC

## 2020-01-20 MED ORDER — ONETOUCH VERIO VI STRP: ORAL_STRIP | 3 refills | Status: AC

## 2020-01-20 MED ORDER — INSULIN NPH ISOPHANE & REGULAR (70-30) 100 UNIT/ML ~~LOC~~ SUSP: 12.0000 [IU] | Freq: Two times a day (BID) | SUBCUTANEOUS | 2 refills | Status: AC

## 2020-01-20 NOTE — Telephone Encounter (Signed)
-   Discontinue Humalog insulin per Hospice request  - Start on NPH 100 units/ml insulin inject 12 units SQ twice daily with a meal.

## 2020-01-20 NOTE — Telephone Encounter (Signed)
Incoming call received from [place] (RN with Hospice) questioning if patients Humalog can be changed for it is not on the formulary with Hospice, otherwise patient will have to pay out of pocket and the cost is very expensive  Patient on Hospice x 8 weeks or longer and average fasting blood sugar is 130-140  Alternatives include:  1.) NPH (would need to send script for medication, syringes, and needles)  2.) Semglee   Please send selected option to [facility] and Call [place] back with an update on what was decided.  Ria Comment is aware that Lauree Chandler, NP is out of office and message will be sent to covering provider, Marlowe Sax, NP

## 2020-01-20 NOTE — Telephone Encounter (Signed)
I spoke with Charlene Rubio Comment update her on the changes. New script was sent in to [facility] on [address].

## 2020-01-21 ENCOUNTER — Other Ambulatory Visit: Payer: Self-pay | Admitting: *Deleted

## 2020-01-21 MED ORDER — INSULIN SYRINGES (DISPOSABLE) U-100 1 ML MISC: 6 refills | Status: AC

## 2020-02-23 ENCOUNTER — Encounter: Payer: Medicare Other | Admitting: Nurse Practitioner

## 2020-02-23 ENCOUNTER — Encounter: Payer: Medicare Other | Admitting: Family

## 2020-04-20 NOTE — Assessment & Plan Note (Deleted)
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 Adjuvant Herceptinstarted 2/26/19completed 09/14/2018 2. Adjuvant radiation therapy 10/23/17-11/25/17 3. adjuvant antiestrogen therapystarted February 2019 -------------------------------------------------- Hospitalization: 10/21/2019-10/24/2019: Admitted with sudden onset of lower extremity weakness, incidentally found to have lung mass CT chest 10/21/2019: 4.3 cm solid right upper lobe mass abutting the major fissure, minor fissure and lateral pleural margin without chest wall invasion. 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 CK7, GA TA 3, CK 5 and 6+, immunostains negative for ER/PR. Ki-67 60%, HER-2 equivocal by IHC FISH negative.  11/22/2019: PET CT scan: Hypermetabolic metastatic disease involving the mediastinum, right hilum, right lung, liver, left iliac wing and proximal left femur, bilateral adrenal adenomas  Current plan: [facility]

## 2020-04-21 ENCOUNTER — Inpatient Hospital Stay: Payer: Medicare Other | Attending: Hematology and Oncology | Admitting: Hematology and Oncology

## 2020-04-25 ENCOUNTER — Telehealth: Payer: Self-pay | Admitting: *Deleted

## 2020-04-25 NOTE — Telephone Encounter (Signed)
I'm sorry to hear about her diagnosis for lung cancer. - Hold Novalin Insulin if patient does not eat if she continues to decline may discontinue insulin - discontinue Potassium chloride since spironolactone is a potassium sparing diuretic. - continue with Furosemide and Spironolactone for now but may discontinue if unable to swallow medication and notify provider.

## 2020-04-25 NOTE — Telephone Encounter (Signed)
Ria Comment notified and agreed. Stated that they will reassess weekly.

## 2020-04-25 NOTE — Telephone Encounter (Signed)
Ria Comment with [facility]osp[facility] called and stated that she is calling with an update on patient.  Stated that patient has Metastatic Lung Cancer and has been declining over the last 4 weeks. Stated that she is sleeping most of the time and Only eating 1 meal a day.   Currently patient is on Novalin once a day since she is only eating one meal/day.  Nurse is wanting to know how long should they continue the insulin?   Also husband is still given Furosemide and Spironolactone but not the Potassium because she cannot swallow the pill. Wants to know if these medications should be continued also.   Please Advise.

## 2020-05-29 ENCOUNTER — Telehealth: Payer: Self-pay

## 2020-05-29 NOTE — Telephone Encounter (Signed)
Ria Comment [phone]) from [facility]osp[facility] called and stated that patient was sleeping about 18 hours a day and only eating bites to 1/4 of a meal all day. She is on BID insulin. Ria Comment states she is only getting one dose before a meal, but that sugars are trending down. Ria Comment is asking what to do about the insulin. She didn't give me any CBGs.

## 2020-05-29 NOTE — Telephone Encounter (Signed)
Called Charlene Rubio Comment back and gave her the recommendations. She stated they would try recommendations and decide if a virtual visit would be helpful.

## 2020-05-29 NOTE — Telephone Encounter (Signed)
Would have her cut the dose in half and change to daily and check blood sugars randomly (one day fasting, one day 2 hours after meal, one day before bed) to make sure blood sugars are stable. Can make appt for virtual visit if needed

## 2020-06-14 ENCOUNTER — Other Ambulatory Visit (HOSPITAL_COMMUNITY): Payer: Self-pay | Admitting: Cardiology

## 2020-06-25 ENCOUNTER — Other Ambulatory Visit: Payer: Self-pay | Admitting: Internal Medicine

## 2020-07-19 DEATH — deceased
